# Patient Record
Sex: Female | Born: 1990 | Race: Black or African American | Hispanic: No | Marital: Single | Smoking: Former smoker
Health system: Southern US, Community
[De-identification: ages and names within clinical notes are randomized; demographics above are authoritative.]

## PROBLEM LIST (undated history)

## (undated) ENCOUNTER — Inpatient Hospital Stay: Payer: Self-pay

## (undated) DIAGNOSIS — F32A Depression, unspecified: Secondary | ICD-10-CM

## (undated) DIAGNOSIS — E669 Obesity, unspecified: Secondary | ICD-10-CM

## (undated) DIAGNOSIS — G43909 Migraine, unspecified, not intractable, without status migrainosus: Secondary | ICD-10-CM

## (undated) DIAGNOSIS — Z803 Family history of malignant neoplasm of breast: Secondary | ICD-10-CM

## (undated) DIAGNOSIS — R000 Tachycardia, unspecified: Secondary | ICD-10-CM

## (undated) DIAGNOSIS — F419 Anxiety disorder, unspecified: Secondary | ICD-10-CM

## (undated) DIAGNOSIS — O99013 Anemia complicating pregnancy, third trimester: Secondary | ICD-10-CM

## (undated) HISTORY — PX: NO PAST SURGERIES: SHX2092

## (undated) HISTORY — DX: Migraine, unspecified, not intractable, without status migrainosus: G43.909

## (undated) HISTORY — DX: Depression, unspecified: F32.A

## (undated) HISTORY — DX: Anemia complicating pregnancy, third trimester: O99.013

## (undated) HISTORY — DX: Anxiety disorder, unspecified: F41.9

## (undated) HISTORY — PX: WISDOM TOOTH EXTRACTION: SHX21

## (undated) HISTORY — DX: Family history of malignant neoplasm of breast: Z80.3

---

## 2010-12-12 ENCOUNTER — Emergency Department (HOSPITAL_COMMUNITY)
Admission: EM | Admit: 2010-12-12 | Discharge: 2010-12-12 | Disposition: A | Discharge status: Home / Self Care | Payer: Self-pay | Attending: Emergency Medicine | Admitting: Emergency Medicine

## 2010-12-12 DIAGNOSIS — R0989 Other specified symptoms and signs involving the circulatory and respiratory systems: Secondary | ICD-10-CM | POA: Insufficient documentation

## 2010-12-12 DIAGNOSIS — R0609 Other forms of dyspnea: Secondary | ICD-10-CM | POA: Insufficient documentation

## 2010-12-12 DIAGNOSIS — R0789 Other chest pain: Secondary | ICD-10-CM | POA: Insufficient documentation

## 2010-12-12 DIAGNOSIS — J45901 Unspecified asthma with (acute) exacerbation: Secondary | ICD-10-CM | POA: Insufficient documentation

## 2011-01-18 ENCOUNTER — Emergency Department (HOSPITAL_COMMUNITY)
Admission: EM | Admit: 2011-01-18 | Discharge: 2011-01-18 | Disposition: A | Discharge status: Home / Self Care | Payer: Self-pay | Attending: Emergency Medicine | Admitting: Emergency Medicine

## 2011-01-18 ENCOUNTER — Emergency Department (HOSPITAL_COMMUNITY): Payer: Self-pay

## 2011-01-18 DIAGNOSIS — R0789 Other chest pain: Secondary | ICD-10-CM | POA: Insufficient documentation

## 2011-01-18 DIAGNOSIS — R5381 Other malaise: Secondary | ICD-10-CM | POA: Insufficient documentation

## 2011-01-18 DIAGNOSIS — R0602 Shortness of breath: Secondary | ICD-10-CM | POA: Insufficient documentation

## 2011-01-18 DIAGNOSIS — J45901 Unspecified asthma with (acute) exacerbation: Secondary | ICD-10-CM | POA: Insufficient documentation

## 2011-02-18 ENCOUNTER — Emergency Department (HOSPITAL_COMMUNITY)
Admission: EM | Admit: 2011-02-18 | Discharge: 2011-02-18 | Disposition: A | Discharge status: Home / Self Care | Payer: Self-pay | Attending: Emergency Medicine | Admitting: Emergency Medicine

## 2011-02-18 DIAGNOSIS — R0602 Shortness of breath: Secondary | ICD-10-CM | POA: Insufficient documentation

## 2011-02-18 DIAGNOSIS — R0789 Other chest pain: Secondary | ICD-10-CM | POA: Insufficient documentation

## 2011-02-18 DIAGNOSIS — J45901 Unspecified asthma with (acute) exacerbation: Secondary | ICD-10-CM | POA: Insufficient documentation

## 2011-03-18 ENCOUNTER — Emergency Department (HOSPITAL_COMMUNITY)
Admission: EM | Admit: 2011-03-18 | Discharge: 2011-03-18 | Disposition: A | Discharge status: Home / Self Care | Payer: Self-pay | Attending: Emergency Medicine | Admitting: Emergency Medicine

## 2011-03-18 ENCOUNTER — Encounter: Payer: Self-pay | Admitting: *Deleted

## 2011-03-18 ENCOUNTER — Emergency Department (HOSPITAL_COMMUNITY): Payer: Self-pay

## 2011-03-18 DIAGNOSIS — R079 Chest pain, unspecified: Secondary | ICD-10-CM | POA: Insufficient documentation

## 2011-03-18 DIAGNOSIS — R0602 Shortness of breath: Secondary | ICD-10-CM | POA: Insufficient documentation

## 2011-03-18 DIAGNOSIS — J45909 Unspecified asthma, uncomplicated: Secondary | ICD-10-CM | POA: Insufficient documentation

## 2011-03-18 MED ORDER — PREDNISONE 20 MG PO TABS: 40.0000 mg | ORAL_TABLET | Freq: Every day | ORAL | Status: AC

## 2011-03-18 MED ORDER — ALBUTEROL SULFATE (5 MG/ML) 0.5% IN NEBU
2.5000 mg | INHALATION_SOLUTION | Freq: Once | RESPIRATORY_TRACT | Status: AC
  Administered 2011-03-18: 2.5 mg via RESPIRATORY_TRACT

## 2011-03-18 MED ORDER — PREDNISONE 20 MG PO TABS
60.0000 mg | ORAL_TABLET | Freq: Once | ORAL | Status: AC
  Administered 2011-03-18: 60 mg via ORAL
  Filled 2011-03-18: qty 3

## 2011-03-18 MED ORDER — IPRATROPIUM BROMIDE 0.02 % IN SOLN
0.5000 mg | Freq: Once | RESPIRATORY_TRACT | Status: AC
  Administered 2011-03-18: 0.5 mg via RESPIRATORY_TRACT

## 2011-03-18 MED ORDER — ALBUTEROL SULFATE (5 MG/ML) 0.5% IN NEBU
INHALATION_SOLUTION | RESPIRATORY_TRACT | Status: AC
  Filled 2011-03-18: qty 1

## 2011-03-18 MED ORDER — ALBUTEROL SULFATE (5 MG/ML) 0.5% IN NEBU
5.0000 mg | INHALATION_SOLUTION | Freq: Once | RESPIRATORY_TRACT | Status: AC
  Administered 2011-03-18: 5 mg via RESPIRATORY_TRACT
  Filled 2011-03-18: qty 1

## 2011-03-18 MED ORDER — IPRATROPIUM BROMIDE 0.02 % IN SOLN
RESPIRATORY_TRACT | Status: AC
  Filled 2011-03-18: qty 2.5

## 2011-03-18 MED ORDER — ALBUTEROL SULFATE (5 MG/ML) 0.5% IN NEBU
5.0000 mg | INHALATION_SOLUTION | Freq: Once | RESPIRATORY_TRACT | Status: AC
  Administered 2011-03-18: 5 mg via RESPIRATORY_TRACT
  Filled 2011-03-18: qty 0.5

## 2011-03-18 MED ORDER — IPRATROPIUM BROMIDE 0.02 % IN SOLN
0.5000 mg | Freq: Once | RESPIRATORY_TRACT | Status: AC
  Administered 2011-03-18: 0.5 mg via RESPIRATORY_TRACT
  Filled 2011-03-18: qty 2.5

## 2011-03-18 MED ORDER — ALBUTEROL SULFATE HFA 108 (90 BASE) MCG/ACT IN AERS
2.0000 | INHALATION_SPRAY | RESPIRATORY_TRACT | Status: DC | PRN
  Administered 2011-03-18: 2 via RESPIRATORY_TRACT
  Filled 2011-03-18: qty 6.7

## 2011-03-18 NOTE — ED Notes (Signed)
Breathing improved, pt to waiting room

## 2011-03-18 NOTE — ED Provider Notes (Signed)
History     CSN: [id number] Arrival date & time: 03/18/2011  2:49 AM   First MD Initiated Contact with Patient 03/18/11 [phone]      Chief Complaint  Patient presents with  . Asthma    (Consider location/radiation/quality/duration/timing/severity/associated sxs/prior treatment) Patient is a 20 y.o. female presenting with asthma. The history is provided by the patient.  Asthma This is a recurrent problem. The current episode started 1 to 2 hours ago. The problem occurs constantly. The problem has been gradually worsening. Associated symptoms include chest pain and shortness of breath. The symptoms are aggravated by walking and exertion. The symptoms are relieved by nothing (ran out of inhaler at home.). She has tried nothing for the symptoms.    Past Medical History  Diagnosis Date  . Asthma     History reviewed. No pertinent past surgical history.  History reviewed. No pertinent family history.  History  Substance Use Topics  . Smoking status: Never Smoker   . Smokeless tobacco: Not on file  . Alcohol Use: No    OB History    Grav Para Term Preterm Abortions TAB SAB Ect Mult Living                  Review of Systems  Respiratory: Positive for shortness of breath.   Cardiovascular: Positive for chest pain.  All other systems reviewed and are negative.    Allergies  Review of patient's allergies indicates no known allergies.  Home Medications  No current outpatient prescriptions on file.  BP 115/72  Pulse 80  Temp(Src) 98.6 F (37 C) (Oral)  Resp 16  SpO2 100%  LMP 03/15/2011  Physical Exam  Nursing note and vitals reviewed. Constitutional: She is oriented to person, place, and time. She appears well-developed and well-nourished. No distress.  HENT:  Head: Normocephalic and atraumatic.  Right Ear: Tympanic membrane and ear canal normal.  Left Ear: Tympanic membrane and ear canal normal.  Eyes: EOM are normal. Pupils are equal, round, and reactive to  light.  Cardiovascular: Normal rate, regular rhythm, normal heart sounds and intact distal pulses.  Exam reveals no friction rub.   No murmur heard. Pulmonary/Chest: Effort normal. She has wheezes. She has no rales.  Abdominal: Soft. Bowel sounds are normal. She exhibits no distension. There is no tenderness. There is no rebound and no guarding.  Musculoskeletal: Normal range of motion. She exhibits no tenderness.       No edema  Neurological: She is alert and oriented to person, place, and time. No cranial nerve deficit.  Skin: Skin is warm and dry. No rash noted.  Psychiatric: She has a normal mood and affect. Her behavior is normal.    ED Course  Procedures (including critical care time)  Labs Reviewed - No data to display Dg Chest 2 View  03/18/2011  *RADIOLOGY REPORT*  Clinical Data: Shortness of breath, wheezing.  CHEST - 2 VIEW  Comparison: 01/18/2011  Findings: Lungs are clear. No pleural effusion or pneumothorax. The cardiomediastinal contours are within normal limits. The visualized bones and soft tissues are without significant appreciable abnormality.  IMPRESSION: No acute cardiopulmonary process.  Original Report Authenticated By: Waneta Martins, M.D.     1. Asthma       MDM   Pt with typical asthma exacerbation  symptoms.  No infectious sx, productive cough or other complaints.  Wheezing on exam.  will give steroids, albuterol/atrovent and recheck.  6:18 AM CXR wnl.  On re-eval pt feeling much  better.  Will d/c home.       Gwyneth Sprout, MD 03/18/11 [phone]

## 2011-03-18 NOTE — ED Notes (Signed)
Pt in c/o asthma attack x30 min, pt states she is out of home inhailer

## 2011-07-03 ENCOUNTER — Emergency Department (HOSPITAL_COMMUNITY)
Admission: EM | Admit: 2011-07-03 | Discharge: 2011-07-04 | Disposition: A | Discharge status: Home Health | Payer: Self-pay | Attending: Emergency Medicine | Admitting: Emergency Medicine

## 2011-07-03 DIAGNOSIS — R059 Cough, unspecified: Secondary | ICD-10-CM | POA: Insufficient documentation

## 2011-07-03 DIAGNOSIS — R0602 Shortness of breath: Secondary | ICD-10-CM | POA: Insufficient documentation

## 2011-07-03 DIAGNOSIS — R05 Cough: Secondary | ICD-10-CM | POA: Insufficient documentation

## 2011-07-03 DIAGNOSIS — J45901 Unspecified asthma with (acute) exacerbation: Secondary | ICD-10-CM | POA: Insufficient documentation

## 2011-07-03 DIAGNOSIS — R079 Chest pain, unspecified: Secondary | ICD-10-CM | POA: Insufficient documentation

## 2011-07-04 ENCOUNTER — Encounter (HOSPITAL_COMMUNITY): Payer: Self-pay | Admitting: Emergency Medicine

## 2011-07-04 MED ORDER — PREDNISONE 20 MG PO TABS: ORAL_TABLET | ORAL | Status: AC

## 2011-07-04 MED ORDER — PREDNISONE 20 MG PO TABS
60.0000 mg | ORAL_TABLET | Freq: Once | ORAL | Status: AC
  Administered 2011-07-04: 60 mg via ORAL
  Filled 2011-07-04: qty 3

## 2011-07-04 MED ORDER — IPRATROPIUM BROMIDE 0.02 % IN SOLN
0.5000 mg | Freq: Once | RESPIRATORY_TRACT | Status: AC
  Administered 2011-07-04: 0.5 mg via RESPIRATORY_TRACT
  Filled 2011-07-04: qty 2.5

## 2011-07-04 MED ORDER — ALBUTEROL SULFATE (5 MG/ML) 0.5% IN NEBU
2.5000 mg | INHALATION_SOLUTION | Freq: Once | RESPIRATORY_TRACT | Status: AC
  Administered 2011-07-04: 2.5 mg via RESPIRATORY_TRACT
  Filled 2011-07-04: qty 0.5

## 2011-07-04 MED ORDER — OXYCODONE HCL 5 MG PO TABS
5.0000 mg | ORAL_TABLET | Freq: Once | ORAL | Status: AC
  Administered 2011-07-04: 5 mg via ORAL
  Filled 2011-07-04: qty 1

## 2011-07-04 MED ORDER — TRAMADOL HCL 50 MG PO TABS: 50.0000 mg | ORAL_TABLET | Freq: Four times a day (QID) | ORAL | Status: AC | PRN

## 2011-07-04 MED ORDER — ACETAMINOPHEN 325 MG PO TABS
650.0000 mg | ORAL_TABLET | Freq: Once | ORAL | Status: AC
  Administered 2011-07-04: 650 mg via ORAL
  Filled 2011-07-04: qty 2

## 2011-07-04 MED ORDER — ALBUTEROL SULFATE HFA 108 (90 BASE) MCG/ACT IN AERS
2.0000 | INHALATION_SPRAY | RESPIRATORY_TRACT | Status: DC | PRN
  Administered 2011-07-04: 2 via RESPIRATORY_TRACT
  Filled 2011-07-04: qty 6.7

## 2011-07-04 NOTE — ED Notes (Addendum)
Pt presented to the ER with c/o difficulty of breathing, states has Hx of Asthma, noted wheezing in the upper bilateral lobes, pt also reports pain the lowe back, ribs and states "it hurts to move anyway" Pt able to communicate, noted productive cough, pt states recent cold

## 2011-07-04 NOTE — ED Provider Notes (Signed)
History     CSN: [id number]  Arrival date & time 07/03/11  2350   First MD Initiated Contact with Patient 07/04/11 [phone]      Chief Complaint  Patient presents with  . Asthma  . Shortness of Breath  . Respiratory Distress    (Consider location/radiation/quality/duration/timing/severity/associated sxs/prior treatment) HPI Comments: Patient with a history of asthma comes in tonight with shortness of breath, difficulty moving her chest wall to breathe, stating it hurts.  She states she used an albuterol inhaler once yesterday.  She has not been on steroids in a long time.  Denies fever, trauma, previous intubations.  He does not have a local physician and she was just discharged from the service  Patient is a 21 y.o. female presenting with asthma and shortness of breath. The history is provided by the patient.  Asthma This is a recurrent problem. The current episode started today. The problem occurs constantly. The problem has been gradually worsening. Associated symptoms include chest pain and coughing. Pertinent negatives include no congestion or weakness.  Shortness of Breath  Associated symptoms include chest pain, cough, shortness of breath and wheezing. Pertinent negatives include no rhinorrhea. Her past medical history is significant for asthma.    Past Medical History  Diagnosis Date  . Asthma     History reviewed. No pertinent past surgical history.  History reviewed. No pertinent family history.  History  Substance Use Topics  . Smoking status: Never Smoker   . Smokeless tobacco: Not on file  . Alcohol Use: No    OB History    Grav Para Term Preterm Abortions TAB SAB Ect Mult Living                  Review of Systems  HENT: Negative for congestion and rhinorrhea.   Respiratory: Positive for cough, shortness of breath and wheezing.   Cardiovascular: Positive for chest pain.  Neurological: Negative for dizziness and weakness.    Allergies  Shellfish  allergy  Home Medications   Current Outpatient Rx  Name Route Sig Dispense Refill  . ALBUTEROL SULFATE HFA 108 (90 BASE) MCG/ACT IN AERS Inhalation Inhale 2 puffs into the lungs every 6 (six) hours as needed.      Marland Kitchen PREDNISONE 20 MG PO TABS  3 Tabs PO Days 1-3, then 2 tabs PO Days 4-6, then 1 tab PO Day 7-9, then Half Tab PO Day 10-12 20 tablet 0  . TRAMADOL HCL 50 MG PO TABS Oral Take 1 tablet (50 mg total) by mouth every 6 (six) hours as needed for pain. 15 tablet 0    BP 126/71  Pulse 95  Temp(Src) 98 F (36.7 C) (Oral)  Resp 16  Ht 5\' 4"  (1.626 m)  Wt 197 lb 6.4 oz (89.54 kg)  BMI 33.88 kg/m2  SpO2 96%  LMP 07/02/2011  Physical Exam  Constitutional: She appears well-developed and well-nourished.  HENT:  Head: Normocephalic.  Eyes: Pupils are equal, round, and reactive to light.  Cardiovascular: Normal rate.   Pulmonary/Chest: Accessory muscle usage present. No respiratory distress. She has decreased breath sounds.       Moving very little air high pitched end expiratory wheeze in the right upper lobe    ED Course  Procedures (including critical care time)  Labs Reviewed - No data to display No results found.   1. Asthma exacerbation     2:38 AM patient reassessed.  She is moving more air.  Do not hear a wheezing at  this time.  She still having significant chest discomfort with deep inspiration.  That was not relieved with Tylenol.  We'll try giving her 5 mg oxycodone and reassess with an additional albuterol treatment  MDM  Asthma exacerbation 6:01 AM patient able to fill her lungs are completely no longer using.  Will discharge him with an albuterol inhaler prescription for steroids and something for pain       Arman Filter, NP 07/04/11 [phone]

## 2011-07-04 NOTE — ED Provider Notes (Signed)
Medical screening examination/treatment/procedure(s) were performed by non-physician practitioner and as supervising physician I was immediately available for consultation/collaboration.  Chawn Spraggins R. Bretton Tandy, MD 07/04/11 0743 

## 2011-07-04 NOTE — ED Notes (Signed)
Pt given Albuterol MDI and given instruction in use with return demonstration.

## 2011-07-04 NOTE — Discharge Instructions (Signed)
Asthma Attack Prevention HOW CAN ASTHMA BE PREVENTED? Currently, there is no way to prevent asthma from starting. However, you can take steps to control the disease and prevent its symptoms after you have been diagnosed. Learn about your asthma and how to control it. Take an active role to control your asthma by working with your caregiver to create and follow an asthma action plan. An asthma action plan guides you in taking your medicines properly, avoiding factors that make your asthma worse, tracking your level of asthma control, responding to worsening asthma, and seeking emergency care when needed. To track your asthma, keep records of your symptoms, check your peak flow number using a peak flow meter (handheld device that shows how well air moves out of your lungs), and get regular asthma checkups.  Other ways to prevent asthma attacks include:  Use medicines as your caregiver directs.   Identify and avoid things that make your asthma worse (as much as you can).   Keep track of your asthma symptoms and level of control.   Get regular checkups for your asthma.   With your caregiver, write a detailed plan for taking medicines and managing an asthma attack. Then be sure to follow your action plan. Asthma is an ongoing condition that needs regular monitoring and treatment.   Identify and avoid asthma triggers. A number of outdoor allergens and irritants (pollen, mold, cold air, air pollution) can trigger asthma attacks. Find out what causes or makes your asthma worse, and take steps to avoid those triggers (see below).   Monitor your breathing. Learn to recognize warning signs of an attack, such as slight coughing, wheezing or shortness of breath. However, your lung function may already decrease before you notice any signs or symptoms, so regularly measure and record your peak airflow with a home peak flow meter.   Identify and treat attacks early. If you act quickly, you're less likely to have  a severe attack. You will also need less medicine to control your symptoms. When your peak flow measurements decrease and alert you to an upcoming attack, take your medicine as instructed, and immediately stop any activity that may have triggered the attack. If your symptoms do not improve, get medical help.   Pay attention to increasing quick-relief inhaler use. If you find yourself relying on your quick-relief inhaler (such as albuterol), your asthma is not under control. See your caregiver about adjusting your treatment.  IDENTIFY AND CONTROL FACTORS THAT MAKE YOUR ASTHMA WORSE A number of common things can set off or make your asthma symptoms worse (asthma triggers). Keep track of your asthma symptoms for several weeks, detailing all the environmental and emotional factors that are linked with your asthma. When you have an asthma attack, go back to your asthma diary to see which factor, or combination of factors, might have contributed to it. Once you know what these factors are, you can take steps to control many of them.  Allergies: If you have allergies and asthma, it is important to take asthma prevention steps at home. Asthma attacks (worsening of asthma symptoms) can be triggered by allergies, which can cause temporary increased inflammation of your airways. Minimizing contact with the substance to which you are allergic will help prevent an asthma attack. Animal Dander:   Some people are allergic to the flakes of skin or dried saliva from animals with fur or feathers. Keep these pets out of your home.   If you can't keep a pet outdoors, keep the   pet out of your bedroom and other sleeping areas at all times, and keep the door closed.   Remove carpets and furniture covered with cloth from your home. If that is not possible, keep the pet away from fabric-covered furniture and carpets.  Dust Mites:  Many people with asthma are allergic to dust mites. Dust mites are tiny bugs that are found in  every home, in mattresses, pillows, carpets, fabric-covered furniture, bedcovers, clothes, stuffed toys, fabric, and other fabric-covered items.   Cover your mattress in a special dust-proof cover.   Cover your pillow in a special dust-proof cover, or wash the pillow each week in hot water. Water must be hotter than 130 F to kill dust mites. Cold or warm water used with detergent and bleach can also be effective.   Wash the sheets and blankets on your bed each week in hot water.   Try not to sleep or lie on cloth-covered cushions.   Call ahead when traveling and ask for a smoke-free hotel room. Bring your own bedding and pillows, in case the hotel only supplies feather pillows and down comforters, which may contain dust mites and cause asthma symptoms.   Remove carpets from your bedroom and those laid on concrete, if you can.   Keep stuffed toys out of the bed, or wash the toys weekly in hot water or cooler water with detergent and bleach.  Cockroaches:  Many people with asthma are allergic to the droppings and remains of cockroaches.   Keep food and garbage in closed containers. Never leave food out.   Use poison baits, traps, powders, gels, or paste (for example, boric acid).   If a spray is used to kill cockroaches, stay out of the room until the odor goes away.  Indoor Mold:  Fix leaky faucets, pipes, or other sources of water that have mold around them.   Clean moldy surfaces with a cleaner that has bleach in it.  Pollen and Outdoor Mold:  When pollen or mold spore counts are high, try to keep your windows closed.   Stay indoors with windows closed from late morning to afternoon, if you can. Pollen and some mold spore counts are highest at that time.   Ask your caregiver whether you need to take or increase anti-inflammatory medicine before your allergy season starts.  Irritants:   Tobacco smoke is an irritant. If you smoke, ask your caregiver how you can quit. Ask family  members to quit smoking, too. Do not allow smoking in your home or car.   If possible, do not use a wood-burning stove, kerosene heater, or fireplace. Minimize exposure to all sources of smoke, including incense, candles, fires, and fireworks.   Try to stay away from strong odors and sprays, such as perfume, talcum powder, hair spray, and paints.   Decrease humidity in your home and use an indoor air cleaning device. Reduce indoor humidity to below 60 percent. Dehumidifiers or central air conditioners can do this.   Try to have someone else vacuum for you once or twice a week, if you can. Stay out of rooms while they are being vacuumed and for a short while afterward.   If you vacuum, use a dust mask from a hardware store, a double-layered or microfilter vacuum cleaner bag, or a vacuum cleaner with a HEPA filter.   Sulfites in foods and beverages can be irritants. Do not drink beer or wine, or eat dried fruit, processed potatoes, or shrimp if they cause asthma   symptoms.   Cold air can trigger an asthma attack. Cover your nose and mouth with a scarf on cold or windy days.   Several health conditions can make asthma more difficult to manage, including runny nose, sinus infections, reflux disease, psychological stress, and sleep apnea. Your caregiver will treat these conditions, as well.   Avoid close contact with people who have a cold or the flu, since your asthma symptoms may get worse if you catch the infection from them. Wash your hands thoroughly after touching items that may have been handled by people with a respiratory infection.   Get a flu shot every year to protect against the flu virus, which often makes asthma worse for days or weeks. Also get a pneumonia shot once every five to 10 years.  Drugs:  Aspirin and other painkillers can cause asthma attacks. 10% to 20% of people with asthma have sensitivity to aspirin or a group of painkillers called non-steroidal anti-inflammatory drugs  (NSAIDS), such as ibuprofen and naproxen. These drugs are used to treat pain and reduce fevers. Asthma attacks caused by any of these medicines can be severe and even fatal. These drugs must be avoided in people who have known aspirin sensitive asthma. Products with acetaminophen are considered safe for people who have asthma. It is important that people with aspirin sensitivity read labels of all over-the-counter drugs used to treat pain, colds, coughs, and fever.   Beta blockers and ACE inhibitors are other drugs which you should discuss with your caregiver, in relation to your asthma.  ALLERGY SKIN TESTING  Ask your asthma caregiver about allergy skin testing or blood testing (RAST test) to identify the allergens to which you are sensitive. If you are found to have allergies, allergy shots (immunotherapy) for asthma may help prevent future allergies and asthma. With allergy shots, small doses of allergens (substances to which you are allergic) are injected under your skin on a regular schedule. Over a period of time, your body may become used to the allergen and less responsive with asthma symptoms. You can also take measures to minimize your exposure to those allergens. EXERCISE  If you have exercise-induced asthma, or are planning vigorous exercise, or exercise in cold, humid, or dry environments, prevent exercise-induced asthma by following your caregiver's advice regarding asthma treatment before exercising. Document Released: 04/02/2009 Document Revised: 04/03/2011 Document Reviewed: 04/02/2009 [facility] Patient Information 2012 [place], [place]. Use the supplied inhaler every 6 hours 2 puffs for 2 days and then as needed.  Please continue taking prednisone as directed until completed, even given a prescription for Ultram that he can use for pain

## 2011-08-11 ENCOUNTER — Emergency Department (HOSPITAL_COMMUNITY)
Admission: EM | Admit: 2011-08-11 | Discharge: 2011-08-12 | Disposition: A | Discharge status: Home / Self Care | Payer: Self-pay | Attending: Emergency Medicine | Admitting: Emergency Medicine

## 2011-08-11 ENCOUNTER — Encounter (HOSPITAL_COMMUNITY): Payer: Self-pay | Admitting: Emergency Medicine

## 2011-08-11 DIAGNOSIS — R05 Cough: Secondary | ICD-10-CM | POA: Insufficient documentation

## 2011-08-11 DIAGNOSIS — R070 Pain in throat: Secondary | ICD-10-CM | POA: Insufficient documentation

## 2011-08-11 DIAGNOSIS — J069 Acute upper respiratory infection, unspecified: Secondary | ICD-10-CM | POA: Insufficient documentation

## 2011-08-11 DIAGNOSIS — R059 Cough, unspecified: Secondary | ICD-10-CM | POA: Insufficient documentation

## 2011-08-11 DIAGNOSIS — R509 Fever, unspecified: Secondary | ICD-10-CM | POA: Insufficient documentation

## 2011-08-11 DIAGNOSIS — J45901 Unspecified asthma with (acute) exacerbation: Secondary | ICD-10-CM | POA: Insufficient documentation

## 2011-08-11 NOTE — ED Notes (Signed)
Pt states she has been sick since Friday, ran a fever on Saturday, and today has been having problems with her asthma  Pt states her inhaler is empty at home

## 2011-08-12 ENCOUNTER — Emergency Department (HOSPITAL_COMMUNITY): Payer: Self-pay

## 2011-08-12 MED ORDER — PREDNISONE 20 MG PO TABS
60.0000 mg | ORAL_TABLET | Freq: Once | ORAL | Status: AC
  Administered 2011-08-12: 60 mg via ORAL
  Filled 2011-08-12: qty 3

## 2011-08-12 MED ORDER — PREDNISONE 10 MG PO TABS: 20.0000 mg | ORAL_TABLET | Freq: Every day | ORAL | Status: DC

## 2011-08-12 MED ORDER — ALBUTEROL SULFATE (5 MG/ML) 0.5% IN NEBU
5.0000 mg | INHALATION_SOLUTION | Freq: Once | RESPIRATORY_TRACT | Status: AC
  Administered 2011-08-12: 5 mg via RESPIRATORY_TRACT
  Filled 2011-08-12: qty 1

## 2011-08-12 MED ORDER — ALBUTEROL SULFATE HFA 108 (90 BASE) MCG/ACT IN AERS: 1.0000 | INHALATION_SPRAY | Freq: Four times a day (QID) | RESPIRATORY_TRACT | Status: DC | PRN

## 2011-08-12 MED ORDER — ALBUTEROL SULFATE HFA 108 (90 BASE) MCG/ACT IN AERS
2.0000 | INHALATION_SPRAY | RESPIRATORY_TRACT | Status: DC | PRN
  Administered 2011-08-12: 2 via RESPIRATORY_TRACT
  Filled 2011-08-12: qty 6.7

## 2011-08-12 NOTE — ED Provider Notes (Signed)
History     CSN: [id number]  Arrival date & time 08/11/11  2328   First MD Initiated Contact with Patient 08/11/11 2359      Chief Complaint  Patient presents with  . Asthma    (Consider location/radiation/quality/duration/timing/severity/associated sxs/prior treatment) HPI History provided by the patient. She lives locally does not have a primary care physician. Having on-off low-grade fevers the last few days with increasing cough and wheezing. She is a nonsmoker. She has ran out of her inhaler and is requesting referral to primary care clinic and inhaler tonight. Some sore throat. No nausea vomiting or diarrhea. No abdominal pain. No back pain or chest pain. Symptoms mild/moderate in severity. No known triggers. Patient denies history of admissions or intubation for asthma in the past. Past Medical History  Diagnosis Date  . Asthma     History reviewed. No pertinent past surgical history.  Family History  Problem Relation Age of Onset  . Cancer Other   . Diabetes Other     History  Substance Use Topics  . Smoking status: Never Smoker   . Smokeless tobacco: Not on file  . Alcohol Use: No    OB History    Grav Para Term Preterm Abortions TAB SAB Ect Mult Living                  Review of Systems  Constitutional: Positive for fever. Negative for chills.  HENT: Negative for neck pain and neck stiffness.   Eyes: Negative for pain.  Respiratory: Positive for cough and wheezing.   Cardiovascular: Negative for chest pain.  Gastrointestinal: Negative for abdominal pain.  Genitourinary: Negative for dysuria.  Musculoskeletal: Negative for back pain.  Skin: Negative for rash.  Neurological: Negative for headaches.  All other systems reviewed and are negative.    Allergies  Shellfish allergy  Home Medications   Current Outpatient Rx  Name Route Sig Dispense Refill  . ALBUTEROL SULFATE HFA 108 (90 BASE) MCG/ACT IN AERS Inhalation Inhale 2 puffs into the lungs  every 6 (six) hours as needed.      . IBUPROFEN 200 MG PO TABS Oral Take 200 mg by mouth every 6 (six) hours as needed.      BP 133/85  Pulse 107  Temp(Src) 99.2 F (37.3 C) (Oral)  Resp 20  SpO2 98%  LMP 07/21/2011  Physical Exam  Constitutional: She is oriented to person, place, and time. She appears well-developed and well-nourished.  HENT:  Head: Normocephalic and atraumatic.  Eyes: Conjunctivae and EOM are normal. Pupils are equal, round, and reactive to light.  Neck: Trachea normal. Neck supple. No thyromegaly present.  Cardiovascular: Normal rate, regular rhythm, S1 normal, S2 normal and normal pulses.     No systolic murmur is present   No diastolic murmur is present  Pulses:      Radial pulses are 2+ on the right side, and 2+ on the left side.  Pulmonary/Chest: She has no rhonchi. She has no rales. She exhibits no tenderness.       Good air movement with mild expiratory wheezes bilaterally. No respiratory distress.  Abdominal: Soft. Normal appearance and bowel sounds are normal. There is no tenderness. There is no CVA tenderness and negative Murphy's sign.  Musculoskeletal:       BLE:s Calves nontender, no cords or erythema, negative Homans sign  Neurological: She is alert and oriented to person, place, and time. She has normal strength. No cranial nerve deficit or sensory deficit. GCS eye  subscore is 4. GCS verbal subscore is 5. GCS motor subscore is 6.  Skin: Skin is warm and dry. No rash noted. She is not diaphoretic.  Psychiatric: Her speech is normal.       Cooperative and appropriate    ED Course  Procedures (including critical care time)  Labs Reviewed - No data to display Dg Chest 2 View  08/12/2011  *RADIOLOGY REPORT*  Clinical Data: Shortness of breath; history of asthma.  CHEST - 2 VIEW  Comparison: Chest radiograph performed 05/19/2010  Findings: The lungs are well-aerated and clear.  There is no evidence of focal opacification, pleural effusion or  pneumothorax.  The heart is normal in size; the mediastinal contour is within normal limits.  No acute osseous abnormalities are seen.  IMPRESSION: No acute cardiopulmonary process seen.  Original Report Authenticated By: Tonia Ghent, M.D.    Albuterol treatment and prednisone provided. Chest x-ray obtained and reviewed as above. Symptoms improving and NAD on recheck with wheezing resolved. Patient stable for discharge home.   MDM   Asthma attack with history of same. Has associated fever and likely URI. Improve in ED in stable for discharge home with URI precautions, albuterol inhaler and primary care referral        Sunnie Nielsen, MD 08/12/11 [phone]

## 2011-08-12 NOTE — ED Notes (Signed)
Pt c/o SOB and chest tightness x3 days. Pt has hx of asthma. Pt states she was recently sick with cold-like symptoms. Pt states sitting down and relaxing is her only relief to these symptoms.

## 2011-08-12 NOTE — Discharge Instructions (Signed)
Upper Respiratory Infection, Adult  An upper respiratory infection (URI) is also sometimes known as the common cold. The upper respiratory tract includes the nose, sinuses, throat, trachea, and bronchi. Bronchi are the airways leading to the lungs. Most people improve within 1 week, but symptoms can last up to 2 weeks. A residual cough may last even longer.  CAUSES  Many different viruses can infect the tissues lining the upper respiratory tract. The tissues become irritated and inflamed and often become very moist. Mucus production is also common. A cold is contagious. You can easily spread the virus to others by oral contact. This includes kissing, sharing a glass, coughing, or sneezing. Touching your mouth or nose and then touching a surface, which is then touched by another person, can also spread the virus.  SYMPTOMS  Symptoms typically develop 1 to 3 days after you come in contact with a cold virus. Symptoms vary from person to person. They may include:  Runny nose.  Sneezing.  Nasal congestion.  Sinus irritation.  Sore throat.  Loss of voice (laryngitis).  Cough.  Fatigue.  Muscle aches.  Loss of appetite.  Headache.  Low-grade fever.  DIAGNOSIS  You might diagnose your own cold based on familiar symptoms, since most people get a cold 2 to 3 times a year. Your caregiver can confirm this based on your exam. Most importantly, your caregiver can check that your symptoms are not due to another disease such as strep throat, sinusitis, pneumonia, asthma, or epiglottitis. Blood tests, throat tests, and X-rays are not necessary to diagnose a common cold, but they may sometimes be helpful in excluding other more serious diseases. Your caregiver will decide if any further tests are required.  RISKS AND COMPLICATIONS  You may be at risk for a more severe case of the common cold if you smoke cigarettes, have chronic heart disease (such as heart failure) or lung disease (such as asthma), or if you  have a weakened immune system. The very young and very old are also at risk for more serious infections. Bacterial sinusitis, middle ear infections, and bacterial pneumonia can complicate the common cold. The common cold can worsen asthma and chronic obstructive pulmonary disease (COPD). Sometimes, these complications can require emergency medical care and may be life-threatening.  PREVENTION  The best way to protect against getting a cold is to practice good hygiene. Avoid oral or hand contact with people with cold symptoms. Wash your hands often if contact occurs. There is no clear evidence that vitamin C, vitamin E, echinacea, or exercise reduces the chance of developing a cold. However, it is always recommended to get plenty of rest and practice good nutrition.  TREATMENT  Treatment is directed at relieving symptoms. There is no cure. Antibiotics are not effective, because the infection is caused by a virus, not by bacteria. Treatment may include:  Increased fluid intake. Sports drinks offer valuable electrolytes, sugars, and fluids.  Breathing heated mist or steam (vaporizer or shower).  Eating chicken soup or other clear broths, and maintaining good nutrition.  Getting plenty of rest.  Using gargles or lozenges for comfort.  Controlling fevers with ibuprofen or acetaminophen as directed by your caregiver.  Increasing usage of your inhaler if you have asthma.  Zinc gel and zinc lozenges, taken in the first 24 hours of the common cold, can shorten the duration and lessen the severity of symptoms. Pain medicines may help with fever, muscle aches, and throat pain. A variety of non-prescription medicines are available  to treat congestion and runny nose. Your caregiver can make recommendations and may suggest nasal or lung inhalers for other symptoms.  HOME CARE INSTRUCTIONS  Only take over-the-counter or prescription medicines for pain, discomfort, or fever as directed by your caregiver.  Use a warm  mist humidifier or inhale steam from a shower to increase air moisture. This may keep secretions moist and make it easier to breathe.  Drink enough water and fluids to keep your urine clear or pale yellow.  Rest as needed.  Return to work when your temperature has returned to normal or as your caregiver advises. You may need to stay home longer to avoid infecting others. You can also use a face mask and careful hand washing to prevent spread of the virus.  SEEK MEDICAL CARE IF:  After the first few days, you feel you are getting worse rather than better.  You need your caregiver's advice about medicines to control symptoms.  You develop chills, worsening shortness of breath, or brown or red sputum. These may be signs of pneumonia.  You develop yellow or brown nasal discharge or pain in the face, especially when you bend forward. These may be signs of sinusitis.  You develop a fever, swollen neck glands, pain with swallowing, or white areas in the back of your throat. These may be signs of strep throat.  SEEK IMMEDIATE MEDICAL CARE IF:  You have a fever.  You develop severe or persistent headache, ear pain, sinus pain, or chest pain.  You develop wheezing, a prolonged cough, cough up blood, or have a change in your usual mucus (if you have chronic lung disease).  You develop sore muscles or a stiff neck.   RESOURCE GUIDE  Dental Problems  Patients with Medicaid: [facility] [phone]                                           [phone] [address] Phone:  [phone]                                                  Phone:  [phone]  If unable to pay or uninsured, contact:  [facility] or [facility]. to become qualified for the adult dental clinic.  Chronic Pain Problems Contact [facility]  [phone] Patients need to be referred by their primary care doctor.  Insufficient Money for  Medicine Contact [employer]:  call "211" or [facility] [phone].  No Primary Care Doctor Call [facility]  [phone] Other agencies that provide inexpensive medical care    [facility]  [phone]    [facility]  [phone]    [facility]  [phone]    [facility]  [phone]    [facility]  [phone]    [facility]  [phone]

## 2012-07-28 ENCOUNTER — Emergency Department: Payer: Self-pay | Admitting: Emergency Medicine

## 2012-08-25 ENCOUNTER — Emergency Department: Payer: Self-pay | Admitting: Internal Medicine

## 2012-08-25 LAB — URINALYSIS, COMPLETE
Bilirubin,UR: NEGATIVE
Ph: 5 (ref 4.5–8.0)
RBC,UR: 23 /HPF (ref 0–5)
Specific Gravity: 1.013 (ref 1.003–1.030)
WBC UR: 314 /HPF (ref 0–5)

## 2013-01-30 ENCOUNTER — Emergency Department: Payer: Self-pay | Admitting: Emergency Medicine

## 2013-03-08 ENCOUNTER — Emergency Department: Payer: Self-pay | Admitting: Emergency Medicine

## 2013-04-14 ENCOUNTER — Emergency Department: Payer: Self-pay | Admitting: Emergency Medicine

## 2013-06-02 ENCOUNTER — Emergency Department: Payer: Self-pay | Admitting: Emergency Medicine

## 2013-06-02 LAB — CBC WITH DIFFERENTIAL/PLATELET
BASOS ABS: 0 10*3/uL (ref 0.0–0.1)
BASOS PCT: 0.6 %
EOS PCT: 1.8 %
Eosinophil #: 0.1 10*3/uL (ref 0.0–0.7)
HCT: 37.7 % (ref 35.0–47.0)
HGB: 12.6 g/dL (ref 12.0–16.0)
LYMPHS ABS: 1.8 10*3/uL (ref 1.0–3.6)
Lymphocyte %: 24.8 %
MCH: 28.4 pg (ref 26.0–34.0)
MCHC: 33.5 g/dL (ref 32.0–36.0)
MCV: 85 fL (ref 80–100)
MONO ABS: 0.3 x10 3/mm (ref 0.2–0.9)
Monocyte %: 4.6 %
NEUTROS ABS: 4.8 10*3/uL (ref 1.4–6.5)
NEUTROS PCT: 68.2 %
PLATELETS: 256 10*3/uL (ref 150–440)
RBC: 4.45 10*6/uL (ref 3.80–5.20)
RDW: 16.5 % — ABNORMAL HIGH (ref 11.5–14.5)
WBC: 7.1 10*3/uL (ref 3.6–11.0)

## 2013-06-02 LAB — URINALYSIS, COMPLETE
Bacteria: NONE SEEN
Bilirubin,UR: NEGATIVE
Blood: NEGATIVE
Glucose,UR: NEGATIVE mg/dL (ref 0–75)
Ketone: NEGATIVE
LEUKOCYTE ESTERASE: NEGATIVE
Nitrite: NEGATIVE
Ph: 5 (ref 4.5–8.0)
Protein: NEGATIVE
RBC,UR: <1 /HPF (ref 0–5)
SPECIFIC GRAVITY: 1.026 (ref 1.003–1.030)
WBC UR: <1 /HPF (ref 0–5)

## 2013-06-02 LAB — BASIC METABOLIC PANEL
ANION GAP: 6 — AB (ref 7–16)
BUN: 16 mg/dL (ref 7–18)
CHLORIDE: 105 mmol/L (ref 98–107)
Calcium, Total: 8.8 mg/dL (ref 8.5–10.1)
Co2: 26 mmol/L (ref 21–32)
Creatinine: 0.83 mg/dL (ref 0.60–1.30)
GLUCOSE: 93 mg/dL (ref 65–99)
OSMOLALITY: 275 (ref 275–301)
Potassium: 3.4 mmol/L — ABNORMAL LOW (ref 3.5–5.1)
Sodium: 137 mmol/L (ref 136–145)

## 2013-08-06 ENCOUNTER — Emergency Department: Payer: Self-pay | Admitting: Emergency Medicine

## 2013-08-22 ENCOUNTER — Emergency Department: Payer: Self-pay | Admitting: Emergency Medicine

## 2013-08-22 LAB — URINALYSIS, COMPLETE
BILIRUBIN, UR: NEGATIVE
Bacteria: NONE SEEN
Glucose,UR: NEGATIVE mg/dL (ref 0–75)
KETONE: NEGATIVE
Leukocyte Esterase: NEGATIVE
NITRITE: NEGATIVE
PH: 5 (ref 4.5–8.0)
Protein: NEGATIVE
RBC,UR: 4 /HPF (ref 0–5)
Specific Gravity: 1.026 (ref 1.003–1.030)
Squamous Epithelial: 2
WBC UR: 1 /HPF (ref 0–5)

## 2013-08-22 LAB — CBC
HCT: 34.5 % — ABNORMAL LOW (ref 35.0–47.0)
HGB: 11.6 g/dL — AB (ref 12.0–16.0)
MCH: 29 pg (ref 26.0–34.0)
MCHC: 33.5 g/dL (ref 32.0–36.0)
MCV: 87 fL (ref 80–100)
PLATELETS: 209 10*3/uL (ref 150–440)
RBC: 3.99 10*6/uL (ref 3.80–5.20)
RDW: 16 % — ABNORMAL HIGH (ref 11.5–14.5)
WBC: 8.6 10*3/uL (ref 3.6–11.0)

## 2013-08-22 LAB — HCG, QUANTITATIVE, PREGNANCY: Beta Hcg, Quant.: 83947 m[IU]/mL — ABNORMAL HIGH

## 2013-08-29 LAB — HM PAP SMEAR: HM Pap smear: NEGATIVE

## 2013-10-09 ENCOUNTER — Emergency Department: Payer: Self-pay | Admitting: Internal Medicine

## 2013-10-09 LAB — CBC
HCT: 33.3 % — ABNORMAL LOW (ref 35.0–47.0)
HGB: 11.2 g/dL — ABNORMAL LOW (ref 12.0–16.0)
MCH: 29.5 pg (ref 26.0–34.0)
MCHC: 33.6 g/dL (ref 32.0–36.0)
MCV: 88 fL (ref 80–100)
Platelet: 182 10*3/uL (ref 150–440)
RBC: 3.8 10*6/uL (ref 3.80–5.20)
RDW: 16.1 % — ABNORMAL HIGH (ref 11.5–14.5)
WBC: 7.3 10*3/uL (ref 3.6–11.0)

## 2013-10-09 LAB — BASIC METABOLIC PANEL
Anion Gap: 7 (ref 7–16)
BUN: 7 mg/dL (ref 7–18)
Calcium, Total: 8.8 mg/dL (ref 8.5–10.1)
Chloride: 105 mmol/L (ref 98–107)
Co2: 26 mmol/L (ref 21–32)
Creatinine: 0.66 mg/dL (ref 0.60–1.30)
EGFR (African American): >60
EGFR (Non-African Amer.): >60
Glucose: 78 mg/dL (ref 65–99)
Osmolality: 273 (ref 275–301)
Potassium: 3.5 mmol/L (ref 3.5–5.1)
SODIUM: 138 mmol/L (ref 136–145)

## 2013-12-16 ENCOUNTER — Emergency Department: Payer: Self-pay | Admitting: Student

## 2013-12-16 LAB — BASIC METABOLIC PANEL
Anion Gap: 8 (ref 7–16)
BUN: 8 mg/dL (ref 7–18)
CALCIUM: 8.1 mg/dL — AB (ref 8.5–10.1)
CHLORIDE: 107 mmol/L (ref 98–107)
CO2: 24 mmol/L (ref 21–32)
CREATININE: 0.72 mg/dL (ref 0.60–1.30)
EGFR (African American): >60
Glucose: 71 mg/dL (ref 65–99)
OSMOLALITY: 274 (ref 275–301)
POTASSIUM: 3.8 mmol/L (ref 3.5–5.1)
Sodium: 139 mmol/L (ref 136–145)

## 2013-12-16 LAB — CBC
HCT: 35.5 % (ref 35.0–47.0)
HGB: 11.7 g/dL — AB (ref 12.0–16.0)
MCH: 30.8 pg (ref 26.0–34.0)
MCHC: 33 g/dL (ref 32.0–36.0)
MCV: 93 fL (ref 80–100)
PLATELETS: 187 10*3/uL (ref 150–440)
RBC: 3.82 10*6/uL (ref 3.80–5.20)
RDW: 15.5 % — AB (ref 11.5–14.5)
WBC: 9.8 10*3/uL (ref 3.6–11.0)

## 2014-01-29 ENCOUNTER — Emergency Department: Payer: Self-pay | Admitting: Emergency Medicine

## 2014-01-29 LAB — CBC WITH DIFFERENTIAL/PLATELET
BASOS ABS: 0.1 10*3/uL (ref 0.0–0.1)
BASOS PCT: 0.5 %
EOS ABS: 0.1 10*3/uL (ref 0.0–0.7)
Eosinophil %: 0.8 %
HCT: 32.2 % — ABNORMAL LOW (ref 35.0–47.0)
HGB: 10.6 g/dL — AB (ref 12.0–16.0)
LYMPHS ABS: 1.6 10*3/uL (ref 1.0–3.6)
Lymphocyte %: 16 %
MCH: 31 pg (ref 26.0–34.0)
MCHC: 32.8 g/dL (ref 32.0–36.0)
MCV: 95 fL (ref 80–100)
MONO ABS: 0.6 x10 3/mm (ref 0.2–0.9)
MONOS PCT: 6 %
Neutrophil #: 7.6 10*3/uL — ABNORMAL HIGH (ref 1.4–6.5)
Neutrophil %: 76.7 %
Platelet: 168 10*3/uL (ref 150–440)
RBC: 3.4 10*6/uL — ABNORMAL LOW (ref 3.80–5.20)
RDW: 15.1 % — AB (ref 11.5–14.5)
WBC: 9.9 10*3/uL (ref 3.6–11.0)

## 2014-01-29 LAB — BASIC METABOLIC PANEL
ANION GAP: 6 — AB (ref 7–16)
BUN: 9 mg/dL (ref 7–18)
CALCIUM: 7.8 mg/dL — AB (ref 8.5–10.1)
CHLORIDE: 107 mmol/L (ref 98–107)
CO2: 25 mmol/L (ref 21–32)
CREATININE: 0.68 mg/dL (ref 0.60–1.30)
EGFR (African American): >60
Glucose: 92 mg/dL (ref 65–99)
Osmolality: 274 (ref 275–301)
Potassium: 3.5 mmol/L (ref 3.5–5.1)
Sodium: 138 mmol/L (ref 136–145)

## 2014-01-29 LAB — TROPONIN I: Troponin-I: <0.02 ng/mL

## 2014-02-13 ENCOUNTER — Emergency Department: Payer: Self-pay | Admitting: Emergency Medicine

## 2014-03-07 ENCOUNTER — Observation Stay: Payer: Self-pay

## 2014-03-07 LAB — URINALYSIS, COMPLETE
BLOOD: NEGATIVE
Bilirubin,UR: NEGATIVE
Glucose,UR: NEGATIVE mg/dL (ref 0–75)
Ketone: NEGATIVE
Leukocyte Esterase: NEGATIVE
NITRITE: NEGATIVE
Ph: 6 (ref 4.5–8.0)
Protein: NEGATIVE
RBC,UR: 2 /HPF (ref 0–5)
SPECIFIC GRAVITY: 1.012 (ref 1.003–1.030)
WBC UR: 5 /HPF (ref 0–5)

## 2014-03-08 LAB — URINE CULTURE

## 2014-03-18 ENCOUNTER — Observation Stay: Payer: Self-pay | Admitting: Obstetrics and Gynecology

## 2014-04-07 ENCOUNTER — Observation Stay: Payer: Self-pay | Admitting: Obstetrics and Gynecology

## 2014-04-08 ENCOUNTER — Inpatient Hospital Stay: Payer: Self-pay | Admitting: Obstetrics & Gynecology

## 2014-04-08 ENCOUNTER — Observation Stay: Payer: Self-pay | Admitting: Obstetrics & Gynecology

## 2014-04-08 LAB — CBC WITH DIFFERENTIAL/PLATELET
BASOS ABS: 0 10*3/uL (ref 0.0–0.1)
Basophil %: 0.2 %
EOS ABS: 0.1 10*3/uL (ref 0.0–0.7)
EOS PCT: 0.8 %
HCT: 35.5 % (ref 35.0–47.0)
HGB: 11.9 g/dL — AB (ref 12.0–16.0)
Lymphocyte #: 1.3 10*3/uL (ref 1.0–3.6)
Lymphocyte %: 11.7 %
MCH: 31.6 pg (ref 26.0–34.0)
MCHC: 33.5 g/dL (ref 32.0–36.0)
MCV: 94 fL (ref 80–100)
Monocyte #: 0.7 x10 3/mm (ref 0.2–0.9)
Monocyte %: 6.9 %
NEUTROS ABS: 8.6 10*3/uL — AB (ref 1.4–6.5)
NEUTROS PCT: 80.4 %
PLATELETS: 172 10*3/uL (ref 150–440)
RBC: 3.77 10*6/uL — ABNORMAL LOW (ref 3.80–5.20)
RDW: 15.2 % — AB (ref 11.5–14.5)
WBC: 10.7 10*3/uL (ref 3.6–11.0)

## 2014-04-08 LAB — GC/CHLAMYDIA PROBE AMP

## 2014-04-10 LAB — HEMATOCRIT: HCT: 33.2 % — AB (ref 35.0–47.0)

## 2014-09-05 NOTE — H&P (Signed)
L&D Evaluation:  History:  HPI Pt is a 24 yo G2P0010 [facility] 624[facility]d [facility] to L&D with pelvic pressure.  Was evaluated yesterday on L&D for constipation and given a regimen including Miralax.  She had a BM again last night but still has pressure.  +FM, no LOF, no VB, some contractions.   Is on po iron had not tried anything for constipation   Her prenatal care is also significant for BMI >35. She is A+, RI, VI, and has received her flu and tdap vaccine.   Presents with back pain, decreased fetal movement, nausea/vomiting   Patient's Medical History No Chronic Illness   Medications Pre Natal Vitamins   Allergies shellfish   Family History Non-Contributory   Exam:  Vital Signs stable   Urine Protein not completed   General no apparent distress   Mental Status clear   Chest clear   Heart normal sinus rhythm   Abdomen gravid, non-tender   Estimated Fetal Weight Average for gestational age   Fetal Position cephalic by leopolds   Pelvic 3/long/0 posterior   Mebranes Intact   FHT normal rate with no decels   Ucx irregular   Skin dry   Plan:  Comments 23yo G2P0010 @ 39.1 with irregular contractions and constipation.  No cervical change, no LOF VB +FM.  Labor description reviewed with patient.  Will await sponatneous labor with cervical change or SROM.  Return if either happen; otherwise keep next scheduled appointment.   Follow Up Appointment already scheduled   Electronic Signatures: Ward, Elenora Fenderhelsea C (MD)  (Signed 12-Dec-15 12:13)  Authored: L&D Evaluation   Last Updated: 12-Dec-15 12:13 by Ward, Elenora Fenderhelsea C (MD)

## 2014-09-05 NOTE — H&P (Signed)
L&D Evaluation:  History:  HPI Pt is a 24 yo G2P0010 at 6055w1d [facility] presents to L&D with regular painful contractions. Was evaluated earlier today for pelvic pressure and had not made cervical change from evaluation the day before.  Now she presents and has made chance from 4-5cm during her period of evaluation.  She is admitted in labor.  +FM, no LOF, no VB.   Her prenatal care is also significant for BMI >35.  A+, RI, VI, and has received her flu and tdap vaccine.   Presents with back pain, decreased fetal movement, nausea/vomiting   Patient's Medical History No Chronic Illness   Medications Pre Natal Vitamins   Allergies shellfish   Family History Non-Contributory   ROS:  ROS All systems were reviewed.  HEENT, CNS, GI, GU, Respiratory, CV, Renal and Musculoskeletal systems were found to be normal.   Exam:  Vital Signs stable   General no apparent distress   Mental Status clear   Chest clear   Heart normal sinus rhythm   Abdomen gravid, non-tender   Estimated Fetal Weight Average for gestational age   Fetal Position cephalic by leopolds and SVE   Mebranes Intact   Description clear, error - cannot undo selection.   FHT normal rate with no decels   Ucx regular   Ucx Frequency 3 min   Skin dry   Impression:  Impression active labor   Plan:  Plan EFM/NST, monitor contractions and for cervical change   Comments 23yo G2P0010 @ 39 weeks in labor.    Start IV fluids,  External Fetal Monitoring, Contraction monitoring Patient MAY ambulate Continue Expectant management Epidural ok upon request Clear fluids PO  Ranae Plumberhelsea Hermena Swint, MD [phone]   Electronic Signatures: Maecyn Panning, Elenora Fenderhelsea C (MD)  (Signed 12-Dec-15 17:39)  Authored: L&D Evaluation   Last Updated: 12-Dec-15 17:39 by Clairissa Valvano, Elenora Fenderhelsea C (MD)

## 2014-09-05 NOTE — H&P (Signed)
L&D Evaluation:  History:  HPI Pt is a 24 yo G2P0010 at 34.[redacted] weeks GA with an EDC of 04/14/14 presents to L&D with reports of feeling nauseated, and dizzy after standing for long periods at work today. Pt reports that she has also not felt the baby move in several hours while she has been at work. 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 She also reports lower back pain. She is also anemic and has not been taking her iron for several weeks. Her prenatal care is also significant for BMI >35. She is A+, RI, VI, and has received her flu and tdap vaccine.   Presents with back pain, decreased fetal movement, nausea/vomiting   Patient's Medical History No Chronic Illness   Medications Pre Natal Vitamins   Allergies shellfish   Family History Non-Contributory   ROS:  ROS All systems were reviewed.  HEENT, CNS, GI, GU, Respiratory, CV, Renal and Musculoskeletal systems were found to be normal.   Exam:  Vital Signs stable   General no apparent distress   Mental Status clear   Abdomen gravid, non-tender   Pelvic no external lesions, 1/50/-2 posterior   Mebranes Intact   FHT normal rate with no decels, 150's, moderate variability, +accels, no decels cat 1   Ucx irregular   Skin dry, no lesions, no rashes   Lymph no lymphadenopathy   Other UA- specific gravity- 1.012, negative ketones, glucose, blood protein, nitrites, leukocytes. 2 RBC's, 5 WBC's, 2+ bacteria   Impression:  Impression UTI, reactive NST, IUP at 34.4 weeks   Plan:  Plan discharge, rx given for antibiotics, ptl precautions, fkc discussed   Follow Up Appointment need to schedule   Electronic Signatures: Jannet MantisSubudhi, Selita Staiger (CNM)  (Signed [phone]-Nov-15 02:32)  Authored: L&D Evaluation   Last Updated: 10-Nov-15 02:32 by Jannet MantisSubudhi, Doss Cybulski (CNM)

## 2014-09-05 NOTE — H&P (Signed)
L&D Evaluation:  History:  HPI Pt is a 24 yo G2P0010 [facility]d [facility] to L&D after calling clinic and complaining of constipation at being told to come to L&D.  Last BM was yesterday, but reports difficulty in passing.  +FM, no LOF, no VB, some contractions.   Is on po iron had not tried anything for constipation   Her prenatal care is also significant for BMI >35. She is A+, RI, VI, and has received her flu and tdap vaccine.   Presents with back pain, decreased fetal movement, nausea/vomiting   Patient's Medical History No Chronic Illness   Medications Pre Natal Vitamins   Allergies shellfish   Family History Non-Contributory   ROS:  ROS All systems were reviewed.  HEENT, CNS, GI, GU, Respiratory, CV, Renal and Musculoskeletal systems were found to be normal.   Exam:  Vital Signs stable   General no apparent distress   Mental Status clear   Abdomen gravid, non-tender   Pelvic no external lesions, 3/75/-2   Mebranes Intact   FHT normal rate with no decels   Ucx irregular, q5-528min   Skin dry, no lesions, no rashes   Lymph no lymphadenopathy   Impression:  Impression reactive NST, IUP at 39 weeks 0 days gestation r/o labor   Plan:  Comments 1) R/O labor - has been 1cm on prior checks, will perform 2-hr rule out  2) Constipation - discussed miralax, increasing fiber intake, soap sud enema if uncresponsive  3) Fetus - category I tracing  4) Disposition - if unchanged discharge home   Follow Up Appointment already scheduled   Electronic Signatures: Lorrene ReidStaebler, Breanna Mcdaniel M (MD)  (Signed 11-Dec-15 19:06)  Authored: L&D Evaluation   Last Updated: 11-Dec-15 19:06 by Lorrene ReidStaebler, Latiya Navia M (MD)

## 2014-10-15 ENCOUNTER — Emergency Department
Admission: EM | Admit: 2014-10-15 | Discharge: 2014-10-15 | Disposition: A | Discharge status: Home / Self Care | Payer: Self-pay | Attending: Emergency Medicine | Admitting: Emergency Medicine

## 2014-10-15 ENCOUNTER — Encounter: Payer: Self-pay | Admitting: *Deleted

## 2014-10-15 DIAGNOSIS — J4521 Mild intermittent asthma with (acute) exacerbation: Secondary | ICD-10-CM | POA: Insufficient documentation

## 2014-10-15 DIAGNOSIS — Z79899 Other long term (current) drug therapy: Secondary | ICD-10-CM | POA: Insufficient documentation

## 2014-10-15 DIAGNOSIS — Z720 Tobacco use: Secondary | ICD-10-CM | POA: Insufficient documentation

## 2014-10-15 DIAGNOSIS — J40 Bronchitis, not specified as acute or chronic: Secondary | ICD-10-CM

## 2014-10-15 MED ORDER — IPRATROPIUM-ALBUTEROL 0.5-2.5 (3) MG/3ML IN SOLN
RESPIRATORY_TRACT | Status: AC
  Filled 2014-10-15: qty 3

## 2014-10-15 MED ORDER — ALBUTEROL SULFATE HFA 108 (90 BASE) MCG/ACT IN AERS: 2.0000 | INHALATION_SPRAY | Freq: Four times a day (QID) | RESPIRATORY_TRACT | Status: DC | PRN

## 2014-10-15 MED ORDER — IPRATROPIUM-ALBUTEROL 0.5-2.5 (3) MG/3ML IN SOLN
3.0000 mL | Freq: Once | RESPIRATORY_TRACT | Status: AC
  Administered 2014-10-15: 3 mL via RESPIRATORY_TRACT

## 2014-10-15 MED ORDER — DEXAMETHASONE SODIUM PHOSPHATE 10 MG/ML IJ SOLN
INTRAMUSCULAR | Status: AC
  Filled 2014-10-15: qty 1

## 2014-10-15 MED ORDER — PREDNISONE 20 MG PO TABS: 40.0000 mg | ORAL_TABLET | Freq: Every day | ORAL | Status: DC

## 2014-10-15 MED ORDER — LORATADINE-PSEUDOEPHEDRINE ER 5-120 MG PO TB12: 1.0000 | ORAL_TABLET | Freq: Two times a day (BID) | ORAL | Status: DC

## 2014-10-15 MED ORDER — DEXAMETHASONE SODIUM PHOSPHATE 10 MG/ML IJ SOLN
10.0000 mg | Freq: Once | INTRAMUSCULAR | Status: AC
  Administered 2014-10-15: 10 mg via INTRAMUSCULAR

## 2014-10-15 NOTE — ED Notes (Signed)
Pt here for asthma exacerbation.  Pt states that she has been told she has "seasonal ashtma" and she has has some URI and she is out of her inhaler.  No distress in triage

## 2014-10-15 NOTE — Discharge Instructions (Signed)
Asthma Attack Prevention °Although there is no way to prevent asthma from starting, you can take steps to control the disease and reduce its symptoms. Learn about your asthma and how to control it. Take an active role to control your asthma by working with your health care provider to create and follow an asthma action plan. An asthma action plan guides you in: °· Taking your medicines properly. °· Avoiding things that set off your asthma or make your asthma worse (asthma triggers). °· Tracking your level of asthma control. °· Responding to worsening asthma. °· Seeking emergency care when needed. °To track your asthma, keep records of your symptoms, check your peak flow number using a handheld device that shows how well air moves out of your lungs (peak flow meter), and get regular asthma checkups.  °WHAT ARE SOME WAYS TO PREVENT AN ASTHMA ATTACK? °· Take medicines as directed by your health care provider. °· Keep track of your asthma symptoms and level of control. °· With your health care provider, write a detailed plan for taking medicines and managing an asthma attack. Then be sure to follow your action plan. Asthma is an ongoing condition that needs regular monitoring and treatment. °· Identify and avoid asthma triggers. Many outdoor allergens and irritants (such as pollen, mold, cold air, and air pollution) can trigger asthma attacks. Find out what your asthma triggers are and take steps to avoid them. °· Monitor your breathing. Learn to recognize warning signs of an attack, such as coughing, wheezing, or shortness of breath. Your lung function may decrease before you notice any signs or symptoms, so regularly measure and record your peak airflow with a home peak flow meter. °· Identify and treat attacks early. If you act quickly, you are less likely to have a severe attack. You will also need less medicine to control your symptoms. When your peak flow measurements decrease and alert you to an upcoming attack,  take your medicine as instructed and immediately stop any activity that may have triggered the attack. If your symptoms do not improve, get medical help. °· Pay attention to increasing quick-relief inhaler use. If you find yourself relying on your quick-relief inhaler, your asthma is not under control. See your health care provider about adjusting your treatment. °WHAT CAN MAKE MY SYMPTOMS WORSE? °A number of common things can set off or make your asthma symptoms worse and cause temporary increased inflammation of your airways. Keep track of your asthma symptoms for several weeks, detailing all the environmental and emotional factors that are linked with your asthma. When you have an asthma attack, go back to your asthma diary to see which factor, or combination of factors, might have contributed to it. Once you know what these factors are, you can take steps to control many of them. If you have allergies and asthma, it is important to take asthma prevention steps at home. Minimizing contact with the substance to which you are allergic will help prevent an asthma attack. Some triggers and ways to avoid these triggers are: °Animal Dander:  °Some people are allergic to the flakes of skin or dried saliva from animals with fur or feathers.  °· There is no such thing as a hypoallergenic dog or cat breed. All dogs or cats can cause allergies, even if they don't shed. °· Keep these pets out of your home. °· If you are not able to keep a pet outdoors, keep the pet out of your bedroom and other sleeping areas at all   times, and keep the door closed. °· Remove carpets and furniture covered with cloth from your home. If that is not possible, keep the pet away from fabric-covered furniture and carpets. °Dust Mites: °Many people with asthma are allergic to dust mites. Dust mites are tiny bugs that are found in every home in mattresses, pillows, carpets, fabric-covered furniture, bedcovers, clothes, stuffed toys, and other  fabric-covered items.  °· Cover your mattress in a special dust-proof cover. °· Cover your pillow in a special dust-proof cover, or wash the pillow each week in hot water. Water must be hotter than 130° F (54.4° C) to kill dust mites. Cold or warm water used with detergent and bleach can also be effective. °· Wash the sheets and blankets on your bed each week in hot water. °· Try not to sleep or lie on cloth-covered cushions. °· Call ahead when traveling and ask for a smoke-free hotel room. Bring your own bedding and pillows in case the hotel only supplies feather pillows and down comforters, which may contain dust mites and cause asthma symptoms. °· Remove carpets from your bedroom and those laid on concrete, if you can. °· Keep stuffed toys out of the bed, or wash the toys weekly in hot water or cooler water with detergent and bleach. °Cockroaches: °Many people with asthma are allergic to the droppings and remains of cockroaches.  °· Keep food and garbage in closed containers. Never leave food out. °· Use poison baits, traps, powders, gels, or paste (for example, boric acid). °· If a spray is used to kill cockroaches, stay out of the room until the odor goes away. °Indoor Mold: °· Fix leaky faucets, pipes, or other sources of water that have mold around them. °· Clean floors and moldy surfaces with a fungicide or diluted bleach. °· Avoid using humidifiers, vaporizers, or swamp coolers. These can spread molds through the air. °Pollen and Outdoor Mold: °· When pollen or mold spore counts are high, try to keep your windows closed. °· Stay indoors with windows closed from late morning to afternoon. Pollen and some mold spore counts are highest at that time. °· Ask your health care provider whether you need to take anti-inflammatory medicine or increase your dose of the medicine before your allergy season starts. °Other Irritants to Avoid: °· Tobacco smoke is an irritant. If you smoke, ask your health care provider how  you can quit. Ask family members to quit smoking, too. Do not allow smoking in your home or car. °· If possible, do not use a wood-burning stove, kerosene heater, or fireplace. Minimize exposure to all sources of smoke, including incense, candles, fires, and fireworks. °· Try to stay away from strong odors and sprays, such as perfume, talcum powder, hair spray, and paints. °· Decrease humidity in your home and use an indoor air cleaning device. Reduce indoor humidity to below 60%. Dehumidifiers or central air conditioners can do this. °· Decrease house dust exposure by changing furnace and air cooler filters frequently. °· Try to have someone else vacuum for you once or twice a week. Stay out of rooms while they are being vacuumed and for a short while afterward. °· If you vacuum, use a dust mask from a hardware store, a double-layered or microfilter vacuum cleaner bag, or a vacuum cleaner with a HEPA filter. °· Sulfites in foods and beverages can be irritants. Do not drink beer or wine or eat dried fruit, processed potatoes, or shrimp if they cause asthma symptoms. °· Cold   air can trigger an asthma attack. Cover your nose and mouth with a scarf on cold or windy days. °· Several health conditions can make asthma more difficult to manage, including a runny nose, sinus infections, reflux disease, psychological stress, and sleep apnea. Work with your health care provider to manage these conditions. °· Avoid close contact with people who have a respiratory infection such as a cold or the flu, since your asthma symptoms may get worse if you catch the infection. Wash your hands thoroughly after touching items that may have been handled by people with a respiratory infection. °· Get a flu shot every year to protect against the flu virus, which often makes asthma worse for days or weeks. Also get a pneumonia shot if you have not previously had one. Unlike the flu shot, the pneumonia shot does not need to be given  yearly. °Medicines: °· Talk to your health care provider about whether it is safe for you to take aspirin or non-steroidal anti-inflammatory medicines (NSAIDs). In a small number of people with asthma, aspirin and NSAIDs can cause asthma attacks. These medicines must be avoided by people who have known aspirin-sensitive asthma. It is important that people with aspirin-sensitive asthma read labels of all over-the-counter medicines used to treat pain, colds, coughs, and fever. °· Beta-blockers and ACE inhibitors are other medicines you should discuss with your health care provider. °HOW CAN I FIND OUT WHAT I AM ALLERGIC TO? °Ask your asthma health care provider about allergy skin testing or blood testing (the RAST test) to identify the allergens to which you are sensitive. If you are found to have allergies, the most important thing to do is to try to avoid exposure to any allergens that you are sensitive to as much as possible. Other treatments for allergies, such as medicines and allergy shots (immunotherapy) are available.  °CAN I EXERCISE? °Follow your health care provider's advice regarding asthma treatment before exercising. It is important to maintain a regular exercise program, but vigorous exercise or exercise in cold, humid, or dry environments can cause asthma attacks, especially for those people who have exercise-induced asthma. °Document Released: 04/02/2009 Document Revised: 04/19/2013 Document Reviewed: 10/20/2012 °[employer]® Patient Information ©2015 [employer]. This information is not intended to replace advice given to you by your health care provider. Make sure you discuss any questions you have with your health care provider. ° °Asthma °Asthma is a recurring condition in which the airways tighten and narrow. Asthma can make it difficult to breathe. It can cause coughing, wheezing, and shortness of breath. Asthma episodes, also called asthma attacks, range from minor to life-threatening. Asthma  cannot be cured, but medicines and lifestyle changes can help control it. °CAUSES °Asthma is believed to be caused by inherited (genetic) and environmental factors, but its exact cause is unknown. Asthma may be triggered by allergens, lung infections, or irritants in the air. Asthma triggers are different for each person. Common triggers include:  °· Animal dander. °· Dust mites. °· Cockroaches. °· Pollen from trees or grass. °· Mold. °· Smoke. °· Air pollutants such as dust, household cleaners, hair sprays, aerosol sprays, paint fumes, strong chemicals, or strong odors. °· Cold air, weather changes, and winds (which increase molds and pollens in the air). °· Strong emotional expressions such as crying or laughing hard. °· Stress. °· Certain medicines (such as aspirin) or types of drugs (such as beta-blockers). °· Sulfites in foods and drinks. Foods and drinks that may contain sulfites include dried fruit, potato   chips, and sparkling grape juice. °· Infections or inflammatory conditions such as the flu, a cold, or an inflammation of the nasal membranes (rhinitis). °· Gastroesophageal reflux disease (GERD). °· Exercise or strenuous activity. °SYMPTOMS °Symptoms may occur immediately after asthma is triggered or many hours later. Symptoms include: °· Wheezing. °· Excessive nighttime or early morning coughing. °· Frequent or severe coughing with a common cold. °· Chest tightness. °· Shortness of breath. °DIAGNOSIS  °The diagnosis of asthma is made by a review of your medical history and a physical exam. Tests may also be performed. These may include: °· Lung function studies. These tests show how much air you breathe in and out. °· Allergy tests. °· Imaging tests such as X-rays. °TREATMENT  °Asthma cannot be cured, but it can usually be controlled. Treatment involves identifying and avoiding your asthma triggers. It also involves medicines. There are 2 classes of medicine used for asthma treatment:  °· Controller  medicines. These prevent asthma symptoms from occurring. They are usually taken every day. °· Reliever or rescue medicines. These quickly relieve asthma symptoms. They are used as needed and provide short-term relief. °Your health care provider will help you create an asthma action plan. An asthma action plan is a written plan for managing and treating your asthma attacks. It includes a list of your asthma triggers and how they may be avoided. It also includes information on when medicines should be taken and when their dosage should be changed. An action plan may also involve the use of a device called a peak flow meter. A peak flow meter measures how well the lungs are working. It helps you monitor your condition. °HOME CARE INSTRUCTIONS  °· Take medicines only as directed by your health care provider. Speak with your health care provider if you have questions about how or when to take the medicines. °· Use a peak flow meter as directed by your health care provider. Record and keep track of readings. °· Understand and use the action plan to help minimize or stop an asthma attack without needing to seek medical care. °· Control your home environment in the following ways to help prevent asthma attacks: °¨ Do not smoke. Avoid being exposed to secondhand smoke. °¨ Change your heating and air conditioning filter regularly. °¨ Limit your use of fireplaces and wood stoves. °¨ Get rid of pests (such as roaches and mice) and their droppings. °¨ Throw away plants if you see mold on them. °¨ Clean your floors and dust regularly. Use unscented cleaning products. °¨ Try to have someone else vacuum for you regularly. Stay out of rooms while they are being vacuumed and for a short while afterward. If you vacuum, use a dust mask from a hardware store, a double-layered or microfilter vacuum cleaner bag, or a vacuum cleaner with a HEPA filter. °¨ Replace carpet with wood, tile, or vinyl flooring. Carpet can trap dander and  dust. °¨ Use allergy-proof pillows, mattress covers, and box spring covers. °¨ Wash bed sheets and blankets every week in hot water and dry them in a dryer. °¨ Use blankets that are made of polyester or cotton. °¨ Clean bathrooms and kitchens with bleach. If possible, have someone repaint the walls in these rooms with mold-resistant paint. Keep out of the rooms that are being cleaned and painted. °¨ Wash hands frequently. °SEEK MEDICAL CARE IF:  °· You have wheezing, shortness of breath, or a cough even if taking medicine to prevent attacks. °· The colored mucus   you cough up (sputum) is thicker than usual. °· Your sputum changes from clear or white to yellow, green, gray, or bloody. °· You have any problems that may be related to the medicines you are taking (such as a rash, itching, swelling, or trouble breathing). °· You are using a reliever medicine more than 2-3 times per week. °· Your peak flow is still at 50-79% of your personal best after following your action plan for 1 hour. °· You have a fever. °SEEK IMMEDIATE MEDICAL CARE IF:  °· You seem to be getting worse and are unresponsive to treatment during an asthma attack. °· You are short of breath even at rest. °· You get short of breath when doing very little physical activity. °· You have difficulty eating, drinking, or talking due to asthma symptoms. °· You develop chest pain. °· You develop a fast heartbeat. °· You have a bluish color to your lips or fingernails. °· You are light-headed, dizzy, or faint. °· Your peak flow is less than 50% of your personal best. °MAKE SURE YOU:  °· Understand these instructions. °· Will watch your condition. °· Will get help right away if you are not doing well or get worse. °Document Released: 04/14/2005 Document Revised: 08/29/2013 Document Reviewed: 11/11/2012 °[employer]® Patient Information ©2015 [employer]. This information is not intended to replace advice given to you by your health care provider. Make sure you  discuss any questions you have with your health care provider. ° °

## 2014-10-15 NOTE — ED Provider Notes (Signed)
[facility] Emergency Department Provider Note  ____________________________________________  Time seen: Approximately 8:48 PM  I have reviewed the triage vital signs and the nursing notes.   HISTORY  Chief Complaint Asthma    HPI IRMA ROULHAC is a 24 y.o. female comes here with a complaint of asthma exacerbation states that she is been out of her medications she believes her allergies to flared up her she has a cold that is made it worse states that she's wheezing cannot seem to open herself up very well denies any fevers chills nausea vomiting any other complaints of note here today for further evaluation and treatment   Past Medical History  Diagnosis Date  . Asthma     There are no active problems to display for this patient.   History reviewed. No pertinent past surgical history.  Current Outpatient Rx  Name  Route  Sig  Dispense  Refill  . albuterol (PROVENTIL HFA;VENTOLIN HFA) 108 (90 BASE) MCG/ACT inhaler   Inhalation   Inhale 2 puffs into the lungs every 6 (six) hours as needed.           Marland Kitchen EXPIRED: albuterol (PROVENTIL HFA;VENTOLIN HFA) 108 (90 BASE) MCG/ACT inhaler   Inhalation   Inhale 1-2 puffs into the lungs every 6 (six) hours as needed for wheezing.   1 Inhaler   0   . albuterol (PROVENTIL HFA;VENTOLIN HFA) 108 (90 BASE) MCG/ACT inhaler   Inhalation   Inhale 2 puffs into the lungs every 6 (six) hours as needed for wheezing or shortness of breath.   1 Inhaler   2   . ibuprofen (ADVIL,MOTRIN) 200 MG tablet   Oral   Take 200 mg by mouth every 6 (six) hours as needed.         . loratadine-pseudoephedrine (CLARITIN-D 12 HOUR) 5-120 MG per tablet   Oral   Take 1 tablet by mouth 2 (two) times daily.   20 tablet   0   . predniSONE (DELTASONE) 10 MG tablet   Oral   Take 2 tablets (20 mg total) by mouth daily.   15 tablet   0   . predniSONE (DELTASONE) 20 MG tablet   Oral   Take 2 tablets (40 mg total) by mouth  daily.   8 tablet   0     Allergies Shellfish allergy  Family History  Problem Relation Age of Onset  . Cancer Other   . Diabetes Other     Social History History  Substance Use Topics  . Smoking status: Current Every Day Smoker -- 0.50 packs/day  . Smokeless tobacco: Not on file  . Alcohol Use: No    Review of Systems Constitutional: No fever/chills Eyes: No visual changes. ENT: Clear nasal drainage Cardiovascular: Denies chest pain. Respiratory: Normal asthma flares up Gastrointestinal: No abdominal pain.  No nausea, no vomiting.  No diarrhea.  No constipation. Genitourinary: Negative for dysuria. Musculoskeletal: Negative for back pain. Skin: Negative for rash. Neurological: Negative for headaches, focal weakness or numbness.  10-point ROS otherwise negative.  ____________________________________________   PHYSICAL EXAM:  VITAL SIGNS: ED Triage Vitals  Enc Vitals Group     BP 10/15/14 1826 139/85 mmHg     Pulse Rate 10/15/14 1826 102     Resp 10/15/14 1826 22     Temp 10/15/14 1826 98.7 F (37.1 C)     Temp Source 10/15/14 1826 Oral     SpO2 10/15/14 1826 98 %     Weight 10/15/14  1826 225 lb (102.059 kg)     Height --      Head Cir --      Peak Flow --      Pain Score 10/15/14 1827 8     Pain Loc --      Pain Edu? --      Excl. in GC? --     Constitutional: Alert and oriented. Well appearing and in no acute distress. Eyes: Conjunctivae are normal. PERRL. EOMI. Head: Atraumatic. Nose: No congestion/rhinnorhea. Mouth/Throat: Mucous membranes are moist.  Oropharynx non-erythematous. Neck: No stridor.   Cardiovascular: Normal rate, regular rhythm. Grossly normal heart sounds.  Good peripheral circulation. Respiratory: Diminished breath sounds wheezing in all fields  Musculoskeletal: No lower extremity tenderness nor edema.  No joint effusions. Neurologic:  Normal speech and language. No gross focal neurologic deficits are appreciated. Speech is  normal. No gait instability. Skin:  Skin is warm, dry and intact. No rash noted. Psychiatric: Mood and affect are normal. Speech and behavior are normal.  ____________________________________________     PROCEDURES  Procedure(s) performed: None  Critical Care performed: No  ____________________________________________   INITIAL IMPRESSION / ASSESSMENT AND PLAN / ED COURSE  Pertinent labs & imaging results that were available during my care of the patient were reviewed by me and considered in my medical decision making (see chart for details).  Initial impression on this patient ____________________________________________   FINAL CLINICAL IMPRESSION(S) / ED DIAGNOSES  Final diagnoses:  Bronchitis  Asthma, mild intermittent, with acute exacerbation     Quadre Bristol Rosalyn Gess, PA-C 10/15/14 2050  Sharyn Creamer, MD 10/15/14 2359

## 2015-03-17 ENCOUNTER — Encounter: Payer: Self-pay | Admitting: Emergency Medicine

## 2015-03-17 ENCOUNTER — Emergency Department
Admission: EM | Admit: 2015-03-17 | Discharge: 2015-03-17 | Disposition: A | Discharge status: Home / Self Care | Payer: Self-pay | Attending: Emergency Medicine | Admitting: Emergency Medicine

## 2015-03-17 DIAGNOSIS — Z20818 Contact with and (suspected) exposure to other bacterial communicable diseases: Secondary | ICD-10-CM

## 2015-03-17 DIAGNOSIS — F17200 Nicotine dependence, unspecified, uncomplicated: Secondary | ICD-10-CM | POA: Insufficient documentation

## 2015-03-17 DIAGNOSIS — Z79899 Other long term (current) drug therapy: Secondary | ICD-10-CM | POA: Insufficient documentation

## 2015-03-17 DIAGNOSIS — Z7952 Long term (current) use of systemic steroids: Secondary | ICD-10-CM | POA: Insufficient documentation

## 2015-03-17 LAB — POCT RAPID STREP A: Streptococcus, Group A Screen (Direct): NEGATIVE

## 2015-03-17 NOTE — ED Notes (Signed)
Pt to ed wanting to be checked for strep throat.  Pt states her daughter was recently dx with scarlet fever and she wants to make sure she is not a strep carrier.

## 2015-03-17 NOTE — ED Provider Notes (Signed)
[facility] Emergency Department Provider Note  ____________________________________________  Time seen: Approximately 7:56 AM  I have reviewed the triage vital signs and the nursing notes.   HISTORY  Chief Complaint Sore Throat   HPI Shelby Obrien is a 24 y.o. female who presents for evaluation for strep throat. Patient states that her daughter was recently diagnosed with scarlet fever strep throat once to make sure she is not a carrier. She denies any active complaints at this time.   Past Medical History  Diagnosis Date  . Asthma     There are no active problems to display for this patient.   History reviewed. No pertinent past surgical history.  Current Outpatient Rx  Name  Route  Sig  Dispense  Refill  . albuterol (PROVENTIL HFA;VENTOLIN HFA) 108 (90 BASE) MCG/ACT inhaler   Inhalation   Inhale 2 puffs into the lungs every 6 (six) hours as needed.           Marland Kitchen. EXPIRED: albuterol (PROVENTIL HFA;VENTOLIN HFA) 108 (90 BASE) MCG/ACT inhaler   Inhalation   Inhale 1-2 puffs into the lungs every 6 (six) hours as needed for wheezing.   1 Inhaler   0   . albuterol (PROVENTIL HFA;VENTOLIN HFA) 108 (90 BASE) MCG/ACT inhaler   Inhalation   Inhale 2 puffs into the lungs every 6 (six) hours as needed for wheezing or shortness of breath.   1 Inhaler   2   . ibuprofen (ADVIL,MOTRIN) 200 MG tablet   Oral   Take 200 mg by mouth every 6 (six) hours as needed.         . loratadine-pseudoephedrine (CLARITIN-D 12 HOUR) 5-120 MG per tablet   Oral   Take 1 tablet by mouth 2 (two) times daily.   20 tablet   0   . predniSONE (DELTASONE) 10 MG tablet   Oral   Take 2 tablets (20 mg total) by mouth daily.   15 tablet   0   . predniSONE (DELTASONE) 20 MG tablet   Oral   Take 2 tablets (40 mg total) by mouth daily.   8 tablet   0     Allergies Shellfish allergy  Family History  Problem Relation Age of Onset  . Cancer Other   . Diabetes  Other     Social History Social History  Substance Use Topics  . Smoking status: Current Every Day Smoker -- 0.50 packs/day  . Smokeless tobacco: None  . Alcohol Use: No    Review of Systems Constitutional: No fever/chills Eyes: No visual changes. ENT: No sore throat. Cardiovascular: Denies chest pain. Respiratory: Denies shortness of breath. Gastrointestinal: No abdominal pain.  No nausea, no vomiting.  No diarrhea.  No constipation. Genitourinary: Negative for dysuria. Musculoskeletal: Negative for back pain. Skin: Negative for rash. Neurological: Negative for headaches, focal weakness or numbness.  10-point ROS otherwise negative.  ____________________________________________   PHYSICAL EXAM:  VITAL SIGNS: ED Triage Vitals  Enc Vitals Group     BP 03/17/15 0748 133/79 mmHg     Pulse Rate 03/17/15 0748 92     Resp 03/17/15 0748 20     Temp 03/17/15 0748 98.3 F (36.8 C)     Temp Source 03/17/15 0748 Oral     SpO2 03/17/15 0748 98 %     Weight 03/17/15 0748 225 lb (102.059 kg)     Height 03/17/15 0748 5\' 4"  (1.626 m)     Head Cir --  Peak Flow --      Pain Score 03/17/15 0734 0     Pain Loc --      Pain Edu? --      Excl. in GC? --     Constitutional: Alert and oriented. Well appearing and in no acute distress. Eyes: Conjunctivae are normal. PERRL. EOMI. Head: Atraumatic. Nose: No congestion/rhinnorhea. Mouth/Throat: Mucous membranes are moist.  Oropharynx non-erythematous. Neck: No stridor.   Cardiovascular: Normal rate, regular rhythm. Grossly normal heart sounds.  Good peripheral circulation. Respiratory: Normal respiratory effort.  No retractions. Lungs CTAB. Gastrointestinal: Soft and nontender. No distention. No abdominal bruits. No CVA tenderness. Musculoskeletal: No lower extremity tenderness nor edema.  No joint effusions. Neurologic:  Normal speech and language. No gross focal neurologic deficits are appreciated. No gait instability. Skin:   Skin is warm, dry and intact. No rash noted. Psychiatric: Mood and affect are normal. Speech and behavior are normal.  ____________________________________________   LABS (all labs ordered are listed, but only abnormal results are displayed)  Labs Reviewed  CULTURE, GROUP A STREP ([facility] ONLY)  POCT RAPID STREP A     PROCEDURES  Procedure(s) performed: None  Critical Care performed: No  ____________________________________________   INITIAL IMPRESSION / ASSESSMENT AND PLAN / ED COURSE  Pertinent labs & imaging results that were available during my care of the patient were reviewed by me and considered in my medical decision making (see chart for details).  Rapid strep negative for strep. Daughter's rapid strep was also negative which indicates that her strep throat is resolving. Discussed continue over-the-counter medications as needed return if any symptoms develop. Patient voices no other emergency medical complaints at this time. ____________________________________________   FINAL CLINICAL IMPRESSION(S) / ED DIAGNOSES  Final diagnoses:  Streptococcus exposure      Evangeline Dakin, PA-C 03/17/15 1610  Myrna Blazer, MD 03/17/15 [phone]

## 2015-03-17 NOTE — Discharge Instructions (Signed)
Contact Precautions Contact precautions are guidelines for the care of a person who has a disease that can spread by skin-to-skin contact or by contact with bodily fluids, such as saliva, stool, and fluid from open wounds. Following these guidelines helps to prevent the disease from spreading to others. GUIDELINES FOR PATIENTS If you have a disease that can spread by skin-to-skin contact or by contact with bodily fluids, follow these guidelines during your stay:  Check with your nurse before you leave the room in which you are being treated.  Wear a gown and a bandage (dressing) over the infected area if you need to leave the room in which you are being treated.  Wash your hands often. GUIDELINES FOR VISITORS If you are visiting a person who has a disease that can spread by skin-to-skin contact or by contact with bodily fluids, follow these guidelines:  Check with a nurse before you enter a room that has a sign that says "Contact Precautions."  If you are allowed to enter the room, you will be asked to wash your hands and wear a gown and gloves.  Do not take off your gown or gloves in the room until you are leaving.  Do not eat or drink in the room unless you ask a nurse first.  Do not touch anything in the room unless you ask a nurse first.  Right before you leave the room, take off your gown and then your gloves. Leave them in the room as directed. Then wash your hands.   This information is not intended to replace advice given to you by your health care provider. Make sure you discuss any questions you have with your health care provider.   Document Released: 08/29/2014 Document Reviewed: 08/29/2014 Elsevier Interactive Patient Education [employer]2016 Elsevier Inc.

## 2015-03-19 LAB — CULTURE, GROUP A STREP (THRC)

## 2015-05-01 ENCOUNTER — Emergency Department
Admission: EM | Admit: 2015-05-01 | Discharge: 2015-05-01 | Disposition: A | Discharge status: Home / Self Care | Payer: Self-pay | Attending: Emergency Medicine | Admitting: Emergency Medicine

## 2015-05-01 ENCOUNTER — Encounter: Payer: Self-pay | Admitting: Emergency Medicine

## 2015-05-01 DIAGNOSIS — J45909 Unspecified asthma, uncomplicated: Secondary | ICD-10-CM | POA: Insufficient documentation

## 2015-05-01 DIAGNOSIS — Z79899 Other long term (current) drug therapy: Secondary | ICD-10-CM | POA: Insufficient documentation

## 2015-05-01 DIAGNOSIS — F17200 Nicotine dependence, unspecified, uncomplicated: Secondary | ICD-10-CM | POA: Insufficient documentation

## 2015-05-01 DIAGNOSIS — H6592 Unspecified nonsuppurative otitis media, left ear: Secondary | ICD-10-CM | POA: Insufficient documentation

## 2015-05-01 LAB — POCT RAPID STREP A: STREPTOCOCCUS, GROUP A SCREEN (DIRECT): NEGATIVE

## 2015-05-01 MED ORDER — AMOXICILLIN 500 MG PO CAPS: 500.0000 mg | ORAL_CAPSULE | Freq: Three times a day (TID) | ORAL | Status: DC

## 2015-05-01 MED ORDER — PREDNISONE 10 MG PO TABS: ORAL_TABLET | ORAL | Status: DC

## 2015-05-01 NOTE — Discharge Instructions (Signed)
Otitis Media, Adult Otitis media is redness, soreness, and puffiness (swelling) in the space just behind your eardrum (middle ear). It may be caused by allergies or infection. It often happens along with a cold. HOME CARE  Take your medicine as told. Finish it even if you start to feel better.  Only take over-the-counter or prescription medicines for pain, discomfort, or fever as told by your doctor.  Follow up with your doctor as told. GET HELP IF:  You have otitis media only in one ear, or bleeding from your nose, or both.  You notice a lump on your neck.  You are not getting better in 3-5 days.  You feel worse instead of better. GET HELP RIGHT AWAY IF:   You have pain that is not helped with medicine.  You have puffiness, redness, or pain around your ear.  You get a stiff neck.  You cannot move part of your face (paralysis).  You notice that the bone behind your ear hurts when you touch it. MAKE SURE YOU:   Understand these instructions.  Will watch your condition.  Will get help right away if you are not doing well or get worse.   This information is not intended to replace advice given to you by your health care provider. Make sure you discuss any questions you have with your health care provider.   Document Released: 10/01/2007 Document Revised: 05/05/2014 Document Reviewed: 11/09/2012 Elsevier Interactive Patient Education [employer] Elsevier Inc.   Follow-up with Dr. Andee PolesVaught  if any continued problems. Begin taking amoxicillin today and prednisone as directed. You may take Tylenol as needed for pain.

## 2015-05-01 NOTE — ED Provider Notes (Signed)
[facility] Emergency Department Provider Note ____________________________________________  Time seen: Approximately 11:57 AM  I have reviewed the triage vital signs and the nursing notes.   HISTORY  Chief Complaint Sore Throat  HPI Shelby Obrien is a 25 y.o. female for complaint of sore throat and bilateral ear pain for several days. She's been taking NyQuil at night for her congestion and ibuprofen and Tylenol. Patient states that none of these have helped her symptoms and that her years restarting her worse. She is not aware of being exposed to strep throat. At this time she does not have a PCP.She rates her pain as 5 out of 10.   Past Medical History  Diagnosis Date  . Asthma     There are no active problems to display for this patient.   History reviewed. No pertinent past surgical history.  Current Outpatient Rx  Name  Route  Sig  Dispense  Refill  . albuterol (PROVENTIL HFA;VENTOLIN HFA) 108 (90 BASE) MCG/ACT inhaler   Inhalation   Inhale 2 puffs into the lungs every 6 (six) hours as needed.           Marland Kitchen. EXPIRED: albuterol (PROVENTIL HFA;VENTOLIN HFA) 108 (90 BASE) MCG/ACT inhaler   Inhalation   Inhale 1-2 puffs into the lungs every 6 (six) hours as needed for wheezing.   1 Inhaler   0   . albuterol (PROVENTIL HFA;VENTOLIN HFA) 108 (90 BASE) MCG/ACT inhaler   Inhalation   Inhale 2 puffs into the lungs every 6 (six) hours as needed for wheezing or shortness of breath.   1 Inhaler   2   . amoxicillin (AMOXIL) 500 MG capsule   Oral   Take 1 capsule (500 mg total) by mouth 3 (three) times daily.   30 capsule   0   . ibuprofen (ADVIL,MOTRIN) 200 MG tablet   Oral   Take 200 mg by mouth every 6 (six) hours as needed.         . loratadine-pseudoephedrine (CLARITIN-D 12 HOUR) 5-120 MG per tablet   Oral   Take 1 tablet by mouth 2 (two) times daily.   20 tablet   0   . predniSONE (DELTASONE) 10 MG tablet      Take 3 tablets  once a day for 3 days   9 tablet   0     Allergies Shellfish allergy  Family History  Problem Relation Age of Onset  . Cancer Other   . Diabetes Other     Social History Social History  Substance Use Topics  . Smoking status: Current Every Day Smoker -- 0.50 packs/day  . Smokeless tobacco: None  . Alcohol Use: No    Review of Systems Constitutional: No fever/chills Eyes: No visual changes. ENT: Positive sore throat.  Bilateral ear pain positive Cardiovascular: Denies chest pain. Respiratory: Denies shortness of breath. Gastrointestinal: No abdominal pain.  No nausea, no vomiting.  Musculoskeletal: Negative for back pain. Skin: Negative for rash. Neurological: Negative for headaches, focal weakness or numbness.  10-point ROS otherwise negative.  ____________________________________________   PHYSICAL EXAM:  VITAL SIGNS: ED Triage Vitals  Enc Vitals Group     BP 05/01/15 1153 127/77 mmHg     Pulse Rate 05/01/15 1153 83     Resp 05/01/15 1153 18     Temp 05/01/15 1153 98.2 F (36.8 C)     Temp Source 05/01/15 1153 Oral     SpO2 05/01/15 1153 100 %     Weight  05/01/15 1153 220 lb (99.791 kg)     Height 05/01/15 1153 5\' 4"  (1.626 m)     Head Cir --      Peak Flow --      Pain Score 05/01/15 1148 5     Pain Loc --      Pain Edu? --      Excl. in GC? --     Constitutional: Alert and oriented. Well appearing and in no acute distress. Eyes: Conjunctivae are normal. PERRL. EOMI. Head: Atraumatic. Nose: No congestion/rhinnorhea. Left EAC is clear TM is red with effusion. Right EAC and TM are clear. Mouth/Throat: Mucous membranes are moist.  Oropharynx non-erythematous. Positive posterior pharynx drainage Neck: No stridor.  Supple Hematological/Lymphatic/Immunilogical: No cervical lymphadenopathy. Cardiovascular: Normal rate, regular rhythm. Grossly normal heart sounds.  Good peripheral circulation. Respiratory: Normal respiratory effort.  No retractions.  Lungs CTAB. Gastrointestinal: Soft and nontender. No distention.  Musculoskeletal: No lower extremity tenderness nor edema.  No joint effusions. Neurologic:  Normal speech and language. No gross focal neurologic deficits are appreciated. No gait instability. Skin:  Skin is warm, dry and intact. No rash noted. Psychiatric: Mood and affect are normal. Speech and behavior are normal.  ____________________________________________   LABS (all labs ordered are listed, but only abnormal results are displayed)  Labs Reviewed  POCT RAPID STREP A    PROCEDURES  Procedure(s) performed: None  Critical Care performed: No  ____________________________________________   INITIAL IMPRESSION / ASSESSMENT AND PLAN / ED COURSE  Pertinent labs & imaging results that were available during my care of the patient were reviewed by me and considered in my medical decision making (see chart for details).  Amoxicillin 500 mg 3 times a day for 10 days along with prednisone 30 mg once a day for 3 days. Patient is to follow-up with [facility] if any continued problems. She may also take Tylenol if needed for pain. ____________________________________________   FINAL CLINICAL IMPRESSION(S) / ED DIAGNOSES  Final diagnoses:  Left otitis media with effusion      Tommi Rumps, PA-C 05/01/15 1250  Tommi Rumps, PA-C 05/01/15 1253  Jeanmarie Plant, MD 05/01/15 1444

## 2015-05-01 NOTE — ED Notes (Signed)
Having sore throat and ear pain for couple of days

## 2015-06-25 ENCOUNTER — Emergency Department
Admission: EM | Admit: 2015-06-25 | Discharge: 2015-06-25 | Disposition: A | Discharge status: Home / Self Care | Payer: Self-pay | Attending: Emergency Medicine | Admitting: Emergency Medicine

## 2015-06-25 ENCOUNTER — Encounter: Payer: Self-pay | Admitting: *Deleted

## 2015-06-25 DIAGNOSIS — J069 Acute upper respiratory infection, unspecified: Secondary | ICD-10-CM | POA: Insufficient documentation

## 2015-06-25 DIAGNOSIS — J45909 Unspecified asthma, uncomplicated: Secondary | ICD-10-CM | POA: Insufficient documentation

## 2015-06-25 DIAGNOSIS — F17200 Nicotine dependence, unspecified, uncomplicated: Secondary | ICD-10-CM | POA: Insufficient documentation

## 2015-06-25 MED ORDER — IBUPROFEN 800 MG PO TABS: 800.0000 mg | ORAL_TABLET | Freq: Three times a day (TID) | ORAL | Status: DC | PRN

## 2015-06-25 MED ORDER — GUAIFENESIN-CODEINE 100-10 MG/5ML PO SOLN: 10.0000 mL | ORAL | Status: DC | PRN

## 2015-06-25 MED ORDER — CHLORPHENIRAMINE MALEATE 4 MG PO TABS: 4.0000 mg | ORAL_TABLET | Freq: Two times a day (BID) | ORAL | Status: DC | PRN

## 2015-06-25 MED ORDER — OSELTAMIVIR PHOSPHATE 75 MG PO CAPS: 75.0000 mg | ORAL_CAPSULE | Freq: Two times a day (BID) | ORAL | Status: DC

## 2015-06-25 NOTE — ED Notes (Signed)
States left ear pain, body aches, fever and cough since yesterday, states child just had same symptoms

## 2015-06-25 NOTE — Discharge Instructions (Signed)
Upper Respiratory Infection, Adult Most upper respiratory infections (URIs) are a viral infection of the air passages leading to the lungs. A URI affects the nose, throat, and upper air passages. The most common type of URI is nasopharyngitis and is typically referred to as "the common cold." URIs run their course and usually go away on their own. Most of the time, a URI does not require medical attention, but sometimes a bacterial infection in the upper airways can follow a viral infection. This is called a secondary infection. Sinus and middle ear infections are common types of secondary upper respiratory infections. Bacterial pneumonia can also complicate a URI. A URI can worsen asthma and chronic obstructive pulmonary disease (COPD). Sometimes, these complications can require emergency medical care and may be life threatening.  CAUSES Almost all URIs are caused by viruses. A virus is a type of germ and can spread from one person to another.  RISKS FACTORS You may be at risk for a URI if:   You smoke.   You have chronic heart or lung disease.  You have a weakened defense (immune) system.   You are very young or very old.   You have nasal allergies or asthma.  You work in crowded or poorly ventilated areas.  You work in health care facilities or schools. SIGNS AND SYMPTOMS  Symptoms typically develop 2-3 days after you come in contact with a cold virus. Most viral URIs last 7-10 days. However, viral URIs from the influenza virus (flu virus) can last 14-18 days and are typically more severe. Symptoms may include:   Runny or stuffy (congested) nose.   Sneezing.   Cough.   Sore throat.   Headache.   Fatigue.   Fever.   Loss of appetite.   Pain in your forehead, behind your eyes, and over your cheekbones (sinus pain).  Muscle aches.  DIAGNOSIS  Your health care provider may diagnose a URI by:  Physical exam.  Tests to check that your symptoms are not due to  another condition such as:  Strep throat.  Sinusitis.  Pneumonia.  Asthma. TREATMENT  A URI goes away on its own with time. It cannot be cured with medicines, but medicines may be prescribed or recommended to relieve symptoms. Medicines may help:  Reduce your fever.  Reduce your cough.  Relieve nasal congestion. HOME CARE INSTRUCTIONS   Take medicines only as directed by your health care provider.   Gargle warm saltwater or take cough drops to comfort your throat as directed by your health care provider.  Use a warm mist humidifier or inhale steam from a shower to increase air moisture. This may make it easier to breathe.  Drink enough fluid to keep your urine clear or pale yellow.   Eat soups and other clear broths and maintain good nutrition.   Rest as needed.   Return to work when your temperature has returned to normal or as your health care provider advises. You may need to stay home longer to avoid infecting others. You can also use a face mask and careful hand washing to prevent spread of the virus.  Increase the usage of your inhaler if you have asthma.   Do not use any tobacco products, including cigarettes, chewing tobacco, or electronic cigarettes. If you need help quitting, ask your health care provider. PREVENTION  The best way to protect yourself from getting a cold is to practice good hygiene.   Avoid oral or hand contact with people with cold   symptoms.   Wash your hands often if contact occurs.  There is no clear evidence that vitamin C, vitamin E, echinacea, or exercise reduces the chance of developing a cold. However, it is always recommended to get plenty of rest, exercise, and practice good nutrition.  SEEK MEDICAL CARE IF:   You are getting worse rather than better.   Your symptoms are not controlled by medicine.   You have chills.  You have worsening shortness of breath.  You have brown or red mucus.  You have yellow or brown nasal  discharge.  You have pain in your face, especially when you bend forward.  You have a fever.  You have swollen neck glands.  You have pain while swallowing.  You have white areas in the back of your throat. SEEK IMMEDIATE MEDICAL CARE IF:   You have severe or persistent:  Headache.  Ear pain.  Sinus pain.  Chest pain.  You have chronic lung disease and any of the following:  Wheezing.  Prolonged cough.  Coughing up blood.  A change in your usual mucus.  You have a stiff neck.  You have changes in your:  Vision.  Hearing.  Thinking.  Mood. MAKE SURE YOU:   Understand these instructions.  Will watch your condition.  Will get help right away if you are not doing well or get worse.   This information is not intended to replace advice given to you by your health care provider. Make sure you discuss any questions you have with your health care provider.   Document Released: 10/08/2000 Document Revised: 08/29/2014 Document Reviewed: 07/20/2013 Elsevier Interactive Patient Education 2016 Elsevier Inc.  

## 2015-06-25 NOTE — ED Notes (Signed)
States she developed cough and some fever last pm  Now has a sore throat and bilateral ear pain

## 2015-06-25 NOTE — ED Provider Notes (Signed)
[facility] Emergency Department Provider Note  ____________________________________________  Time seen: Approximately 3:55 PM  I have reviewed the triage vital signs and the nursing notes.   HISTORY  Chief Complaint Cough; Generalized Body Aches; and Otalgia    HPI Shelby Obrien is a 25 y.o. female presents with sudden onset of fever cough sore throat and nasal congestion and ear pain bilaterally. Patient states that her child at home and has same symptoms. States worse with movement and feels like her body aches. No relief with any medications over-the-counter such as Tylenol or ibuprofen. Scribe's or body aches is about an 8/10.   Past Medical History  Diagnosis Date  . Asthma     There are no active problems to display for this patient.   History reviewed. No pertinent past surgical history.  Current Outpatient Rx  Name  Route  Sig  Dispense  Refill  . chlorpheniramine (CHLOR-TRIMETON) 4 MG tablet   Oral   Take 1 tablet (4 mg total) by mouth 2 (two) times daily as needed for allergies or rhinitis.   30 tablet   0   . guaiFENesin-codeine 100-10 MG/5ML syrup   Oral   Take 10 mLs by mouth every 4 (four) hours as needed for cough.   180 mL   0   . ibuprofen (ADVIL,MOTRIN) 800 MG tablet   Oral   Take 1 tablet (800 mg total) by mouth every 8 (eight) hours as needed.   30 tablet   0   . oseltamivir (TAMIFLU) 75 MG capsule   Oral   Take 1 capsule (75 mg total) by mouth 2 (two) times daily.   10 capsule   0     Allergies Shellfish allergy  Family History  Problem Relation Age of Onset  . Cancer Other   . Diabetes Other     Social History Social History  Substance Use Topics  . Smoking status: Current Every Day Smoker -- 0.50 packs/day  . Smokeless tobacco: None  . Alcohol Use: No    Review of Systems Constitutional:Positivefever/chills Eyes: No visual changes. ENT: Positive sore throat. Cardiovascular: Denies chest  pain. Respiratory: Denies shortness of breath. Positive  for cough. Genitourinary: Negative for dysuria. Musculoskeletal: Negative for back pain. Positive for generalized body aches. Skin: Negative for rash. Neurological: Negative for headaches, focal weakness or numbness.  10-point ROS otherwise negative.  ____________________________________________   PHYSICAL EXAM:  VITAL SIGNS: ED Triage Vitals  Enc Vitals Group     BP 06/25/15 1532 146/80 mmHg     Pulse Rate 06/25/15 1532 108     Resp 06/25/15 1532 18     Temp 06/25/15 1532 99 F (37.2 C)     Temp Source 06/25/15 1532 Oral     SpO2 06/25/15 1532 100 %     Weight 06/25/15 1532 220 lb (99.791 kg)     Height 06/25/15 1532  (1.626 m)     Head Cir --      Peak Flow --      Pain Score 06/25/15 1533 8     Pain Loc --      Pain Edu? --      Excl. in GC? --     Constitutional: Alert and oriented. Well appearing and in no acute distress. Head: Atraumatic. Nose: Positive congestion/rhinnorhea. Mouth/Throat: Mucous membranes are moist.  Oropharynx non-erythematous. Neck: No stridor.  No cervical adenopathy noted. Cardiovascular: Normal rate, regular rhythm. Grossly normal heart sounds.  Good peripheral circulation.  Respiratory: Normal respiratory effort.  No retractions. Lungs CTAB. Coarse breath sounds noted. Increased coughing with deep respirations. Musculoskeletal: No lower extremity tenderness nor edema.  No joint effusions. Neurologic:  Normal speech and language. No gross focal neurologic deficits are appreciated. No gait instability. Skin:  Skin is warm, dry and intact. No rash noted. Psychiatric: Mood and affect are normal. Speech and behavior are normal.  ____________________________________________   LABS (all labs ordered are listed, but only abnormal results are displayed)  Labs Reviewed - No data to display ____________________________________________   PROCEDURES  Procedure(s) performed:  None  Critical Care performed: No  ____________________________________________   INITIAL IMPRESSION / ASSESSMENT AND PLAN / ED COURSE  Pertinent labs & imaging results that were available during my care of the patient were reviewed by me and considered in my medical decision making (see chart for details).  Acute upper respiratory infection consistent with influenza. Rx given for Tamiflu 75 mg twice a day, Robitussin-AC, Motrin 800 mg 3 times a day, chlorpheniramine for drainage. Work and school excuse given 3 days. Patient follow-up with PCP or return to the ER with any worsening symptomology. ____________________________________________   FINAL CLINICAL IMPRESSION(S) / ED DIAGNOSES  Final diagnoses:  URI (upper respiratory infection)     This chart was dictated using voice recognition software/Dragon. Despite best efforts to proofread, errors can occur which can change the meaning. Any change was purely unintentional.   Evangeline Dakin, PA-C 06/25/15 1601  Emily Filbert, MD 06/25/15 [phone]

## 2015-09-25 ENCOUNTER — Emergency Department: Payer: Self-pay

## 2015-09-25 ENCOUNTER — Encounter: Payer: Self-pay | Admitting: Emergency Medicine

## 2015-09-25 ENCOUNTER — Emergency Department
Admission: EM | Admit: 2015-09-25 | Discharge: 2015-09-25 | Disposition: A | Discharge status: Home / Self Care | Payer: Self-pay | Attending: Emergency Medicine | Admitting: Emergency Medicine

## 2015-09-25 DIAGNOSIS — Z91013 Allergy to seafood: Secondary | ICD-10-CM | POA: Insufficient documentation

## 2015-09-25 DIAGNOSIS — F17200 Nicotine dependence, unspecified, uncomplicated: Secondary | ICD-10-CM | POA: Insufficient documentation

## 2015-09-25 DIAGNOSIS — J45909 Unspecified asthma, uncomplicated: Secondary | ICD-10-CM | POA: Insufficient documentation

## 2015-09-25 DIAGNOSIS — R0789 Other chest pain: Secondary | ICD-10-CM | POA: Insufficient documentation

## 2015-09-25 DIAGNOSIS — R079 Chest pain, unspecified: Secondary | ICD-10-CM

## 2015-09-25 LAB — CBC WITH DIFFERENTIAL/PLATELET
Basophils Absolute: 0 10*3/uL (ref 0–0.1)
Basophils Relative: 0 %
Eosinophils Absolute: 0.1 10*3/uL (ref 0–0.7)
HCT: 38 % (ref 35.0–47.0)
Hemoglobin: 12.7 g/dL (ref 12.0–16.0)
LYMPHS ABS: 1.9 10*3/uL (ref 1.0–3.6)
Lymphocytes Relative: 23 %
MCH: 28.6 pg (ref 26.0–34.0)
MCHC: 33.3 g/dL (ref 32.0–36.0)
MCV: 85.9 fL (ref 80.0–100.0)
MONO ABS: 0.4 10*3/uL (ref 0.2–0.9)
Monocytes Relative: 5 %
Neutro Abs: 5.8 10*3/uL (ref 1.4–6.5)
PLATELETS: 270 10*3/uL (ref 150–440)
RBC: 4.43 MIL/uL (ref 3.80–5.20)
RDW: 15.2 % — ABNORMAL HIGH (ref 11.5–14.5)
WBC: 8.3 10*3/uL (ref 3.6–11.0)

## 2015-09-25 LAB — COMPREHENSIVE METABOLIC PANEL
ALT: 15 U/L (ref 14–54)
AST: 20 U/L (ref 15–41)
Albumin: 3.8 g/dL (ref 3.5–5.0)
Alkaline Phosphatase: 96 U/L (ref 38–126)
Anion gap: 6 (ref 5–15)
BUN: 12 mg/dL (ref 6–20)
CHLORIDE: 104 mmol/L (ref 101–111)
CO2: 27 mmol/L (ref 22–32)
CREATININE: 0.82 mg/dL (ref 0.44–1.00)
Calcium: 8.6 mg/dL — ABNORMAL LOW (ref 8.9–10.3)
Glucose, Bld: 84 mg/dL (ref 65–99)
POTASSIUM: 3.5 mmol/L (ref 3.5–5.1)
Sodium: 137 mmol/L (ref 135–145)
Total Bilirubin: 0.7 mg/dL (ref 0.3–1.2)
Total Protein: 7.6 g/dL (ref 6.5–8.1)

## 2015-09-25 LAB — CK: CK TOTAL: 203 U/L (ref 38–234)

## 2015-09-25 LAB — FIBRIN DERIVATIVES D-DIMER (ARMC ONLY): FIBRIN DERIVATIVES D-DIMER (ARMC): 256 (ref 0–499)

## 2015-09-25 LAB — TROPONIN I

## 2015-09-25 MED ORDER — IBUPROFEN 800 MG PO TABS: 800.0000 mg | ORAL_TABLET | Freq: Three times a day (TID) | ORAL | Status: DC | PRN

## 2015-09-25 MED ORDER — HYDROMORPHONE HCL 1 MG/ML IJ SOLN
0.5000 mg | Freq: Once | INTRAMUSCULAR | Status: AC
  Administered 2015-09-25: 0.5 mg via INTRAVENOUS
  Filled 2015-09-25: qty 1

## 2015-09-25 MED ORDER — LORAZEPAM 1 MG PO TABS: 1.0000 mg | ORAL_TABLET | Freq: Two times a day (BID) | ORAL | Status: DC | PRN

## 2015-09-25 MED ORDER — ONDANSETRON HCL 4 MG/2ML IJ SOLN
4.0000 mg | Freq: Once | INTRAMUSCULAR | Status: AC
  Administered 2015-09-25: 4 mg via INTRAVENOUS
  Filled 2015-09-25: qty 2

## 2015-09-25 MED ORDER — TRAMADOL HCL 50 MG PO TABS: 50.0000 mg | ORAL_TABLET | Freq: Four times a day (QID) | ORAL | Status: DC | PRN

## 2015-09-25 MED ORDER — LORAZEPAM 2 MG/ML IJ SOLN
0.5000 mg | Freq: Once | INTRAMUSCULAR | Status: AC
  Administered 2015-09-25: 0.5 mg via INTRAVENOUS
  Filled 2015-09-25: qty 0.25
  Filled 2015-09-25: qty 1

## 2015-09-25 NOTE — ED Notes (Signed)
Pt with shortness of breath and chest pain started yesterday. Pt with hx of asthma/copd. No resp distress noted in triage.

## 2015-09-25 NOTE — Discharge Instructions (Signed)
Chest Wall Pain °Chest wall pain is pain in or around the bones and muscles of your chest. Sometimes, an injury causes this pain. Sometimes, the cause may not be known. This pain may take several weeks or longer to get better. °HOME CARE °Pay attention to any changes in your symptoms. Take these actions to help with your pain: °· Rest as told by your doctor. °· Avoid activities that cause pain. Try not to use your chest, belly (abdominal), or side muscles to lift heavy things. °· If directed, apply ice to the painful area: °¨ Put ice in a plastic bag. °¨ Place a towel between your skin and the bag. °¨ Leave the ice on for 20 minutes, 2-3 times per day. °· Take over-the-counter and prescription medicines only as told by your doctor. °· Do not use tobacco products, including cigarettes, chewing tobacco, and e-cigarettes. If you need help quitting, ask your doctor. °· Keep all follow-up visits as told by your doctor. This is important. °GET HELP IF: °· You have a fever. °· Your chest pain gets worse. °· You have new symptoms. °GET HELP RIGHT AWAY IF: °· You feel sick to your stomach (nauseous) or you throw up (vomit). °· You feel sweaty or light-headed. °· You have a cough with phlegm (sputum) or you cough up blood. °· You are short of breath. °  °This information is not intended to replace advice given to you by your health care provider. Make sure you discuss any questions you have with your health care provider. °  °Document Released: 10/01/2007 Document Revised: 01/03/2015 Document Reviewed: 07/10/2014 °Elsevier Interactive Patient Education ©2016 [employer] ° ° °Please return immediately if condition worsens. Please contact her primary physician or the physician you were given for referral. If you have any specialist physicians involved in her treatment and plan please also contact them. Thank you for using [facility]. ° °

## 2015-09-25 NOTE — ED Notes (Signed)
Pt reports that she has had allergy-like symptoms since Friday, and yesterday began to have pain in her head, neck, and chest. She also reports vomiting x 4 yesterday, and cannot eat today. Pt states SOB started last night. Pt has hx of asthma but that this feels different; inhaler used 3x today. Pt alert & oriented with NAD noted.

## 2015-09-25 NOTE — ED Provider Notes (Signed)
Time Seen: Approximately 1700 I have reviewed the triage notes  Chief Complaint: Shortness of Breath   History of Present Illness: Shelby Obrien is a 25 y.o. female who presents with some diffuse chest and upper back discomfort. She states her symptoms started on Friday and seemed to accelerate yesterday. She states she had some nausea and vomited 4 yesterday. She describes a decreased appetite today. She denies any true shortness of breath and states it hurts to take a deep breath. She has a history of asthma but feels that this is different presentation from her typical asthma. As any wheezing, fever or productive nature to her cough. Pain is sharp and exacerbated by any movement. She denies any photophobia, focal weakness either upper or lower extremities. No midline neck pain and she points mainly to the lateral regions of her neck is source of discomfort.  Past Medical History  Diagnosis Date  . Asthma     There are no active problems to display for this patient.   History reviewed. No pertinent past surgical history.  History reviewed. No pertinent past surgical history.  Current Outpatient Rx  Name  Route  Sig  Dispense  Refill  . chlorpheniramine (CHLOR-TRIMETON) 4 MG tablet   Oral   Take 1 tablet (4 mg total) by mouth 2 (two) times daily as needed for allergies or rhinitis.   30 tablet   0   . guaiFENesin-codeine 100-10 MG/5ML syrup   Oral   Take 10 mLs by mouth every 4 (four) hours as needed for cough.   180 mL   0   . ibuprofen (ADVIL,MOTRIN) 800 MG tablet   Oral   Take 1 tablet (800 mg total) by mouth every 8 (eight) hours as needed.   30 tablet   0   . LORazepam (ATIVAN) 1 MG tablet   Oral   Take 1 tablet (1 mg total) by mouth 2 (two) times daily as needed (muscle spasm).   10 tablet   0   . oseltamivir (TAMIFLU) 75 MG capsule   Oral   Take 1 capsule (75 mg total) by mouth 2 (two) times daily.   10 capsule   0   . traMADol (ULTRAM) 50 MG  tablet   Oral   Take 1 tablet (50 mg total) by mouth every 6 (six) hours as needed.   20 tablet   0     Allergies:  Shellfish allergy  Family History: Family History  Problem Relation Age of Onset  . Cancer Other   . Diabetes Other     Social History: Social History  Substance Use Topics  . Smoking status: Current Every Day Smoker -- 0.50 packs/day  . Smokeless tobacco: None  . Alcohol Use: No     Review of Systems:   10 point review of systems was performed and was otherwise negative:  Constitutional: No fever Eyes: No visual disturbances ENT: No sore throat, ear pain Cardiac: Chest pain is diffuse across the upper chest wall region Respiratory: No shortness of breath, wheezing, or stridor Abdomen: No abdominal pain, no vomiting, No diarrhea Endocrine: No weight loss, No night sweats Extremities: No peripheral edema, cyanosis Skin: No rashes, easy bruising Neurologic: No focal weakness, trouble with speech or swollowing Urologic: No dysuria, Hematuria, or urinary frequency Patient is currently on birth control and otherwise has no other pulmonary emboli risk factors.  Physical Exam:  ED Triage Vitals  Enc Vitals Group     BP 09/25/15 1514 140/80  mmHg     Pulse Rate 09/25/15 1514 78     Resp 09/25/15 1514 22     Temp 09/25/15 1514 98.8 F (37.1 C)     Temp Source 09/25/15 1514 Oral     SpO2 09/25/15 1514 99 %     Weight 09/25/15 1514 225 lb (102.059 kg)     Height 09/25/15 1514 5\' 4"  (1.626 m)     Head Cir --      Peak Flow --      Pain Score 09/25/15 1515 9     Pain Loc --      Pain Edu? --      Excl. in GC? --     General: Awake , Alert , and Oriented times 3; GCS 15 Head: Normal cephalic , atraumatic Eyes: Pupils equal , round, reactive to light Nose/Throat: No nasal drainage, patent upper airway without erythema or exudate.  Neck: Supple, Full range of motion, No anterior adenopathy or palpable thyroid masses. She has bilateral. Muscle spinal  discomfort. No true meningeal signs Lungs: Clear to ascultation without wheezes , rhonchi, or rales. Positive pleuritic pain. Heart: Regular rate, regular rhythm without murmurs , gallops , or rubs Abdomen: Soft, non tender without rebound, guarding , or rigidity; bowel sounds positive and symmetric in all 4 quadrants. No organomegaly .        Extremities: 2 plus symmetric pulses. No edema, clubbing or cyanosis Neurologic: normal ambulation, Motor symmetric without deficits, sensory intact Skin: warm, dry, no rashes Chest wall examination shows reproducible pain diffuse with just light minimal touch to the anterior chest and up across the trapezius region.  Labs:   All laboratory work was reviewed including any pertinent negatives or positives listed below:  Labs Reviewed  CBC WITH DIFFERENTIAL/PLATELET - Abnormal; Notable for the following:    RDW 15.2 (*)    All other components within normal limits  COMPREHENSIVE METABOLIC PANEL - Abnormal; Notable for the following:    Calcium 8.6 (*)    All other components within normal limits  TROPONIN I  CK  FIBRIN DERIVATIVES D-DIMER ([facility] ONLY)  Review of her laboratory work showed no significant abnormalities and D-dimer test was negative  EKG: * ED ECG REPORT I, Jennye MoccasinBrian S Quigley, the attending physician, personally viewed and interpreted this ECG.  Date: 09/25/2015 EKG Time: 1519 Rate: *89 Rhythm: normal sinus rhythm QRS Axis: normal Intervals: normal ST/T Wave abnormalities: normal Conduction Disturbances: none Narrative Interpretation: unremarkable Poor R-wave progression but otherwise normal EKG   Radiology: *   DG Chest 2 View (Final result) Result time: 09/25/15 16:45:18   Final result by Rad Results In Interface (09/25/15 16:45:18)   Narrative:   CLINICAL DATA: Acute left-sided chest pain.  EXAM: CHEST 2 VIEW  COMPARISON: August 06, 2013.  FINDINGS: The heart size and mediastinal contours are within normal  limits. Both lungs are clear. No pneumothorax or pleural effusion is noted. The visualized skeletal structures are unremarkable.  IMPRESSION: No active cardiopulmonary disease.   Electronically Signed     I personally reviewed the radiologic studies    ED Course: * Differential includes all life-threatening causes for chest pain. This includes but is not exclusive to acute coronary syndrome, aortic dissection, pulmonary embolism, cardiac tamponade, community-acquired pneumonia, pericarditis, musculoskeletal chest wall pain, etc.  Patient states that she had some mild symptomatic improvement still has chest discomfort after IV Dilaudid low-dose along with Ativan low dose and some IV fluid. I felt this was unlikely to  be a life-threatening cause for chest pain such as aortic dissection, pulmonary embolism, etc. She does not have any wheezing on auscultation and her saturations on her oxygen level were normal. Reproducible nature of her pain I felt this was chest wall discomfort and the patient be started on prescription strength ibuprofen along with oral narcotics and muscle relaxant therapy. She's been advised drink plenty of fluids and has a scheduled follow-up appointment on Friday at the [facility]. I printed up copies of all laboratory work so that she could take these with her to her appointment review and see if any other further testing is necessary.   Assessment:  Acute chest wall pain  Final Clinical Impression:  Final diagnoses:  Chest pain syndrome     Plan:  Outpatient management Patient was advised to return immediately if condition worsens. Patient was advised to follow up with their primary care physician or other specialized physicians involved in their outpatient care. The patient and/or family member/power of attorney had laboratory results reviewed at the bedside. All questions and concerns were addressed and appropriate discharge instructions were distributed by  the nursing staff.             Jennye Moccasin, MD 09/25/15 [phone]

## 2015-11-12 ENCOUNTER — Emergency Department
Admission: EM | Admit: 2015-11-12 | Discharge: 2015-11-12 | Disposition: A | Discharge status: Home / Self Care | Payer: Self-pay | Attending: Emergency Medicine | Admitting: Emergency Medicine

## 2015-11-12 ENCOUNTER — Encounter: Payer: Self-pay | Admitting: Emergency Medicine

## 2015-11-12 DIAGNOSIS — F17200 Nicotine dependence, unspecified, uncomplicated: Secondary | ICD-10-CM | POA: Insufficient documentation

## 2015-11-12 DIAGNOSIS — Z79899 Other long term (current) drug therapy: Secondary | ICD-10-CM | POA: Insufficient documentation

## 2015-11-12 DIAGNOSIS — J45909 Unspecified asthma, uncomplicated: Secondary | ICD-10-CM | POA: Insufficient documentation

## 2015-11-12 DIAGNOSIS — T782XXA Anaphylactic shock, unspecified, initial encounter: Secondary | ICD-10-CM | POA: Insufficient documentation

## 2015-11-12 MED ORDER — ALBUTEROL SULFATE (2.5 MG/3ML) 0.083% IN NEBU
2.5000 mg | INHALATION_SOLUTION | Freq: Once | RESPIRATORY_TRACT | Status: AC
  Administered 2015-11-12: 2.5 mg via RESPIRATORY_TRACT

## 2015-11-12 MED ORDER — METHYLPREDNISOLONE SODIUM SUCC 125 MG IJ SOLR
125.0000 mg | Freq: Once | INTRAMUSCULAR | Status: AC
  Administered 2015-11-12: 125 mg via INTRAVENOUS
  Filled 2015-11-12: qty 2

## 2015-11-12 MED ORDER — PREDNISONE 10 MG PO TABS: ORAL_TABLET | ORAL | Status: DC

## 2015-11-12 MED ORDER — EPINEPHRINE 0.3 MG/0.3ML IJ SOAJ: 0.3000 mg | Freq: Once | INTRAMUSCULAR | Status: DC

## 2015-11-12 MED ORDER — FAMOTIDINE IN NACL 20-0.9 MG/50ML-% IV SOLN
20.0000 mg | Freq: Once | INTRAVENOUS | Status: AC
  Administered 2015-11-12: 20 mg via INTRAVENOUS
  Filled 2015-11-12: qty 50

## 2015-11-12 MED ORDER — EPINEPHRINE 0.3 MG/0.3ML IJ SOAJ
INTRAMUSCULAR | Status: AC
  Administered 2015-11-12: 0.3 mg via INTRAMUSCULAR
  Filled 2015-11-12: qty 0.3

## 2015-11-12 MED ORDER — EPINEPHRINE 0.3 MG/0.3ML IJ SOAJ
0.3000 mg | Freq: Once | INTRAMUSCULAR | Status: AC
  Administered 2015-11-12: 0.3 mg via INTRAMUSCULAR

## 2015-11-12 MED ORDER — DIPHENHYDRAMINE HCL 25 MG PO CAPS
25.0000 mg | ORAL_CAPSULE | Freq: Once | ORAL | Status: AC
  Administered 2015-11-12: 25 mg via ORAL
  Filled 2015-11-12: qty 1

## 2015-11-12 MED ORDER — ALBUTEROL SULFATE (2.5 MG/3ML) 0.083% IN NEBU
INHALATION_SOLUTION | RESPIRATORY_TRACT | Status: AC
  Administered 2015-11-12: 2.5 mg via RESPIRATORY_TRACT
  Filled 2015-11-12: qty 3

## 2015-11-12 MED ORDER — DIPHENHYDRAMINE HCL 50 MG/ML IJ SOLN
50.0000 mg | Freq: Once | INTRAMUSCULAR | Status: AC
  Administered 2015-11-12: 50 mg via INTRAVENOUS
  Filled 2015-11-12: qty 1

## 2015-11-12 NOTE — Discharge Instructions (Signed)
You were evaluated for allergic reaction and treated for severe allergic reaction, anaphylaxis.  Return to emergency room for any worsening condition including trouble breathing, swelling of the throat, dizziness or passing out, or any other symptoms concerning to you.  Continue to take over-the-counter Benadryl 25 mg every 4 hours as needed over the next 4 days for itching or allergic reaction prevention. Continue to take Zantac, over-the-counter, 150 mg once daily for 4 more days.  Take prednisone as well, prescription taper.   Anaphylactic Reaction An anaphylactic reaction is a sudden, severe allergic reaction that involves the whole body. It can be life threatening. A hospital stay is often required. People with asthma, eczema, or hay fever are slightly more likely to have an anaphylactic reaction. CAUSES  An anaphylactic reaction may be caused by anything to which you are allergic. After being exposed to the allergic substance, your immune system becomes sensitized to it. When you are exposed to that allergic substance again, an allergic reaction can occur. Common causes of an anaphylactic reaction include:  Medicines.  Foods, especially peanuts, wheat, shellfish, milk, and eggs.  Insect bites or stings.  Blood products.  Chemicals, such as dyes, latex, and contrast material used for imaging tests. SYMPTOMS  When an allergic reaction occurs, the body releases histamine and other substances. These substances cause symptoms such as tightening of the airway. Symptoms often develop within seconds or minutes of exposure. Symptoms may include:  Skin rash or hives.  Itching.  Chest tightness.  Swelling of the eyes, tongue, or lips.  Trouble breathing or swallowing.  Lightheadedness or fainting.  Anxiety or confusion.  Stomach pains, vomiting, or diarrhea.  Nasal congestion.  A fast or irregular heartbeat (palpitations). DIAGNOSIS  Diagnosis is based on your history of  recent exposure to allergic substances, your symptoms, and a physical exam. Your caregiver may also perform blood or urine tests to confirm the diagnosis. TREATMENT  Epinephrine medicine is the main treatment for an anaphylactic reaction. Other medicines that may be used for treatment include antihistamines, steroids, and albuterol. In severe cases, fluids and medicine to support blood pressure may be given through an intravenous line (IV). Even if you improve after treatment, you need to be observed to make sure your condition does not get worse. This may require a stay in the hospital. Revloc a medical alert bracelet or necklace stating your allergy.  You and your family must learn how to use an anaphylaxis kit or give an epinephrine injection to temporarily treat an emergency allergic reaction. Always carry your epinephrine injection or anaphylaxis kit with you. This can be lifesaving if you have a severe reaction.  Do not drive or perform tasks after treatment until the medicines used to treat your reaction have worn off, or until your caregiver says it is okay.  If you have hives or a rash:  Take medicines as directed by your caregiver.  You may use an over-the-counter antihistamine (diphenhydramine) as needed.  Apply cold compresses to the skin or take baths in cool water. Avoid hot baths or showers. SEEK MEDICAL CARE IF:   You develop symptoms of an allergic reaction to a new substance. Symptoms may start right away or minutes later.  You develop a rash, hives, or itching.  You develop new symptoms. SEEK IMMEDIATE MEDICAL CARE IF:   You have swelling of the mouth, difficulty breathing, or wheezing.  You have a tight feeling in your chest or throat.  You develop  hives, swelling, or itching all over your body.  You develop severe vomiting or diarrhea.  You feel faint or pass out. This is an emergency. Use your epinephrine injection or anaphylaxis kit as  you have been instructed. Call your local emergency services (911 in [place] Even if you improve after the injection, you need to be examined at a hospital emergency department. MAKE SURE YOU:   Understand these instructions.  Will watch your condition.  Will get help right away if you are not doing well or get worse.   This information is not intended to replace advice given to you by your health care provider. Make sure you discuss any questions you have with your health care provider.   Document Released: 04/14/2005 Document Revised: 04/19/2013 Document Reviewed: 10/25/2014 Elsevier Interactive Patient Education [employer].

## 2015-11-12 NOTE — ED Provider Notes (Signed)
[facility] Emergency Department Provider Note   ____________________________________________  Time seen: Immediately upon arrival in the hallway bed I have reviewed the triage vital signs and the triage nursing note.  HISTORY  Chief Complaint Allergic Reaction   Historian Limited due to emergency condition, patient in extremis with anaphylactic reaction.   HPI Shelby Obrien is a 25 y.o. female with a history of asthma and allergic reaction to shellfish or nuts presents with throat swelling and trouble breathing to unknown trigger. She had chicken, no known allergenic ingestion or exposure. Symptoms are moderate to severe. She also feels lightheaded. She has some right-sided chest discomfort.   Denies having EpiPen or using epinephrine in the past.    Past Medical History  Diagnosis Date  . Asthma     There are no active problems to display for this patient.   History reviewed. No pertinent past surgical history.  Current Outpatient Rx  Name  Route  Sig  Dispense  Refill  . chlorpheniramine (CHLOR-TRIMETON) 4 MG tablet   Oral   Take 1 tablet (4 mg total) by mouth 2 (two) times daily as needed for allergies or rhinitis.   30 tablet   0   . EPINEPHrine (EPIPEN 2-PAK) 0.3 mg/0.3 mL IJ SOAJ injection   Intramuscular   Inject 0.3 mLs (0.3 mg total) into the muscle once.   1 Device   2   . guaiFENesin-codeine 100-10 MG/5ML syrup   Oral   Take 10 mLs by mouth every 4 (four) hours as needed for cough.   180 mL   0   . ibuprofen (ADVIL,MOTRIN) 800 MG tablet   Oral   Take 1 tablet (800 mg total) by mouth every 8 (eight) hours as needed.   30 tablet   0   . LORazepam (ATIVAN) 1 MG tablet   Oral   Take 1 tablet (1 mg total) by mouth 2 (two) times daily as needed (muscle spasm).   10 tablet   0   . oseltamivir (TAMIFLU) 75 MG capsule   Oral   Take 1 capsule (75 mg total) by mouth 2 (two) times daily.   10 capsule   0   . predniSONE  (DELTASONE) 10 MG tablet      Take 50 mg daily for 2 days Take 40 mg daily for 2 days Take 30 mg daily for 2 days Take 20 mg daily for 2 days Take 10 mg daily for 2 days   30 tablet   0   . traMADol (ULTRAM) 50 MG tablet   Oral   Take 1 tablet (50 mg total) by mouth every 6 (six) hours as needed.   20 tablet   0     Allergies Shellfish allergy  Family History  Problem Relation Age of Onset  . Cancer Other   . Diabetes Other     Social History Social History  Substance Use Topics  . Smoking status: Current Every Day Smoker -- 0.50 packs/day  . Smokeless tobacco: None  . Alcohol Use: No    Review of Systems Limited due to medical emergency condition  Constitutional:  Eyes: Negative for visual changes. ENT: Negative for sore throat. Cardiovascular: Positive for chest pain. Respiratory: Positive for shortness of breath. Gastrointestinal: Negative for vomiting and diarrhea. Genitourinary: Musculoskeletal: Skin: Negative for rash. Neurological: Negative for headache.  ____________________________________________   PHYSICAL EXAM:  VITAL SIGNS: ED Triage Vitals  Enc Vitals Group     BP 11/12/15 1909 149/103  mmHg     Pulse Rate 11/12/15 1909 100     Resp 11/12/15 1909 26     Temp 11/12/15 1908 97.6 F (36.4 C)     Temp Source 11/12/15 1908 Oral     SpO2 11/12/15 1909 100 %     Weight 11/12/15 1909 223 lb (101.152 kg)     Height --      Head Cir --      Peak Flow --      Pain Score --      Pain Loc --      Pain Edu? --      Excl. in GC? --      Constitutional: Alert but having trouble breathing. HEENT   Head: Normocephalic and atraumatic.      Eyes: Conjunctivae are normal. PERRL. Normal extraocular movements.      Ears:         Nose: No congestion/rhinnorhea.   Mouth/Throat: Mucous membranes are moist.   Neck: No stridor. Cardiovascular/Chest: Normal rate, regular rhythm.  No murmurs, rubs, or gallops. Respiratory: Tachypnea with  decreased breath sounds, mild wheezing.. Gastrointestinal: Soft. No distention, no guarding, no rebound. Nontender.   obese  Genitourinary/rectal:Deferred Musculoskeletal: Nontender with normal range of motion in all extremities. No joint effusions.  No lower extremity tenderness.  No edema. Neurologic:  Normal speech and language. No gross or focal neurologic deficits are appreciated. Skin:  Skin is warm, dry and intact. No rash noted. Psychiatric: Mood and affect are normal. Speech and behavior are normal. Patient exhibits appropriate insight and judgment.  ____________________________________________   EKG I, Governor Rooksebecca Dow Blahnik, MD, the attending physician have personally viewed and interpreted all ECGs.   97 bpm. Normal sinus rhythm. Narrow QRS. Normal axis. Normal ST and T-wave ____________________________________________  LABS (pertinent positives/negatives)  Labs Reviewed - No data to display  ____________________________________________  RADIOLOGY All Xrays were viewed by me. Imaging interpreted by Radiologist.   none __________________________________________  PROCEDURES  Procedure(s) performed: None  Critical Care performed: CRITICAL CARE Performed by: Governor RooksLORD, Jesicca Dipierro   Total critical care time: 30 minutes  Critical care time was exclusive of separately billable procedures and treating other patients.  Critical care was necessary to treat or prevent imminent or life-threatening deterioration.  Critical care was time spent personally by me on the following activities: development of treatment plan with patient and/or surrogate as well as nursing, discussions with consultants, evaluation of patient's response to treatment, examination of patient, obtaining history from patient or surrogate, ordering and performing treatments and interventions, ordering and review of laboratory studies, ordering and review of radiographic studies, pulse oximetry and re-evaluation of  patient's condition.   ____________________________________________   ED COURSE / ASSESSMENT AND PLAN  Pertinent labs & imaging results that were available during my care of the patient were reviewed by me and considered in my medical decision making (see chart for details).   Patient arrived with anaphylactic reaction by history, no known trigger, but possible for cross contamination of food.  She look like she was in extremis in terms of feeling shortness of breath, and throat swelling and she was given intramuscular EpiPen.  IV was established and patient was given IV Benadryl, Solu-Medrol, and Pepcid. Patient was started on albuterol nebulizer to help with wheezing. She did start feeling some improvement in terms of trouble breathing and discomfort to her throat and face. She then reported having a bit of a headache which I suspect was side effect from the epinephrine.  No recurrence of symptoms.  We discussed return precautions especially with respect to any biphasic or return of symptoms.  I will discharge her with an EpiPen rx as well as a prednisone rx taper.     CONSULTATIONS:    None  Patient / Family / Caregiver informed of clinical course, medical decision-making process, and agree with plan.   I discussed return precautions, follow-up instructions, and discharged instructions with patient and/or family.   ___________________________________________   FINAL CLINICAL IMPRESSION(S) / ED DIAGNOSES   Final diagnoses:  Anaphylaxis, initial encounter              Note: This dictation was prepared with Dragon dictation. Any transcriptional errors that result from this process are unintentional   Governor Rooks, MD 11/12/15 2315

## 2015-11-12 NOTE — ED Notes (Signed)
Was eating and pt has allergy to shellfish. Tightness in chest noted per pt and feels like throat closing.

## 2016-02-26 ENCOUNTER — Emergency Department: Payer: Self-pay

## 2016-02-26 ENCOUNTER — Emergency Department
Admission: EM | Admit: 2016-02-26 | Discharge: 2016-02-26 | Disposition: A | Discharge status: Home / Self Care | Payer: Self-pay | Attending: Emergency Medicine | Admitting: Emergency Medicine

## 2016-02-26 DIAGNOSIS — S93402A Sprain of unspecified ligament of left ankle, initial encounter: Secondary | ICD-10-CM | POA: Insufficient documentation

## 2016-02-26 DIAGNOSIS — J45909 Unspecified asthma, uncomplicated: Secondary | ICD-10-CM | POA: Insufficient documentation

## 2016-02-26 DIAGNOSIS — Y929 Unspecified place or not applicable: Secondary | ICD-10-CM | POA: Insufficient documentation

## 2016-02-26 DIAGNOSIS — Y999 Unspecified external cause status: Secondary | ICD-10-CM | POA: Insufficient documentation

## 2016-02-26 DIAGNOSIS — W010XXA Fall on same level from slipping, tripping and stumbling without subsequent striking against object, initial encounter: Secondary | ICD-10-CM | POA: Insufficient documentation

## 2016-02-26 DIAGNOSIS — Y9389 Activity, other specified: Secondary | ICD-10-CM | POA: Insufficient documentation

## 2016-02-26 DIAGNOSIS — F17200 Nicotine dependence, unspecified, uncomplicated: Secondary | ICD-10-CM | POA: Insufficient documentation

## 2016-02-26 DIAGNOSIS — Z791 Long term (current) use of non-steroidal anti-inflammatories (NSAID): Secondary | ICD-10-CM | POA: Insufficient documentation

## 2016-02-26 DIAGNOSIS — Z79899 Other long term (current) drug therapy: Secondary | ICD-10-CM | POA: Insufficient documentation

## 2016-02-26 MED ORDER — IBUPROFEN 600 MG PO TABS
600.0000 mg | ORAL_TABLET | Freq: Once | ORAL | Status: AC
  Administered 2016-02-26: 600 mg via ORAL
  Filled 2016-02-26: qty 1

## 2016-02-26 MED ORDER — IBUPROFEN 600 MG PO TABS: 600.0000 mg | ORAL_TABLET | Freq: Three times a day (TID) | ORAL | 0 refills | Status: DC | PRN

## 2016-02-26 MED ORDER — TRAMADOL HCL 50 MG PO TABS
50.0000 mg | ORAL_TABLET | Freq: Once | ORAL | Status: AC
  Administered 2016-02-26: 50 mg via ORAL
  Filled 2016-02-26: qty 1

## 2016-02-26 MED ORDER — TRAMADOL HCL 50 MG PO TABS: 50.0000 mg | ORAL_TABLET | Freq: Four times a day (QID) | ORAL | 0 refills | Status: DC | PRN

## 2016-02-26 MED ORDER — TRAMADOL HCL 50 MG PO TABS: 50.0000 mg | ORAL_TABLET | Freq: Two times a day (BID) | ORAL | 0 refills | Status: DC | PRN

## 2016-02-26 NOTE — ED Triage Notes (Signed)
Pt to triage via w/c with no distress noted; reports PTA, while trick or treating, stepped up holding daughter and fell injuring left ankle

## 2016-02-26 NOTE — ED Provider Notes (Signed)
[facility] Emergency Department Provider Note   ____________________________________________   None    (approximate)  I have reviewed the triage vital signs and the nursing notes.   HISTORY  Chief Complaint Ankle Pain     HPI Shelby Obrien is a 25 y.o. female patient complaining of left ankle pain secondary to a slip and fall. Patient slipped on wet leaves. Patient stated pain increases with ambulation. Patient also mild swelling to the lateral aspect of her ankle. Patient states he is able to bear weight. No palliative measures taken prior to arrival. Patient rates the pain as a 10 over 10. Patient described a pain as "achy".  Past Medical History:  Diagnosis Date  . Asthma     There are no active problems to display for this patient.   No past surgical history on file.  Prior to Admission medications   Medication Sig Start Date End Date Taking? Authorizing Provider  chlorpheniramine (CHLOR-TRIMETON) 4 MG tablet Take 1 tablet (4 mg total) by mouth 2 (two) times daily as needed for allergies or rhinitis. 06/25/15   Charmayne Sheerharles M Beers, PA-C  EPINEPHrine (EPIPEN 2-PAK) 0.3 mg/0.3 mL IJ SOAJ injection Inject 0.3 mLs (0.3 mg total) into the muscle once. 11/12/15   Governor Rooksebecca Lord, MD  guaiFENesin-codeine 100-10 MG/5ML syrup Take 10 mLs by mouth every 4 (four) hours as needed for cough. Patient not taking: Reported on 11/12/2015 06/25/15   Charmayne Sheerharles M Beers, PA-C  ibuprofen (ADVIL,MOTRIN) 600 MG tablet Take 1 tablet (600 mg total) by mouth every 8 (eight) hours as needed. 02/26/16   Joni Reiningonald K Carols Clemence, PA-C  ibuprofen (ADVIL,MOTRIN) 800 MG tablet Take 1 tablet (800 mg total) by mouth every 8 (eight) hours as needed. Patient not taking: Reported on 11/12/2015 09/25/15   Jennye MoccasinBrian S Quigley, MD  LORazepam (ATIVAN) 1 MG tablet Take 1 tablet (1 mg total) by mouth 2 (two) times daily as needed (muscle spasm). Patient not taking: Reported on 11/12/2015 09/25/15   Jennye MoccasinBrian S Quigley,  MD  oseltamivir (TAMIFLU) 75 MG capsule Take 1 capsule (75 mg total) by mouth 2 (two) times daily. Patient not taking: Reported on 11/12/2015 06/25/15   Charmayne Sheerharles M Beers, PA-C  predniSONE (DELTASONE) 10 MG tablet Take 50 mg daily for 2 days Take 40 mg daily for 2 days Take 30 mg daily for 2 days Take 20 mg daily for 2 days Take 10 mg daily for 2 days 11/12/15   Governor Rooksebecca Lord, MD  traMADol (ULTRAM) 50 MG tablet Take 1 tablet (50 mg total) by mouth every 6 (six) hours as needed. Patient not taking: Reported on 11/12/2015 09/25/15   Jennye MoccasinBrian S Quigley, MD  traMADol (ULTRAM) 50 MG tablet Take 1 tablet (50 mg total) by mouth every 6 (six) hours as needed. 02/26/16 02/25/17  Joni Reiningonald K Oshen Wlodarczyk, PA-C    Allergies Shellfish allergy  Family History  Problem Relation Age of Onset  . Cancer Other   . Diabetes Other     Social History Social History  Substance Use Topics  . Smoking status: Current Every Day Smoker    Packs/day: 0.50  . Smokeless tobacco: Not on file  . Alcohol use No    Review of Systems Constitutional: No fever/chills Eyes: No visual changes. ENT: No sore throat. Cardiovascular: Denies chest pain. Respiratory: Denies shortness of breath. Gastrointestinal: No abdominal pain.  No nausea, no vomiting.  No diarrhea.  No constipation. Genitourinary: Negative for dysuria. Musculoskeletal: Left ankle pain Skin: Negative for rash.  Neurological: Negative for headaches, focal weakness or numbness.    ____________________________________________   PHYSICAL EXAM:  VITAL SIGNS: ED Triage Vitals  Enc Vitals Group     BP 02/26/16 1948 (!) 141/91     Pulse Rate 02/26/16 1948 85     Resp 02/26/16 1948 16     Temp 02/26/16 1948 98.3 F (36.8 C)     Temp Source 02/26/16 1948 Oral     SpO2 02/26/16 1948 100 %     Weight 02/26/16 1946 220 lb (99.8 kg)     Height 02/26/16 1946 5\' 4"  (1.626 m)     Head Circumference --      Peak Flow --      Pain Score 02/26/16 1946 10     Pain Loc  --      Pain Edu? --      Excl. in GC? --     Constitutional: Alert and oriented. Well appearing and in no acute distress. Eyes: Conjunctivae are normal. PERRL. EOMI. Head: Atraumatic. Nose: No congestion/rhinnorhea. Mouth/Throat: Mucous membranes are moist.  Oropharynx non-erythematous. Neck: No stridor.  No cervical spine tenderness to palpation. Hematological/Lymphatic/Immunilogical: No cervical lymphadenopathy. Cardiovascular: Normal rate, regular rhythm. Grossly normal heart sounds.  Good peripheral circulation. Respiratory: Normal respiratory effort.  No retractions. Lungs CTAB. Gastrointestinal: Soft and nontender. No distention. No abdominal bruits. No CVA tenderness. Musculoskeletal: No obvious deformity to the ankle. Mild edema. Patient is tender palpation at the lateral malleolus. Patient for nuchal range of motion of the ankle.  Neurologic:  Normal speech and language. No gross focal neurologic deficits are appreciated. No gait instability. Skin:  Skin is warm, dry and intact. No rash noted. Psychiatric: Mood and affect are normal. Speech and behavior are normal.  ____________________________________________   LABS (all labs ordered are listed, but only abnormal results are displayed)  Labs Reviewed - No data to display ____________________________________________  EKG   ____________________________________________  RADIOLOGY  No acute findings x-ray of the ankle. ____________________________________________   PROCEDURES  Procedure(s) performed: None  Procedures  Critical Care performed: No  ____________________________________________   INITIAL IMPRESSION / ASSESSMENT AND PLAN / ED COURSE  Pertinent labs & imaging results that were available during my care of the patient were reviewed by me and considered in my medical decision making (see chart for details).  Final sprain. Discussed x-ray finding with patient. Patient given discharge care  instructions. Patient given a prescription for tramadol and ibuprofen. Patient [facility].  Clinical Course   Patient placed in an ankle splint prior to departure.  ____________________________________________   FINAL CLINICAL IMPRESSION(S) / ED DIAGNOSES  Final diagnoses:  Sprain of left ankle, unspecified ligament, initial encounter      NEW MEDICATIONS STARTED DURING THIS VISIT:  New Prescriptions   IBUPROFEN (ADVIL,MOTRIN) 600 MG TABLET    Take 1 tablet (600 mg total) by mouth every 8 (eight) hours as needed.   TRAMADOL (ULTRAM) 50 MG TABLET    Take 1 tablet (50 mg total) by mouth every 6 (six) hours as needed.     Note:  This document was prepared using Dragon voice recognition software and may include unintentional dictation errors.    Joni Reiningonald K Mashanda Ishibashi, PA-C 02/26/16 [id number]    Phineas SemenGraydon Goodman, MD 02/26/16 [phone]

## 2016-02-29 ENCOUNTER — Encounter: Payer: Self-pay | Admitting: Emergency Medicine

## 2016-02-29 ENCOUNTER — Emergency Department
Admission: EM | Admit: 2016-02-29 | Discharge: 2016-02-29 | Disposition: A | Discharge status: Home / Self Care | Payer: Self-pay | Attending: Emergency Medicine | Admitting: Emergency Medicine

## 2016-02-29 ENCOUNTER — Emergency Department: Payer: Self-pay

## 2016-02-29 DIAGNOSIS — Z79899 Other long term (current) drug therapy: Secondary | ICD-10-CM | POA: Insufficient documentation

## 2016-02-29 DIAGNOSIS — J45909 Unspecified asthma, uncomplicated: Secondary | ICD-10-CM | POA: Insufficient documentation

## 2016-02-29 DIAGNOSIS — M940 Chondrocostal junction syndrome [Tietze]: Secondary | ICD-10-CM | POA: Insufficient documentation

## 2016-02-29 DIAGNOSIS — F17200 Nicotine dependence, unspecified, uncomplicated: Secondary | ICD-10-CM | POA: Insufficient documentation

## 2016-02-29 DIAGNOSIS — M25572 Pain in left ankle and joints of left foot: Secondary | ICD-10-CM | POA: Insufficient documentation

## 2016-02-29 MED ORDER — METHYLPREDNISOLONE 4 MG PO TBPK: ORAL_TABLET | ORAL | 0 refills | Status: DC

## 2016-02-29 NOTE — ED Notes (Signed)
Pt to ed with c/o cough and chest pain worse with movement or deep breath.  Pt appears in no respiratory distress at this time.  Pt alert and oriented and skin warm and dry.  Pt reports she has not felt well for several days.

## 2016-02-29 NOTE — ED Triage Notes (Signed)
C/o pain in chest, worse with deep breath.  Denies cough or congestion.  Also c/o left foot pain, states she heard it pop while trick or treating.  Ambulates well, no resp distress, skin w/d.  Pt coughing in triage.

## 2016-02-29 NOTE — ED Provider Notes (Signed)
[facility] Emergency Department Provider Note   ____________________________________________   First MD Initiated Contact with Patient 02/29/16 [phone]     (approximate)  I have reviewed the triage vital signs and the nursing notes.   HISTORY  Chief Complaint Chest Pain    HPI Romeo RabonJasmine M Blando is a 25 y.o. female patient complain of 2 days of upper left chest wall pain. Patient stated pain increases with deep inspirations. Patient stated pain is producible with palpation of the chest wall. Patient denies any cough or congestion. Patient denies any shortness of breath with or without exertion. No palliative measures taken for this complaint. Patient rated her chest wall pain as a 10 over 10. Patient states she continue having pain for left ankle secondary to injury which occurred 3 days ago.   Past Medical History:  Diagnosis Date  . Asthma     There are no active problems to display for this patient.   History reviewed. No pertinent surgical history.  Prior to Admission medications   Medication Sig Start Date End Date Taking? Authorizing Provider  chlorpheniramine (CHLOR-TRIMETON) 4 MG tablet Take 1 tablet (4 mg total) by mouth 2 (two) times daily as needed for allergies or rhinitis. 06/25/15   Charmayne Sheerharles M Beers, PA-C  EPINEPHrine (EPIPEN 2-PAK) 0.3 mg/0.3 mL IJ SOAJ injection Inject 0.3 mLs (0.3 mg total) into the muscle once. 11/12/15   Governor Rooksebecca Lord, MD  guaiFENesin-codeine 100-10 MG/5ML syrup Take 10 mLs by mouth every 4 (four) hours as needed for cough. Patient not taking: Reported on 11/12/2015 06/25/15   Charmayne Sheerharles M Beers, PA-C  ibuprofen (ADVIL,MOTRIN) 600 MG tablet Take 1 tablet (600 mg total) by mouth every 8 (eight) hours as needed. 02/26/16   Joni Reiningonald K Allyssia Skluzacek, PA-C  ibuprofen (ADVIL,MOTRIN) 800 MG tablet Take 1 tablet (800 mg total) by mouth every 8 (eight) hours as needed. Patient not taking: Reported on 11/12/2015 09/25/15   Jennye MoccasinBrian S Quigley, MD    LORazepam (ATIVAN) 1 MG tablet Take 1 tablet (1 mg total) by mouth 2 (two) times daily as needed (muscle spasm). Patient not taking: Reported on 11/12/2015 09/25/15   Jennye MoccasinBrian S Quigley, MD  methylPREDNISolone (MEDROL DOSEPAK) 4 MG TBPK tablet Take Tapered dose as directed 02/29/16   Joni Reiningonald K Joelle Flessner, PA-C  oseltamivir (TAMIFLU) 75 MG capsule Take 1 capsule (75 mg total) by mouth 2 (two) times daily. Patient not taking: Reported on 11/12/2015 06/25/15   Charmayne Sheerharles M Beers, PA-C  predniSONE (DELTASONE) 10 MG tablet Take 50 mg daily for 2 days Take 40 mg daily for 2 days Take 30 mg daily for 2 days Take 20 mg daily for 2 days Take 10 mg daily for 2 days 11/12/15   Governor Rooksebecca Lord, MD  traMADol (ULTRAM) 50 MG tablet Take 1 tablet (50 mg total) by mouth every 6 (six) hours as needed. Patient not taking: Reported on 11/12/2015 09/25/15   Jennye MoccasinBrian S Quigley, MD  traMADol (ULTRAM) 50 MG tablet Take 1 tablet (50 mg total) by mouth every 6 (six) hours as needed. 02/26/16 02/25/17  Joni Reiningonald K Wilmar Prabhakar, PA-C    Allergies Shellfish allergy  Family History  Problem Relation Age of Onset  . Cancer Other   . Diabetes Other     Social History Social History  Substance Use Topics  . Smoking status: Current Every Day Smoker    Packs/day: 0.50  . Smokeless tobacco: Never Used  . Alcohol use No    Review of Systems Constitutional: No fever/chills  Eyes: No visual changes. ENT: No sore throat. Cardiovascular: Denies chest pain. Respiratory: Denies shortness of breath. Gastrointestinal: No abdominal pain.  No nausea, no vomiting.  No diarrhea.  No constipation. Genitourinary: Negative for dysuria. Musculoskeletal:Left ankle pain. Left upper chest wall pain.  Skin: Negative for rash. Neurological: Negative for headaches, focal weakness or numbness.    ____________________________________________   PHYSICAL EXAM:  VITAL SIGNS: ED Triage Vitals  Enc Vitals Group     BP 02/29/16 0914 (!) 142/71     Pulse Rate  02/29/16 0914 76     Resp 02/29/16 0914 18     Temp 02/29/16 0914 98.4 F (36.9 C)     Temp Source 02/29/16 0914 Oral     SpO2 02/29/16 0914 98 %     Weight 02/29/16 0912 220 lb (99.8 kg)     Height 02/29/16 0912 5\' 4"  (1.626 m)     Head Circumference --      Peak Flow --      Pain Score 02/29/16 0912 10     Pain Loc --      Pain Edu? --      Excl. in GC? --     Constitutional: Alert and oriented. Well appearing and in no acute distress. Eyes: Conjunctivae are normal. PERRL. EOMI. Head: Atraumatic. Nose: No congestion/rhinnorhea. Mouth/Throat: Mucous membranes are moist.  Oropharynx non-erythematous. Neck: No stridor. No cervical spine tenderness to palpation. Hematological/Lymphatic/Immunilogical: No cervical lymphadenopathy. Cardiovascular: Normal rate, regular rhythm. Grossly normal heart sounds.  Good peripheral circulation. Respiratory: Normal respiratory effort.  No retractions. Lungs CTAB. Gastrointestinal: Soft and nontender. No distention. No abdominal bruits. No CVA tenderness. Musculoskeletal: No lower extremity tenderness nor edema.  No joint effusions. Neurologic:  Normal speech and language. No gross focal neurologic deficits are appreciated. No gait instability. Skin:  Skin is warm, dry and intact. No rash noted. Psychiatric: Mood and affect are normal. Speech and behavior are normal.  ____________________________________________   LABS (all labs ordered are listed, but only abnormal results are displayed)  Labs Reviewed - No data to display ____________________________________________  EKG  EKG read by our Station Drs. with no acute findings. ____________________________________________  RADIOLOGY  Chest x-ray with no acute findings. ____________________________________________   PROCEDURES  Procedure(s) performed: None  Procedures  Critical Care performed: No  ____________________________________________   INITIAL IMPRESSION / ASSESSMENT  AND PLAN / ED COURSE  Pertinent labs & imaging results that were available during my care of the patient were reviewed by me and considered in my medical decision making (see chart for details).  Chest wall pain. Discuss EKG and x-ray findings with patient. Patient given discharge care instructions. Patient advised continue taking medications and [facility] condition persists.  Clinical Course     ____________________________________________   FINAL CLINICAL IMPRESSION(S) / ED DIAGNOSES  Final diagnoses:  Costochondritis      NEW MEDICATIONS STARTED DURING THIS VISIT:  New Prescriptions   METHYLPREDNISOLONE (MEDROL DOSEPAK) 4 MG TBPK TABLET    Take Tapered dose as directed     Note:  This document was prepared using Dragon voice recognition software and may include unintentional dictation errors.    Joni Reiningonald K Diva Lemberger, PA-C 02/29/16 1027    Jeanmarie PlantJames A McShane, MD 02/29/16 1536

## 2016-02-29 NOTE — ED Notes (Signed)
EKG shows NSR

## 2016-10-07 ENCOUNTER — Ambulatory Visit (INDEPENDENT_AMBULATORY_CARE_PROVIDER_SITE_OTHER): Payer: Commercial Managed Care - PPO | Admitting: Family Medicine

## 2016-10-07 ENCOUNTER — Encounter: Payer: Self-pay | Admitting: Family Medicine

## 2016-10-07 VITALS — BP 132/88 | Temp 98.1°F | Ht 64.0 in | Wt 272.0 lb

## 2016-10-07 DIAGNOSIS — N926 Irregular menstruation, unspecified: Secondary | ICD-10-CM | POA: Diagnosis not present

## 2016-10-07 DIAGNOSIS — J4520 Mild intermittent asthma, uncomplicated: Secondary | ICD-10-CM

## 2016-10-07 DIAGNOSIS — G43909 Migraine, unspecified, not intractable, without status migrainosus: Secondary | ICD-10-CM | POA: Insufficient documentation

## 2016-10-07 DIAGNOSIS — Z0000 Encounter for general adult medical examination without abnormal findings: Secondary | ICD-10-CM | POA: Diagnosis not present

## 2016-10-07 DIAGNOSIS — G43009 Migraine without aura, not intractable, without status migrainosus: Secondary | ICD-10-CM

## 2016-10-07 DIAGNOSIS — J45909 Unspecified asthma, uncomplicated: Secondary | ICD-10-CM | POA: Insufficient documentation

## 2016-10-07 MED ORDER — ALBUTEROL SULFATE HFA 108 (90 BASE) MCG/ACT IN AERS: 2.0000 | INHALATION_SPRAY | RESPIRATORY_TRACT | 5 refills | Status: DC | PRN

## 2016-10-07 MED ORDER — SUMATRIPTAN SUCCINATE 100 MG PO TABS: 100.0000 mg | ORAL_TABLET | ORAL | 11 refills | Status: DC | PRN

## 2016-10-07 NOTE — Progress Notes (Signed)
   Subjective:    Patient ID: Shelby Obrien, female    DOB: 23-Nov-1990, 26 y.o.   MRN: [id number]  HPI 26 yr old female to establish with us for primary care. She was in the ER on 09-24-16 for chest pains and her work up included labs, EKG, d dimer, etc. All of these were normal. She was diagnosed with chest wall pain and in fact she feels much better now . No SOB. She has a hx of asthma that usually bothers her most in the early spring when the polen counts are high. She has an old inhaler at home. She also mentions frequent headaches. These can be severe, they causing a global throbbing headache that causes nausea but no vomiting. Bright lights bother her at these times. She usually takes Ibuprofen or Excedrin with mixed results. Of note she switched from BCP to Implanon, which was implanted in her arm 2 years ago. Her menses stopped for a year and a half, but now she is starting to have light menses again. Her headaches have been getting more frequent, now averaging 2 a week. Her mother and sister have migraines.   Review of Systems  Constitutional: Negative.   Respiratory: Negative.   Cardiovascular: Negative.   Gastrointestinal: Negative.   Neurological: Positive for headaches. Negative for dizziness, tremors, seizures, syncope, facial asymmetry, speech difficulty, weakness, light-headedness and numbness.       Objective:   Physical Exam  Constitutional: She is oriented to person, place, and time. She appears well-developed and well-nourished.  Neck: No thyromegaly present.  Cardiovascular: Normal rate, regular rhythm, normal heart sounds and intact distal pulses.   Pulmonary/Chest: Breath sounds normal. No respiratory distress. She has no wheezes. She has no rales. She exhibits no tenderness.  Lymphadenopathy:    She has no cervical adenopathy.  Neurological: She is alert and oriented to person, place, and time. No cranial nerve deficit. She exhibits normal muscle tone.  Coordination normal.          Assessment & Plan:  Introductory visit with this patient who has stable asthma. I refilled her albuterol rescue inhaler. She had a recent bout of costochondritis which has resolved. She is unhappy with the Implanon and she plans to have her GYN remove this soon. She is having migraines, and she will try Imitrex 100 mg tablets prn. We may want to discuss trying a daily prophylactic medication at some point, but I think removing the Implanon may help.  Gershon CraneStephen Madisyn Mawhinney, MD

## 2016-10-07 NOTE — Patient Instructions (Signed)
WE NOW OFFER   Shelby Obrien's FAST TRACK!!!  SAME DAY Appointments for ACUTE CARE  Such as: Sprains, Injuries, cuts, abrasions, rashes, muscle pain, joint pain, back pain Colds, flu, sore throats, headache, allergies, cough, fever  Ear pain, sinus and eye infections Abdominal pain, nausea, vomiting, diarrhea, upset stomach Animal/insect bites  3 Easy Ways to Schedule: Walk-In Scheduling Call in scheduling Mychart Sign-up: [email or web address]         

## 2016-10-27 ENCOUNTER — Encounter: Payer: Self-pay | Admitting: Advanced Practice Midwife

## 2016-10-27 ENCOUNTER — Ambulatory Visit (INDEPENDENT_AMBULATORY_CARE_PROVIDER_SITE_OTHER): Payer: Commercial Managed Care - PPO | Admitting: Advanced Practice Midwife

## 2016-10-27 VITALS — BP 124/70 | Ht 64.0 in | Wt 264.0 lb

## 2016-10-27 DIAGNOSIS — Z30015 Encounter for initial prescription of vaginal ring hormonal contraceptive: Secondary | ICD-10-CM

## 2016-10-27 DIAGNOSIS — Z01419 Encounter for gynecological examination (general) (routine) without abnormal findings: Secondary | ICD-10-CM

## 2016-10-27 DIAGNOSIS — Z124 Encounter for screening for malignant neoplasm of cervix: Secondary | ICD-10-CM

## 2016-10-27 MED ORDER — ETONOGESTREL-ETHINYL ESTRADIOL 0.12-0.015 MG/24HR VA RING: VAGINAL_RING | VAGINAL | 11 refills | Status: DC

## 2016-10-27 NOTE — Progress Notes (Signed)
Patient ID: Shelby Obrien, female   DOB: 1990/06/25, 26 y.o.   MRN: [id number]     Gynecology Annual Exam  PCP: Nelwyn Salisbury, MD  Chief Complaint:  Chief Complaint  Patient presents with  . Annual Exam    History of Present Illness: Patient is a 26 y.o. G2P1011 presents for annual exam. The patient has complaints today of increased frequency of menstrual cycles. She is requesting removal of Nexplanon and wants to use Nuvaring instead.    LMP: 10/20/2016 Average Interval: regular, 14 days Duration of flow: 7-14 days days Heavy Menses: no Clots: yes Intermenstrual Bleeding: no Postcoital Bleeding: no Dysmenorrhea: no  The patient is not currently sexually active. She has been using Nexplanon for contraception and is requesting a switch to Nuvaring. She denies dyspareunia.  The patient does not perform self breast exams.  There is no notable family history of breast or ovarian cancer in her family.  The patient wears seatbelts: yes.   The patient has regular exercise: no.    The patient denies current symptoms of depression.    Review of Systems: Review of Systems  Constitutional: Negative.   HENT: Negative.   Eyes: Negative.   Respiratory: Negative.   Cardiovascular: Negative.   Gastrointestinal: Negative.   Genitourinary: Negative.   Musculoskeletal: Negative.   Skin: Negative.   Neurological: Negative.   Endo/Heme/Allergies: Negative.   Psychiatric/Behavioral: Negative.     Past Medical History:  Past Medical History:  Diagnosis Date  . Asthma   . Migraines     Past Surgical History:  Past Surgical History:  Procedure Laterality Date  . WISDOM TOOTH EXTRACTION      Gynecologic History:  Last Pap: 2 years ago Results were: normal   Obstetric History: G2P1011  Family History:  Family History  Problem Relation Age of Onset  . Cancer Other   . Diabetes Other     Social History:  Social History   Social History  . Marital status:  Single    Spouse name: N/A  . Number of children: N/A  . Years of education: N/A   Occupational History  . Not on file.   Social History Main Topics  . Smoking status: Current Every Day Smoker    Packs/day: 0.50  . Smokeless tobacco: Never Used  . Alcohol use No  . Drug use: No  . Sexual activity: Yes   Other Topics Concern  . Not on file   Social History Narrative  . No narrative on file    Allergies:  Allergies  Allergen Reactions  . Shellfish Allergy Anaphylaxis    Medications: Prior to Admission medications   Medication Sig Start Date End Date Taking? Authorizing Provider  albuterol (PROVENTIL HFA;VENTOLIN HFA) 108 (90 Base) MCG/ACT inhaler Inhale 2 puffs into the lungs every 4 (four) hours as needed for wheezing or shortness of breath. 10/07/16  Yes Nelwyn Salisbury, MD  SUMAtriptan (IMITREX) 100 MG tablet Take 1 tablet (100 mg total) by mouth as needed for migraine. May repeat in 2 hours if headache persists or recurs. 10/07/16  Yes Nelwyn Salisbury, MD  etonogestrel-ethinyl estradiol (NUVARING) 0.12-0.015 MG/24HR vaginal ring Insert vaginally and leave in place for 3 consecutive weeks, then remove for 1 week. 10/27/16   Tresea Mall, CNM    Physical Exam Vitals: Blood pressure 124/70, height 5\' 4"  (1.626 m), weight 264 lb (119.7 kg).  General: NAD HEENT: normocephalic, anicteric Thyroid: no enlargement, no palpable nodules Pulmonary: No increased work of  breathing, CTAB Cardiovascular: RRR, distal pulses 2+ Breast: Breast symmetrical, no tenderness, no palpable nodules or masses, no skin or nipple retraction present, no nipple discharge.  No axillary or supraclavicular lymphadenopathy. Abdomen: NABS, soft, non-tender, non-distended.  Umbilicus without lesions.  No hepatomegaly, splenomegaly or masses palpable. No evidence of hernia  Genitourinary:  External: Normal external female genitalia.  Normal urethral meatus, normal  Bartholin's and Skene's glands.    Vagina:  Normal vaginal mucosa, no evidence of prolapse.    Cervix: Grossly normal in appearance, no bleeding, no CMT  Uterus: Non-enlarged, mobile, normal contour.    Adnexa: ovaries non-enlarged, no adnexal masses  Rectal: deferred  Lymphatic: no evidence of inguinal lymphadenopathy Extremities: no edema, erythema, or tenderness Neurologic: Grossly intact Psychiatric: mood appropriate, affect full    Assessment: 26 y.o. G2P1011 Well woman exam with PAP smear  Plan: Problem List Items Addressed This Visit    None    Visit Diagnoses    Well woman exam with routine gynecological exam    -  Primary   Relevant Orders   IGP, rfx Aptima HPV ASCU   Cervical cancer screening       Relevant Orders   IGP, rfx Aptima HPV ASCU   Encounter for initial prescription of vaginal ring hormonal contraceptive       Relevant Medications   etonogestrel-ethinyl estradiol (NUVARING) 0.12-0.015 MG/24HR vaginal ring      1) STI screening was offered and declined  2) ASCCP guidelines and rational discussed.  Patient opts for yearly screening interval  3) Contraception - Removal of Nexplanon, Rx for Nuvaring  4) Routine healthcare maintenance including cholesterol, diabetes screening discussed managed by PCP   5) Increase healthy lifestyle diet and exercise  6) Follow up 1 year for routine annual exam  Tresea MallJane Jedidiah Demartini, CNM        GYNECOLOGY PROCEDURE NOTE  Nexplanon removal discussed in detail.  Risks of infection, bleeding, nerve injury all reviewed.  Patient understands risks and desires to proceed.  Verbal consent obtained.  Patient is certain she wants the Nexplanon removed.  All questions answered.  Procedure: Patient placed in dorsal supine with left arm above head, elbow flexed at 90 degrees, arm resting on examination table.  Nexplanon identified without problems.  Hibiclens scrub x2.  1.5 ml of 1% lidocaine injected under Nexplanon device without problems.  Sterile gloves applied.  Small  0.5cm incision made at distal tip of Nexplanon device with 11 blade scalpel.  Nexplanon brought to incision and grasped with a small kelly clamp.  Nexplanon removed intact without problems.  Pressure applied to incision.  Hemostasis obtained.  Steri-strips applied, followed by bandage and compression dressing.  Patient tolerated procedure well.  No complications.   Assessment: 26 y.o. year old female now s/p uncomplicated Nexplanon removal.  Plan: 1.  Patient given post procedure precautions and asked to call for fever, chills, redness or drainage from her incision, bleeding from incision.  She understands she will likely have a small bruise near site of removal and can remove bandage tomorrow and steri-strips in approximately 1 week.  Tresea MallJane Brynnly Bonet, CNM

## 2016-10-28 LAB — IGP, RFX APTIMA HPV ASCU: PAP Smear Comment: 0

## 2016-11-03 ENCOUNTER — Encounter: Payer: Self-pay | Admitting: Family Medicine

## 2016-11-03 ENCOUNTER — Ambulatory Visit (INDEPENDENT_AMBULATORY_CARE_PROVIDER_SITE_OTHER): Payer: Commercial Managed Care - PPO | Admitting: Family Medicine

## 2016-11-03 VITALS — Temp 98.5°F | Ht 64.0 in | Wt 272.0 lb

## 2016-11-03 DIAGNOSIS — J018 Other acute sinusitis: Secondary | ICD-10-CM | POA: Diagnosis not present

## 2016-11-03 MED ORDER — AZITHROMYCIN 250 MG PO TABS: ORAL_TABLET | ORAL | 0 refills | Status: DC

## 2016-11-03 NOTE — Progress Notes (Signed)
   Subjective:    Patient ID: Shelby Obrien, female    DOB: 03/15/1991, 26 y.o.   MRN: [id number]  HPI Here for 2 days of stuffy head, ear pressure, PND, and a ST. No cough. Using Nyquil.    Review of Systems  Constitutional: Positive for chills. Negative for fever.  HENT: Positive for congestion, hearing loss, postnasal drip, sinus pain, sinus pressure and sore throat. Negative for ear pain.   Eyes: Negative.   Respiratory: Negative.        Objective:   Physical Exam  Constitutional: She appears well-developed and well-nourished.  HENT:  Right Ear: External ear normal.  Left Ear: External ear normal.  Nose: Nose normal.  Mouth/Throat: Oropharynx is clear and moist.  Eyes: Conjunctivae are normal.  Neck: Neck supple. No thyromegaly present.  Pulmonary/Chest: Effort normal and breath sounds normal. No respiratory distress. She has no wheezes. She has no rales.  Lymphadenopathy:    She has no cervical adenopathy.          Assessment & Plan:  Sinusitis, treat with a Zpack. Add Mucinex prn. Written out of work for today.  Gershon CraneStephen Ewing Fandino, MD

## 2016-11-03 NOTE — Patient Instructions (Signed)
WE NOW OFFER   [facility] FAST TRACK!!!  SAME DAY Appointments for ACUTE CARE  Such as: Sprains, Injuries, cuts, abrasions, rashes, muscle pain, joint pain, back pain Colds, flu, sore throats, headache, allergies, cough, fever  Ear pain, sinus and eye infections Abdominal pain, nausea, vomiting, diarrhea, upset stomach Animal/insect bites  3 Easy Ways to Schedule: Walk-In Scheduling Call in scheduling Mychart Sign-up: [email or web address]         

## 2016-11-25 ENCOUNTER — Emergency Department
Admission: EM | Admit: 2016-11-25 | Discharge: 2016-11-25 | Disposition: A | Discharge status: Home / Self Care | Payer: Commercial Managed Care - PPO | Attending: Student in an Organized Health Care Education/Training Program | Admitting: Student in an Organized Health Care Education/Training Program

## 2016-11-25 ENCOUNTER — Encounter: Payer: Self-pay | Admitting: Emergency Medicine

## 2016-11-25 DIAGNOSIS — R1084 Generalized abdominal pain: Secondary | ICD-10-CM

## 2016-11-25 DIAGNOSIS — M791 Myalgia: Secondary | ICD-10-CM | POA: Insufficient documentation

## 2016-11-25 DIAGNOSIS — R51 Headache: Secondary | ICD-10-CM | POA: Diagnosis present

## 2016-11-25 DIAGNOSIS — R519 Headache, unspecified: Secondary | ICD-10-CM

## 2016-11-25 DIAGNOSIS — F17200 Nicotine dependence, unspecified, uncomplicated: Secondary | ICD-10-CM | POA: Insufficient documentation

## 2016-11-25 DIAGNOSIS — G44099 Other trigeminal autonomic cephalgias (TAC), not intractable: Secondary | ICD-10-CM | POA: Diagnosis not present

## 2016-11-25 DIAGNOSIS — Z79899 Other long term (current) drug therapy: Secondary | ICD-10-CM | POA: Insufficient documentation

## 2016-11-25 DIAGNOSIS — J45909 Unspecified asthma, uncomplicated: Secondary | ICD-10-CM | POA: Insufficient documentation

## 2016-11-25 DIAGNOSIS — R079 Chest pain, unspecified: Secondary | ICD-10-CM | POA: Diagnosis not present

## 2016-11-25 LAB — CBC WITH DIFFERENTIAL/PLATELET
BASOS ABS: 0 10*3/uL (ref 0–0.1)
Basophils Relative: 1 %
Eosinophils Absolute: 0.1 10*3/uL (ref 0–0.7)
Eosinophils Relative: 1 %
HEMATOCRIT: 36.4 % (ref 35.0–47.0)
Hemoglobin: 12.7 g/dL (ref 12.0–16.0)
LYMPHS ABS: 1.8 10*3/uL (ref 1.0–3.6)
LYMPHS PCT: 19 %
MCH: 29.9 pg (ref 26.0–34.0)
MCHC: 34.9 g/dL (ref 32.0–36.0)
MCV: 85.8 fL (ref 80.0–100.0)
Monocytes Absolute: 0.3 10*3/uL (ref 0.2–0.9)
Monocytes Relative: 3 %
Neutro Abs: 7.1 10*3/uL — ABNORMAL HIGH (ref 1.4–6.5)
Neutrophils Relative %: 76 %
Platelets: 259 10*3/uL (ref 150–440)
RBC: 4.25 MIL/uL (ref 3.80–5.20)
RDW: 15.4 % — ABNORMAL HIGH (ref 11.5–14.5)
WBC: 9.3 10*3/uL (ref 3.6–11.0)

## 2016-11-25 LAB — COMPREHENSIVE METABOLIC PANEL
ALT: 17 U/L (ref 14–54)
AST: 23 U/L (ref 15–41)
Albumin: 3.9 g/dL (ref 3.5–5.0)
Alkaline Phosphatase: 96 U/L (ref 38–126)
Anion gap: 7 (ref 5–15)
BILIRUBIN TOTAL: 0.6 mg/dL (ref 0.3–1.2)
BUN: 18 mg/dL (ref 6–20)
CALCIUM: 9 mg/dL (ref 8.9–10.3)
CO2: 24 mmol/L (ref 22–32)
CREATININE: 0.89 mg/dL (ref 0.44–1.00)
Chloride: 108 mmol/L (ref 101–111)
GFR calc Af Amer: >60 mL/min (ref >60)
Glucose, Bld: 112 mg/dL — ABNORMAL HIGH (ref 65–99)
Potassium: 3.6 mmol/L (ref 3.5–5.1)
Sodium: 139 mmol/L (ref 135–145)
TOTAL PROTEIN: 7.8 g/dL (ref 6.5–8.1)

## 2016-11-25 LAB — LIPASE, BLOOD: LIPASE: 24 U/L (ref 11–51)

## 2016-11-25 LAB — HCG, QUANTITATIVE, PREGNANCY: hCG, Beta Chain, Quant, S: 1 m[IU]/mL (ref <5)

## 2016-11-25 MED ORDER — PROCHLORPERAZINE EDISYLATE 5 MG/ML IJ SOLN
10.0000 mg | Freq: Once | INTRAMUSCULAR | Status: AC
  Administered 2016-11-25: 10 mg via INTRAVENOUS
  Filled 2016-11-25 (×2): qty 2

## 2016-11-25 MED ORDER — SODIUM CHLORIDE 0.9 % IV BOLUS (SEPSIS)
1000.0000 mL | Freq: Once | INTRAVENOUS | Status: AC
  Administered 2016-11-25: 1000 mL via INTRAVENOUS

## 2016-11-25 MED ORDER — DIPHENHYDRAMINE HCL 50 MG/ML IJ SOLN
12.5000 mg | Freq: Once | INTRAMUSCULAR | Status: AC
  Administered 2016-11-25: 12.5 mg via INTRAVENOUS
  Filled 2016-11-25: qty 1

## 2016-11-25 MED ORDER — KETOROLAC TROMETHAMINE 30 MG/ML IJ SOLN
15.0000 mg | Freq: Once | INTRAMUSCULAR | Status: AC
  Administered 2016-11-25: 15 mg via INTRAVENOUS

## 2016-11-25 NOTE — ED Triage Notes (Addendum)
Pt c/o headache that started today.  Hx migraines; Took her somatotropin but it is not helping.  Started having generalized body aches after taking her medicine but normally does not have this reaction.  Photophobia. Has had some nausea.

## 2016-11-25 NOTE — Discharge Instructions (Signed)
As discussed in the emergency department, you may use Tylenol and/or Ibuprofen for headaches. These are "Over the Counter" medications and can be found at most drug stores and grocery stores. Please use the recommended dosing instructions on the bottle/box. Do not exceed the maximum dose for either medications. Please be sure to rest and drink plenty of fluids. Please be sure to call your PCP for a follow-up visit, especially if your headaches persist.  Please call your physician or return to ED if you have: 1. Worsening or change in headaches. 2. Changes in vision. 3. New-onset nausea and vomiting. 4. Numbness, tingling, weakness in your extremities,. 4. Inability to eat or drink adequate amounts of food or liquids. 5. Chest pain, shortness of breath, or difficulty breathing. 6. Neurological changes- dizziness, fainting, loss of function of your arms, legs or other parts of your body. 7. Uncontrolled hypertension. 8. Or any other emergent concerns. You have been seen in the emergency department for emergency care. It is important that you contact your own doctor, specialist or the closest clinic for follow-up care. Please bring this instruction sheet, all medications and X-ray copies with you when you are seen for follow-up care.  Determining the exact cause for all patients with abdominal pain is extremely difficult in the emergency department. Our primary focus is to rule-out immediate life-threatening diseases. If no immediate source of pain is found the definitive diagnosis frequently needs to be determined over time.Many times your primary care physician can determine the cause by following the symptoms over time. Sometimes, specialist are required such as Gastroenterologists, Gynecologists, Urologists or Surgeons. Please return immediately to the Emergency Department for fever>101, Vomiting or Intractable Pain. You should return to the emergency department or see your primary care provider in  12-24hrs if your pain is no better and sooner if your pain becomes worse.  

## 2016-11-25 NOTE — ED Provider Notes (Signed)
[facility] Emergency Department Provider Note    First MD Initiated Contact with Patient 11/25/16 2122     (approximate)  I have reviewed the triage vital signs and the nursing notes.   HISTORY  Chief Complaint Headache    HPI Shelby Obrien is a 26 y.o. female history of migraines presents with a chief complaint of migraine headache without improvement after taking her migraine medication at home earlier today. No associated fevers. States that she is developing diffuse myalgias and achiness all over her body after taking the medication. States that she's taken his medication the past without any complications. She takes Imitrex for history of migraines and felt that this headache was similar to previous. Denies any fevers. No nausea or vomiting. States that she's having chest pain neck pain, arm pain, abdominal pain. All this started shortly prior to arrival. Denies any chance of being pregnant.   Past Medical History:  Diagnosis Date  . Asthma   . Migraines    Family History  Problem Relation Age of Onset  . Cancer Other   . Diabetes Other    Past Surgical History:  Procedure Laterality Date  . WISDOM TOOTH EXTRACTION     Patient Active Problem List   Diagnosis Date Noted  . Migraines 10/07/2016  . Asthma 10/07/2016      Prior to Admission medications   Medication Sig Start Date End Date Taking? Authorizing Provider  albuterol (PROVENTIL HFA;VENTOLIN HFA) 108 (90 Base) MCG/ACT inhaler Inhale 2 puffs into the lungs every 4 (four) hours as needed for wheezing or shortness of breath. 10/07/16   Nelwyn SalisburyFry, Stephen A, MD  azithromycin [facility](ZITHROMAX) 250 MG tablet As directed 11/03/16   Nelwyn SalisburyFry, Stephen A, MD  etonogestrel-ethinyl estradiol (NUVARING) 0.12-0.015 MG/24HR vaginal ring Insert vaginally and leave in place for 3 consecutive weeks, then remove for 1 week. 10/27/16   Tresea MallGledhill, Jane, CNM  SUMAtriptan (IMITREX) 100 MG tablet Take 1 tablet (100 mg  total) by mouth as needed for migraine. May repeat in 2 hours if headache persists or recurs. 10/07/16   Nelwyn SalisburyFry, Stephen A, MD    Allergies Shellfish allergy    Social History Social History  Substance Use Topics  . Smoking status: Current Every Day Smoker    Packs/day: 0.50  . Smokeless tobacco: Never Used  . Alcohol use No    Review of Systems Patient denies headaches, rhinorrhea, blurry vision, numbness, shortness of breath, chest pain, edema, cough, abdominal pain, nausea, vomiting, diarrhea, dysuria, fevers, rashes or hallucinations unless otherwise stated above in HPI. ____________________________________________   PHYSICAL EXAM:  VITAL SIGNS: Vitals:   11/25/16 2107  BP: (!) 148/89  Pulse: 78  Resp: 18  Temp: 98.5 F (36.9 C)    Constitutional: Alert and oriented. Well appearing and in no acute distress. Eyes: Conjunctivae are normal.  Head: Atraumatic. Nose: No congestion/rhinnorhea. Mouth/Throat: Mucous membranes are moist.   Neck: No stridor. Painless ROM.  Cardiovascular: Normal rate, regular rhythm. Grossly normal heart sounds.  Good peripheral circulation. Respiratory: Normal respiratory effort.  No retractions. Lungs CTAB. Gastrointestinal: Soft with no guarding or rebound. No distention. No abdominal bruits. No CVA tenderness. Musculoskeletal: No lower extremity tenderness nor edema.  No joint effusions. Neurologic:  CN- intact.  No facial droop, Normal FNF.  Normal heel to shin.  Sensation intact bilaterally. Normal speech and language. No gross focal neurologic deficits are appreciated. No gait instability. Skin:  Skin is warm, dry and intact. No rash noted. Psychiatric: Mood  and affect are normal. Speech and behavior are normal.  ____________________________________________   LABS (all labs ordered are listed, but only abnormal results are displayed)  No results found for this or any previous visit (from the past 24  hour(s)). ____________________________________________  EKG My review and personal interpretation at Time: 21:53   Indication: chest pain  Rate: 80  Rhythm: sinus Axis: normal Other: normal intervals, no stemi, no depressions ____________________________________________  RADIOLOGY ____________________________________________   PROCEDURES  Procedure(s) performed:  Procedures    Critical Care performed: no ____________________________________________   INITIAL IMPRESSION / ASSESSMENT AND PLAN / ED COURSE  Pertinent labs & imaging results that were available during my care of the patient were reviewed by me and considered in my medical decision making (see chart for details).  DDX: migraine, tension, dehydration, viral illness, unlikely meningitis, encephalitis,   Shelby Obrien is a 26 y.o. who presents to the ED with with Hx of migraines p/w HA , chestpain and abdominal pain. Not worst HA ever. Gradual onset. HA similar to previous episodes. Denies focal neurologic symptoms. Denies trauma. No fevers or neck pain. No vision loss. Afebrile in ED. VSS. Exam as above. No meningeal signs. No CN, motor, sensory or cerebellar deficits. Temporal arteries palpable and non-tender. Appears well and non-toxic.  abd exam soft and benign.  Not consistent with acs, EKG without eevidence of acute ischemia.  Breathsounds equal bilaterally.  Not consistent with pna, ptx, pe.  Will provide IV fluids for hydration and IV medications for symptom control. Clinical picture is not consistent with ICH, SAH, SDH, EDH, TIA, or CVA. No concern for meningitis or encephalitis. No concern for GCA/Temporal arteritis.   ----------------------------------------- 10:26 PM on 11/25/2016 -----------------------------------------   Pain improved. Repeat neuro exam is again without focal deficit, nuchal rigidity or evidence of meningeal irritation.  Repeat abd exam soft and benign.  Blood work reassuring.   Stable to D/C home, follow up with PCP or Neurology if persistent recurrent Has.  Have discussed with the patient and available family all diagnostics and treatments performed thus far and all questions were answered to the best of my ability. The patient demonstrates understanding and agreement with plan.        ____________________________________________   FINAL CLINICAL IMPRESSION(S) / ED DIAGNOSES  Final diagnoses:  Acute nonintractable headache, unspecified headache type  Chest pain, unspecified type  Generalized abdominal pain  4    NEW MEDICATIONS STARTED DURING THIS VISIT:  New Prescriptions   No medications on file     Note:  This document was prepared using Dragon voice recognition software and may include unintentional dictation errors.    Willy Eddyobinson, Hermenegildo Clausen, MD 11/25/16 2226

## 2016-11-28 ENCOUNTER — Telehealth: Payer: Self-pay | Admitting: Family Medicine

## 2016-11-28 NOTE — Telephone Encounter (Signed)
Pt would like to have a Tdap vaccine is the pt eligible for this vaccine?

## 2016-11-28 NOTE — Telephone Encounter (Signed)
According to chart, patient is due for Tdap.   Okay for nurse visit?

## 2016-11-28 NOTE — Telephone Encounter (Signed)
Patient can come in for a nurse visit for the tdap. Can we get her scheduled?  Thanks!

## 2016-11-28 NOTE — Telephone Encounter (Signed)
Yes, nurse visit for a TDaP

## 2016-11-28 NOTE — Telephone Encounter (Signed)
Pt has been scheduled for the Tdap vaccine.

## 2016-12-09 ENCOUNTER — Ambulatory Visit: Payer: Commercial Managed Care - PPO

## 2016-12-10 ENCOUNTER — Encounter: Payer: Self-pay | Admitting: Neurology

## 2016-12-10 ENCOUNTER — Encounter: Payer: Self-pay | Admitting: Family Medicine

## 2016-12-10 ENCOUNTER — Ambulatory Visit (INDEPENDENT_AMBULATORY_CARE_PROVIDER_SITE_OTHER): Payer: Commercial Managed Care - PPO | Admitting: Family Medicine

## 2016-12-10 VITALS — BP 120/82 | HR 83 | Temp 98.3°F | Wt 268.8 lb

## 2016-12-10 DIAGNOSIS — M791 Myalgia, unspecified site: Secondary | ICD-10-CM

## 2016-12-10 DIAGNOSIS — G43009 Migraine without aura, not intractable, without status migrainosus: Secondary | ICD-10-CM | POA: Diagnosis not present

## 2016-12-10 DIAGNOSIS — F418 Other specified anxiety disorders: Secondary | ICD-10-CM | POA: Diagnosis not present

## 2016-12-10 MED ORDER — KETOROLAC TROMETHAMINE 60 MG/2ML IM SOLN
60.0000 mg | Freq: Once | INTRAMUSCULAR | Status: AC
  Administered 2016-12-10: 60 mg via INTRAMUSCULAR

## 2016-12-10 MED ORDER — PROMETHAZINE HCL 25 MG PO TABS: 25.0000 mg | ORAL_TABLET | Freq: Three times a day (TID) | ORAL | 0 refills | Status: DC | PRN

## 2016-12-10 NOTE — Progress Notes (Signed)
Subjective:    Patient ID: Shelby Obrien, female    DOB: June 11, 1990, 26 y.o.   MRN: [id number]  Chief Complaint  Patient presents with  . Migraine  . Shortness of Breath  . Chest Pain    HPI  Patient is seen today for acute visit.  Migraine HA:  -recent visit to est care, started on Imitrex 100 mg -[facility]13 for migraine-- L temple pain also on L side of head going down neck into L arm.   (records reviewed via care everwhere) -O/N pt endorses mild HA, N/V, SOB, chest pain. -4/10 pain today, -Has tried Excedrin, Sumatriptan, Ibuprofen -Pt endorses having frequent HA while on nexplanon.  Now on Nuvaring but removed a few days ago. -Frequent HA  First noted in pt's early 4120s  FHx: Mother and sister both have migraines.  Past Medical History:  Diagnosis Date  . Asthma   . Migraines     Past Surgical History:  Procedure Laterality Date  . WISDOM TOOTH EXTRACTION      Family History  Problem Relation Age of Onset  . Cancer Other   . Diabetes Other     Social History   Social History  . Marital status: Single    Spouse name: N/A  . Number of children: N/A  . Years of education: N/A   Occupational History  . Not on file.   Social History Main Topics  . Smoking status: Current Every Day Smoker    Packs/day: 0.50  . Smokeless tobacco: Never Used  . Alcohol use No  . Drug use: No  . Sexual activity: Yes   Other Topics Concern  . Not on file   Social History Narrative  . No narrative on file    Outpatient Medications Prior to Visit  Medication Sig Dispense Refill  . albuterol (PROVENTIL HFA;VENTOLIN HFA) 108 (90 Base) MCG/ACT inhaler Inhale 2 puffs into the lungs every 4 (four) hours as needed for wheezing or shortness of breath. 1 Inhaler 5  . etonogestrel-ethinyl estradiol (NUVARING) 0.12-0.015 MG/24HR vaginal ring Insert vaginally and leave in place for 3 consecutive weeks, then remove for 1 week. 1 each 11  .  SUMAtriptan (IMITREX) 100 MG tablet Take 1 tablet (100 mg total) by mouth as needed for migraine. May repeat in 2 hours if headache persists or recurs. 10 tablet 11  . azithromycin (ZITHROMAX) 250 MG tablet As directed 6 tablet 0   No facility-administered medications prior to visit.     Allergies  Allergen Reactions  . Shellfish Allergy Anaphylaxis    ROS   General: Denies fever, chills, night sweats, changes in weight, changes in appetite HEENT: Denies ear pain, changes in vision, rhinorrhea, sore throat, or sensitivity to light.  Endorses HAs CV: Denies CP, palpitations, orthopnea  Endorses SOB Pulm: Denies cough, wheezing   Endorses soreness with deep breathing, h/o ashtma GI: Denies abdominal pain, diarrhea, constipation  Endorses N/V GU: Denies dysuria, hematuria, frequency, vaginal discharge Msk: Denies muscle cramps, joint pains Neuro: Denies weakness, numbness, tingling Skin: Denies rashes, bruising Psych: Denies depression, anxiety, hallucinations      Objective:    Blood pressure 120/82, pulse 83, temperature 98.3 F (36.8 C), temperature source Oral, weight 268 lb 12.8 oz (121.9 kg), SpO2 99 %.  Physical Exam Gen. Pleasant, well-nourished, in no distress, normal affect, appears slightly nervous. HEENT -[place]/AT, no lesions, face symmetric, no scleral icterus, PERRLA, EOMI, no post nasal drip  Neck:  No JVD, no thyromegaly, no carotid bruits Lungs: no accessory muscle use, CTAB, no wheezes or rales.  Cardiovascular: RR, heart sounds  normal, no m/r/g, no peripheral edema Abdomen: soft and non-tender, no hepatosplenomegaly, BS normal. Musculoskeletal: No deformities, no cyanosis or clubbing, normal tone. No TTP of rib cage. Neuro:  A&Ox3, CN II-XII intact, normal gait, UE strength 5/5 b/l Skin:  Warm, no lesions/ rash   Wt Readings from Last 3 Encounters:  12/10/16 268 lb 12.8 oz (121.9 kg)  11/25/16 267 lb (121.1 kg)  11/03/16 272 lb (123.4 kg)    Diabetic Foot  Exam - Simple   No data filed     Lab Results  Component Value Date   WBC 9.3 11/25/2016   HGB 12.7 11/25/2016   HCT 36.4 11/25/2016   PLT 259 11/25/2016   GLUCOSE 112 (H) 11/25/2016   ALT 17 11/25/2016   AST 23 11/25/2016   NA 139 11/25/2016   K 3.6 11/25/2016   CL 108 11/25/2016   CREATININE 0.89 11/25/2016   BUN 18 11/25/2016   CO2 24 11/25/2016    Assessment/Plan: Migraine without aura and without status migrainosus, not intractable -  -Plan: promethazine (PHENERGAN) 25 MG tablet, ketorolac (TORADOL) injection 60 mg,  Unable to give sedating meds in clinic as pt did not have someone to drive her home. -Discussed keeping a HA diary.  Discussed need for preventive medications.  F/u in 3-4 wks to further discuss.  Sooner if symptoms become worse. -Discussed need to reduce stress, stay hydrated.  Given Handout on HAs -Ambulatory referral to Neurology   Myalgia - lower rib cage pain likely 2/2 emesis.  Recent EKG reviewed  Plan: ketorolac (TORADOL) injection 60 mg  Anxiety about health- discussed how HA symptoms may contribute to pt feeling SOB.  -Pulse ox 99% on RA.   -Pt reassured.

## 2016-12-10 NOTE — Patient Instructions (Addendum)
Recurrent Migraine Headache A migraine headache is very bad, throbbing pain that is usually on one side of your head. Recurrent migraines keep coming back (recurring). Talk with your doctor about what things may bring on (trigger) your migraine headaches. Follow these instructions at home: Medicines  Take over-the-counter and prescription medicines only as told by your doctor.  Do not drive or use heavy machinery while taking prescription pain medicine. Lifestyle  Do not use any products that contain nicotine or tobacco, such as cigarettes and e-cigarettes. If you need help quitting, ask your doctor.  Limit alcohol intake to no more than 1 drink a day for nonpregnant women and 2 drinks a day for men. One drink equals 12 oz of beer, 5 oz of wine, or 1 oz of hard liquor.  Get 7-9 hours of sleep each night.  Lessen any stress in your life. Ask your doctor about ways to lower your stress.  Stay at a healthy weight. Talk with your doctor if you need help losing weight.  Get regular exercise. General instructions  Keep a journal to find out if certain things bring on migraine headaches. For example, write down: ? What you eat and drink. ? How much sleep you get. ? Any change to your diet or medicines.  Lie down in a dark, quiet room when you have a migraine.  Try placing a cool towel over your head when you have a migraine.  Keep lights dim if bright lights bother you or make your migraines worse.  Keep all follow-up visits as told by your doctor. This is important. Contact a doctor if:  Medicine does not help your migraines.  Your pain keeps coming back.  You have a fever.  You have weight loss without trying. Get help right away if:  Your migraine becomes really bad and medicine does not help.  You have a stiff neck.  You have trouble seeing.  Your muscles are weak or you lose control of your muscles.  You lose your balance or have trouble walking.  You feel like  you will pass out (faint) or you pass out.  You have really bad symptoms that are different than your first symptoms.  You start having sudden, very bad headaches that last for one second or less, like a thunderclap. Summary  A migraine headache is very bad, throbbing pain that is usually on one side of your head.  Talk with your doctor about what things may bring on (trigger) your migraine headaches.  Take over-the-counter and prescription medicines only as told by your doctor.  Lie down in a dark, quiet room when you have a migraine.  Keep a journal about what you eat and drink, how much sleep you get, and any changes to your medicines. This can help you find out if certain things make you have migraine headaches. This information is not intended to replace advice given to you by your health care provider. Make sure you discuss any questions you have with your health care provider. Document Released: 01/22/2008 Document Revised: 03/07/2016 Document Reviewed: 03/07/2016 Elsevier Interactive Patient Education  2017 [employer]   Don't forget a prescription was sent to your pharmacy for a medication to help with nausea and vomiting.

## 2017-01-12 ENCOUNTER — Ambulatory Visit: Payer: Commercial Managed Care - PPO | Admitting: Family Medicine

## 2017-01-15 ENCOUNTER — Encounter: Payer: Self-pay | Admitting: Family Medicine

## 2017-02-12 ENCOUNTER — Encounter: Payer: Self-pay | Admitting: Emergency Medicine

## 2017-02-12 ENCOUNTER — Emergency Department: Payer: Medicaid Other

## 2017-02-12 ENCOUNTER — Emergency Department
Admission: EM | Admit: 2017-02-12 | Discharge: 2017-02-12 | Disposition: A | Discharge status: Home / Self Care | Payer: Medicaid Other | Attending: Emergency Medicine | Admitting: Emergency Medicine

## 2017-02-12 DIAGNOSIS — O99511 Diseases of the respiratory system complicating pregnancy, first trimester: Secondary | ICD-10-CM | POA: Insufficient documentation

## 2017-02-12 DIAGNOSIS — F17200 Nicotine dependence, unspecified, uncomplicated: Secondary | ICD-10-CM | POA: Diagnosis not present

## 2017-02-12 DIAGNOSIS — Z3A01 Less than 8 weeks gestation of pregnancy: Secondary | ICD-10-CM | POA: Diagnosis not present

## 2017-02-12 DIAGNOSIS — O208 Other hemorrhage in early pregnancy: Secondary | ICD-10-CM | POA: Insufficient documentation

## 2017-02-12 DIAGNOSIS — J45909 Unspecified asthma, uncomplicated: Secondary | ICD-10-CM | POA: Insufficient documentation

## 2017-02-12 DIAGNOSIS — R1030 Lower abdominal pain, unspecified: Secondary | ICD-10-CM | POA: Diagnosis not present

## 2017-02-12 DIAGNOSIS — O9989 Other specified diseases and conditions complicating pregnancy, childbirth and the puerperium: Secondary | ICD-10-CM | POA: Diagnosis present

## 2017-02-12 DIAGNOSIS — O209 Hemorrhage in early pregnancy, unspecified: Secondary | ICD-10-CM

## 2017-02-12 DIAGNOSIS — O99331 Smoking (tobacco) complicating pregnancy, first trimester: Secondary | ICD-10-CM | POA: Diagnosis not present

## 2017-02-12 DIAGNOSIS — Z79899 Other long term (current) drug therapy: Secondary | ICD-10-CM | POA: Insufficient documentation

## 2017-02-12 LAB — BASIC METABOLIC PANEL
ANION GAP: 5 (ref 5–15)
BUN: 14 mg/dL (ref 6–20)
CALCIUM: 8.8 mg/dL — AB (ref 8.9–10.3)
CO2: 24 mmol/L (ref 22–32)
Chloride: 109 mmol/L (ref 101–111)
Creatinine, Ser: 0.77 mg/dL (ref 0.44–1.00)
Glucose, Bld: 89 mg/dL (ref 65–99)
POTASSIUM: 3.6 mmol/L (ref 3.5–5.1)
Sodium: 138 mmol/L (ref 135–145)

## 2017-02-12 LAB — CBC
HCT: 35.9 % (ref 35.0–47.0)
HEMOGLOBIN: 11.9 g/dL — AB (ref 12.0–16.0)
MCH: 28.9 pg (ref 26.0–34.0)
MCHC: 33.3 g/dL (ref 32.0–36.0)
MCV: 86.9 fL (ref 80.0–100.0)
Platelets: 261 10*3/uL (ref 150–440)
RBC: 4.12 MIL/uL (ref 3.80–5.20)
RDW: 15.5 % — ABNORMAL HIGH (ref 11.5–14.5)
WBC: 10.3 10*3/uL (ref 3.6–11.0)

## 2017-02-12 LAB — URINALYSIS, COMPLETE (UACMP) WITH MICROSCOPIC
Bilirubin Urine: NEGATIVE
Glucose, UA: NEGATIVE mg/dL
Hgb urine dipstick: NEGATIVE
KETONES UR: NEGATIVE mg/dL
Leukocytes, UA: NEGATIVE
Nitrite: NEGATIVE
PH: 5 (ref 5.0–8.0)
Protein, ur: 30 mg/dL — AB
SPECIFIC GRAVITY, URINE: 1.031 — AB (ref 1.005–1.030)

## 2017-02-12 LAB — WET PREP, GENITAL
CLUE CELLS WET PREP: NONE SEEN
Sperm: NONE SEEN
Trich, Wet Prep: NONE SEEN
YEAST WET PREP: NONE SEEN

## 2017-02-12 LAB — HCG, QUANTITATIVE, PREGNANCY: hCG, Beta Chain, Quant, S: 6951 m[IU]/mL — ABNORMAL HIGH (ref <5)

## 2017-02-12 LAB — CHLAMYDIA/NGC RT PCR (ARMC ONLY)
CHLAMYDIA TR: NOT DETECTED
N GONORRHOEAE: NOT DETECTED

## 2017-02-12 LAB — ABO/RH: ABO/RH(D): A POS

## 2017-02-12 MED ORDER — ONDANSETRON 4 MG PO TBDP
4.0000 mg | ORAL_TABLET | Freq: Once | ORAL | Status: AC
Start: 2017-02-12 — End: 2017-02-12
  Administered 2017-02-12: 4 mg via ORAL
  Filled 2017-02-12: qty 1

## 2017-02-12 MED ORDER — ACETAMINOPHEN 500 MG PO TABS
1000.0000 mg | ORAL_TABLET | Freq: Once | ORAL | Status: AC
  Administered 2017-02-12: 1000 mg via ORAL
  Filled 2017-02-12 (×2): qty 2

## 2017-02-12 NOTE — ED Triage Notes (Signed)
Pt c/o lower abdominal pain and intermittent vaginal bleeding today.  Is about [redacted] weeks pregnant per pt report; has not seen OBGYN yet.  No NVD.  No fevers. Ambulatory without difficulty.

## 2017-02-12 NOTE — ED Provider Notes (Signed)
[facility] Emergency Department Provider Note  ____________________________________________  Time seen: Approximately 6:05 PM  I have reviewed the triage vital signs and the nursing notes.   HISTORY  Chief Complaint Abdominal Pain   HPI Shelby Obrien is a 26 y.o. female who presents for evaluation of lower abdominal pain and intermittent vaginal bleeding since today. Patient took a pregnancy test which is positive. For LMP she believes she is [redacted] weeks pregnant. This is her third pregnancy. She had one full vaginal delivery and a miscarriage. She reports small amount of blood and has not had to change her pad since this morning. No syncope, no CP, no SOB, no dysuria or hematuria. Patient reports sharp, moderate, constantlower abdominal pain non radiating.no nausea, vomiting, diarrhea, constipation.  Past Medical History:  Diagnosis Date  . Asthma   . Migraines     Patient Active Problem List   Diagnosis Date Noted  . Migraines 10/07/2016  . Asthma 10/07/2016    Past Surgical History:  Procedure Laterality Date  . WISDOM TOOTH EXTRACTION      Prior to Admission medications   Medication Sig Start Date End Date Taking? Authorizing Provider  albuterol (PROVENTIL HFA;VENTOLIN HFA) 108 (90 Base) MCG/ACT inhaler Inhale 2 puffs into the lungs every 4 (four) hours as needed for wheezing or shortness of breath. 10/07/16   Nelwyn Salisbury, MD  etonogestrel-ethinyl estradiol (NUVARING) 0.12-0.015 MG/24HR vaginal ring Insert vaginally and leave in place for 3 consecutive weeks, then remove for 1 week. 10/27/16   Tresea Mall, CNM  promethazine (PHENERGAN) 25 MG tablet Take 1 tablet (25 mg total) by mouth every 8 (eight) hours as needed for nausea or vomiting. 12/10/16   Deeann Saint, MD  SUMAtriptan (IMITREX) 100 MG tablet Take 1 tablet (100 mg total) by mouth as needed for migraine. May repeat in 2 hours if headache persists or recurs. 10/07/16   Nelwyn Salisbury, MD    Allergies Shellfish allergy  Family History  Problem Relation Age of Onset  . Cancer Other   . Diabetes Other     Social History Social History  Substance Use Topics  . Smoking status: Current Every Day Smoker    Packs/day: 0.50  . Smokeless tobacco: Never Used  . Alcohol use No    Review of Systems  Constitutional: Negative for fever. Eyes: Negative for visual changes. ENT: Negative for sore throat. Neck: No neck pain  Cardiovascular: Negative for chest pain. Respiratory: Negative for shortness of breath. Gastrointestinal: + lower abdominal pain. No vomiting or diarrhea. Genitourinary: Negative for dysuria. + vaginal bleeding Musculoskeletal: Negative for back pain. Skin: Negative for rash. Neurological: Negative for headaches, weakness or numbness. Psych: No SI or HI  ____________________________________________   PHYSICAL EXAM:  VITAL SIGNS: ED Triage Vitals  Enc Vitals Group     BP 02/12/17 1622 134/72     Pulse Rate 02/12/17 1621 89     Resp 02/12/17 1621 18     Temp 02/12/17 1621 99.1 F (37.3 C)     Temp Source 02/12/17 1621 Oral     SpO2 02/12/17 1621 96 %     Weight 02/12/17 1621 265 lb (120.2 kg)     Height 02/12/17 1621 5\' 5"  (1.651 m)     Head Circumference --      Peak Flow --      Pain Score 02/12/17 1620 8     Pain Loc --      Pain Edu? --  Excl. in GC? --     Constitutional: Alert and oriented. Well appearing and in no apparent distress. HEENT:      Head: Normocephalic and atraumatic.         Eyes: Conjunctivae are normal. Sclera is non-icteric.       Mouth/Throat: Mucous membranes are moist.       Neck: Supple with no signs of meningismus. Cardiovascular: Regular rate and rhythm. No murmurs, gallops, or rubs. 2+ symmetrical distal pulses are present in all extremities. No JVD. Respiratory: Normal respiratory effort. Lungs are clear to auscultation bilaterally. No wheezes, crackles, or rhonchi.    Gastrointestinal: Soft, diffusely ttp over the lower abdomen, and non distended with positive bowel sounds. No rebound or guarding. Pelvic exam: Normal external genitalia, no rashes or lesions. Normal cervical mucus. Os closed. No cervical motion tenderness.  B/l adnexa and uterine tenderness.   Musculoskeletal: Nontender with normal range of motion in all extremities. No edema, cyanosis, or erythema of extremities. Neurologic: Normal speech and language. Face is symmetric. Moving all extremities. No gross focal neurologic deficits are appreciated. Skin: Skin is warm, dry and intact. No rash noted. Psychiatric: Mood and affect are normal. Speech and behavior are normal.  ____________________________________________   LABS (all labs ordered are listed, but only abnormal results are displayed)  Labs Reviewed  WET PREP, GENITAL - Abnormal; Notable for the following:       Result Value   WBC, Wet Prep HPF POC MODERATE (*)    All other components within normal limits  HCG, QUANTITATIVE, PREGNANCY - Abnormal; Notable for the following:    hCG, Beta Chain, Quant, S 6,951 (*)    All other components within normal limits  URINALYSIS, COMPLETE (UACMP) WITH MICROSCOPIC - Abnormal; Notable for the following:    Color, Urine YELLOW (*)    APPearance HAZY (*)    Specific Gravity, Urine 1.031 (*)    Protein, ur 30 (*)    Bacteria, UA RARE (*)    Squamous Epithelial / LPF 0-5 (*)    All other components within normal limits  BASIC METABOLIC PANEL - Abnormal; Notable for the following:    Calcium 8.8 (*)    All other components within normal limits  CBC - Abnormal; Notable for the following:    Hemoglobin 11.9 (*)    RDW 15.5 (*)    All other components within normal limits  CHLAMYDIA/NGC RT PCR ([facility] ONLY)  URINE CULTURE  POC URINE PREG, ED  ABO/RH   ____________________________________________  EKG  none  ____________________________________________  RADIOLOGY  TVUS:  Probable early intrauterine gestational sac, but no yolk sac, embryo, or cardiac activity yet visualized. Recommend follow-up quantitative B-HCG levels and follow-up US in 14 days to assess viability.  ____________________________________________   PROCEDURES  Procedure(s) performed: None Procedures Critical Care performed:  None ____________________________________________   INITIAL IMPRESSION / ASSESSMENT AND PLAN / ED COURSE  26 y.o. female G3P1A1 at estimated [redacted] weeks GA who presents for evaluation of lower abdominal pain and intermittent vaginal bleeding since today. patient is well-appearing, in no distress, normal vital signs, abdomen with diffuse lower abdominal tenderness, no rebound or guarding, pelvic exam showing no CMT but bilateral adnexa and uterine tenderness on exam with a small amount of blood in the vaginal vault and a close os. Differential diagnosis includes bleeding of first trimester versus miscarriage versus STD. Wet prep and chlamydia are pending. Transvaginal ultrasound is pending.patient is hemodynamically stable. Stable hemoglobin at 11.9.blood type A positive with no  indication for Rhogam    _________________________ 8:03 PM on 02/12/2017 -----------------------------------------  ultrasound showing early intrauterine gestational sac. Recommendation for follow-up beta quantitative and ultrasound in 2 weeks. Discussed the recommendation with the patient. Discussed signs and symptoms of acute blood loss anemia recommend return if those develop. Recommend close follow-up with OB/GYN and pelvic rest.   As part of my medical decision making, I reviewed the following data within the electronic MEDICAL RECORD NUMBER Nursing notes reviewed and incorporated, Radiograph reviewed , Notes from prior ED visits and [place] Controlled Substance Database    Pertinent labs & imaging results that were available during my care of the patient were reviewed by me and considered in my  medical decision making (see chart for details).    ____________________________________________   FINAL CLINICAL IMPRESSION(S) / ED DIAGNOSES  Final diagnoses:  Lower abdominal pain  Vaginal bleeding affecting early pregnancy      NEW MEDICATIONS STARTED DURING THIS VISIT:  New Prescriptions   No medications on file     Note:  This document was prepared using Dragon voice recognition software and may include unintentional dictation errors.    Nita Sickle, MD 02/12/17 2005

## 2017-02-12 NOTE — ED Notes (Signed)
First Nurse Note:  Patient is complaining of vaginal bleeding and states she is [redacted] weeks pregnant.  Patient is not complaining of abdominal pain at this time.  Patient ambulatory to registration desk without obvious distress.

## 2017-02-14 LAB — URINE CULTURE

## 2017-02-16 ENCOUNTER — Encounter: Payer: Self-pay | Admitting: Obstetrics and Gynecology

## 2017-02-16 ENCOUNTER — Ambulatory Visit (INDEPENDENT_AMBULATORY_CARE_PROVIDER_SITE_OTHER): Payer: BLUE CROSS/BLUE SHIELD | Admitting: Obstetrics and Gynecology

## 2017-02-16 VITALS — BP 128/74 | Wt 264.0 lb

## 2017-02-16 DIAGNOSIS — O209 Hemorrhage in early pregnancy, unspecified: Secondary | ICD-10-CM | POA: Diagnosis not present

## 2017-02-16 DIAGNOSIS — O200 Threatened abortion: Secondary | ICD-10-CM | POA: Diagnosis not present

## 2017-02-16 NOTE — Progress Notes (Signed)
ER Follow up

## 2017-02-16 NOTE — Progress Notes (Signed)
Obstetrics & Gynecology Office Visit    Chief Complaint  Patient presents with  . ER Follow Up  Early pregnancy vaginal bleeding  History of Present Illness: 26 y.o. 573P1011 female who presents in follow-up from an ER visit 4 days ago.  At that visit she presented for mild vaginal bleeding and mild lower abdominal pain.  She took a pregnancy test at home that was positive.  Based on her LMP, she believes she is about [redacted] weeks pregnant.  Her quantitative beta-hCG during that visit was 6,951.  She also had a pelvic ultrasound that showed a gestational sac without a yolk sac or an embryo.  She presents today in follow-up.  She continues to have vaginal spotting with no abdominal pain.  She denies nausea vomiting, shortness of breath, feeling weak or lightheaded.    Past Medical History:  Diagnosis Date  . Asthma   . Migraines     Past Surgical History:  Procedure Laterality Date  . WISDOM TOOTH EXTRACTION      Gynecologic History: Patient's last menstrual period was 01/07/2017.  Obstetric History: G3P1011  Family History  Problem Relation Age of Onset  . Cancer Other   . Diabetes Other     Social History   Social History  . Marital status: Single    Spouse name: N/A  . Number of children: N/A  . Years of education: N/A   Occupational History  . Not on file.   Social History Main Topics  . Smoking status: Current Every Day Smoker    Packs/day: 0.50  . Smokeless tobacco: Never Used  . Alcohol use No  . Drug use: No  . Sexual activity: Yes    Birth control/ protection: None   Other Topics Concern  . Not on file   Social History Narrative  . No narrative on file    Allergies  Allergen Reactions  . Shellfish Allergy Anaphylaxis    Prior to Admission medications   Medication Sig Start Date End Date Taking? Authorizing Provider  albuterol (PROVENTIL HFA;VENTOLIN HFA) 108 (90 Base) MCG/ACT inhaler Inhale 2 puffs into the lungs every 4 (four) hours as needed  for wheezing or shortness of breath. 10/07/16  Yes Nelwyn SalisburyFry, Eila Runyan A, MD    Review of Systems  Constitutional: Negative.   HENT: Negative.   Eyes: Negative.   Respiratory: Negative.   Cardiovascular: Negative.   Gastrointestinal: Negative.   Genitourinary: Negative for dysuria, flank pain, frequency, hematuria and urgency.       Mild vaginal bleeding  Musculoskeletal: Negative.   Skin: Negative.   Neurological: Negative.   Psychiatric/Behavioral: Negative.      Physical Exam BP 128/74   Wt 264 lb (119.7 kg)   LMP 01/07/2017   BMI 43.93 kg/m   Patient's last menstrual period was 01/07/2017. Physical Exam  Constitutional: She is oriented to person, place, and time. She appears well-developed and well-nourished. No distress.  HENT:  Head: Normocephalic and atraumatic.  Eyes: Conjunctivae are normal. No scleral icterus.  Neck: Normal range of motion. Neck supple. No thyromegaly present.  Cardiovascular: Normal rate and regular rhythm.   Pulmonary/Chest: Effort normal and breath sounds normal. No respiratory distress. She has no wheezes.  Abdominal: Soft. Bowel sounds are normal. She exhibits no distension and no mass. There is no tenderness. There is no guarding.  Musculoskeletal: Normal range of motion. She exhibits no edema.  Lymphadenopathy:    She has no cervical adenopathy.  Neurological: She is alert and oriented to person,  place, and time. No cranial nerve deficit.  Skin: Skin is warm. No erythema.  Psychiatric: She has a normal mood and affect. Her behavior is normal. Judgment normal.    Female chaperone present for pelvic and breast  portions of the physical exam  Assessment: 26 y.o. G24P1011 female here for  1. First trimester bleeding   2. Threatened abortion      Plan: Problem List Items Addressed This Visit    First trimester bleeding - Primary   Relevant Orders   Beta HCG, Quant (Completed)   US OB Comp Less 14 Wks    Other Visit Diagnoses    Threatened  abortion        Will order pelvic ultrasound to be accomplished 2 weeks from the time of her first ultrasound her [employer] practice bulletin 200.  We will also order a quantitative beta hCG to ensure that marker is increasing correctly.  Discussed that she has risk of a miscarriage, of which she voiced understanding.  She was given precautions regarding heavy vaginal bleeding in case the miscarriage does occur.  We will continue to follow her beta levels, if they continue to increase and she continues to have bleeding.  Thomasene Mohair, MD 02/17/2017 10:51 AM

## 2017-02-17 LAB — BETA HCG QUANT (REF LAB): HCG QUANT: 11062 m[IU]/mL

## 2017-02-26 ENCOUNTER — Ambulatory Visit (INDEPENDENT_AMBULATORY_CARE_PROVIDER_SITE_OTHER): Payer: BLUE CROSS/BLUE SHIELD

## 2017-02-26 ENCOUNTER — Ambulatory Visit (INDEPENDENT_AMBULATORY_CARE_PROVIDER_SITE_OTHER): Payer: BLUE CROSS/BLUE SHIELD | Admitting: Maternal Newborn

## 2017-02-26 VITALS — BP 110/80 | Wt 260.0 lb

## 2017-02-26 DIAGNOSIS — O0991 Supervision of high risk pregnancy, unspecified, first trimester: Secondary | ICD-10-CM | POA: Diagnosis not present

## 2017-02-26 DIAGNOSIS — O209 Hemorrhage in early pregnancy, unspecified: Secondary | ICD-10-CM

## 2017-02-26 DIAGNOSIS — O99210 Obesity complicating pregnancy, unspecified trimester: Secondary | ICD-10-CM

## 2017-02-26 DIAGNOSIS — Z6841 Body Mass Index (BMI) 40.0 and over, adult: Secondary | ICD-10-CM | POA: Insufficient documentation

## 2017-02-26 DIAGNOSIS — Z3A01 Less than 8 weeks gestation of pregnancy: Secondary | ICD-10-CM | POA: Diagnosis not present

## 2017-02-26 HISTORY — DX: Obesity complicating pregnancy, unspecified trimester: O99.210

## 2017-02-26 HISTORY — DX: Morbid (severe) obesity due to excess calories: E66.01

## 2017-02-26 HISTORY — DX: Supervision of high risk pregnancy, unspecified, first trimester: O09.91

## 2017-02-26 NOTE — Progress Notes (Signed)
    Prenatal Care Visit  Subjective  Levora DredgeJasmine Monique Coello is a 26 y.o. G3P1011 at 5921w1d being seen today forprenatal care.  She is currently monitored for the following issues for this high-risk pregnancy and has Migraines; Asthma; First trimester bleeding; Supervision of high risk pregnancy, antepartum, first trimester; Maternal obesity affecting pregnancy, antepartum; and BMI 40.0-44.9, adult (HCC) on her problem list.  ----------------------------------------------------------------------------------- Patient reports no complaints.   Vag. Bleeding: None. Denies leaking of fluid.  ----------------------------------------------------------------------------------- The following portions of the patient's history were reviewed and updated as appropriate: allergies, current medications, past family history, past medical history, past social history, past surgical history and problem list. Problem list updated.   Objective  Blood pressure 110/80, weight 260 lb (117.9 kg), last menstrual period 01/07/2017. Pregravid weight 268 lb (121.6 kg) Total Weight Gain -8 lb (-3.629 kg) Urinalysis: Urine Protein: Negative Urine Glucose: Negative  Fetal Status: Fetal Heart Rate (bpm): 134         General:  Alert, oriented and cooperative. Patient is in no acute distress.  Skin: Skin is warm and dry. No rash noted.   Cardiovascular: Normal heart rate noted  Respiratory: Normal respiratory effort, no problems with respiration noted  Abdomen: Soft, gravid, appropriate for gestational age. Pain/Pressure: Absent     Pelvic:  Cervical exam deferred        Extremities: Normal range of motion.     Mental Status: Normal mood and affect. Normal behavior. Normal judgment and thought content.     Assessment   26 y.o. G3P1011 at 5021w1d; EDD 10/14/2017, by Last Menstrual Period presenting for assessment of viability.  Plan   pregnancy Problems (from 02/16/17 to present)    Problem Noted Resolved   First  trimester bleeding 02/16/2017 by Conard NovakJackson, Stephen D, MD No    Ultrasound today shows viable single IUP, size=dates, FHR 134. Patient needs NOB visit and labs, early GTT next visit.   Preterm labor symptoms and general obstetric precautions including but not limited to vaginal bleeding, contractions, leaking of fluid and fetal movement were reviewed in detail with the patient.  Return in about 1 week (around 03/05/2017) for NOB.  Marcelyn BruinsJacelyn Lokelani Lutes, CNM 02/26/2017  10:45 AM

## 2017-03-05 ENCOUNTER — Ambulatory Visit (INDEPENDENT_AMBULATORY_CARE_PROVIDER_SITE_OTHER): Payer: BLUE CROSS/BLUE SHIELD | Admitting: Maternal Newborn

## 2017-03-05 ENCOUNTER — Encounter: Payer: Self-pay | Admitting: Maternal Newborn

## 2017-03-05 VITALS — BP 110/80 | Wt 254.0 lb

## 2017-03-05 DIAGNOSIS — O219 Vomiting of pregnancy, unspecified: Secondary | ICD-10-CM | POA: Insufficient documentation

## 2017-03-05 DIAGNOSIS — Z113 Encounter for screening for infections with a predominantly sexual mode of transmission: Secondary | ICD-10-CM

## 2017-03-05 DIAGNOSIS — O99210 Obesity complicating pregnancy, unspecified trimester: Secondary | ICD-10-CM

## 2017-03-05 DIAGNOSIS — O0991 Supervision of high risk pregnancy, unspecified, first trimester: Secondary | ICD-10-CM | POA: Diagnosis not present

## 2017-03-05 DIAGNOSIS — Z6841 Body Mass Index (BMI) 40.0 and over, adult: Secondary | ICD-10-CM

## 2017-03-05 MED ORDER — DOXYLAMINE-PYRIDOXINE 10-10 MG PO TBEC: 2.0000 | DELAYED_RELEASE_TABLET | Freq: Every day | ORAL | 5 refills | Status: DC

## 2017-03-05 NOTE — Patient Instructions (Signed)
First Trimester of Pregnancy The first trimester of pregnancy is from week 1 until the end of week 13 (months 1 through 3). A week after a sperm fertilizes an egg, the egg will implant on the wall of the uterus. This embryo will begin to develop into a baby. Genes from you and your partner will form the baby. The female genes will determine whether the baby will be a boy or a girl. At 6-8 weeks, the eyes and face will be formed, and the heartbeat can be seen on ultrasound. At the end of 12 weeks, all the baby's organs will be formed. Now that you are pregnant, you will want to do everything you can to have a healthy baby. Two of the most important things are to get good prenatal care and to follow your health care provider's instructions. Prenatal care is all the medical care you receive before the baby's birth. This care will help prevent, find, and treat any problems during the pregnancy and childbirth. Body changes during your first trimester Your body goes through many changes during pregnancy. The changes vary from woman to woman.  You may gain or lose a couple of pounds at first.  You may feel sick to your stomach (nauseous) and you may throw up (vomit). If the vomiting is uncontrollable, call your health care provider.  You may tire easily.  You may develop headaches that can be relieved by medicines. All medicines should be approved by your health care provider.  You may urinate more often. Painful urination may mean you have a bladder infection.  You may develop heartburn as a result of your pregnancy.  You may develop constipation because certain hormones are causing the muscles that push stool through your intestines to slow down.  You may develop hemorrhoids or swollen veins (varicose veins).  Your breasts may begin to grow larger and become tender. Your nipples may stick out more, and the tissue that surrounds them (areola) may become darker.  Your gums may bleed and may be  sensitive to brushing and flossing.  Dark spots or blotches (chloasma, mask of pregnancy) may develop on your face. This will likely fade after the baby is born.  Your menstrual periods will stop.  You may have a loss of appetite.  You may develop cravings for certain kinds of food.  You may have changes in your emotions from day to day, such as being excited to be pregnant or being concerned that something may go wrong with the pregnancy and baby.  You may have more vivid and strange dreams.  You may have changes in your hair. These can include thickening of your hair, rapid growth, and changes in texture. Some women also have hair loss during or after pregnancy, or hair that feels dry or thin. Your hair will most likely return to normal after your baby is born.  What to expect at prenatal visits During a routine prenatal visit:  You will be weighed to make sure you and the baby are growing normally.  Your blood pressure will be taken.  Your abdomen will be measured to track your baby's growth.  The fetal heartbeat will be listened to between weeks 10 and 14 of your pregnancy.  Test results from any previous visits will be discussed.  Your health care provider may ask you:  How you are feeling.  If you are feeling the baby move.  If you have had any abnormal symptoms, such as leaking fluid, bleeding, severe headaches,   or abdominal cramping.  If you are using any tobacco products, including cigarettes, chewing tobacco, and electronic cigarettes.  If you have any questions.  Other tests that may be performed during your first trimester include:  Blood tests to find your blood type and to check for the presence of any previous infections. The tests will also be used to check for low iron levels (anemia) and protein on red blood cells (Rh antibodies). Depending on your risk factors, or if you previously had diabetes during pregnancy, you may have tests to check for high blood  sugar that affects pregnant women (gestational diabetes).  Urine tests to check for infections, diabetes, or protein in the urine.  An ultrasound to confirm the proper growth and development of the baby.  Fetal screens for spinal cord problems (spina bifida) and Down syndrome.  HIV (human immunodeficiency virus) testing. Routine prenatal testing includes screening for HIV, unless you choose not to have this test.  You may need other tests to make sure you and the baby are doing well.  Follow these instructions at home: Medicines  Follow your health care provider's instructions regarding medicine use. Specific medicines may be either safe or unsafe to take during pregnancy.  Take a prenatal vitamin that contains at least 600 micrograms (mcg) of folic acid.  If you develop constipation, try taking a stool softener if your health care provider approves. Eating and drinking  Eat a balanced diet that includes fresh fruits and vegetables, whole grains, good sources of protein such as meat, eggs, or tofu, and low-fat dairy. Your health care provider will help you determine the amount of weight gain that is right for you.  Avoid raw meat and uncooked cheese. These carry germs that can cause birth defects in the baby.  Eating four or five small meals rather than three large meals a day may help relieve nausea and vomiting. If you start to feel nauseous, eating a few soda crackers can be helpful. Drinking liquids between meals, instead of during meals, also seems to help ease nausea and vomiting.  Limit foods that are high in fat and processed sugars, such as fried and sweet foods.  To prevent constipation: ? Eat foods that are high in fiber, such as fresh fruits and vegetables, whole grains, and beans. ? Drink enough fluid to keep your urine clear or pale yellow. Activity  Exercise only as directed by your health care provider. Most women can continue their usual exercise routine during  pregnancy. Try to exercise for 30 minutes at least 5 days a week. Exercising will help you: ? Control your weight. ? Stay in shape. ? Be prepared for labor and delivery.  Experiencing pain or cramping in the lower abdomen or lower back is a good sign that you should stop exercising. Check with your health care provider before continuing with normal exercises.  Try to avoid standing for long periods of time. Move your legs often if you must stand in one place for a long time.  Avoid heavy lifting.  Wear low-heeled shoes and practice good posture.  You may continue to have sex unless your health care provider tells you not to. Relieving pain and discomfort  Wear a good support bra to relieve breast tenderness.  Take warm sitz baths to soothe any pain or discomfort caused by hemorrhoids. Use hemorrhoid cream if your health care provider approves.  Rest with your legs elevated if you have leg cramps or low back pain.  If you develop   varicose veins in your legs, wear support hose. Elevate your feet for 15 minutes, 3-4 times a day. Limit salt in your diet. Prenatal care  Schedule your prenatal visits by the twelfth week of pregnancy. They are usually scheduled monthly at first, then more often in the last 2 months before delivery.  Write down your questions. Take them to your prenatal visits.  Keep all your prenatal visits as told by your health care provider. This is important. Safety  Wear your seat belt at all times when driving.  Make a list of emergency phone numbers, including numbers for family, friends, the hospital, and police and fire departments. General instructions  Ask your health care provider for a referral to a local prenatal education class. Begin classes no later than the beginning of month 6 of your pregnancy.  Ask for help if you have counseling or nutritional needs during pregnancy. Your health care provider can offer advice or refer you to specialists for help  with various needs.  Do not use hot tubs, steam rooms, or saunas.  Do not douche or use tampons or scented sanitary pads.  Do not cross your legs for long periods of time.  Avoid cat litter boxes and soil used by cats. These carry germs that can cause birth defects in the baby and possibly loss of the fetus by miscarriage or stillbirth.  Avoid all smoking, herbs, alcohol, and medicines not prescribed by your health care provider. Chemicals in these products affect the formation and growth of the baby.  Do not use any products that contain nicotine or tobacco, such as cigarettes and e-cigarettes. If you need help quitting, ask your health care provider. You may receive counseling support and other resources to help you quit.  Schedule a dentist appointment. At home, brush your teeth with a soft toothbrush and be gentle when you floss. Contact a health care provider if:  You have dizziness.  You have mild pelvic cramps, pelvic pressure, or nagging pain in the abdominal area.  You have persistent nausea, vomiting, or diarrhea.  You have a bad smelling vaginal discharge.  You have pain when you urinate.  You notice increased swelling in your face, hands, legs, or ankles.  You are exposed to fifth disease or chickenpox.  You are exposed to German measles (rubella) and have never had it. Get help right away if:  You have a fever.  You are leaking fluid from your vagina.  You have spotting or bleeding from your vagina.  You have severe abdominal cramping or pain.  You have rapid weight gain or loss.  You vomit blood or material that looks like coffee grounds.  You develop a severe headache.  You have shortness of breath.  You have any kind of trauma, such as from a fall or a car accident. Summary  The first trimester of pregnancy is from week 1 until the end of week 13 (months 1 through 3).  Your body goes through many changes during pregnancy. The changes vary from  woman to woman.  You will have routine prenatal visits. During those visits, your health care provider will examine you, discuss any test results you may have, and talk with you about how you are feeling. This information is not intended to replace advice given to you by your health care provider. Make sure you discuss any questions you have with your health care provider. Document Released: 04/08/2001 Document Revised: 03/26/2016 Document Reviewed: 03/26/2016 Elsevier Interactive Patient Education  2017 Elsevier   Inc.  

## 2017-03-05 NOTE — Progress Notes (Signed)
03/05/2017   Chief Complaint: Desires prenatal care.  Transfer of Care Patient: no  History of Present Illness: Ms. Woodroe ChenShivers is a 26 y.o. G3P1011 at 3224w1d based on Patient's last menstrual period on 01/07/2017, with an Estimated Date of Delivery: 10/14/17, with the above CC.   Her periods were: regular periods every 28 days She was using no method when she conceived.  She has Positive signs or symptoms of nausea/vomiting of pregnancy. She has Negative signs or symptoms of miscarriage or preterm labor She identifies Negative Zika risk factors for her and her partner On any different medications around the time she conceived/early pregnancy: No  History of varicella: No   ROS: A 12-point review of systems was performed and negative, except as stated in the above HPI.  OBGYN History: As per HPI. OB History  Gravida Para Term Preterm AB Living  3 1 1   1 1   SAB TAB Ectopic Multiple Live Births  1       1    # Outcome Date GA Lbr Len/2nd Weight Sex Delivery Anes PTL Lv  3 Current           2 Term           1 SAB               Any issues with any prior pregnancies: yes Any prior children are healthy, doing well, without any problems or issues: yes History of pap smears: Yes. Last pap smear 10/27/2016. Result: NILM History of STIs: No   Past Medical History: Past Medical History:  Diagnosis Date  . Asthma   . Migraines     Past Surgical History: Past Surgical History:  Procedure Laterality Date  . WISDOM TOOTH EXTRACTION      Family History:  Family History  Problem Relation Age of Onset  . Cancer Other   . Diabetes Other    She denies any female cancers, bleeding or blood clotting disorders.  She denies any history of intellectual disability, birth defects or genetic disorders in her or the FOB's history  Social History:  Social History   Socioeconomic History  . Marital status: Single    Spouse name: Not on file  . Number of children: Not on file  . Years of  education: Not on file  . Highest education level: Not on file  Social Needs  . Financial resource strain: Not on file  . Food insecurity - worry: Not on file  . Food insecurity - inability: Not on file  . Transportation needs - medical: Not on file  . Transportation needs - non-medical: Not on file  Occupational History  . Not on file  Tobacco Use  . Smoking status: Current Every Day Smoker    Packs/day: 0.50  . Smokeless tobacco: Never Used  Substance and Sexual Activity  . Alcohol use: No  . Drug use: No  . Sexual activity: Yes    Birth control/protection: None  Other Topics Concern  . Not on file  Social History Narrative  . Not on file   Any cats in the household: no Denies history of and current domestic violence  Allergy: Allergies  Allergen Reactions  . Shellfish Allergy Anaphylaxis  . Other     BLUEBERRIES, BANANAS, TREE NUTS, RASPBERRIES, GRAPES, POLLENS/SEASONAL.    Current Outpatient Medications:  Current Outpatient Medications:  .  albuterol (PROVENTIL HFA;VENTOLIN HFA) 108 (90 Base) MCG/ACT inhaler, Inhale 2 puffs into the lungs every 4 (four) hours as needed  for wheezing or shortness of breath., Disp: 1 Inhaler, Rfl: 5   Physical Exam:   BP 110/80   Wt 254 lb (115.2 kg)   LMP 01/07/2017   BMI 42.27 kg/m  Body mass index is 42.27 kg/m. Constitutional: Well nourished, well developed female in no acute distress Neck:  Supple, normal appearance, and no thyromegaly  Cardiovascular: S1, S2 normal, no murmur, rub or gallop, regular rate and rhythm Respiratory:  Clear to auscultation bilateral. Normal respiratory effort Abdomen: positive bowel sounds and no masses, hernias; diffusely non tender to palpation, non distended Breasts:  patient declines to have breast exam Neuro/Psych:  Normal mood and affect Skin:  Warm and dry Lymphatic:  No inguinal lymphadenopathy.   Pelvic exam: is not limited by body habitus External genitalia, Bartholin's glands,  Urethra, Skene's glands: within normal limits Vagina: within normal limits and with no blood in the vault  Cervix: normal appearing cervix without discharge or lesions, closed/long/high Uterus:  enlarged, c/w pregnancy Adnexa:  normal adnexa and no mass, fullness, tenderness  Assessment: Ms. Woodroe ChenShivers is a 26 y.o. G3P1011 3240w1d based on patient's last menstrual period on 01/07/2017, with an Estimated Date of Delivery: 10/14/17, presenting for prenatal care.  Plan:  1) Avoid alcoholic beverages. 2) Patient encouraged not to smoke. She has cut down from 0/5 packs/day to one cigarette daily. Will try to achieve cessation. 3) Discontinue the use of all non-medicinal drugs and chemicals.  4) Take prenatal vitamins daily.  5) Seatbelt use advised. 6) Nutrition, food safety (fish, cheese advisories, and high nitrite foods) and exercise discussed. 7) Hospital and practice style and delivering [facility] discussed. 8) Patient is asked about travel to areas at risk for the Zika virus, and counseled to avoid travel and exposure to mosquitoes or sexual partners who may have themselves been exposed to the virus.  9) [facility]birth classes at [facility] advised. 10) Genetic Screening, such as with 1st Trimester Screening, cell free fetal DNA, AFP testing, and Ultrasound, as well as with amniocentesis and CVS as appropriate, is discussed with patient. She plans to decline genetic testing this pregnancy.  Problem list reviewed and updated. Diclegis Rx for nausea and vomiting, samples of Bonjesta given.  Marcelyn BruinsJacelyn Schmid, CNM [facility], [facility] 03/05/2017  1:53 PM

## 2017-03-05 NOTE — Progress Notes (Signed)
No [email or web address] 

## 2017-03-06 LAB — URINE DRUG PANEL 7
AMPHETAMINES, URINE: NEGATIVE ng/mL
BARBITURATE QUANT UR: NEGATIVE ng/mL
BENZODIAZEPINE QUANT UR: NEGATIVE ng/mL
CANNABINOID QUANT UR: NEGATIVE ng/mL
COCAINE (METAB.): NEGATIVE ng/mL
OPIATE QUANT UR: NEGATIVE ng/mL
PCP Quant, Ur: NEGATIVE ng/mL

## 2017-03-06 LAB — HIV ANTIBODY (ROUTINE TESTING W REFLEX): HIV Screen 4th Generation wRfx: NONREACTIVE

## 2017-03-06 LAB — HEPATITIS B SURFACE ANTIGEN: Hepatitis B Surface Ag: NEGATIVE

## 2017-03-06 LAB — RPR: RPR Ser Ql: NONREACTIVE

## 2017-03-06 LAB — VARICELLA ZOSTER ANTIBODY, IGG: Varicella zoster IgG: 1516 index (ref >165)

## 2017-03-06 LAB — GLUCOSE, 1 HOUR GESTATIONAL: Gestational Diabetes Screen: 86 mg/dL (ref 65–139)

## 2017-03-06 LAB — RUBELLA SCREEN: Rubella Antibodies, IGG: 4.81 index (ref >0.99)

## 2017-03-07 LAB — URINE CULTURE

## 2017-03-09 LAB — HEMOGLOBINOPATHY EVALUATION
HGB C: 0 %
HGB S: 0 %
HGB VARIANT: 0 %
Hemoglobin A2 Quantitation: 2.4 % (ref 1.8–3.2)
Hemoglobin F Quantitation: 0 % (ref 0.0–2.0)
Hgb A: 97.6 % (ref 96.4–98.8)

## 2017-03-12 ENCOUNTER — Encounter: Payer: Self-pay | Admitting: Maternal Newborn

## 2017-03-12 ENCOUNTER — Other Ambulatory Visit: Payer: Self-pay | Admitting: Maternal Newborn

## 2017-03-12 DIAGNOSIS — B379 Candidiasis, unspecified: Secondary | ICD-10-CM

## 2017-03-12 LAB — NUSWAB VAGINITIS PLUS (VG+)
CANDIDA GLABRATA, NAA: NEGATIVE
Candida albicans, NAA: POSITIVE — AB
Chlamydia trachomatis, NAA: NEGATIVE
NEISSERIA GONORRHOEAE, NAA: NEGATIVE
Trich vag by NAA: NEGATIVE

## 2017-03-12 MED ORDER — TERCONAZOLE 0.4 % VA CREA: 1.0000 | TOPICAL_CREAM | Freq: Every day | VAGINAL | 0 refills | Status: AC

## 2017-03-30 ENCOUNTER — Ambulatory Visit: Payer: Commercial Managed Care - PPO | Admitting: Neurology

## 2017-04-02 ENCOUNTER — Ambulatory Visit (INDEPENDENT_AMBULATORY_CARE_PROVIDER_SITE_OTHER): Payer: BLUE CROSS/BLUE SHIELD | Admitting: Obstetrics and Gynecology

## 2017-04-02 ENCOUNTER — Encounter: Payer: Self-pay | Admitting: Obstetrics and Gynecology

## 2017-04-02 VITALS — BP 104/72 | Wt 241.0 lb

## 2017-04-02 DIAGNOSIS — Z1371 Encounter for nonprocreative screening for genetic disease carrier status: Secondary | ICD-10-CM

## 2017-04-02 DIAGNOSIS — O210 Mild hyperemesis gravidarum: Secondary | ICD-10-CM

## 2017-04-02 DIAGNOSIS — O99210 Obesity complicating pregnancy, unspecified trimester: Secondary | ICD-10-CM

## 2017-04-02 DIAGNOSIS — O0991 Supervision of high risk pregnancy, unspecified, first trimester: Secondary | ICD-10-CM

## 2017-04-02 DIAGNOSIS — Z1379 Encounter for other screening for genetic and chromosomal anomalies: Secondary | ICD-10-CM

## 2017-04-02 MED ORDER — ASPIRIN EC 81 MG PO TBEC: 81.0000 mg | DELAYED_RELEASE_TABLET | Freq: Every day | ORAL | 2 refills | Status: DC

## 2017-04-02 MED ORDER — PROCHLORPERAZINE MALEATE 10 MG PO TABS: 10.0000 mg | ORAL_TABLET | Freq: Four times a day (QID) | ORAL | 3 refills | Status: DC | PRN

## 2017-04-02 NOTE — Progress Notes (Signed)
04/02/2017   Chief Complaint: Missed period  Transfer of Care Patient: no  History of Present Illness: Ms. Woodroe ChenShivers is a 26 y.o. G3P1011 4741w1d based on Patient's last menstrual period was 01/07/2017. with an Estimated Date of Delivery: 10/14/17, with the above CC.   Her periods were: regular periods every 28 days She was using no method when she conceived.  She has Positive signs or symptoms of nausea/vomiting of pregnancy. She has Negative signs or symptoms of miscarriage or preterm labor She identifies Negative Zika risk factors for her and her partner On any different medications around the time she conceived/early pregnancy: No  History of varicella: No   ROS: A 12-point review of systems was performed and negative, except as stated in the above HPI.  OBGYN History: As per HPI. OB History  Gravida Para Term Preterm AB Living  3 1 1   1 1   SAB TAB Ectopic Multiple Live Births  1       1    # Outcome Date GA Lbr Len/2nd Weight Sex Delivery Anes PTL Lv  3 Current           2 Term           1 SAB               Any issues with any prior pregnancies: no Any prior children are healthy, doing well, without any problems or issues: yes History of pap smears: Yes. Last pap smear 11/06/2016. Abnormal: no NIL History of STIs: No   Past Medical History: Past Medical History:  Diagnosis Date  . Asthma   . Migraines     Past Surgical History: Past Surgical History:  Procedure Laterality Date  . WISDOM TOOTH EXTRACTION      Family History:  Family History  Problem Relation Age of Onset  . Cancer Other   . Diabetes Other    She denies any female cancers, bleeding or blood clotting disorders.  She denies any history of mental retardation, birth defects or genetic disorders in her or the FOB's history  Social History:  Social History   Socioeconomic History  . Marital status: Single    Spouse name: Not on file  . Number of children: Not on file  . Years of education:  Not on file  . Highest education level: Not on file  Social Needs  . Financial resource strain: Not on file  . Food insecurity - worry: Not on file  . Food insecurity - inability: Not on file  . Transportation needs - medical: Not on file  . Transportation needs - non-medical: Not on file  Occupational History  . Not on file  Tobacco Use  . Smoking status: Current Every Day Smoker    Packs/day: 0.50  . Smokeless tobacco: Never Used  Substance and Sexual Activity  . Alcohol use: No  . Drug use: No  . Sexual activity: Yes    Birth control/protection: None  Other Topics Concern  . Not on file  Social History Narrative  . Not on file   No cats in household.  Allergy: Allergies  Allergen Reactions  . Shellfish Allergy Anaphylaxis  . Other     BLUEBERRIES, BANANAS, TREE NUTS, RASPBERRIES, GRAPES, POLLENS/SEASONAL.    Current Outpatient Medications:  Current Outpatient Medications:  .  albuterol (PROVENTIL HFA;VENTOLIN HFA) 108 (90 Base) MCG/ACT inhaler, Inhale 2 puffs into the lungs every 4 (four) hours as needed for wheezing or shortness of breath., Disp: 1 Inhaler, Rfl:  5 .  Doxylamine-Pyridoxine (DICLEGIS) 10-10 MG TBEC, Take 2 tablets at bedtime by mouth. If symptoms persist, add one tablet in the morning and one in the afternoon, Disp: 100 tablet, Rfl: 5 .  aspirin EC 81 MG tablet, Take 1 tablet (81 mg total) by mouth daily. Take after 12 weeks for prevention of preeclampssia later in pregnancy, Disp: 300 tablet, Rfl: 2 .  prochlorperazine (COMPAZINE) 10 MG tablet, Take 1 tablet (10 mg total) by mouth every 6 (six) hours as needed for nausea or vomiting., Disp: 30 tablet, Rfl: 3   Physical Exam:   BP 104/72   Wt 241 lb (109.3 kg)   LMP 01/07/2017   BMI 40.10 kg/m  Body mass index is 40.1 kg/m. Constitutional: Well nourished, well developed female in no acute distress.  Neck:  Supple, normal appearance, and no thyromegaly  Cardiovascular: S1, S2 normal, no murmur,  rub or gallop, regular rate and rhythm Respiratory:  Clear to auscultation bilateral. Normal respiratory effort Abdomen: positive bowel sounds and no masses, hernias; diffusely non tender to palpation, non distended Breasts: breasts appear normal, no suspicious masses, no skin or nipple changes or axillary nodes. Neuro/Psych:  Normal mood and affect.  Skin:  Warm and dry.  Lymphatic:  No inguinal lymphadenopathy.    Assessment: Ms. Woodroe ChenShivers is a 26 y.o. G3P1011 8249w1d based on Patient's last menstrual period was 01/07/2017. with an Estimated Date of Delivery: 10/14/17,  for prenatal care.  Plan:  1) Avoid alcoholic beverages. 2) Patient encouraged not to smoke.  3) Discontinue the use of all non-medicinal drugs and chemicals.  4) Take prenatal vitamins daily.  5) Seatbelt use advised 6) Nutrition, food safety (fish, cheese advisories, and high nitrite foods) and exercise discussed. 7) Hospital and practice style [facility] discussed  8) Patient is asked about travel to areas at risk for the Zika virus, and counseled to avoid travel and exposure to mosquitoes or sexual partners who may have themselves been exposed to the virus. Testing is discussed, and will be ordered as appropriate.  9) [facility] advised 10) Genetic Screening, such as with 1st Trimester Screening, cell free fetal DNA, AFP testing, and Ultrasound, as well as with amniocentesis and CVS as appropriate, is discussed with patient. She plans to have genetic testing this pregnancy. Patient desire CF screen and T1 testing. Will have her return for 1st trimester NT US within 10 days.  11) Nausea and vomiting. Has lost over 30 lbs. Is eating salad and canned fruit. Tolerating fluids. Taking diclegis daily. Will add compazine. 12) Will start baby aspirin per obesity protocol.  Problem list reviewed and updated.  Adelene Idlerhristanna Modestine Scherzinger MD [facility], [facility] 04/02/2017  9:28 AM

## 2017-04-04 ENCOUNTER — Encounter: Payer: Self-pay | Admitting: Obstetrics and Gynecology

## 2017-04-05 MED ORDER — FOLIC ACID 1 MG PO TABS: 1.0000 mg | ORAL_TABLET | Freq: Every day | ORAL | 10 refills | Status: DC

## 2017-04-05 NOTE — Addendum Note (Signed)
Addended by: Adelene IdlerSCHUMAN, Kha Hari on: 04/05/2017 08:44 AM   Modules accepted: Orders

## 2017-04-06 ENCOUNTER — Other Ambulatory Visit: Payer: Self-pay

## 2017-04-06 ENCOUNTER — Emergency Department
Admission: EM | Admit: 2017-04-06 | Discharge: 2017-04-06 | Disposition: A | Discharge status: Home / Self Care | Payer: Medicaid Other | Attending: Emergency Medicine | Admitting: Emergency Medicine

## 2017-04-06 ENCOUNTER — Emergency Department: Payer: Medicaid Other

## 2017-04-06 ENCOUNTER — Encounter: Payer: Self-pay | Admitting: Emergency Medicine

## 2017-04-06 DIAGNOSIS — Z3A13 13 weeks gestation of pregnancy: Secondary | ICD-10-CM | POA: Insufficient documentation

## 2017-04-06 DIAGNOSIS — O4690 Antepartum hemorrhage, unspecified, unspecified trimester: Secondary | ICD-10-CM

## 2017-04-06 DIAGNOSIS — B9689 Other specified bacterial agents as the cause of diseases classified elsewhere: Secondary | ICD-10-CM

## 2017-04-06 DIAGNOSIS — Z79899 Other long term (current) drug therapy: Secondary | ICD-10-CM | POA: Insufficient documentation

## 2017-04-06 DIAGNOSIS — O99332 Smoking (tobacco) complicating pregnancy, second trimester: Secondary | ICD-10-CM | POA: Diagnosis not present

## 2017-04-06 DIAGNOSIS — J45909 Unspecified asthma, uncomplicated: Secondary | ICD-10-CM | POA: Insufficient documentation

## 2017-04-06 DIAGNOSIS — O2342 Unspecified infection of urinary tract in pregnancy, second trimester: Secondary | ICD-10-CM | POA: Insufficient documentation

## 2017-04-06 DIAGNOSIS — F17200 Nicotine dependence, unspecified, uncomplicated: Secondary | ICD-10-CM | POA: Insufficient documentation

## 2017-04-06 DIAGNOSIS — O99512 Diseases of the respiratory system complicating pregnancy, second trimester: Secondary | ICD-10-CM | POA: Insufficient documentation

## 2017-04-06 DIAGNOSIS — O209 Hemorrhage in early pregnancy, unspecified: Secondary | ICD-10-CM | POA: Insufficient documentation

## 2017-04-06 DIAGNOSIS — O23592 Infection of other part of genital tract in pregnancy, second trimester: Secondary | ICD-10-CM | POA: Insufficient documentation

## 2017-04-06 DIAGNOSIS — N760 Acute vaginitis: Secondary | ICD-10-CM

## 2017-04-06 DIAGNOSIS — N390 Urinary tract infection, site not specified: Secondary | ICD-10-CM

## 2017-04-06 LAB — URINALYSIS, COMPLETE (UACMP) WITH MICROSCOPIC
Bilirubin Urine: NEGATIVE
GLUCOSE, UA: NEGATIVE mg/dL
Ketones, ur: 20 mg/dL — AB
Leukocytes, UA: NEGATIVE
Nitrite: NEGATIVE
Protein, ur: 30 mg/dL — AB
Specific Gravity, Urine: 1.03 (ref 1.005–1.030)
pH: 6 (ref 5.0–8.0)

## 2017-04-06 LAB — ABO/RH: ABO/RH(D): A POS

## 2017-04-06 LAB — WET PREP, GENITAL
SPERM: NONE SEEN
TRICH WET PREP: NONE SEEN
Yeast Wet Prep HPF POC: NONE SEEN

## 2017-04-06 LAB — CHLAMYDIA/NGC RT PCR (ARMC ONLY)
Chlamydia Tr: NOT DETECTED
N gonorrhoeae: NOT DETECTED

## 2017-04-06 LAB — POCT PREGNANCY, URINE: Preg Test, Ur: POSITIVE — AB

## 2017-04-06 LAB — HCG, QUANTITATIVE, PREGNANCY: HCG, BETA CHAIN, QUANT, S: 72646 m[IU]/mL — AB (ref <5)

## 2017-04-06 MED ORDER — NITROFURANTOIN MONOHYD MACRO 100 MG PO CAPS
100.0000 mg | ORAL_CAPSULE | Freq: Once | ORAL | Status: AC
  Administered 2017-04-06: 100 mg via ORAL
  Filled 2017-04-06: qty 1

## 2017-04-06 MED ORDER — METRONIDAZOLE 500 MG PO TABS: 500.0000 mg | ORAL_TABLET | Freq: Two times a day (BID) | ORAL | 0 refills | Status: AC

## 2017-04-06 MED ORDER — ACETAMINOPHEN 325 MG PO TABS
650.0000 mg | ORAL_TABLET | Freq: Once | ORAL | Status: AC
  Administered 2017-04-06: 650 mg via ORAL
  Filled 2017-04-06: qty 2

## 2017-04-06 MED ORDER — NITROFURANTOIN MONOHYD MACRO 100 MG PO CAPS: 100.0000 mg | ORAL_CAPSULE | Freq: Two times a day (BID) | ORAL | 0 refills | Status: AC

## 2017-04-06 MED ORDER — METRONIDAZOLE 500 MG PO TABS
500.0000 mg | ORAL_TABLET | Freq: Once | ORAL | Status: AC
  Administered 2017-04-06: 500 mg via ORAL
  Filled 2017-04-06: qty 1

## 2017-04-06 NOTE — ED Notes (Addendum)
Pt advised that another urine sample was needed - gave pt cup of water and cup for urine collection

## 2017-04-06 NOTE — ED Provider Notes (Signed)
[facility] Emergency Department Provider Note  ____________________________________________   First MD Initiated Contact with Patient 04/06/17 1743     (approximate)  I have reviewed the triage vital signs and the nursing notes.   HISTORY  Chief Complaint Vaginal Bleeding   HPI Shelby Obrien is a 26 y.o. female who is a G3 P1 who is presenting to the emergency department today with vaginal bleeding.  She says that when she woke up this afternoon her underwear was soaked with blood.  She is denying any clots but says that she does have diffuse abdominal cramping that radiates through to her back.  She does not report any discharge.  No history of STDs.  No burning with urination.  She says that she is taking prenatal vitamins and follows with [facility] OB[facility].  History of miscarriage.   Past Medical History:  Diagnosis Date  . Asthma   . Migraines     Patient Active Problem List   Diagnosis Date Noted  . Nausea and vomiting in pregnancy 03/05/2017  . Supervision of high risk pregnancy, antepartum, first trimester 02/26/2017  . Maternal obesity affecting pregnancy, antepartum 02/26/2017  . BMI 40.0-44.9, adult (HCC) 02/26/2017  . First trimester bleeding 02/16/2017  . Migraines 10/07/2016  . Asthma 10/07/2016    Past Surgical History:  Procedure Laterality Date  . WISDOM TOOTH EXTRACTION      Prior to Admission medications   Medication Sig Start Date End Date Taking? Authorizing Provider  albuterol (PROVENTIL HFA;VENTOLIN HFA) 108 (90 Base) MCG/ACT inhaler Inhale 2 puffs into the lungs every 4 (four) hours as needed for wheezing or shortness of breath. 10/07/16   Nelwyn Salisbury, MD  aspirin EC 81 MG tablet Take 1 tablet (81 mg total) by mouth daily. Take after 12 weeks for prevention of preeclampssia later in pregnancy 04/02/17   Schuman, Denman George R, MD  Doxylamine-Pyridoxine (DICLEGIS) 10-10 MG TBEC Take 2 tablets at bedtime by mouth.  If symptoms persist, add one tablet in the morning and one in the afternoon 03/05/17   Oswaldo Conroy, CNM  folic acid (FOLVITE) 1 MG tablet Take 1 tablet (1 mg total) by mouth daily. 04/05/17   Schuman, Jaquelyn Bitter, MD  prochlorperazine (COMPAZINE) 10 MG tablet Take 1 tablet (10 mg total) by mouth every 6 (six) hours as needed for nausea or vomiting. 04/02/17   Natale Milch, MD    Allergies Shellfish allergy and Other  Family History  Problem Relation Age of Onset  . Cancer Other   . Diabetes Other     Social History Social History   Tobacco Use  . Smoking status: Current Every Day Smoker    Packs/day: 0.50  . Smokeless tobacco: Never Used  Substance Use Topics  . Alcohol use: No  . Drug use: No    Review of Systems  Constitutional: No fever/chills Eyes: No visual changes. ENT: No sore throat. Cardiovascular: Denies chest pain. Respiratory: Denies shortness of breath. Gastrointestinal:  no vomiting.  No diarrhea.  No constipation. Genitourinary: Negative for dysuria. Musculoskeletal: Negative for back pain. Skin: Negative for rash. Neurological: Negative for headaches, focal weakness or numbness.   ____________________________________________   PHYSICAL EXAM:  VITAL SIGNS: ED Triage Vitals  Enc Vitals Group     BP 04/06/17 1657 127/74     Pulse Rate 04/06/17 1657 79     Resp 04/06/17 1657 18     Temp 04/06/17 1657 97.6 F (36.4 C)     Temp  Source 04/06/17 1657 Oral     SpO2 04/06/17 1657 94 %     Weight 04/06/17 1657 241 lb (109.3 kg)     Height 04/06/17 1657 5\' 4"  (1.626 m)     Head Circumference --      Peak Flow --      Pain Score 04/06/17 1747 10     Pain Loc --      Pain Edu? --      Excl. in GC? --     Constitutional: Alert and oriented. Well appearing and in no acute distress. Eyes: Conjunctivae are normal.  Head: Atraumatic. Nose: No congestion/rhinnorhea. Mouth/Throat: Mucous membranes are moist.  Neck: No stridor.     Cardiovascular: Normal rate, regular rhythm. Grossly normal heart sounds.   Respiratory: Normal respiratory effort.  No retractions. Lungs CTAB. Gastrointestinal: Soft with mild upper abdominal tenderness to palpation and moderate tenderness to palpation across lower abdomen without any focal tenderness. No distention.  Genitourinary: Normal external exam without any lesions.  Speculum exam with a small amount of maroon blood at the cervix but without any active bleeding.  Bimanual exam with a closed cervical loss.  Positive for CMT as well as uterine and bilateral adnexal tenderness without any masses palpated. Musculoskeletal: No lower extremity tenderness nor edema.  No joint effusions. Neurologic:  Normal speech and language. No gross focal neurologic deficits are appreciated. Skin:  Skin is warm, dry and intact. No rash noted. Psychiatric: Mood and affect are normal. Speech and behavior are normal.  ____________________________________________   LABS (all labs ordered are listed, but only abnormal results are displayed)  Labs Reviewed  WET PREP, GENITAL - Abnormal; Notable for the following components:      Result Value   Clue Cells Wet Prep HPF POC PRESENT (*)    WBC, Wet Prep HPF POC FEW (*)    All other components within normal limits  HCG, QUANTITATIVE, PREGNANCY - Abnormal; Notable for the following components:   hCG, Beta Chain, Quant, S 72,646 (*)    All other components within normal limits  URINALYSIS, COMPLETE (UACMP) WITH MICROSCOPIC - Abnormal; Notable for the following components:   Color, Urine YELLOW (*)    APPearance CLOUDY (*)    Hgb urine dipstick LARGE (*)    Ketones, ur 20 (*)    Protein, ur 30 (*)    Bacteria, UA RARE (*)    Squamous Epithelial / LPF 6-30 (*)    All other components within normal limits  POCT PREGNANCY, URINE - Abnormal; Notable for the following components:   Preg Test, Ur POSITIVE (*)    All other components within normal limits   CHLAMYDIA/NGC RT PCR ([facility] ONLY)  URINE CULTURE  ABO/RH   ____________________________________________  EKG   ____________________________________________  RADIOLOGY  Measuring 13 weeks and 1 day with a single IUP without any evidence of subchorionic hemorrhage ____________________________________________   PROCEDURES  Procedure(s) performed:   Procedures  Critical Care performed:   ____________________________________________   INITIAL IMPRESSION / ASSESSMENT AND PLAN / ED COURSE  Pertinent labs & imaging results that were available during my care of the patient were reviewed by me and considered in my medical decision making (see chart for details).  Differential diagnosis includes, but is not limited to, threatened miscarriage, incomplete miscarriage, normal bleeding from an early trimester pregnancy, ectopic pregnancy, , blighted ovum, vaginal/cervical trauma, subchorionic hemorrhage/hematoma, etc.  As part of my medical decision making, I reviewed the following data within the electronic MEDICAL RECORD NUMBER  Notes from prior ED visits   ----------------------------------------- 8:28 PM on 04/06/2017 -----------------------------------------  Patient without any distress at this time.  Wet prep positive for clue cells.  Also questionable UTI.  Due to the patient's bleeding as well as abdominal pain I will treat her for UTI as well as bacterial vaginosis.  She has an appointment already scheduled with was that OB/GYN this Friday.  We discussed pelvic rest.  Patient will be discharged at this time.  She is understanding of the diagnosis as well as the treatment plan willing to comply.  We discussed possible outcomes including progression of miscarriage or carrying the baby to term.     ____________________________________________   FINAL CLINICAL IMPRESSION(S) / ED DIAGNOSES  Second trimester vaginal bleeding.  UTI.  Bacterial vaginosis.    NEW  MEDICATIONS STARTED DURING THIS VISIT:  This SmartLink is deprecated. Use AVSMEDLIST instead to display the medication list for a patient.   Note:  This document was prepared using Dragon voice recognition software and may include unintentional dictation errors.     Myrna BlazerSchaevitz, Jaxon Mynhier Matthew, MD 04/06/17 2029

## 2017-04-06 NOTE — ED Triage Notes (Signed)
Pt c/o vaginal bleeding starting today. Woke up from nap and has blood in underwear.  G3P1.  No pain. Has had US to very IUP.

## 2017-04-06 NOTE — ED Notes (Signed)
Pt reports that she started today with lower abd pain (cramping) and vaginal bleeding - no clots noted - pt states she is 5465w5d pregnant - denies N/V - denies back pain

## 2017-04-09 LAB — CYSTIC FIBROSIS MUTATION 97: Interpretation: NOT DETECTED

## 2017-04-09 LAB — URINE CULTURE

## 2017-04-10 ENCOUNTER — Encounter: Payer: Self-pay | Admitting: Obstetrics and Gynecology

## 2017-04-10 ENCOUNTER — Ambulatory Visit (INDEPENDENT_AMBULATORY_CARE_PROVIDER_SITE_OTHER): Payer: BLUE CROSS/BLUE SHIELD | Admitting: Obstetrics and Gynecology

## 2017-04-10 ENCOUNTER — Ambulatory Visit (INDEPENDENT_AMBULATORY_CARE_PROVIDER_SITE_OTHER): Payer: BLUE CROSS/BLUE SHIELD

## 2017-04-10 VITALS — BP 104/72 | Wt 239.0 lb

## 2017-04-10 DIAGNOSIS — O219 Vomiting of pregnancy, unspecified: Secondary | ICD-10-CM

## 2017-04-10 DIAGNOSIS — Z23 Encounter for immunization: Secondary | ICD-10-CM | POA: Diagnosis not present

## 2017-04-10 DIAGNOSIS — Z3A13 13 weeks gestation of pregnancy: Secondary | ICD-10-CM

## 2017-04-10 DIAGNOSIS — Z6841 Body Mass Index (BMI) 40.0 and over, adult: Secondary | ICD-10-CM

## 2017-04-10 DIAGNOSIS — O0991 Supervision of high risk pregnancy, unspecified, first trimester: Secondary | ICD-10-CM

## 2017-04-10 DIAGNOSIS — O99210 Obesity complicating pregnancy, unspecified trimester: Secondary | ICD-10-CM

## 2017-04-10 DIAGNOSIS — Z1379 Encounter for other screening for genetic and chromosomal anomalies: Secondary | ICD-10-CM

## 2017-04-10 NOTE — Progress Notes (Signed)
Pt went to ER on Monday 12/10 due to vaginal bleeding. Diagnosed GBS+ urine and clue cells on wet prep. Pt states bleeding has stopped.  NT scan today.

## 2017-04-10 NOTE — Progress Notes (Signed)
Routine Prenatal Care Visit  Subjective  Shelby Obrien is a 26 y.o. G3P1011 at 7180w2d being seen today for ongoing prenatal care.  She is currently monitored for the following issues for this high-risk pregnancy and has Migraines; Asthma; First trimester bleeding; Supervision of high risk pregnancy, antepartum, first trimester; Maternal obesity affecting pregnancy, antepartum; BMI 40.0-44.9, adult (HCC); and Nausea and vomiting in pregnancy on their problem list.  ----------------------------------------------------------------------------------- Patient reports that her vomiting has improved. She has lost weight again this week. She reports that she is eating, but that she just can't eat like she wants to. She keeps food and liquids down. She reports she only vomits when she rides in the car because of motion sickness. Taking compazine. She was seen in the hospital this week for vaginal bleeding. She says she woke up in a pool of blood and had abdominal pain but when she was at the ER her bleeding has stopped. Denies intercourse. Reports event was after working a long shift at [employer].  They treated her for BV and a UTI. Her urine culture was positive weakly for GBS, but suggested repeat which was performed today. NT scan and labs today .  Contractions: Not present. Vag. Bleeding: None.   . Denies leaking of fluid.     Objective   Vitals:   04/10/17 1020  BP: 104/72  Weight: 239 lb (108.4 kg)   Pregravid Weight: 268 lb (121.6 kg)  Total Weight Gain:  (-13.154 kg)  Urinalysis: Urine Protein: Trace Urine Glucose: Negative  Fetal Status: Fetal Heart Rate (bpm): 144         General: Alert: Oriented and cooperative. Patient is in no acute distress. Skin: Skin is warm and dry. No rash noted.  Cardiovascular: Regular rate and rhythm. No murmurs, gallops, or rubs. Respiratory: Normal respiratory effort, no problems with respiration noted Abdomen: Soft, gravid, appropriate for  gestational age. Pain/Pressure: Absent Pelvic: Cervical exam deferred      Patient refused. Extremeties: Normal range of motion.    Mental Status: Normal mood and affect. Normal behavior. Normal judgment and thought content.  Assessment   26 y.o. G3P1011 at 3880w2d by  10/14/2017, by Last Menstrual Period presenting for routine prenatal visit  Plan   pregnancy Problems (from 02/16/17 to present)    Problem Noted Resolved   Supervision of high risk pregnancy, antepartum, first trimester 02/26/2017 by Oswaldo ConroySchmid, Jacelyn Y, CNM No   Overview Addendum 04/04/2017  8:28 PM by Natale MilchSchuman, Christanna R, MD    Clinic [facility] Prenatal Labs  Dating  LMP=7w US Blood type: A Pos  Genetic Screen 1 Screen:    AFP:     Quad:     NIPS: Antibody:   Anatomic US  Rubella: Immune Varicella: Immune  GTT Early:  7286             Third trimester:  RPR: NR  Rhogam  HBsAg: Negative  TDaP vaccine                       Flu Shot: HIV: NR  Baby Food                                GBS:   Contraception  Pap: NILM 10/2016  CBB     CS/VBAC    Support Person        BMI >=40 Arly.Keller[X ] early 1h gtt -  [  X ] u/s for dating [ ]   [X]  nutritional goals Arly.Keller[X ] folic acid 1mg  Arly.Keller[X ] bASA (>12 weeks) [ ]  consider nutrition consult [ ]  consider maternal EKG 1st trimester [ ]  Growth u/s 28 [ ] , 32 [ ] , 36 weeks [ ]  [ ]  NST/AFI weekly 36+ weeks (36[] , 37[] , 38[] , 39[] , 40[] ) [ ]  IOL by 41 weeks (scheduled, prn [] )       Maternal obesity affecting pregnancy, antepartum 02/26/2017 by Oswaldo ConroySchmid, Jacelyn Y, CNM No   BMI 40.0-44.9, adult (HCC) 02/26/2017 by Oswaldo ConroySchmid, Jacelyn Y, CNM No   First trimester bleeding 02/16/2017 by Conard NovakJackson, Stephen D, MD No       Preterm labor symptoms and general obstetric precautions including but not limited to vaginal bleeding, contractions, leaking of fluid and fetal movement were reviewed in detail with the patient.   Urine Culture NT scan and labs today  Please refer to After Visit Summary for other counseling  recommendations.   Return in about 3 weeks (around 05/01/2017) for ROB.  Adelene Idlerhristanna Schuman M.D. @DATE @

## 2017-04-10 NOTE — Progress Notes (Signed)
ED CULTURE REPORT   [name] is a 26 yo pregnant female seen in ED on 12/10 with c/o vaginal bleeding and lower abdominal pain/cramping with no burning on urination. She was evaluated, a urine culture was drawn, and she was discharged with metronidazole 500mg  BID x 7 days and nitrofurantoin 100mg  BID for 7 days for suspected UTI and bacterial vaginosis. ED culture report on 12/14 showed 1,000 colonies/mL of Group B Strep. Discussed case with ED MD Dr. Roxan Hockeyobinson who asked me to call results into patient's OBGYN for them to decide course of treatment. Called [facility] and spoke with Diona BrownerKaycee who is the nurse of Dr. Jerene PitchSchuman, the patient's OBGYN. I relayed the antibiotics the patient was discharged on as well as the results of the UA and urine culture to the nurse who reported that the patient was being seen that day in the clinic. I also faxed the results to the clinic (fax # [phone]).   Yolanda BonineHannah Weylin Plagge, PharmD Pharmacy Resident

## 2017-04-12 LAB — URINE CULTURE

## 2017-04-13 NOTE — Progress Notes (Signed)
Called and discussed with patient

## 2017-04-13 NOTE — Progress Notes (Signed)
Called and spoke with patient about results.

## 2017-04-15 LAB — FIRST TRIMESTER SCREEN W/NT
CRL: 75.2 mm
DIA MOM: 1.76
DIA VALUE: 294.8 pg/mL
GEST AGE-COLLECT: 13.1 wk
Maternal Age At EDD: 28.4 yr
NUMBER OF FETUSES: 1
Nuchal Translucency MoM: 0.87
Nuchal Translucency: 1.6 mm
PAPP-A MOM: 1.66
PAPP-A Value: 1155.2 ng/mL
TEST RESULTS: NEGATIVE
Weight: 239 [lb_av]
hCG MoM: 1.26
hCG Value: 76 IU/mL

## 2017-04-15 NOTE — Progress Notes (Signed)
Negative. Called and reviewed with patient

## 2017-04-21 ENCOUNTER — Encounter: Payer: Self-pay | Admitting: Emergency Medicine

## 2017-04-21 ENCOUNTER — Emergency Department: Payer: Medicaid Other

## 2017-04-21 ENCOUNTER — Emergency Department
Admission: EM | Admit: 2017-04-21 | Discharge: 2017-04-21 | Disposition: A | Discharge status: Home / Self Care | Payer: Medicaid Other | Attending: Emergency Medicine | Admitting: Emergency Medicine

## 2017-04-21 DIAGNOSIS — Y93E8 Activity, other personal hygiene: Secondary | ICD-10-CM | POA: Diagnosis not present

## 2017-04-21 DIAGNOSIS — Y999 Unspecified external cause status: Secondary | ICD-10-CM | POA: Diagnosis not present

## 2017-04-21 DIAGNOSIS — G20 Parkinson's disease: Secondary | ICD-10-CM | POA: Insufficient documentation

## 2017-04-21 DIAGNOSIS — Z79899 Other long term (current) drug therapy: Secondary | ICD-10-CM | POA: Insufficient documentation

## 2017-04-21 DIAGNOSIS — F0390 Unspecified dementia without behavioral disturbance: Secondary | ICD-10-CM | POA: Insufficient documentation

## 2017-04-21 DIAGNOSIS — O200 Threatened abortion: Secondary | ICD-10-CM

## 2017-04-21 DIAGNOSIS — S0101XA Laceration without foreign body of scalp, initial encounter: Secondary | ICD-10-CM | POA: Diagnosis not present

## 2017-04-21 DIAGNOSIS — S0990XA Unspecified injury of head, initial encounter: Secondary | ICD-10-CM | POA: Diagnosis present

## 2017-04-21 DIAGNOSIS — W01198A Fall on same level from slipping, tripping and stumbling with subsequent striking against other object, initial encounter: Secondary | ICD-10-CM | POA: Insufficient documentation

## 2017-04-21 DIAGNOSIS — N939 Abnormal uterine and vaginal bleeding, unspecified: Secondary | ICD-10-CM

## 2017-04-21 DIAGNOSIS — Z87891 Personal history of nicotine dependence: Secondary | ICD-10-CM | POA: Diagnosis not present

## 2017-04-21 DIAGNOSIS — Y929 Unspecified place or not applicable: Secondary | ICD-10-CM | POA: Diagnosis not present

## 2017-04-21 DIAGNOSIS — Z7982 Long term (current) use of aspirin: Secondary | ICD-10-CM | POA: Diagnosis not present

## 2017-04-21 DIAGNOSIS — I10 Essential (primary) hypertension: Secondary | ICD-10-CM | POA: Insufficient documentation

## 2017-04-21 DIAGNOSIS — O4690 Antepartum hemorrhage, unspecified, unspecified trimester: Secondary | ICD-10-CM

## 2017-04-21 LAB — HCG, QUANTITATIVE, PREGNANCY: HCG, BETA CHAIN, QUANT, S: 51959 m[IU]/mL — AB (ref <5)

## 2017-04-21 NOTE — ED Notes (Signed)
Pelvic cart at bedside. Pt was transported to [place]

## 2017-04-21 NOTE — ED Provider Notes (Signed)
[facility] Emergency Department Provider Note   ____________________________________________    I have reviewed the triage vital signs and the nursing notes.   HISTORY  Chief Complaint Vaginal Bleeding     HPI Shelby Obrien is a 26 y.o. female who presents with vaginal bleeding.  Patient reports she is approximately [redacted] weeks pregnant.  She notes today that she passed a single clot and had some spotting afterwards.  She reports vaginal bleeding last week, did see her OB who told her that if she passed clots come to the emergency department.  She has not had any bleeding over the last 4 days.  Denies abdominal pain.  No fevers or chills or dysuria  Past Medical History:  Diagnosis Date  . Asthma   . Migraines     Patient Active Problem List   Diagnosis Date Noted  . Nausea and vomiting in pregnancy 03/05/2017  . Supervision of high risk pregnancy, antepartum, first trimester 02/26/2017  . Maternal obesity affecting pregnancy, antepartum 02/26/2017  . BMI 40.0-44.9, adult (HCC) 02/26/2017  . First trimester bleeding 02/16/2017  . Migraines 10/07/2016  . Asthma 10/07/2016    Past Surgical History:  Procedure Laterality Date  . WISDOM TOOTH EXTRACTION      Prior to Admission medications   Medication Sig Start Date End Date Taking? Authorizing Provider  albuterol (PROVENTIL HFA;VENTOLIN HFA) 108 (90 Base) MCG/ACT inhaler Inhale 2 puffs into the lungs every 4 (four) hours as needed for wheezing or shortness of breath. 10/07/16   Nelwyn SalisburyFry, Stephen A, MD  aspirin EC 81 MG tablet Take 1 tablet (81 mg total) by mouth daily. Take after 12 weeks for prevention of preeclampssia later in pregnancy 04/02/17   Schuman, Denman Georgehristanna R, MD  Doxylamine-Pyridoxine (DICLEGIS) 10-10 MG TBEC Take 2 tablets at bedtime by mouth. If symptoms persist, add one tablet in the morning and one in the afternoon Patient not taking: Reported on 04/10/2017 03/05/17   Oswaldo ConroySchmid,  Jacelyn Y, CNM  folic acid (FOLVITE) 1 MG tablet Take 1 tablet (1 mg total) by mouth daily. 04/05/17   Schuman, Jaquelyn Bitterhristanna R, MD  prochlorperazine (COMPAZINE) 10 MG tablet Take 1 tablet (10 mg total) by mouth every 6 (six) hours as needed for nausea or vomiting. 04/02/17   Natale MilchSchuman, Christanna R, MD     Allergies Shellfish allergy and Other  Family History  Problem Relation Age of Onset  . Cancer Other   . Diabetes Other     Social History Social History   Tobacco Use  . Smoking status: Former Smoker    Packs/day: 0.00  . Smokeless tobacco: Never Used  Substance Use Topics  . Alcohol use: No  . Drug use: No    Review of Systems  Constitutional: No dizziness Eyes: No visual changes.  ENT: No sore throat. Cardiovascular: Denies palpitations Respiratory: Denies shortness of breath. Gastrointestinal: No abdominal pain.  No nausea, no vomiting.   Genitourinary: As above Musculoskeletal: Negative for back pain. Skin: Negative for rash. Neurological: Negative for headaches   ____________________________________________   PHYSICAL EXAM:  VITAL SIGNS: ED Triage Vitals  Enc Vitals Group     BP 04/21/17 1446 124/82     Pulse Rate 04/21/17 1446 64     Resp 04/21/17 1446 16     Temp 04/21/17 1446 98.2 F (36.8 C)     Temp Source 04/21/17 1446 Oral     SpO2 04/21/17 1446 99 %     Weight 04/21/17 1447 108.4  kg (239 lb)     Height 04/21/17 1447 1.626 m (5\' 4" )     Head Circumference --      Peak Flow --      Pain Score 04/21/17 1446 7     Pain Loc --      Pain Edu? --      Excl. in GC? --     Constitutional: Alert and oriented. No acute distress. Pleasant and interactive Eyes: Conjunctivae are normal.    Mouth/Throat: Mucous membranes are moist.   M Cardiovascular: Normal rate, regular rhythm. Grossly normal heart sounds.  Good peripheral circulation. Respiratory: Normal respiratory effort.  No retractions. Lungs CTAB. Gastrointestinal: Soft and nontender. No  distention.  No CVA tenderness. Genitourinary: deferred, no further bleeding Musculoskeletal: .  Warm and well perfused Neurologic:  Normal speech and language. No gross focal neurologic deficits are appreciated.  Skin:  Skin is warm, dry and intact. No rash noted. Psychiatric: Mood and affect are normal. Speech and behavior are normal.  ____________________________________________   LABS (all labs ordered are listed, but only abnormal results are displayed)  Labs Reviewed  HCG, QUANTITATIVE, PREGNANCY - Abnormal; Notable for the following components:      Result Value   hCG, Beta Chain, Quant, S 51,959 (*)    All other components within normal limits   ____________________________________________  EKG  None ____________________________________________  RADIOLOGY  Ultrasound shows viable intrauterine pregnancy, follow-up regarding marginal placenta ____________________________________________   PROCEDURES  Procedure(s) performed: No  Procedures   Critical Care performed: No ____________________________________________   INITIAL IMPRESSION / ASSESSMENT AND PLAN / ED COURSE  Pertinent labs & imaging results that were available during my care of the patient were reviewed by me and considered in my medical decision making (see chart for details).  Patient well-appearing in no acute distress.  Vital signs are unremarkable.  Lab work is reassuring, ultrasound shows viable intrauterine pregnancy, discussed with her the need for follow-up regarding ultrasound findings regarding placenta    ____________________________________________   FINAL CLINICAL IMPRESSION(S) / ED DIAGNOSES  Final diagnoses:  Vaginal bleeding in pregnancy  Threatened miscarriage        Note:  This document was prepared using Dragon voice recognition software and may include unintentional dictation errors.    Jene EveryKinner, Aasiya Creasey, MD 04/21/17 [phone]

## 2017-04-21 NOTE — ED Notes (Signed)
Pt states she had vaginal bleeding around 1 month ago, last week 3 felt like a light normal menstrual cycle,  Bleeding stopped 12/20. Pt states she felt urge to urinate earlier today around 1430, unable to actually urinate at that time but passed a 1 1/2 " blood clot followed by bright red bleeding lasting until around 3pm. Pt currently has no bleeding. Pt complains of cramping on left side lower abdomen.

## 2017-04-21 NOTE — ED Triage Notes (Signed)
First Nurse Note:  Arrives with c/o vaginal bleeding, onset of symptoms approximately 15 min PTA.  Patient states she is [redacted] weeks pregnant.  ED[facility]  [facility]  G3 P1.

## 2017-04-21 NOTE — ED Triage Notes (Signed)
Patient presents to ED via POV from work with c/o vaginal bleeding. [facility]. Patient is [redacted] weeks pregnant. Hx of miscarriage with her first pregnancy. Patient reports passing clots as well. Patient reports lower abdominal pain.

## 2017-04-23 ENCOUNTER — Ambulatory Visit (INDEPENDENT_AMBULATORY_CARE_PROVIDER_SITE_OTHER): Payer: BLUE CROSS/BLUE SHIELD | Admitting: Obstetrics and Gynecology

## 2017-04-23 VITALS — BP 110/70 | Wt 240.0 lb

## 2017-04-23 DIAGNOSIS — O4402 Placenta previa specified as without hemorrhage, second trimester: Secondary | ICD-10-CM | POA: Insufficient documentation

## 2017-04-23 DIAGNOSIS — O219 Vomiting of pregnancy, unspecified: Secondary | ICD-10-CM

## 2017-04-23 DIAGNOSIS — O0991 Supervision of high risk pregnancy, unspecified, first trimester: Secondary | ICD-10-CM

## 2017-04-23 DIAGNOSIS — Z3A15 15 weeks gestation of pregnancy: Secondary | ICD-10-CM

## 2017-04-23 DIAGNOSIS — Z6841 Body Mass Index (BMI) 40.0 and over, adult: Secondary | ICD-10-CM

## 2017-04-23 DIAGNOSIS — O99210 Obesity complicating pregnancy, unspecified trimester: Secondary | ICD-10-CM

## 2017-04-23 NOTE — Progress Notes (Signed)
Routine Prenatal Care Visit  Subjective  Shelby Obrien is a 26 y.o. G3P1011 at 3587w1d being seen today for ongoing prenatal care.  She is currently monitored for the following issues for this high-risk pregnancy and has Migraines; Asthma; First trimester bleeding; Supervision of high risk pregnancy, antepartum, first trimester; Maternal obesity affecting pregnancy, antepartum; BMI 40.0-44.9, adult (HCC); Nausea and vomiting in pregnancy; and Placenta previa antepartum in second trimester on their problem list.  ----------------------------------------------------------------------------------- Patient reports she was in the ER on 04/21/17 for vaginal bleeding. She was at work and passed a clot and had uterine cramping. She went to the ER and was evaluated. While there the vaginal bleeding stopped. She has not had bleeding since. She has an US which showed a low lying/maginal placenta, possible previa. Discussed risks with patient. 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 She is the only household income and needs to continue working. 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 She has been working 12 hour shifts because of the holidays and recent snow storm, but her hours have been reduced now back to 7hr shifts. She does not lift heavy objects. She would not like to be put off work at this time or a work note asking for reduced work load. .   Contractions: Not present. Vag. Bleeding: None.   . Denies leaking of fluid.  ----------------------------------------------------------------------------------- The following portions of the patient's history were reviewed and updated as appropriate: allergies, current medications, past family history, past medical history, past social history, past surgical history and problem list. Problem list updated.   Objective  Blood pressure 110/70, weight 240 lb (108.9 kg), last menstrual period  01/07/2017. Pregravid weight 268 lb (121.6 kg) Total Weight Gain  (-12.701 kg) Urinalysis: Urine Protein: Negative Urine Glucose: Negative  Fetal Status: Fetal Heart Rate (bpm): 145         General:  Alert, oriented and cooperative. Patient is in no acute distress.  Skin: Skin is warm and dry. No rash noted.   Cardiovascular: Normal heart rate noted  Respiratory: Normal respiratory effort, no problems with respiration noted  Abdomen: Soft, gravid, appropriate for gestational age. Pain/Pressure: Present     Pelvic:  Cervical exam deferred        Extremities: Normal range of motion.     ental Status: Normal mood and affect. Normal behavior. Normal judgment and thought content.     Assessment   26 y.o. G3P1011 at 4787w1d by  10/14/2017, by Last Menstrual Period presenting for work-in prenatal visit  Plan   pregnancy Problems (from 02/16/17 to present)    Problem Noted Resolved   Placenta previa antepartum in second trimester 04/23/2017 by Natale MilchSchuman, Christanna R, MD No   Overview Addendum 04/23/2017  9:50 AM by Natale MilchSchuman, Christanna R, MD    Low lying/marginal placenta on ER US 04/21/17 [ ]  Will need follow up at anatomy US 18-20 weeks      Nausea and vomiting in pregnancy 03/05/2017 by Oswaldo ConroySchmid, Jacelyn Y, CNM No   Supervision of high risk pregnancy, antepartum, first trimester 02/26/2017 by Oswaldo ConroySchmid, Jacelyn Y, CNM No   Overview Addendum 04/04/2017  8:28 PM by Natale MilchSchuman, Christanna R, MD    Clinic [facility] Prenatal Labs  Dating  LMP=7w US Blood type: A Pos  Genetic Screen 1 Screen:    AFP:     Quad:     NIPS: Antibody:   Anatomic US  Rubella:  Immune Varicella: Immune  GTT Early:  4586             Third trimester:  RPR: NR  Rhogam  HBsAg: Negative  TDaP vaccine                       Flu Shot: HIV: NR  Baby Food                                GBS:   Contraception  Pap: NILM 10/2016  CBB     CS/VBAC    Support Person        BMI >=40 Arly.Keller[X ] early 1h gtt -  Arly.Keller[X ] u/s for dating [ ]   [ X]  nutritional goals [X]  folic acid 1mg  Arly.Keller[X ] bASA (>12 weeks) [ ]  consider nutrition consult [ ]  consider maternal EKG 1st trimester [ ]  Growth u/s 28 [ ] , 32 [ ] , 36 weeks [ ]  [ ]  NST/AFI weekly 36+ weeks (36[] , 37[] , 38[] , 39[] , 40[] ) [ ]  IOL by 41 weeks (scheduled, prn [] )       Maternal obesity affecting pregnancy, antepartum 02/26/2017 by Oswaldo ConroySchmid, Jacelyn Y, CNM No   BMI 40.0-44.9, adult (HCC) 02/26/2017 by Oswaldo ConroySchmid, Jacelyn Y, CNM No   First trimester bleeding 02/16/2017 by Conard NovakJackson, Stephen D, MD No       Preterm labor symptoms and general obstetric precautions including but not limited to vaginal bleeding, contractions, leaking of fluid and fetal movement were reviewed in detail with the patient. Please refer to After Visit Summary for other counseling recommendations.   Bleeding precautions discussed. Discussed pelvic rest. Reevaluate placentation at anatomy US. Will need to be taken off work if these bleeding events continue. Patient will discuss with boss breaks and switching job role.  Nausea has improved with compazine.   Return in about 1 week (around 04/30/2017) for next week as planned ROB.

## 2017-04-28 NOTE — L&D Delivery Note (Signed)
Delivery Note At 7:16 PM a viable female was delivered via Vaginal, Spontaneous (Presentation: ROA).  APGAR: 8, 9; weight pending.   Placenta status: spontaneous, intact.  Cord: 3VC, with the following complications: none.  Cord pH: n/a  Anesthesia:  epidural Episiotomy: None Lacerations: 1st degree;Vaginal Suture Repair: n/a, hemostatic Est. Blood Loss (mL): 250  Mom to postpartum.  Baby to Couplet care / Skin to Skin.  Called to see patient.  Mom pushed to deliver a viable female infant.  The head followed by shoulders, which delivered without difficulty, and the rest of the body.  No nuchal cord noted.  Baby to mom's chest.  Cord clamped and cut after > 1 min delay.  No cord blood obtained.  Placenta delivered spontaneously, intact, with a 3-vessel cord.  Small first degree vaginal laceration noted without need to repair due to hemostasis.  Perineum, vaginal and cervix inspected without other defects noted.  All counts correct.  Hemostasis obtained with IV pitocin and fundal massage. EBL 250 mL.     Shelby MohairStephen Brnadon Eoff, MD 10/03/2017, 7:31 PM

## 2017-05-01 ENCOUNTER — Ambulatory Visit (INDEPENDENT_AMBULATORY_CARE_PROVIDER_SITE_OTHER): Payer: BLUE CROSS/BLUE SHIELD | Admitting: Maternal Newborn

## 2017-05-01 ENCOUNTER — Encounter: Payer: Self-pay | Admitting: Maternal Newborn

## 2017-05-01 VITALS — BP 120/70 | Wt 238.0 lb

## 2017-05-01 DIAGNOSIS — O0991 Supervision of high risk pregnancy, unspecified, first trimester: Secondary | ICD-10-CM

## 2017-05-01 DIAGNOSIS — Z3A16 16 weeks gestation of pregnancy: Secondary | ICD-10-CM

## 2017-05-01 DIAGNOSIS — O219 Vomiting of pregnancy, unspecified: Secondary | ICD-10-CM

## 2017-05-01 DIAGNOSIS — Z3689 Encounter for other specified antenatal screening: Secondary | ICD-10-CM

## 2017-05-01 MED ORDER — ONDANSETRON 4 MG PO TBDP: 4.0000 mg | ORAL_TABLET | Freq: Four times a day (QID) | ORAL | 0 refills | Status: DC | PRN

## 2017-05-01 NOTE — Progress Notes (Signed)
C/o no appetite, same pains.rj

## 2017-05-01 NOTE — Patient Instructions (Signed)

## 2017-05-01 NOTE — Progress Notes (Addendum)
Routine Prenatal Care Visit  Subjective  Shelby Obrien is a 27 y.o. G3P1011 at 2636w2d being seen today for ongoing prenatal care.  She is currently monitored for the following issues for this high-risk pregnancy and has Migraines; Asthma; First trimester bleeding; Supervision of high risk pregnancy, antepartum, first trimester; Maternal obesity affecting pregnancy, antepartum; BMI 40.0-44.9, adult (HCC); Nausea and vomiting in pregnancy; and Placenta previa antepartum in second trimester on their problem list.  ----------------------------------------------------------------------------------- Patient reports fatigue, nausea and no bleeding. Some sharp pains in lower uterus after standing for long periods while working. Contractions: Not present. Vag. Bleeding: None.  Movement: Present. Denies leaking of fluid.  ----------------------------------------------------------------------------------- The following portions of the patient's history were reviewed and updated as appropriate: allergies, current medications, past family history, past medical history, past social history, past surgical history and problem list. Problem list updated.   Objective  Blood pressure 120/70, weight 238 lb (108 kg), last menstrual period 01/07/2017. Pregravid weight 268 lb (121.6 kg) Total Weight Gain  (-13.608 kg) Urinalysis: Urine Protein: Negative Urine Glucose: Negative  Fetal Status: Fetal Heart Rate (bpm): 143   Movement: Present     General:  Alert, oriented and cooperative. Patient is in no acute distress.  Skin: Skin is warm and dry. No rash noted.   Cardiovascular: Normal heart rate noted  Respiratory: Normal respiratory effort, no problems with respiration noted  Abdomen: Soft, gravid, appropriate for gestational age. Pain/Pressure: Present     Pelvic:  Cervical exam deferred        Extremities: Normal range of motion.  Edema: None  Mental Status: Normal mood and affect. Normal  behavior. Normal judgment and thought content.     Assessment   27 y.o. G3P1011 at 6236w2d by  10/14/2017, by Last Menstrual Period presenting for routine prenatal visit  Plan   pregnancy Problems (from 02/16/17 to present)    Problem Noted Resolved   Placenta previa antepartum in second trimester 04/23/2017 by Natale MilchSchuman, Christanna R, MD No   Overview Addendum 04/23/2017  9:50 AM by Natale MilchSchuman, Christanna R, MD    Low lying/marginal placenta on ER US 04/21/17 [ ]  Will need follow up at anatomy US 18-20 weeks      Nausea and vomiting in pregnancy 03/05/2017 by Oswaldo ConroySchmid, Jacelyn Y, CNM No   Supervision of high risk pregnancy, antepartum, first trimester 02/26/2017 by Oswaldo ConroySchmid, Jacelyn Y, CNM No   Overview Addendum 04/04/2017  8:28 PM by Natale MilchSchuman, Christanna R, MD    Clinic [facility] Prenatal Labs  Dating  LMP=7w US Blood type: A Pos  Genetic Screen 1 Screen:    AFP:     Quad:     NIPS: Antibody:   Anatomic US  Rubella: Immune Varicella: Immune  GTT Early:  5386             Third trimester:  RPR: NR  Rhogam  HBsAg: Negative  TDaP vaccine                       Flu Shot: HIV: NR  Baby Food                                GBS:   Contraception  Pap: NILM 10/2016  CBB     CS/VBAC    Support Person        BMI >=40 Arly.Keller[X ] early 1h gtt -  Arly.Keller[X ] u/s for  dating [ ]   [ ]  nutritional goals [ ]  folic acid 1mg  Arly.Keller ] bASA (>12 weeks) [ ]  consider nutrition consult [ ]  consider maternal EKG 1st trimester [ ]  Growth u/s 28 [ ] , 32 [ ] , 36 weeks [ ]  [ ]  NST/AFI weekly 36+ weeks (36[] , 37[] , 38[] , 39[] , 40[] ) [ ]  IOL by 41 weeks (scheduled, prn [] )       Maternal obesity affecting pregnancy, antepartum 02/26/2017 by Oswaldo Conroy, CNM No   BMI 40.0-44.9, adult (HCC) 02/26/2017 by Oswaldo Conroy, CNM No   First trimester bleeding 02/16/2017 by Conard Novak, MD No     No bleeding episodes. Encouraged reduction of work hours and seated rest breaks when possible. Discussed use of belly bands and other  supportive measures. Compazine no longer helping with nausea and has already tried Diclegis, sent Rx for Zofran.  Preterm labor symptoms and general obstetric precautions including but not limited to vaginal bleeding, contractions, leaking of fluid and fetal movement were reviewed in detail with the patient.  Please refer to After Visit Summary for other counseling recommendations.   Return in about 4 weeks (around 05/29/2017) for ROB following anatomy ultrasound.  Marcelyn Bruins, CNM 05/01/2017  9:45 AM

## 2017-05-29 ENCOUNTER — Ambulatory Visit (INDEPENDENT_AMBULATORY_CARE_PROVIDER_SITE_OTHER): Payer: BLUE CROSS/BLUE SHIELD | Admitting: Certified Nurse Midwife

## 2017-05-29 ENCOUNTER — Ambulatory Visit (INDEPENDENT_AMBULATORY_CARE_PROVIDER_SITE_OTHER): Payer: BLUE CROSS/BLUE SHIELD

## 2017-05-29 VITALS — BP 112/72 | Wt 238.0 lb

## 2017-05-29 DIAGNOSIS — Z3689 Encounter for other specified antenatal screening: Secondary | ICD-10-CM

## 2017-05-29 DIAGNOSIS — O0992 Supervision of high risk pregnancy, unspecified, second trimester: Secondary | ICD-10-CM

## 2017-05-29 DIAGNOSIS — Z6841 Body Mass Index (BMI) 40.0 and over, adult: Secondary | ICD-10-CM

## 2017-05-29 DIAGNOSIS — Z3A20 20 weeks gestation of pregnancy: Secondary | ICD-10-CM

## 2017-05-29 NOTE — Progress Notes (Signed)
HROB at 20 wk2d and follow up after anatomy scan Nausea and vomiting has stopped, no weight loss since last visit. Has lost 30+# with pregnancy and BMI has gone from 46 to 40.85 kg/m2 Feels full fast-eating small amounts frequently Feeling FM x 2 weeks CGA 20wk2d, breech presentation, posterior placenta (no longer low lying), normal anatomy, baby girl ROB in 4 weeks

## 2017-05-29 NOTE — Progress Notes (Signed)
Pt reports no problems. Anatomy scan today. It's a GIRL! C/o no appetite.

## 2017-06-26 ENCOUNTER — Encounter: Payer: Self-pay | Admitting: Obstetrics and Gynecology

## 2017-06-26 ENCOUNTER — Ambulatory Visit (INDEPENDENT_AMBULATORY_CARE_PROVIDER_SITE_OTHER): Payer: BLUE CROSS/BLUE SHIELD | Admitting: Obstetrics and Gynecology

## 2017-06-26 VITALS — Wt 247.0 lb

## 2017-06-26 DIAGNOSIS — O99210 Obesity complicating pregnancy, unspecified trimester: Secondary | ICD-10-CM

## 2017-06-26 DIAGNOSIS — Z3A24 24 weeks gestation of pregnancy: Secondary | ICD-10-CM

## 2017-06-26 DIAGNOSIS — O0991 Supervision of high risk pregnancy, unspecified, first trimester: Secondary | ICD-10-CM

## 2017-06-26 MED ORDER — OMEPRAZOLE 20 MG PO CPDR: 20.0000 mg | DELAYED_RELEASE_CAPSULE | Freq: Every day | ORAL | 11 refills | Status: DC

## 2017-06-26 NOTE — Progress Notes (Signed)
ROB Braxton hicks Nausea

## 2017-06-26 NOTE — Progress Notes (Signed)
Routine Prenatal Care Visit  Subjective  Shelby Obrien is a 27 y.o. G3P1011 at 3115w2d being seen today for ongoing prenatal care.  She is currently monitored for the following issues for this high-risk pregnancy and has Migraines; Asthma; Supervision of high risk pregnancy, antepartum, first trimester; Maternal obesity affecting pregnancy, antepartum; BMI 40.0-44.9, adult (HCC); and Nausea and vomiting in pregnancy on their problem list.  ----------------------------------------------------------------------------------- Patient reports no complaints and rare contractions every few hours no patterns.  .   Contractions: Irregular. Vag. Bleeding: None.  Movement: Present. Denies leaking of fluid.  ----------------------------------------------------------------------------------- The following portions of the patient's history were reviewed and updated as appropriate: allergies, current medications, past family history, past medical history, past social history, past surgical history and problem list. Problem list updated.   Objective  Weight 247 lb (112 kg), last menstrual period 01/07/2017, unknown if currently breastfeeding. Pregravid weight 268 lb (121.6 kg) Total Weight Gain  (-9.526 kg) Urinalysis: Urine Protein: Trace Urine Glucose: Negative  Fetal Status: Fetal Heart Rate (bpm): 145 Fundal Height: 25 cm Movement: Present     General:  Alert, oriented and cooperative. Patient is in no acute distress.  Skin: Skin is warm and dry. No rash noted.   Cardiovascular: Normal heart rate noted  Respiratory: Normal respiratory effort, no problems with respiration noted  Abdomen: Soft, gravid, appropriate for gestational age. Pain/Pressure: Absent     Pelvic:  Cervical exam deferred        Extremities: Normal range of motion.     ental Status: Normal mood and affect. Normal behavior. Normal judgment and thought content.     Assessment   27 y.o. G3P1011 at 3415w2d by  10/14/2017,  by Last Menstrual Period presenting for routine prenatal visit  Plan   pregnancy Problems (from 02/16/17 to 06/26/17)    Problem Noted Resolved   Nausea and vomiting in pregnancy 03/05/2017 by Oswaldo ConroySchmid, Jacelyn Y, CNM No   Supervision of high risk pregnancy, antepartum, first trimester 02/26/2017 by Oswaldo ConroySchmid, Jacelyn Y, CNM No   Overview Addendum 04/04/2017  8:28 PM by Natale MilchSchuman, Christanna R, MD    [facility]inic [facility] Prenatal Labs  Dating  LMP=7w US Blood type: A Pos  Genetic Screen 1 Screen:    AFP:     Quad:     NIPS: Antibody:   Anatomic US  Rubella: Immune Varicella: Immune  GTT Early:  6086             Third trimester:  RPR: NR  Rhogam  HBsAg: Negative  TDaP vaccine                       Flu Shot: HIV: NR  Baby Food                                GBS:   Contraception  Pap: NILM 10/2016  CBB     CS/VBAC    Support Person        BMI >=40 Arly.Keller[X ] early 1h gtt -  Arly.Keller[X ] u/s for dating [ ]   [ ]  nutritional goals [ ]  folic acid 1mg  Arly.Keller[X ] bASA (>12 weeks) [ ]  consider nutrition consult [ ]  consider maternal EKG 1st trimester [ ]  Growth u/s 28 [ ] , 32 [ ] , 36 weeks [ ]  [ ]  NST/AFI weekly 36+ weeks (36[] , 37[] , 38[] , 39[] , 40[] ) [ ]  IOL by 41 weeks (scheduled, prn [] )  Maternal obesity affecting pregnancy, antepartum 02/26/2017 by Oswaldo Conroy, CNM No   BMI 40.0-44.9, adult (HCC) 02/26/2017 by Oswaldo Conroy, CNM No   Placenta previa antepartum in second trimester 04/23/2017 by Natale Milch, MD 05/29/2017 by Farrel Conners, CNM   Overview Addendum 04/23/2017  9:50 AM by Natale Milch, MD    Low lying/marginal placenta on ER [place] 04/21/17 [ ]  Will need follow up at anatomy US 18-20 weeks      First trimester bleeding 02/16/2017 by Conard Novak, MD 06/26/2017 by Vena Austria, MD       Gestational age appropriate obstetric precautions including but not limited to vaginal bleeding, contractions, leaking of fluid and fetal movement were reviewed in detail with  the patient.   - 28 WEEK LABS AND GROWTH SCAN NEXT VISIT - RX OMEPRAZOLE HEART BURN - VERY IRREGULAR CONTRACTIONS, declined cervical check  Return in about 4 weeks (around 07/24/2017) for rob AND 28 WEEK LABS AND GROWTH SCAN.  Vena Austria, MD, [facility], [facility] 06/26/2017, 12:49 PM

## 2017-07-15 ENCOUNTER — Ambulatory Visit (INDEPENDENT_AMBULATORY_CARE_PROVIDER_SITE_OTHER): Payer: BLUE CROSS/BLUE SHIELD | Admitting: Maternal Newborn

## 2017-07-15 VITALS — BP 130/90 | Wt 246.0 lb

## 2017-07-15 DIAGNOSIS — O219 Vomiting of pregnancy, unspecified: Secondary | ICD-10-CM

## 2017-07-15 DIAGNOSIS — O99612 Diseases of the digestive system complicating pregnancy, second trimester: Secondary | ICD-10-CM

## 2017-07-15 DIAGNOSIS — Z6841 Body Mass Index (BMI) 40.0 and over, adult: Secondary | ICD-10-CM

## 2017-07-15 DIAGNOSIS — K5900 Constipation, unspecified: Secondary | ICD-10-CM

## 2017-07-15 DIAGNOSIS — O99210 Obesity complicating pregnancy, unspecified trimester: Secondary | ICD-10-CM

## 2017-07-15 DIAGNOSIS — O0991 Supervision of high risk pregnancy, unspecified, first trimester: Secondary | ICD-10-CM

## 2017-07-15 MED ORDER — DOCUSATE SODIUM 100 MG PO CAPS: 100.0000 mg | ORAL_CAPSULE | Freq: Two times a day (BID) | ORAL | 2 refills | Status: DC | PRN

## 2017-07-15 MED ORDER — SIMETHICONE 80 MG PO CHEW: 80.0000 mg | CHEWABLE_TABLET | Freq: Four times a day (QID) | ORAL | 0 refills | Status: DC | PRN

## 2017-07-15 NOTE — Progress Notes (Signed)
C/o has been having some sharp pains, B-H ctxs q4530min, makes her dizzy every time she has one, sciatic nerve earlier this week. Has BM q3-4d. Does not hurt to pee. Good FM.rj

## 2017-07-15 NOTE — Progress Notes (Signed)
Routine Prenatal Care Visit  Subjective  Shelby Obrien is a 27 y.o. G3P1011 at [redacted]w[redacted]d being seen today for ongoing prenatal care.  She is currently monitored for the following issues for this high-risk pregnancy and has Migraines; Asthma; Supervision of high risk pregnancy, antepartum, first trimester; Maternal obesity affecting pregnancy, antepartum; BMI 40.0-44.9, adult (HCC); and Nausea and vomiting in pregnancy on their problem list.  ----------------------------------------------------------------------------------- Patient reports constipation.  She has has some intermittent sharp abdominal pains combined with Deberah Pelton. Sciatica has also been flaring up this week. Contractions: Not present. Vag. Bleeding: None.  Movement: Present. Denies leaking of fluid.  ----------------------------------------------------------------------------------- The following portions of the patient's history were reviewed and updated as appropriate: allergies, current medications, past family history, past medical history, past social history, past surgical history and problem list. Problem list updated.   Objective  Blood pressure 130/90, weight 246 lb (111.6 kg), last menstrual period 01/07/2017. Pregravid weight 268 lb (121.6 kg) Total Weight Gain -22 lb (-9.979 kg) Urinalysis:      Fetal Status: Fetal Heart Rate (bpm): 148   Movement: Present     General:  Alert, oriented and cooperative. Patient is in no acute distress.  Skin: Skin is warm and dry. No rash noted.   Cardiovascular: Normal heart rate noted  Respiratory: Normal respiratory effort, no problems with respiration noted  Abdomen: Soft, gravid, appropriate for gestational age. Pain/Pressure: Present     Pelvic:  Cervical exam deferred        Extremities: Normal range of motion.     Mental Status: Normal mood and affect. Normal behavior. Normal judgment and thought content.     Assessment   27 y.o. G3P1011 at [redacted]w[redacted]d, EDD  10/14/2017, by Last Menstrual Period presenting for work-in prenatal visit.  Plan   pregnancy Problems (from 02/16/17 to 06/26/17)    Problem Noted Resolved   Nausea and vomiting in pregnancy 03/05/2017 by Oswaldo Conroy, CNM No   Supervision of high risk pregnancy, antepartum, first trimester 02/26/2017 by Oswaldo Conroy, CNM No   Overview Addendum 04/04/2017  8:28 PM by Natale Milch, MD    [facility]inic [facility] Prenatal Labs  Dating  LMP=7w [place] Blood type: A Pos  Genetic Screen 1 Screen:    AFP:     Quad:     NIPS: Antibody:   Anatomic [place]  Rubella: Immune Varicella: Immune  GTT Early:  3             Third trimester:  RPR: NR  Rhogam  HBsAg: Negative  TDaP vaccine                       Flu Shot: HIV: NR  Baby Food                                GBS:   Contraception  Pap: NILM 10/2016  CBB     CS/VBAC    Support Person        BMI >=40 Arly.Keller ] early 1h gtt -  Arly.Keller ] u/s for dating [ ]   [ ]  nutritional goals [ ]  folic acid 1mg  Arly.Keller ] bASA (>12 weeks) [ ]  consider nutrition consult [ ]  consider maternal EKG 1st trimester [ ]  Growth u/s 28 [ ] , 32 [ ] , 36 weeks [ ]  [ ]  NST/AFI weekly 36+ weeks (36[] , 37[] , 38[] , 39[] , 40[] ) [ ]  IOL by  41 weeks (scheduled, prn [] )       Maternal obesity affecting pregnancy, antepartum 02/26/2017 by Oswaldo ConroySchmid, Jacelyn Y, CNM No   BMI 40.0-44.9, adult (HCC) 02/26/2017 by Oswaldo ConroySchmid, Jacelyn Y, CNM No   Placenta previa antepartum in second trimester 04/23/2017 by Natale MilchSchuman, Christanna R, MD 05/29/2017 by Farrel ConnersGutierrez, Colleen, CNM   Overview Addendum 04/23/2017  9:50 AM by Natale MilchSchuman, Christanna R, MD    Low lying/marginal placenta on ER US 04/21/17 [ ]  Will need follow up at anatomy US 18-20 weeks      First trimester bleeding 02/16/2017 by Conard NovakJackson, Stephen D, MD 06/26/2017 by Vena AustriaStaebler, Andreas, MD      H&P consistent with gas pains. Discussed comfort measures for sciatica and gas. Reviewed diet/ambulation/stool softeners for constipation. Rx sent for simethicone  and docusate.  Gestational age appropriate labor symptoms and general obstetric precautions were reviewed with the patient.  Keep scheduled ROB.  Marcelyn BruinsJacelyn Schmid, CNM 07/15/2017

## 2017-07-21 ENCOUNTER — Encounter: Payer: Self-pay | Admitting: Maternal Newborn

## 2017-07-24 ENCOUNTER — Ambulatory Visit (INDEPENDENT_AMBULATORY_CARE_PROVIDER_SITE_OTHER): Payer: BLUE CROSS/BLUE SHIELD | Admitting: Advanced Practice Midwife

## 2017-07-24 ENCOUNTER — Other Ambulatory Visit: Payer: BLUE CROSS/BLUE SHIELD

## 2017-07-24 ENCOUNTER — Encounter: Payer: Self-pay | Admitting: Advanced Practice Midwife

## 2017-07-24 ENCOUNTER — Ambulatory Visit (INDEPENDENT_AMBULATORY_CARE_PROVIDER_SITE_OTHER): Payer: BLUE CROSS/BLUE SHIELD

## 2017-07-24 VITALS — BP 112/78 | Wt 248.0 lb

## 2017-07-24 DIAGNOSIS — O0991 Supervision of high risk pregnancy, unspecified, first trimester: Secondary | ICD-10-CM

## 2017-07-24 DIAGNOSIS — O219 Vomiting of pregnancy, unspecified: Secondary | ICD-10-CM

## 2017-07-24 DIAGNOSIS — O99210 Obesity complicating pregnancy, unspecified trimester: Secondary | ICD-10-CM

## 2017-07-24 DIAGNOSIS — Z3A28 28 weeks gestation of pregnancy: Secondary | ICD-10-CM

## 2017-07-24 DIAGNOSIS — Z6841 Body Mass Index (BMI) 40.0 and over, adult: Secondary | ICD-10-CM

## 2017-07-24 NOTE — Progress Notes (Signed)
Growth Scan today Pt has no concerns

## 2017-07-24 NOTE — Patient Instructions (Signed)
Third Trimester of Pregnancy The third trimester is from week 28 through week 40 (months 7 through 9). The third trimester is a time when the unborn baby (fetus) is growing rapidly. At the end of the ninth month, the fetus is about 20 inches in length and weighs 6-10 pounds. Body changes during your third trimester Your body will continue to go through many changes during pregnancy. The changes vary from woman to woman. During the third trimester:  Your weight will continue to increase. You can expect to gain 25-35 pounds (11-16 kg) by the end of the pregnancy.  You may begin to get stretch marks on your hips, abdomen, and breasts.  You may urinate more often because the fetus is moving lower into your pelvis and pressing on your bladder.  You may develop or continue to have heartburn. This is caused by increased hormones that slow down muscles in the digestive tract.  You may develop or continue to have constipation because increased hormones slow digestion and cause the muscles that push waste through your intestines to relax.  You may develop hemorrhoids. These are swollen veins (varicose veins) in the rectum that can itch or be painful.  You may develop swollen, bulging veins (varicose veins) in your legs.  You may have increased body aches in the pelvis, back, or thighs. This is due to weight gain and increased hormones that are relaxing your joints.  You may have changes in your hair. These can include thickening of your hair, rapid growth, and changes in texture. Some women also have hair loss during or after pregnancy, or hair that feels dry or thin. Your hair will most likely return to normal after your baby is born.  Your breasts will continue to grow and they will continue to become tender. A yellow fluid (colostrum) may leak from your breasts. This is the first milk you are producing for your baby.  Your belly button may stick out.  You may notice more swelling in your hands,  face, or ankles.  You may have increased tingling or numbness in your hands, arms, and legs. The skin on your belly may also feel numb.  You may feel short of breath because of your expanding uterus.  You may have more problems sleeping. This can be caused by the size of your belly, increased need to urinate, and an increase in your body's metabolism.  You may notice the fetus "dropping," or moving lower in your abdomen (lightening).  You may have increased vaginal discharge.  You may notice your joints feel loose and you may have pain around your pelvic bone.  What to expect at prenatal visits You will have prenatal exams every 2 weeks until week 36. Then you will have weekly prenatal exams. During a routine prenatal visit:  You will be weighed to make sure you and the baby are growing normally.  Your blood pressure will be taken.  Your abdomen will be measured to track your baby's growth.  The fetal heartbeat will be listened to.  Any test results from the previous visit will be discussed.  You may have a cervical check near your due date to see if your cervix has softened or thinned (effaced).  You will be tested for Group B streptococcus. This happens between 35 and 37 weeks.  Your health care provider may ask you:  What your birth plan is.  How you are feeling.  If you are feeling the baby move.  If you have had   any abnormal symptoms, such as leaking fluid, bleeding, severe headaches, or abdominal cramping.  If you are using any tobacco products, including cigarettes, chewing tobacco, and electronic cigarettes.  If you have any questions.  Other tests or screenings that may be performed during your third trimester include:  Blood tests that check for low iron levels (anemia).  Fetal testing to check the health, activity level, and growth of the fetus. Testing is done if you have certain medical conditions or if there are problems during the  pregnancy.  Nonstress test (NST). This test checks the health of your baby to make sure there are no signs of problems, such as the baby not getting enough oxygen. During this test, a belt is placed around your belly. The baby is made to move, and its heart rate is monitored during movement.  What is false labor? False labor is a condition in which you feel small, irregular tightenings of the muscles in the womb (contractions) that usually go away with rest, changing position, or drinking water. These are called Braxton Hicks contractions. Contractions may last for hours, days, or even weeks before true labor sets in. If contractions come at regular intervals, become more frequent, increase in intensity, or become painful, you should see your health care provider. What are the signs of labor?  Abdominal cramps.  Regular contractions that start at 10 minutes apart and become stronger and more frequent with time.  Contractions that start on the top of the uterus and spread down to the lower abdomen and back.  Increased pelvic pressure and dull back pain.  A watery or bloody mucus discharge that comes from the vagina.  Leaking of amniotic fluid. This is also known as your "water breaking." It could be a slow trickle or a gush. Let your health care provider know if it has a color or strange odor. If you have any of these signs, call your health care provider right away, even if it is before your due date. Follow these instructions at home: Medicines  Follow your health care provider's instructions regarding medicine use. Specific medicines may be either safe or unsafe to take during pregnancy.  Take a prenatal vitamin that contains at least 600 micrograms (mcg) of folic acid.  If you develop constipation, try taking a stool softener if your health care provider approves. Eating and drinking  Eat a balanced diet that includes fresh fruits and vegetables, whole grains, good sources of protein  such as meat, eggs, or tofu, and low-fat dairy. Your health care provider will help you determine the amount of weight gain that is right for you.  Avoid raw meat and uncooked cheese. These carry germs that can cause birth defects in the baby.  If you have low calcium intake from food, talk to your health care provider about whether you should take a daily calcium supplement.  Eat four or five small meals rather than three large meals a day.  Limit foods that are high in fat and processed sugars, such as fried and sweet foods.  To prevent constipation: ? Drink enough fluid to keep your urine clear or pale yellow. ? Eat foods that are high in fiber, such as fresh fruits and vegetables, whole grains, and beans. Activity  Exercise only as directed by your health care provider. Most women can continue their usual exercise routine during pregnancy. Try to exercise for 30 minutes at least 5 days a week. Stop exercising if you experience uterine contractions.  Avoid heavy   lifting.  Do not exercise in extreme heat or humidity, or at high altitudes.  Wear low-heel, comfortable shoes.  Practice good posture.  You may continue to have sex unless your health care provider tells you otherwise. Relieving pain and discomfort  Take frequent breaks and rest with your legs elevated if you have leg cramps or low back pain.  Take warm sitz baths to soothe any pain or discomfort caused by hemorrhoids. Use hemorrhoid cream if your health care provider approves.  Wear a good support bra to prevent discomfort from breast tenderness.  If you develop varicose veins: ? Wear support pantyhose or compression stockings as told by your healthcare provider. ? Elevate your feet for 15 minutes, 3-4 times a day. Prenatal care  Write down your questions. Take them to your prenatal visits.  Keep all your prenatal visits as told by your health care provider. This is important. Safety  Wear your seat belt at  all times when driving.  Make a list of emergency phone numbers, including numbers for family, friends, the hospital, and police and fire departments. General instructions  Avoid cat litter boxes and soil used by cats. These carry germs that can cause birth defects in the baby. If you have a cat, ask someone to clean the litter box for you.  Do not travel far distances unless it is absolutely necessary and only with the approval of your health care provider.  Do not use hot tubs, steam rooms, or saunas.  Do not drink alcohol.  Do not use any products that contain nicotine or tobacco, such as cigarettes and e-cigarettes. If you need help quitting, ask your health care provider.  Do not use any medicinal herbs or unprescribed drugs. These chemicals affect the formation and growth of the baby.  Do not douche or use tampons or scented sanitary pads.  Do not cross your legs for long periods of time.  To prepare for the arrival of your baby: ? Take prenatal classes to understand, practice, and ask questions about labor and delivery. ? Make a trial run to the hospital. ? Visit the hospital and tour the maternity area. ? Arrange for maternity or paternity leave through employers. ? Arrange for family and friends to take care of pets while you are in the hospital. ? Purchase a rear-facing car seat and make sure you know how to install it in your car. ? Pack your hospital bag. ? Prepare the baby's nursery. Make sure to remove all pillows and stuffed animals from the baby's crib to prevent suffocation.  Visit your dentist if you have not gone during your pregnancy. Use a soft toothbrush to brush your teeth and be gentle when you floss. Contact a health care provider if:  You are unsure if you are in labor or if your water has broken.  You become dizzy.  You have mild pelvic cramps, pelvic pressure, or nagging pain in your abdominal area.  You have lower back pain.  You have persistent  nausea, vomiting, or diarrhea.  You have an unusual or bad smelling vaginal discharge.  You have pain when you urinate. Get help right away if:  Your water breaks before 37 weeks.  You have regular contractions less than 5 minutes apart before 37 weeks.  You have a fever.  You are leaking fluid from your vagina.  You have spotting or bleeding from your vagina.  You have severe abdominal pain or cramping.  You have rapid weight loss or weight gain.    You have shortness of breath with chest pain.  You notice sudden or extreme swelling of your face, hands, ankles, feet, or legs.  Your baby makes fewer than 10 movements in 2 hours.  You have severe headaches that do not go away when you take medicine.  You have vision changes. Summary  The third trimester is from week 28 through week 40, months 7 through 9. The third trimester is a time when the unborn baby (fetus) is growing rapidly.  During the third trimester, your discomfort may increase as you and your baby continue to gain weight. You may have abdominal, leg, and back pain, sleeping problems, and an increased need to urinate.  During the third trimester your breasts will keep growing and they will continue to become tender. A yellow fluid (colostrum) may leak from your breasts. This is the first milk you are producing for your baby.  False labor is a condition in which you feel small, irregular tightenings of the muscles in the womb (contractions) that eventually go away. These are called Braxton Hicks contractions. Contractions may last for hours, days, or even weeks before true labor sets in.  Signs of labor can include: abdominal cramps; regular contractions that start at 10 minutes apart and become stronger and more frequent with time; watery or bloody mucus discharge that comes from the vagina; increased pelvic pressure and dull back pain; and leaking of amniotic fluid. This information is not intended to replace advice  given to you by your health care provider. Make sure you discuss any questions you have with your health care provider. Document Released: 04/08/2001 Document Revised: 09/20/2015 Document Reviewed: 06/15/2012 Elsevier Interactive Patient Education  2017 [employer]  

## 2017-07-24 NOTE — Progress Notes (Signed)
Routine Prenatal Care Visit  Subjective  Shelby Obrien is a 27 y.o. G3P1011 at [redacted]w[redacted]d being seen today for ongoing prenatal care.  She is currently monitored for the following issues for this high-risk pregnancy and has Migraines; Asthma; Supervision of high risk pregnancy, antepartum, first trimester; Maternal obesity affecting pregnancy, antepartum; BMI 40.0-44.9, adult (HCC); and Nausea and vomiting in pregnancy on their problem list.  ----------------------------------------------------------------------------------- Patient reports no complaints.   Contractions: Not present. Vag. Bleeding: None.  Movement: Present. Denies leaking of fluid.  ----------------------------------------------------------------------------------- The following portions of the patient's history were reviewed and updated as appropriate: allergies, current medications, past family history, past medical history, past social history, past surgical history and problem list. Problem list updated.   Objective  Blood pressure 112/78, weight 248 lb (112.5 kg), last menstrual period 01/07/2017 Pregravid weight 268 lb (121.6 kg) Total Weight Gain -20 lb (-9.072 kg) Urinalysis: Urine Protein: Trace Urine Glucose: Negative  Fetal Status: Fetal Heart Rate (bpm): 146 Fundal Height: 29 cm Movement: Present     Growth scan today: 54.4%, 2 pounds 11 ounces AFI 15.08 cm  General:  Alert, oriented and cooperative. Patient is in no acute distress.  Skin: Skin is warm and dry. No rash noted.   Cardiovascular: Normal heart rate noted  Respiratory: Normal respiratory effort, no problems with respiration noted  Abdomen: Soft, gravid, appropriate for gestational age. Pain/Pressure: Absent     Pelvic:  Cervical exam deferred        Extremities: Normal range of motion.     Mental Status: Normal mood and affect. Normal behavior. Normal judgment and thought content.   Assessment   27 y.o. G3P1011 at [redacted]w[redacted]d by  10/14/2017, by  Last Menstrual Period presenting for routine prenatal visit  Plan   pregnancy Problems (from 02/16/17 to present)    Problem Noted Resolved   Nausea and vomiting in pregnancy 03/05/2017 by Oswaldo Conroy, CNM No   Supervision of high risk pregnancy, antepartum, first trimester 02/26/2017 by Oswaldo Conroy, CNM No   Overview Addendum 04/04/2017  8:28 PM by Natale Milch, MD    Clinic [facility] Prenatal Labs  Dating  LMP=7w [place] Blood type: A Pos  Genetic Screen 1 Screen:    AFP:     Quad:     NIPS: Antibody:   Anatomic [place]  Rubella: Immune Varicella: Immune  GTT Early:  74             Third trimester:  RPR: NR  Rhogam  HBsAg: Negative  TDaP vaccine                       Flu Shot: HIV: NR  Baby Food                                GBS:   Contraception  Pap: NILM 10/2016  CBB     CS/VBAC    Support Person        BMI >=40 Arly.Keller ] early 1h gtt -  Arly.Keller ] u/s for dating [ ]   [ ]  nutritional goals [ ]  folic acid 1mg  Arly.Keller ] bASA (>12 weeks) [ ]  consider nutrition consult [ ]  consider maternal EKG 1st trimester [ ]  Growth u/s 28 [x] , 32 [ ] , 36 weeks [ ]  [ ]  NST/AFI weekly 36+ weeks (36[] , 37[] , 38[] , 39[] , 40[] ) [ ]  IOL by 41 weeks (scheduled, prn [] )  Maternal obesity affecting pregnancy, antepartum 02/26/2017 by Oswaldo ConroySchmid, Jacelyn Y, CNM No   BMI 40.0-44.9, adult (HCC) 02/26/2017 by Oswaldo ConroySchmid, Jacelyn Y, CNM No   Placenta previa antepartum in second trimester 04/23/2017 by Natale MilchSchuman, Christanna R, MD 05/29/2017 by Farrel ConnersGutierrez, Colleen, CNM   Overview Addendum 04/23/2017  9:50 AM by Natale MilchSchuman, Christanna R, MD    Low lying/marginal placenta on ER US 04/21/17 [ ]  Will need follow up at anatomy US 18-20 weeks      First trimester bleeding 02/16/2017 by Conard NovakJackson, Stephen D, MD 06/26/2017 by Vena AustriaStaebler, Andreas, MD       Preterm labor symptoms and general obstetric precautions including but not limited to vaginal bleeding, contractions, leaking of fluid and fetal movement were reviewed in detail  with the patient. Please refer to After Visit Summary for other counseling recommendations.   Return in about 2 weeks (around 08/07/2017) for rob.  Tresea MallJane Jhordyn Hoopingarner, CNM 07/24/2017 9:37 AM

## 2017-07-25 LAB — 28 WEEK RH+PANEL
BASOS ABS: 0 10*3/uL (ref 0.0–0.2)
Basos: 0 %
EOS (ABSOLUTE): 0.1 10*3/uL (ref 0.0–0.4)
EOS: 1 %
Gestational Diabetes Screen: 101 mg/dL (ref 65–139)
HIV SCREEN 4TH GENERATION: NONREACTIVE
Hematocrit: 31 % — ABNORMAL LOW (ref 34.0–46.6)
Hemoglobin: 10.4 g/dL — ABNORMAL LOW (ref 11.1–15.9)
IMMATURE GRANS (ABS): 0 10*3/uL (ref 0.0–0.1)
IMMATURE GRANULOCYTES: 0 %
LYMPHS: 18 %
Lymphocytes Absolute: 1.2 10*3/uL (ref 0.7–3.1)
MCH: 29.7 pg (ref 26.6–33.0)
MCHC: 33.5 g/dL (ref 31.5–35.7)
MCV: 89 fL (ref 79–97)
MONOCYTES: 5 %
Monocytes Absolute: 0.3 10*3/uL (ref 0.1–0.9)
NEUTROS PCT: 76 %
Neutrophils Absolute: 4.8 10*3/uL (ref 1.4–7.0)
PLATELETS: 190 10*3/uL (ref 150–379)
RBC: 3.5 x10E6/uL — ABNORMAL LOW (ref 3.77–5.28)
RDW: 14.7 % (ref 12.3–15.4)
RPR Ser Ql: NONREACTIVE
WBC: 6.4 10*3/uL (ref 3.4–10.8)

## 2017-07-28 ENCOUNTER — Encounter (INDEPENDENT_AMBULATORY_CARE_PROVIDER_SITE_OTHER): Payer: Self-pay

## 2017-07-28 ENCOUNTER — Other Ambulatory Visit: Payer: Self-pay | Admitting: Obstetrics and Gynecology

## 2017-07-28 MED ORDER — FERROUS SULFATE 325 (65 FE) MG PO TABS: 325.0000 mg | ORAL_TABLET | Freq: Every day | ORAL | 3 refills | Status: DC

## 2017-07-31 ENCOUNTER — Ambulatory Visit (INDEPENDENT_AMBULATORY_CARE_PROVIDER_SITE_OTHER): Payer: BLUE CROSS/BLUE SHIELD | Admitting: Obstetrics and Gynecology

## 2017-07-31 ENCOUNTER — Telehealth: Payer: Self-pay

## 2017-07-31 ENCOUNTER — Encounter: Payer: Self-pay | Admitting: Obstetrics and Gynecology

## 2017-07-31 VITALS — BP 116/72 | Wt 249.0 lb

## 2017-07-31 DIAGNOSIS — F419 Anxiety disorder, unspecified: Secondary | ICD-10-CM

## 2017-07-31 DIAGNOSIS — Z3A29 29 weeks gestation of pregnancy: Secondary | ICD-10-CM

## 2017-07-31 DIAGNOSIS — Z6841 Body Mass Index (BMI) 40.0 and over, adult: Secondary | ICD-10-CM

## 2017-07-31 DIAGNOSIS — O0991 Supervision of high risk pregnancy, unspecified, first trimester: Secondary | ICD-10-CM

## 2017-07-31 DIAGNOSIS — O99340 Other mental disorders complicating pregnancy, unspecified trimester: Secondary | ICD-10-CM

## 2017-07-31 DIAGNOSIS — O99210 Obesity complicating pregnancy, unspecified trimester: Secondary | ICD-10-CM

## 2017-07-31 MED ORDER — ALPRAZOLAM 0.5 MG PO TABS: 0.5000 mg | ORAL_TABLET | Freq: Two times a day (BID) | ORAL | 0 refills | Status: DC | PRN

## 2017-07-31 NOTE — Telephone Encounter (Signed)
Pt called triage stating she keeps feeling like her heart is racing or that she is having panic attacks x2 weeks, she states they are getting worse though. Called pt back and scheduled her an appt at 1:30 today.

## 2017-07-31 NOTE — Progress Notes (Addendum)
ROB Panic attacks (Edinburgh score 12) headaches

## 2017-07-31 NOTE — Progress Notes (Signed)
Routine Prenatal Care Visit  Subjective  Shelby Obrien is a 27 y.o. G3P1011 at 2239w2d being seen today for ongoing prenatal care.  She is currently monitored for the following issues for this high-risk pregnancy and has Migraines; Asthma; Supervision of high risk pregnancy, antepartum, first trimester; Maternal obesity affecting pregnancy, antepartum; BMI 40.0-44.9, adult (HCC); and Nausea and vomiting in pregnancy on their problem list.  ----------------------------------------------------------------------------------- Patient reports that she has been feeling anxious. Her edinburgh depression score was 12. The patient is working, but reduced hours. She is having conflicts with the FOB and is tearful when she mentions the situation. She does not want to discuss it further. Difficulty sleeping and waking up at night with her heart racing. New in the last 12 weeks.  Contractions: Not present. Vag. Bleeding: None.  Movement: Present. Denies leaking of fluid.  ----------------------------------------------------------------------------------- The following portions of the patient's history were reviewed and updated as appropriate: allergies, current medications, past family history, past medical history, past social history, past surgical history and problem list. Problem list updated.   Objective  Blood pressure 116/72, weight 249 lb (112.9 kg), last menstrual period 01/07/2017, unknown if currently breastfeeding. Pregravid weight 268 lb (121.6 kg) Total Weight Gain -19 lb (-8.618 kg) Urinalysis:      Fetal Status:     Movement: Present    Patient declined listening to fetal heart tones and measurement of fundal height.   General:  Alert, oriented and cooperative. Patient is in no acute distress.  Skin: Skin is warm and dry. No rash noted.   Cardiovascular: Normal heart rate noted  Respiratory: Normal respiratory effort, no problems with respiration noted  Abdomen: Soft, gravid,  appropriate for gestational age. Pain/Pressure: Absent     Pelvic:  Cervical exam deferred        Extremities: Normal range of motion.     ental Status: Normal mood and affect. Normal behavior. Normal judgment and thought content.     Assessment   27 y.o. G3P1011 at 1939w2d by  10/14/2017, by Last Menstrual Period presenting for work-in prenatal visit  Plan   pregnancy Problems (from 02/16/17 to present)    Problem Noted Resolved   Nausea and vomiting in pregnancy 03/05/2017 by Oswaldo ConroySchmid, Jacelyn Y, CNM No   Supervision of high risk pregnancy, antepartum, first trimester 02/26/2017 by Oswaldo ConroySchmid, Jacelyn Y, CNM No   Overview Addendum 07/28/2017 11:42 AM by Vena AustriaStaebler, Andreas, MD    [facility]inic [facility] Prenatal Labs  Dating  LMP=7w US Blood type: A Pos  Genetic Screen 1 Screen:    AFP:     Quad:     NIPS: Antibody:   Anatomic US  Rubella: Immune Varicella: Immune  GTT Early:  3486             Third trimester: 101 RPR: NR  Rhogam  HBsAg: Negative  TDaP vaccine                       Flu Shot: HIV: NR  Baby Food                                GBS:   Contraception  Pap: NILM 10/2016  CBB     CS/VBAC    Support Person        BMI >=40 Shelby Obrien[X ] early 1h gtt -  Shelby Obrien[X ] u/s for dating [ ]   [ ]  nutritional goals [ ]   folic acid 1mg  Shelby Obrien ] bASA (>12 weeks) [ ]  consider nutrition consult [ ]  consider maternal EKG 1st trimester [ ]  Growth u/s 28 [ ] , 32 [ ] , 36 weeks [ ]  [ ]  NST/AFI weekly 36+ weeks (36[] , 37[] , 38[] , 39[] , 40[] ) [ ]  IOL by 41 weeks (scheduled, prn [] )       Maternal obesity affecting pregnancy, antepartum 02/26/2017 by Oswaldo Conroy, CNM No   BMI 40.0-44.9, adult (HCC) 02/26/2017 by Oswaldo Conroy, CNM No   Placenta previa antepartum in second trimester 04/23/2017 by Natale Milch, MD 05/29/2017 by Farrel Conners, CNM   Overview Addendum 04/23/2017  9:50 AM by Natale Milch, MD    Low lying/marginal placenta on ER [place] 04/21/17 [ ]  Will need follow up at anatomy US 18-20  weeks      First trimester bleeding 02/16/2017 by Conard Novak, MD 06/26/2017 by Vena Austria, MD       Gestational age appropriate obstetric precautions including but not limited to vaginal bleeding, contractions, leaking of fluid and fetal movement were reviewed in detail with the patient.    Dorena Dew saw and spoke with the patient in the office. She will help with connecting the patient with resources and a counselor.  Discussed the importance of counseling with the patient. Given a brochure with resources for depression during pregnancy through [facility].  Patient given a prescription for 10 xanax to uses as needed for panic attacks. Patient declined an antidepressant at this time.   Return for ROB scheduled for next week.  Adelene Idler MD [facility], [facility] 07/31/2017, 2:10 PM

## 2017-08-07 ENCOUNTER — Ambulatory Visit (INDEPENDENT_AMBULATORY_CARE_PROVIDER_SITE_OTHER): Payer: BLUE CROSS/BLUE SHIELD | Admitting: Maternal Newborn

## 2017-08-07 ENCOUNTER — Encounter: Payer: Self-pay | Admitting: Maternal Newborn

## 2017-08-07 VITALS — BP 130/74 | Wt 251.0 lb

## 2017-08-07 DIAGNOSIS — Z3A30 30 weeks gestation of pregnancy: Secondary | ICD-10-CM

## 2017-08-07 DIAGNOSIS — Z23 Encounter for immunization: Secondary | ICD-10-CM

## 2017-08-07 DIAGNOSIS — Z3689 Encounter for other specified antenatal screening: Secondary | ICD-10-CM

## 2017-08-07 DIAGNOSIS — O0991 Supervision of high risk pregnancy, unspecified, first trimester: Secondary | ICD-10-CM

## 2017-08-07 MED ORDER — TETANUS-DIPHTH-ACELL PERTUSSIS 5-2.5-18.5 LF-MCG/0.5 IM SUSP
0.5000 mL | Freq: Once | INTRAMUSCULAR | Status: AC
  Administered 2017-08-07: 0.5 mL via INTRAMUSCULAR

## 2017-08-07 NOTE — Progress Notes (Signed)
Routine Prenatal Care Visit  Subjective  Shelby Obrien is a 27 y.o. G3P1011 at [redacted]w[redacted]d being seen today for ongoing prenatal care.  She is currently monitored for the following issues for this high-risk pregnancy and has Migraines; Asthma; Supervision of high risk pregnancy, antepartum, first trimester; Maternal obesity affecting pregnancy, antepartum; BMI 40.0-44.9, adult (HCC); and Nausea and vomiting in pregnancy on their problem list.  ----------------------------------------------------------------------------------- Patient reports occasional pain in the areas of her pubic bone and some leg cramps at night.   Contractions: Not present. Vag. Bleeding: None.  Movement: Present. Denies leaking of fluid.  ----------------------------------------------------------------------------------- The following portions of the patient's history were reviewed and updated as appropriate: allergies, current medications, past family history, past medical history, past social history, past surgical history and problem list. Problem list updated.   Objective   Vitals:   08/07/17 0822  BP: 130/74   Pregravid weight 268 lb (121.6 kg) Total Weight Gain -17 lb (-7.711 kg) Urinalysis: Urine Protein: Trace Urine Glucose: Negative  Fetal Status: Fetal Heart Rate (bpm): 142 Fundal Height: 33 cm Movement: Present     General:  Alert, oriented and cooperative. Patient is in no acute distress.  Skin: Skin is warm and dry. No rash noted.   Cardiovascular: Normal heart rate noted  Respiratory: Normal respiratory effort, no problems with respiration noted  Abdomen: Soft, gravid, appropriate for gestational age. Pain/Pressure: Absent     Pelvic:  Cervical exam deferred        Extremities: Normal range of motion.     Mental Status: Normal mood and affect. Normal behavior. Normal judgment and thought content.     Assessment   27 y.o. G3P1011 at [redacted]w[redacted]d, EDD 10/14/2017 by Last Menstrual Period  presenting for routine prenatal visit.  Plan   pregnancy Problems (from 02/16/17 to present)    Problem Noted Resolved   Nausea and vomiting in pregnancy 03/05/2017 by Oswaldo Conroy, CNM No   Supervision of high risk pregnancy, antepartum, first trimester 02/26/2017 by Oswaldo Conroy, CNM No   Overview Addendum 07/28/2017 11:42 AM by Vena Austria, MD    [facility]inic [facility] Prenatal Labs  Dating  LMP=7w [place] Blood type: A Pos  Genetic Screen 1 Screen:    AFP:     Quad:     NIPS: Antibody:   Anatomic [place]  Rubella: Immune Varicella: Immune  GTT Early:  54             Third trimester: 101 RPR: NR  Rhogam  HBsAg: Negative  TDaP vaccine                       Flu Shot: HIV: NR  Baby Food                                GBS:   Contraception  Pap: NILM 10/2016  CBB     CS/VBAC    Support Person        BMI >=40 Arly.Keller ] early 1h gtt -  Arly.Keller ] u/s for dating [ ]   [ ]  nutritional goals [ ]  folic acid 1mg  Arly.Keller ] bASA (>12 weeks) [ ]  consider nutrition consult [ ]  consider maternal EKG 1st trimester [ ]  Growth u/s 28 [ ] , 32 [ ] , 36 weeks [ ]  [ ]  NST/AFI weekly 36+ weeks (36[] , 37[] , 38[] , 39[] , 40[] ) [ ]  IOL by 41 weeks (scheduled, prn [] )  Maternal obesity affecting pregnancy, antepartum 02/26/2017 by Oswaldo ConroySchmid, Jacelyn Y, CNM No   BMI 40.0-44.9, adult (HCC) 02/26/2017 by Oswaldo ConroySchmid, Jacelyn Y, CNM No   Placenta previa antepartum in second trimester 04/23/2017 by Natale MilchSchuman, Christanna R, MD 05/29/2017 by Farrel ConnersGutierrez, Colleen, CNM   Overview Addendum 04/23/2017  9:50 AM by Natale MilchSchuman, Christanna R, MD    Low lying/marginal placenta on ER US 04/21/17 [ ]  Will need follow up at anatomy US 18-20 weeks      First trimester bleeding 02/16/2017 by Conard NovakJackson, Stephen D, MD 06/26/2017 by Vena AustriaStaebler, Andreas, MD    TDaP today. Discussed support band to relieve intermittent pubic pain and magnesium for leg cramps.  Preterm labor symptoms and general obstetric precautions including but not limited to vaginal bleeding,  contractions, leaking of fluid and fetal movement were reviewed with the patient.  Return in about 2 weeks (around 08/21/2017) for ROB and growth ultrasound.  Marcelyn BruinsJacelyn Schmid, CNM 08/07/2017  9:01 AM

## 2017-08-07 NOTE — Progress Notes (Signed)
C/o crotch area hurts for about every am when she gets up.  Tdap today and blood tx signed. rj

## 2017-08-20 ENCOUNTER — Encounter: Payer: Self-pay | Admitting: Neurology

## 2017-08-21 ENCOUNTER — Encounter: Payer: Self-pay | Admitting: Advanced Practice Midwife

## 2017-08-21 ENCOUNTER — Ambulatory Visit (INDEPENDENT_AMBULATORY_CARE_PROVIDER_SITE_OTHER): Payer: BLUE CROSS/BLUE SHIELD

## 2017-08-21 ENCOUNTER — Ambulatory Visit (INDEPENDENT_AMBULATORY_CARE_PROVIDER_SITE_OTHER): Payer: BLUE CROSS/BLUE SHIELD | Admitting: Advanced Practice Midwife

## 2017-08-21 VITALS — BP 118/68 | Wt 249.0 lb

## 2017-08-21 DIAGNOSIS — Z3689 Encounter for other specified antenatal screening: Secondary | ICD-10-CM | POA: Diagnosis not present

## 2017-08-21 DIAGNOSIS — Z3A32 32 weeks gestation of pregnancy: Secondary | ICD-10-CM

## 2017-08-21 NOTE — Progress Notes (Signed)
U/S today. C/o itching head to toe at night x3 days.

## 2017-08-21 NOTE — Progress Notes (Signed)
Routine Prenatal Care Visit  Subjective  Shelby Obrien is a 27 y.o. G3P1011 at [redacted]w[redacted]d being seen today for ongoing prenatal care.  She is currently monitored for the following issues for this high-risk pregnancy and has Migraines; Asthma; Supervision of high risk pregnancy, antepartum, first trimester; Maternal obesity affecting pregnancy, antepartum; BMI 40.0-44.9, adult (HCC); and Nausea and vomiting in pregnancy on their problem list.  ----------------------------------------------------------------------------------- Patient reports generalized itching for the past few evenings from about 8 pm to midnight. She takes a bath and itching continues and then takes a second bath with some relief. She denies rash or contact dermatitis or other allergic reaction. She also complains of sciatic pain. Comfort measures discussed for both.   Contractions: Not present. Vag. Bleeding: None.  Movement: Present. Denies leaking of fluid.  ----------------------------------------------------------------------------------- The following portions of the patient's history were reviewed and updated as appropriate: allergies, current medications, past family history, past medical history, past social history, past surgical history and problem list. Problem list updated.   Objective  Blood pressure 118/68, weight 249 lb (112.9 kg), last menstrual period 01/07/2017 Pregravid weight 268 lb (121.6 kg) Total Weight Gain -19 lb (-8.618 kg) Urinalysis: Urine Protein: Negative Urine Glucose: Negative  Fetal Status: Fetal Heart Rate (bpm): 142   Movement: Present     Growth scan today: 4 pounds 3 ounces, 44%, AFI 10.9  General:  Alert, oriented and cooperative. Patient is in no acute distress.  Skin: Skin is warm and dry. No rash noted.   Cardiovascular: Normal heart rate noted  Respiratory: Normal respiratory effort, no problems with respiration noted  Abdomen: Soft, gravid, appropriate for gestational age.  Pain/Pressure: Present     Pelvic:  Cervical exam deferred        Extremities: Normal range of motion.     Mental Status: Normal mood and affect. Normal behavior. Normal judgment and thought content.   Assessment   27 y.o. G3P1011 at [redacted]w[redacted]d by  10/14/2017, by Last Menstrual Period presenting for routine prenatal visit  Plan   pregnancy Problems (from 02/16/17 to present)    Problem Noted Resolved   Nausea and vomiting in pregnancy 03/05/2017 by Oswaldo Conroy, CNM No   Supervision of high risk pregnancy, antepartum, first trimester 02/26/2017 by Oswaldo Conroy, CNM No   Overview Addendum 07/28/2017 11:42 AM by Vena Austria, MD    [facility] Prenatal Labs  Dating  LMP=7w [place] Blood type: A Pos  Genetic Screen 1 Screen:    AFP:     Quad:     NIPS: Antibody:   Anatomic [place]  Rubella: Immune Varicella: Immune  GTT Early:  20             Third trimester: 101 RPR: NR  Rhogam  HBsAg: Negative  TDaP vaccine                       Flu Shot: HIV: NR  Baby Food                                GBS:   Contraception  Pap: NILM 10/2016  CBB     CS/VBAC    Support Person        BMI >=40 Arly.Keller ] early 1h gtt -  Arly.Keller ] u/s for dating [ ]   [ ]  nutritional goals [ ]  folic acid 1mg  Arly.Keller ] bASA (>12 weeks) [ ]  consider  nutrition consult [ ]  consider maternal EKG 1st trimester [ ]  Growth u/s 28 [ ] , 32 [x] , 36 weeks [ ]  [ ]  NST/AFI weekly 36+ weeks (36[] , 37[] , 38[] , 39[] , 40[] ) [ ]  IOL by 41 weeks (scheduled, prn [] )       Maternal obesity affecting pregnancy, antepartum 02/26/2017 by Oswaldo ConroySchmid, Jacelyn Y, CNM No   BMI 40.0-44.9, adult (HCC) 02/26/2017 by Oswaldo ConroySchmid, Jacelyn Y, CNM No   Placenta previa antepartum in second trimester 04/23/2017 by Natale MilchSchuman, Christanna R, MD 05/29/2017 by Farrel ConnersGutierrez, Colleen, CNM   Overview Addendum 04/23/2017  9:50 AM by Natale MilchSchuman, Christanna R, MD    Low lying/marginal placenta on ER US 04/21/17 [ ]  Will need follow up at anatomy US 18-20 weeks      First trimester  bleeding 02/16/2017 by Conard NovakJackson, Stephen D, MD 06/26/2017 by Vena AustriaStaebler, Andreas, MD       Preterm labor symptoms and general obstetric precautions including but not limited to vaginal bleeding, contractions, leaking of fluid and fetal movement were reviewed in detail with the patient.  Recommend Benadryl at HS for itching. Aveeno bath for itching. Hands/knees, stretching, abdominal support band for sciatic pain.   Return in about 2 weeks (around 09/04/2017) for rob.  Tresea MallJane Zavion Sleight, CNM 08/21/2017 9:48 AM

## 2017-09-04 ENCOUNTER — Ambulatory Visit (INDEPENDENT_AMBULATORY_CARE_PROVIDER_SITE_OTHER): Payer: BLUE CROSS/BLUE SHIELD | Admitting: Obstetrics and Gynecology

## 2017-09-04 VITALS — BP 112/80 | Wt 249.0 lb

## 2017-09-04 DIAGNOSIS — F32A Depression, unspecified: Secondary | ICD-10-CM | POA: Insufficient documentation

## 2017-09-04 DIAGNOSIS — Z3A34 34 weeks gestation of pregnancy: Secondary | ICD-10-CM

## 2017-09-04 DIAGNOSIS — Z6841 Body Mass Index (BMI) 40.0 and over, adult: Secondary | ICD-10-CM

## 2017-09-04 DIAGNOSIS — O99210 Obesity complicating pregnancy, unspecified trimester: Secondary | ICD-10-CM

## 2017-09-04 DIAGNOSIS — O0991 Supervision of high risk pregnancy, unspecified, first trimester: Secondary | ICD-10-CM

## 2017-09-04 DIAGNOSIS — F419 Anxiety disorder, unspecified: Secondary | ICD-10-CM

## 2017-09-04 DIAGNOSIS — F329 Major depressive disorder, single episode, unspecified: Secondary | ICD-10-CM

## 2017-09-04 NOTE — Progress Notes (Signed)
Routine Prenatal Care Visit  Subjective  Shelby Obrien is a 27 y.o. G3P1011 at [redacted]w[redacted]d being seen today for ongoing prenatal care.  She is currently monitored for the following issues for this high-risk pregnancy and has Migraines; Asthma; Supervision of high risk pregnancy, antepartum, first trimester; Maternal obesity affecting pregnancy, antepartum; BMI 40.0-44.9, adult (HCC); Nausea and vomiting in pregnancy; and Anxiety and depression on their problem list.  ----------------------------------------------------------------------------------- Patient reports nausea and heartburn. Is not taking prilosec daily, currently every other day.   Contractions: Not present. Vag. Bleeding: None.  Movement: Present. Denies leaking of fluid.  ----------------------------------------------------------------------------------- The following portions of the patient's history were reviewed and updated as appropriate: allergies, current medications, past family history, past medical history, past social history, past surgical history and problem list. Problem list updated.   Objective  Blood pressure 112/80, weight 249 lb (112.9 kg), last menstrual period 01/07/2017, unknown if currently breastfeeding. Pregravid weight 268 lb (121.6 kg) Total Weight Gain -19 lb (-8.618 kg) Urinalysis:      Fetal Status: Fetal Heart Rate (bpm): 147 Fundal Height: 40 cm Movement: Present     General:  Alert, oriented and cooperative. Patient is in no acute distress.  Skin: Skin is warm and dry. No rash noted.   Cardiovascular: Normal heart rate noted  Respiratory: Normal respiratory effort, no problems with respiration noted  Abdomen: Soft, gravid, appropriate for gestational age. Pain/Pressure: Absent     Pelvic:  Cervical exam deferred        Extremities: Normal range of motion.     ental Status: Normal mood and affect. Normal behavior. Normal judgment and thought content.     Assessment   27 y.o.  G3P1011 at [redacted]w[redacted]d by  10/14/2017, by Last Menstrual Period presenting for routine prenatal visit  Plan   pregnancy Problems (from 02/16/17 to present)    Problem Noted Resolved   Nausea and vomiting in pregnancy 03/05/2017 by Oswaldo Conroy, CNM No   Supervision of high risk pregnancy, antepartum, first trimester 02/26/2017 by Oswaldo Conroy, CNM No   Overview Addendum 09/04/2017  9:12 AM by Natale Milch, MD    [facility]inic [facility] Prenatal Labs  Dating  LMP=7w [place] Blood type: A Pos  Genetic Screen 1 Screen:    AFP:     Quad:     NIPS: Antibody:   Anatomic [place]  complete, normal Rubella: Immune Varicella: Immune  GTT Early:  52             Third trimester: 101 RPR: NR  Rhogam Not applicable HBsAg: Negative  TDaP vaccine 08/07/17                       Flu Shot: HIV: NR  Baby Food  Breast                              GBS:   Contraception Given information, undecided Pap: NILM 10/2016  CBB  Given information   CS/VBAC Not applicable   Support Person        BMI >=40 Arly.Keller ] early 1h gtt -  Arly.Keller ] u/s for dating    nutritional goals  folic acid  Arly.Keller ] bASA (>12 weeks)  consider nutrition consult  consider maternal EKG 1st trimester  Growth u/s 28 Arly.Keller ], 45 Arly.Keller ], 36 weeks Arly.Keller ]  NST/AFI weekly 36+  weeks (36[] , 37[] , 38[] , 39[] , 40[] )  IOL by 41 weeks (scheduled, prn )       Maternal obesity affecting pregnancy, antepartum 02/26/2017 by Oswaldo Conroy, CNM No   BMI 40.0-44.9, adult (HCC) 02/26/2017 by Oswaldo Conroy, CNM No   Placenta previa antepartum in second trimester 04/23/2017 by Natale Milch, MD 05/29/2017 by Farrel Conners, CNM   Overview Addendum 04/23/2017  9:50 AM by Natale Milch, MD    Low lying/marginal placenta on ER [place] 04/21/17  Will need follow up at anatomy US 18-20 weeks      First trimester bleeding 02/16/2017 by Conard Novak, MD 06/26/2017 by Vena Austria, MD       Gestational age appropriate  obstetric precautions including but not limited to vaginal bleeding, contractions, leaking of fluid and fetal movement were reviewed in detail with the patient.    Growth [place] in 2 weeks Initiate weekly NST AFI's at 36 weeks Given information on prenatal classes with [facility].  Given information on birthcontrol options postpartum. Recommended [email or web address].  Given information on cord blood banking. Discussed breast feeding. Patient is planning on breast feeding. Patient was given resources and lactation consultation was advised.   Return in about 2 weeks (around 09/18/2017) for [place], NST, and ROB.  Adelene Idler MD [facility], [facility] 09/04/17 9:14 AM

## 2017-09-04 NOTE — Patient Instructions (Signed)

## 2017-09-18 ENCOUNTER — Encounter: Payer: Self-pay | Admitting: Advanced Practice Midwife

## 2017-09-18 ENCOUNTER — Ambulatory Visit (INDEPENDENT_AMBULATORY_CARE_PROVIDER_SITE_OTHER): Payer: BLUE CROSS/BLUE SHIELD

## 2017-09-18 ENCOUNTER — Ambulatory Visit (INDEPENDENT_AMBULATORY_CARE_PROVIDER_SITE_OTHER): Payer: BLUE CROSS/BLUE SHIELD | Admitting: Advanced Practice Midwife

## 2017-09-18 VITALS — BP 100/60 | Wt 251.0 lb

## 2017-09-18 DIAGNOSIS — Z3685 Encounter for antenatal screening for Streptococcus B: Secondary | ICD-10-CM

## 2017-09-18 DIAGNOSIS — Z3A36 36 weeks gestation of pregnancy: Secondary | ICD-10-CM

## 2017-09-18 DIAGNOSIS — O0993 Supervision of high risk pregnancy, unspecified, third trimester: Secondary | ICD-10-CM

## 2017-09-18 DIAGNOSIS — O99210 Obesity complicating pregnancy, unspecified trimester: Secondary | ICD-10-CM

## 2017-09-18 DIAGNOSIS — O0991 Supervision of high risk pregnancy, unspecified, first trimester: Secondary | ICD-10-CM

## 2017-09-18 DIAGNOSIS — O99213 Obesity complicating pregnancy, third trimester: Secondary | ICD-10-CM | POA: Diagnosis not present

## 2017-09-18 DIAGNOSIS — Z113 Encounter for screening for infections with a predominantly sexual mode of transmission: Secondary | ICD-10-CM

## 2017-09-18 LAB — FETAL NONSTRESS TEST

## 2017-09-18 NOTE — Progress Notes (Signed)
Routine Prenatal Care Visit  Subjective  Shelby Obrien is a 27 y.o. G3P1011 at [redacted]w[redacted]d being seen today for ongoing prenatal care.  She is currently monitored for the following issues for this high-risk pregnancy and has Migraines; Asthma; Supervision of high risk pregnancy, antepartum, first trimester; Maternal obesity affecting pregnancy, antepartum; BMI 40.0-44.9, adult (HCC); Nausea and vomiting in pregnancy; and Anxiety and depression on their problem list.  ----------------------------------------------------------------------------------- Patient reports irregular contractions and pelvic pressure.   Contractions: Irregular. Vag. Bleeding: None.  Movement: Present. Denies leaking of fluid.  ----------------------------------------------------------------------------------- The following portions of the patient's history were reviewed and updated as appropriate: allergies, current medications, past family history, past medical history, past social history, past surgical history and problem list. Problem list updated.   Objective  Blood pressure 100/60, weight 251 lb (113.9 kg), last menstrual period 01/07/2017 Pregravid weight 268 lb (121.6 kg) Total Weight Gain -17 lb (-7.711 kg) Urinalysis: Urine Protein: Negative Urine Glucose: Negative  Fetal Status: Fetal Heart Rate (bpm): 150   Movement: Present     Growth scan 6 pounds 12 ounces, 61.65%, AFI 12.95 NST reactive: 150 bpm, moderate variability, +accelerations, -decelerations  General:  Alert, oriented and cooperative. Patient is in no acute distress.  Skin: Skin is warm and dry. No rash noted.   Cardiovascular: Normal heart rate noted  Respiratory: Normal respiratory effort, no problems with respiration noted  Abdomen: Soft, gravid, appropriate for gestational age. Pain/Pressure: Present     Pelvic:  GBS/aptima collected. Cervix visually appears closed.        Extremities: Normal range of motion.     Mental Status:  Normal mood and affect. Normal behavior. Normal judgment and thought content.   Assessment   27 y.o. G3P1011 at [redacted]w[redacted]d by  10/14/2017, by Last Menstrual Period presenting for routine prenatal visit  Plan   pregnancy Problems (from 02/16/17 to present)    Problem Noted Resolved   Nausea and vomiting in pregnancy 03/05/2017 by Oswaldo Conroy, CNM No   Supervision of high risk pregnancy, antepartum, first trimester 02/26/2017 by Oswaldo Conroy, CNM No   Overview Addendum 09/04/2017  9:12 AM by Natale Milch, MD    [facility]inic [facility] Prenatal Labs  Dating  LMP=7w [place] Blood type: A Pos  Genetic Screen 1 Screen:    AFP:     Quad:     NIPS: Antibody:   Anatomic [place]  complete, normal Rubella: Immune Varicella: Immune  GTT Early:  12             Third trimester: 101 RPR: NR  Rhogam Not applicable HBsAg: Negative  TDaP vaccine 08/07/17                       Flu Shot: HIV: NR  Baby Food  Breast                              GBS:   Contraception Given information, undecided Pap: NILM 10/2016  CBB  Given information   CS/VBAC Not applicable   Support Person        BMI >=40 Arly.Keller ] early 1h gtt -  Arly.Keller ] u/s for dating    nutritional goals  folic acid  Arly.Keller ] bASA (>12 weeks)  consider nutrition consult  consider maternal EKG 1st trimester  Growth u/s 28 Arly.Keller ], 32 Arly.Keller ], 36 weeks Arly.Keller ]   NST/AFI weekly 36+ weeks (36[x] , 37[] , 38[] , 39[] , 40[] )  IOL by 41 weeks (scheduled, prn )       Maternal obesity affecting pregnancy, antepartum 02/26/2017 by Oswaldo Conroy, CNM No   BMI 40.0-44.9, adult (HCC) 02/26/2017 by Oswaldo Conroy, CNM No   Placenta previa antepartum in second trimester 04/23/2017 by Natale Milch, MD 05/29/2017 by Farrel Conners, CNM   Overview Addendum 04/23/2017  9:50 AM by Natale Milch, MD    Low lying/marginal placenta on ER [place] 04/21/17  Will need follow up at anatomy US 18-20 weeks      First trimester bleeding  02/16/2017 by Conard Novak, MD 06/26/2017 by Vena Austria, MD       Preterm labor symptoms and general obstetric precautions including but not limited to vaginal bleeding, contractions, leaking of fluid and fetal movement were reviewed in detail with the patient. Please refer to After Visit Summary for other counseling recommendations.   Return in about 1 week (around 09/25/2017) for rob.  Tresea Mall, CNM 09/18/2017 11:29 AM

## 2017-09-18 NOTE — Progress Notes (Signed)
U/S & NST today. C/o pressure & unable to sleep since last visit.

## 2017-09-20 LAB — STREP GP B NAA: Strep Gp B NAA: POSITIVE — AB

## 2017-09-23 ENCOUNTER — Observation Stay
Admission: EM | Admit: 2017-09-23 | Discharge: 2017-09-23 | Disposition: A | Discharge status: Home / Self Care | Payer: Medicaid Other | Attending: Certified Nurse Midwife | Admitting: Certified Nurse Midwife

## 2017-09-23 ENCOUNTER — Other Ambulatory Visit: Payer: Self-pay

## 2017-09-23 DIAGNOSIS — E669 Obesity, unspecified: Secondary | ICD-10-CM | POA: Insufficient documentation

## 2017-09-23 DIAGNOSIS — O26899 Other specified pregnancy related conditions, unspecified trimester: Secondary | ICD-10-CM | POA: Diagnosis present

## 2017-09-23 DIAGNOSIS — O26893 Other specified pregnancy related conditions, third trimester: Secondary | ICD-10-CM

## 2017-09-23 DIAGNOSIS — O471 False labor at or after 37 completed weeks of gestation: Principal | ICD-10-CM | POA: Insufficient documentation

## 2017-09-23 DIAGNOSIS — R109 Unspecified abdominal pain: Secondary | ICD-10-CM | POA: Diagnosis not present

## 2017-09-23 DIAGNOSIS — O99213 Obesity complicating pregnancy, third trimester: Secondary | ICD-10-CM | POA: Insufficient documentation

## 2017-09-23 DIAGNOSIS — Z3A37 37 weeks gestation of pregnancy: Secondary | ICD-10-CM | POA: Diagnosis not present

## 2017-09-23 HISTORY — DX: Obesity, unspecified: E66.9

## 2017-09-23 LAB — CHLAMYDIA/GONOCOCCUS/TRICHOMONAS, NAA
Chlamydia by NAA: NEGATIVE
GONOCOCCUS BY NAA: NEGATIVE
TRICH VAG BY NAA: NEGATIVE

## 2017-09-23 MED ORDER — ONDANSETRON 4 MG PO TBDP
4.0000 mg | ORAL_TABLET | Freq: Four times a day (QID) | ORAL | Status: DC | PRN
  Administered 2017-09-23: 4 mg via ORAL
  Filled 2017-09-23: qty 1

## 2017-09-23 NOTE — Final Progress Note (Signed)
Physician Final Progress Note  Patient ID: Shelby Obrien MRN: [id number] DOB/AGE: [birth year] 27 y.o.  Admit date: 09/23/2017 Admitting provider: Suzy Bouchard, MD Discharge date: 09/23/2017   Admission Diagnoses: Abdominal pain in pregnancy  Discharge Diagnoses:  Active Problems:   Abdominal pain in pregnancy   Consults: None  Significant Findings/ Diagnostic Studies: 27 year old G3 P1011 with EDC=10/14/2017 by LMP=7wk1d ultrasound presents at 37 weeks with complaints of intermittent upper abdominal pain that began last night. The pains woke her up and became more frequent at 0500 this AM. She describes them as sharp and intermittent. Standing up make the pain worse. Has felt some tightening of the uterus with the pain. Admits to nausea this AM and reflux/heartburn. Last ate last night (fried chicken sandwich). No vaginal bleeding, fever, blood in stool. Last BM 6 days ago. Baby active.  Prenatal care at [facility] has been complicated by asthma, anxiety/depression, and obesity with BMI currently at 43.08 kg/m2. Has lost 17# with the pregnancy. Last EFW on 5/24 was 6#12oz.  Prenatal course also remarkable for : [facility] Prenatal Labs  Dating  LMP=7w [place] Blood type: A Pos  Genetic Screen 1 Screen: neg   AFP:     Quad:     NIPS: Antibody:   Anatomic [place]  complete, normal Rubella: Immune Varicella: Immune  GTT Early:  59             Third trimester: 101 RPR: NR  Rhogam Not applicable HBsAg: Negative  TDaP vaccine 08/07/17                       Flu Shot: HIV: NR  Baby Food  Breast                              WUJ:[id number] (05/24 1611)  Contraception Given information, undecided Pap: NILM 10/2016  CBB  Given information   CS/VBAC Not applicable   Support Person        BMI >=40  OB History  Gravida Para Term Preterm AB Living  SAB TAB Ectopic Multiple Live Births  1       1    # Outcome Date GA Lbr Len/2nd Weight Sex Delivery Anes PTL Lv  3 Current            2 Term 04/09/14 [redacted]w[redacted]d  3.317 kg (7 lb 5 oz) F  EPI N LIV  1 SAB            Past Medical History:  Diagnosis Date  . Asthma   . Migraines   . Obesity    BMI>40   Past Surgical History:  Procedure Laterality Date  . WISDOM TOOTH EXTRACTION     Family History  Problem Relation Age of Onset  . Cervical cancer Mother   . Diabetes Maternal Grandmother   . Heart disease Maternal Grandmother   . Diabetes Paternal Grandmother   . Heart disease Paternal Grandmother   . Diabetes Paternal Uncle    Social History   Socioeconomic History  . Marital status: Single    Spouse name: Not on file  . Number of children: 1  . Years of education: Not on file  . Highest education level: Not on file  Occupational History  . Not on file  Social Needs  . Financial resource strain: Not on file  . Food insecurity:  Worry: Not on file    Inability: Not on file  . Transportation needs:    Medical: Not on file    Non-medical: Not on file  Tobacco Use  . Smoking status: Former Smoker    Packs/day: 0.00  . Smokeless tobacco: Never Used  Substance and Sexual Activity  . Alcohol use: No  . Drug use: No  . Sexual activity: Yes    Birth control/protection: None  Lifestyle  . Physical activity:    Days per week: Not on file    Minutes per session: Not on file  . Stress: Not on file  Relationships  . Social connections:    Talks on phone: Not on file    Gets together: Not on file    Attends religious service: Not on file    Active member of club or organization: Not on file    Attends meetings of clubs or organizations: Not on file    Relationship status: Not on file  . Intimate partner violence:    Fear of current or ex partner: Not on file    Emotionally abused: Not on file    Physically abused: Not on file    Forced sexual activity: Not on file  Other Topics Concern  . Not on file  Social History Narrative  . Not on file   Exam: General: lying in bed, looks comfortable,  does not appear ill Vital signs: 105/73,  Pulse 96   Temp 98.3 F (36.8 C) (Oral)   Resp 16   Wt 113.9 kg (251 lb)   LMP 01/07/2017   BMI 43.08 kg/m   Lungs: CTAB Heart: RRR without murmur Abdomen: BS x4, most tender over fundus with palpation of fetal breech. Negative Murphy's sign, mild TTP in epigastric to LUQ, no hepatomegaly. FHR: 140 with accelerations to 160s to 180, moderate variability Toco: some uterine irritability and FM, patient movement noted on tracing. Pelvic: External: no lesions or inflammation  Cervix: internal os closed/ 50-60%/ posterior/-1 to -2 Ultrasound: cephalic presentation (ROT)  Extremities: trace edema, no calf tenderness Neuro: alert, awake and answering questions appropriately Psyche: normal mood and affect  A: IUP at 37 weeks with fundal tenderness over breech-no fever, fetal tachycardia.   May be due to contractions/ FM Constipation-no BM x6days/ reflux-may be adding to nausea and abdominal pain  Discussed use of OTC laxatives FWB: Cat 1 tracing  P: DC home with labor precautions Also advised to notify OB provider for fever, decreased FM Has appointment in office in 2 days for ROB.  Procedures: none  Discharge Condition: stable  Disposition: Discharge disposition: 01-Home or Self Care       Diet: Regular diet  Discharge Activity: Activity as tolerated  Discharge Instructions    Discharge patient   Complete by:  As directed    Discharge disposition:  01-Home or Self Care   Discharge patient date:  09/23/2017     Allergies as of 09/23/2017      Reactions   Shellfish Allergy Anaphylaxis   Other    BLUEBERRIES, BANANAS, TREE NUTS, RASPBERRIES, GRAPES, POLLENS/SEASONAL.      Medication List    STOP taking these medications   aspirin EC 81 MG tablet     TAKE these medications   albuterol 108 (90 Base) MCG/ACT inhaler Commonly known as:  PROVENTIL HFA;VENTOLIN HFA Inhale 2 puffs into the lungs every 4 (four) hours as  needed for wheezing or shortness of breath.   ALPRAZolam 0.5 MG tablet Commonly known  as:  XANAX Take 1 tablet (0.5 mg total) by mouth 2 (two) times daily as needed for anxiety.   docusate sodium 100 MG capsule Commonly known as:  COLACE Take 1 capsule (100 mg total) by mouth 2 (two) times daily as needed.   ferrous sulfate 325 (65 FE) MG tablet Commonly known as:  FERROUSUL Take 1 tablet (325 mg total) by mouth daily.   folic acid 1 MG tablet Commonly known as:  FOLVITE Take 1 tablet (1 mg total) by mouth daily.   omeprazole 20 MG capsule Commonly known as:  PRILOSEC Take 1 capsule (20 mg total) by mouth daily.   simethicone 80 MG chewable tablet Commonly known as:  MYLICON Chew 1 tablet (80 mg total) by mouth every 6 (six) hours as needed for flatulence.        Total time spent taking care of this patient: 20 minutes  Signed: Farrel Conners 09/23/2017, 1:42 PM

## 2017-09-23 NOTE — OB Triage Note (Signed)
Pt c/o sharp pains that come and go along the top pf her abdomen. It started last night and did keep her awake, she did not take anything for pain. Last BM 09/17/2017. Still feeling baby move, and denies any vaginal bleeding or fluid leaking.

## 2017-09-25 ENCOUNTER — Encounter: Payer: Self-pay | Admitting: Maternal Newborn

## 2017-09-25 ENCOUNTER — Ambulatory Visit (INDEPENDENT_AMBULATORY_CARE_PROVIDER_SITE_OTHER): Payer: BLUE CROSS/BLUE SHIELD | Admitting: Maternal Newborn

## 2017-09-25 VITALS — BP 128/72 | Wt 252.0 lb

## 2017-09-25 DIAGNOSIS — Z6841 Body Mass Index (BMI) 40.0 and over, adult: Secondary | ICD-10-CM

## 2017-09-25 DIAGNOSIS — O0991 Supervision of high risk pregnancy, unspecified, first trimester: Secondary | ICD-10-CM

## 2017-09-25 DIAGNOSIS — Z369 Encounter for antenatal screening, unspecified: Secondary | ICD-10-CM

## 2017-09-25 DIAGNOSIS — Z3A37 37 weeks gestation of pregnancy: Secondary | ICD-10-CM

## 2017-09-25 LAB — FETAL NONSTRESS TEST

## 2017-09-25 NOTE — Progress Notes (Signed)
C/o still having sharp pains upper abdomen.rj

## 2017-09-25 NOTE — Progress Notes (Signed)
Routine Prenatal Care Visit  Subjective  Shelby Obrien is a 27 y.o. G3P1011 at [redacted]w[redacted]d being seen today for ongoing prenatal care.  She is currently monitored for the following issues for this high-risk pregnancy and has Migraines; Asthma; Supervision of high risk pregnancy, antepartum, first trimester; Maternal obesity affecting pregnancy, antepartum; BMI 40.0-44.9, adult (HCC); Nausea and vomiting in pregnancy; Anxiety and depression; and Abdominal pain in pregnancy on their problem list.  ---------------------------------------------------------------------------------- Patient reports some occasional pains in her upper abdomen.   Contractions: Not present. Vag. Bleeding: None.  Movement: Present. No leaking of fluid.  ---------------------------------------------------------------------------------- The following portions of the patient's history were reviewed and updated as appropriate: allergies, current medications, past family history, past medical history, past social history, past surgical history and problem list. Problem list updated.   Objective  Blood pressure 128/72, weight 252 lb (114.3 kg), last menstrual period 01/07/2017. Pregravid weight 268 lb (121.6 kg) Total Weight Gain -16 lb (-7.258 kg) Urinalysis: Urine Protein: Negative Urine Glucose: Negative  Fetal Status: Fetal Heart Rate (bpm): 135   Movement: Present     General:  Alert, oriented and cooperative. Patient is in no acute distress.  Skin: Skin is warm and dry. No rash noted.   Cardiovascular: Normal heart rate noted  Respiratory: Normal respiratory effort, no problems with respiration noted  Abdomen: Soft, gravid, appropriate for gestational age. Pain/Pressure: Absent     Pelvic:  Cervical exam deferred        Extremities: Normal range of motion.  Edema: None  Mental Status: Normal mood and affect. Normal behavior. Normal judgment and thought content.   NST Baseline: 135 Variability:  moderate Accelerations: present Decelerations: absent Tocometry: none The patient was monitored for 30 minutes, fetal heart rate tracing was deemed reactive, category I tracing.   Assessment   27 y.o. G3P1011 at [redacted]w[redacted]d, EDD 10/14/2017 by Last Menstrual Period presenting for routine prenatal visit.  Plan   pregnancy Problems (from 02/16/17 to present)    Problem Noted Resolved   Nausea and vomiting in pregnancy 03/05/2017 by Oswaldo Conroy, CNM No   Supervision of high risk pregnancy, antepartum, first trimester 02/26/2017 by Oswaldo Conroy, CNM No   Overview Addendum 09/23/2017  1:41 PM by Farrel Conners, CNM    [facility] Prenatal Labs  Dating  LMP=7w [place] Blood type: A Pos  Genetic Screen 1 Screen: neg   AFP:     Quad:     NIPS: Antibody:   Anatomic [place]  complete, normal Rubella: Immune Varicella: Immune  GTT Early:  86             Third trimester: 101 RPR: NR  Rhogam Not applicable HBsAg: Negative  TDaP vaccine 08/07/17                       Flu Shot: HIV: NR  Baby Food  Breast                              [id number] (05/24 1611)  Contraception Given information, undecided Pap: NILM 10/2016  CBB  Given information   CS/VBAC Not applicable   Support Person        BMI >=40 Arly.Keller ] early 1h gtt -  Arly.Keller ] u/s for dating    nutritional goals  folic acid  Arly.Keller ] bASA (>12 weeks)  consider nutrition consult  consider maternal  EKG 1st trimester  Growth u/s 28 Arly.Keller ], 26 Arly.Keller ], 36 weeks Arly.Keller ]  NST/AFI weekly 36+ weeks (36[] , 37[] , 38[] , 39[] , 40[] )  IOL by 41 weeks (scheduled, prn )       Maternal obesity affecting pregnancy, antepartum 02/26/2017 by Oswaldo Conroy, CNM No   BMI 40.0-44.9, adult (HCC) 02/26/2017 by Oswaldo Conroy, CNM No   Placenta previa antepartum in second trimester 04/23/2017 by Natale Milch, MD 05/29/2017 by Farrel Conners, CNM   Overview Addendum 04/23/2017  9:50 AM by Natale Milch, MD    Low  lying/marginal placenta on ER [place] 04/21/17  Will need follow up at anatomy US 18-20 weeks      First trimester bleeding 02/16/2017 by Conard Novak, MD 06/26/2017 by Vena Austria, MD    Reactive NST today.   Term labor symptoms and general obstetric precautions including but not limited to vaginal bleeding, contractions, leaking of fluid and fetal movement were reviewed.  Return in about 1 week (around 10/02/2017) for ROB with NST/AFI.  Marcelyn Bruins, CNM 09/28/2017  11:48 AM

## 2017-09-26 LAB — ANTIBODY SCREEN: ANTIBODY SCREEN: NEGATIVE

## 2017-10-02 ENCOUNTER — Ambulatory Visit (INDEPENDENT_AMBULATORY_CARE_PROVIDER_SITE_OTHER): Payer: BLUE CROSS/BLUE SHIELD

## 2017-10-02 ENCOUNTER — Ambulatory Visit (INDEPENDENT_AMBULATORY_CARE_PROVIDER_SITE_OTHER): Payer: BLUE CROSS/BLUE SHIELD | Admitting: Obstetrics & Gynecology

## 2017-10-02 VITALS — BP 120/80 | Wt 252.0 lb

## 2017-10-02 DIAGNOSIS — O0993 Supervision of high risk pregnancy, unspecified, third trimester: Secondary | ICD-10-CM | POA: Diagnosis not present

## 2017-10-02 DIAGNOSIS — Z3A38 38 weeks gestation of pregnancy: Secondary | ICD-10-CM

## 2017-10-02 DIAGNOSIS — Z369 Encounter for antenatal screening, unspecified: Secondary | ICD-10-CM

## 2017-10-02 DIAGNOSIS — O99213 Obesity complicating pregnancy, third trimester: Secondary | ICD-10-CM

## 2017-10-02 DIAGNOSIS — O99210 Obesity complicating pregnancy, unspecified trimester: Secondary | ICD-10-CM

## 2017-10-02 DIAGNOSIS — O0991 Supervision of high risk pregnancy, unspecified, first trimester: Secondary | ICD-10-CM

## 2017-10-02 NOTE — Patient Instructions (Signed)
Braxton Hicks Contractions °Contractions of the uterus can occur throughout pregnancy, but they are not always a sign that you are in labor. You may have practice contractions called Braxton Hicks contractions. These false labor contractions are sometimes confused with true labor. °What are Braxton Hicks contractions? °Braxton Hicks contractions are tightening movements that occur in the muscles of the uterus before labor. Unlike true labor contractions, these contractions do not result in opening (dilation) and thinning of the cervix. Toward the end of pregnancy (32-34 weeks), Braxton Hicks contractions can happen more often and may become stronger. These contractions are sometimes difficult to tell apart from true labor because they can be very uncomfortable. You should not feel embarrassed if you go to the hospital with false labor. °Sometimes, the only way to tell if you are in true labor is for your health care provider to look for changes in the cervix. The health care provider will do a physical exam and may monitor your contractions. If you are not in true labor, the exam should show that your cervix is not dilating and your water has not broken. °If there are other health problems associated with your pregnancy, it is completely safe for you to be sent home with false labor. You may continue to have Braxton Hicks contractions until you go into true labor. °How to tell the difference between true labor and false labor °True labor °· Contractions last 30-70 seconds. °· Contractions become very regular. °· Discomfort is usually felt in the top of the uterus, and it spreads to the lower abdomen and low back. °· Contractions do not go away with walking. °· Contractions usually become more intense and increase in frequency. °· The cervix dilates and gets thinner. °False labor °· Contractions are usually shorter and not as strong as true labor contractions. °· Contractions are usually irregular. °· Contractions  are often felt in the front of the lower abdomen and in the groin. °· Contractions may go away when you walk around or change positions while lying down. °· Contractions get weaker and are shorter-lasting as time goes on. °· The cervix usually does not dilate or become thin. °Follow these instructions at home: °· Take over-the-counter and prescription medicines only as told by your health care provider. °· Keep up with your usual exercises and follow other instructions from your health care provider. °· Eat and drink lightly if you think you are going into labor. °· If Braxton Hicks contractions are making you uncomfortable: °? Change your position from lying down or resting to walking, or change from walking to resting. °? Sit and rest in a tub of warm water. °? Drink enough fluid to keep your urine pale yellow. Dehydration may cause these contractions. °? Do slow and deep breathing several times an hour. °· Keep all follow-up prenatal visits as told by your health care provider. This is important. °Contact a health care provider if: °· You have a fever. °· You have continuous pain in your abdomen. °Get help right away if: °· Your contractions become stronger, more regular, and closer together. °· You have fluid leaking or gushing from your vagina. °· You pass blood-tinged mucus (bloody show). °· You have bleeding from your vagina. °· You have low back pain that you never had before. °· You feel your baby’s head pushing down and causing pelvic pressure. °· Your baby is not moving inside you as much as it used to. °Summary °· Contractions that occur before labor are called Braxton   Hicks contractions, false labor, or practice contractions. °· Braxton Hicks contractions are usually shorter, weaker, farther apart, and less regular than true labor contractions. True labor contractions usually become progressively stronger and regular and they become more frequent. °· Manage discomfort from Braxton Hicks contractions by  changing position, resting in a warm bath, drinking plenty of water, or practicing deep breathing. °This information is not intended to replace advice given to you by your health care provider. Make sure you discuss any questions you have with your health care provider. °Document Released: 08/28/2016 Document Revised: 08/28/2016 Document Reviewed: 08/28/2016 °Elsevier Interactive Patient Education © 2018 [employer] ° °

## 2017-10-02 NOTE — Progress Notes (Signed)
  Subjective  Fetal Movement? yes Contractions? no Leaking Fluid? no Vaginal Bleeding? no  Objective  BP 120/80   Wt 252 lb (114.3 kg)   LMP 01/07/2017   BMI 43.26 kg/m  General: NAD Pumonary: no increased work of breathing Abdomen: gravid, non-tender Extremities: no edema Psychiatric: mood appropriate, affect full Cx 1/60/-3 Assessment  27 y.o. G3P1011 at 2744w2d by  10/14/2017, by Last Menstrual Period presenting for routine prenatal visit  Plan   Problem List Items Addressed This Visit      Other   Supervision of high risk pregnancy, antepartum, first trimester   Relevant Orders   US OB Limited   Maternal obesity affecting pregnancy, antepartum   Relevant Orders   US OB Limited    Other Visit Diagnoses    [redacted] weeks gestation of pregnancy    -  Primary   Relevant Orders   US OB Limited    A NST procedure was performed with FHR monitoring and a normal baseline established, appropriate time of 20-40 minutes of evaluation, and accels >2 seen w 15x15 characteristics.  Results show a REACTIVE NST.  Labor precauitons discussed Annamarie MajorPaul Harris, MD, Merlinda FrederickFACOG [facility] 10/02/2017  3:06 PM

## 2017-10-03 ENCOUNTER — Inpatient Hospital Stay
Admission: EM | Admit: 2017-10-03 | Discharge: 2017-10-05 | DRG: 806 | Disposition: A | Discharge status: Home / Self Care | Payer: Medicaid Other | Attending: Obstetrics and Gynecology | Admitting: Obstetrics and Gynecology

## 2017-10-03 ENCOUNTER — Inpatient Hospital Stay: Payer: Medicaid Other | Admitting: Anesthesiology

## 2017-10-03 ENCOUNTER — Other Ambulatory Visit: Payer: Self-pay

## 2017-10-03 DIAGNOSIS — O9962 Diseases of the digestive system complicating childbirth: Secondary | ICD-10-CM | POA: Diagnosis present

## 2017-10-03 DIAGNOSIS — O9952 Diseases of the respiratory system complicating childbirth: Secondary | ICD-10-CM | POA: Diagnosis present

## 2017-10-03 DIAGNOSIS — O9081 Anemia of the puerperium: Secondary | ICD-10-CM | POA: Diagnosis not present

## 2017-10-03 DIAGNOSIS — Z87891 Personal history of nicotine dependence: Secondary | ICD-10-CM | POA: Diagnosis not present

## 2017-10-03 DIAGNOSIS — D62 Acute posthemorrhagic anemia: Secondary | ICD-10-CM | POA: Diagnosis not present

## 2017-10-03 DIAGNOSIS — O99210 Obesity complicating pregnancy, unspecified trimester: Secondary | ICD-10-CM

## 2017-10-03 DIAGNOSIS — O219 Vomiting of pregnancy, unspecified: Secondary | ICD-10-CM

## 2017-10-03 DIAGNOSIS — K219 Gastro-esophageal reflux disease without esophagitis: Secondary | ICD-10-CM | POA: Diagnosis present

## 2017-10-03 DIAGNOSIS — O99824 Streptococcus B carrier state complicating childbirth: Secondary | ICD-10-CM | POA: Diagnosis not present

## 2017-10-03 DIAGNOSIS — O99214 Obesity complicating childbirth: Secondary | ICD-10-CM | POA: Diagnosis present

## 2017-10-03 DIAGNOSIS — Z6841 Body Mass Index (BMI) 40.0 and over, adult: Secondary | ICD-10-CM

## 2017-10-03 DIAGNOSIS — O0991 Supervision of high risk pregnancy, unspecified, first trimester: Secondary | ICD-10-CM

## 2017-10-03 DIAGNOSIS — Z3A38 38 weeks gestation of pregnancy: Secondary | ICD-10-CM | POA: Diagnosis not present

## 2017-10-03 DIAGNOSIS — J45909 Unspecified asthma, uncomplicated: Secondary | ICD-10-CM | POA: Diagnosis present

## 2017-10-03 DIAGNOSIS — Z3483 Encounter for supervision of other normal pregnancy, third trimester: Secondary | ICD-10-CM | POA: Diagnosis present

## 2017-10-03 LAB — CBC
HCT: 33.5 % — ABNORMAL LOW (ref 35.0–47.0)
Hemoglobin: 11.5 g/dL — ABNORMAL LOW (ref 12.0–16.0)
MCH: 29.6 pg (ref 26.0–34.0)
MCHC: 34.3 g/dL (ref 32.0–36.0)
MCV: 86.2 fL (ref 80.0–100.0)
PLATELETS: 177 10*3/uL (ref 150–440)
RBC: 3.88 MIL/uL (ref 3.80–5.20)
RDW: 14.9 % — ABNORMAL HIGH (ref 11.5–14.5)
WBC: 6.4 10*3/uL (ref 3.6–11.0)

## 2017-10-03 LAB — TYPE AND SCREEN
ABO/RH(D): A POS
Antibody Screen: NEGATIVE

## 2017-10-03 MED ORDER — SENNOSIDES-DOCUSATE SODIUM 8.6-50 MG PO TABS
2.0000 | ORAL_TABLET | ORAL | Status: DC
  Administered 2017-10-04: 2 via ORAL
  Filled 2017-10-03 (×2): qty 2

## 2017-10-03 MED ORDER — HYDROCODONE-ACETAMINOPHEN 5-325 MG PO TABS
1.0000 | ORAL_TABLET | ORAL | Status: DC | PRN
  Administered 2017-10-04 – 2017-10-05 (×6): 1 via ORAL
  Filled 2017-10-03 (×6): qty 1

## 2017-10-03 MED ORDER — OXYTOCIN 10 UNIT/ML IJ SOLN: 10.0000 [IU] | Freq: Once | INTRAMUSCULAR | Status: DC

## 2017-10-03 MED ORDER — ACETAMINOPHEN 325 MG PO TABS: 650.0000 mg | ORAL_TABLET | ORAL | Status: DC | PRN

## 2017-10-03 MED ORDER — SIMETHICONE 80 MG PO CHEW: 80.0000 mg | CHEWABLE_TABLET | ORAL | Status: DC | PRN

## 2017-10-03 MED ORDER — MISOPROSTOL 200 MCG PO TABS
ORAL_TABLET | ORAL | Status: AC
  Filled 2017-10-03: qty 4

## 2017-10-03 MED ORDER — LIDOCAINE HCL (PF) 1 % IJ SOLN
30.0000 mL | INTRAMUSCULAR | Status: AC | PRN
  Administered 2017-10-03: 1.5 mL via SUBCUTANEOUS

## 2017-10-03 MED ORDER — FERROUS SULFATE 325 (65 FE) MG PO TABS
325.0000 mg | ORAL_TABLET | Freq: Two times a day (BID) | ORAL | Status: DC
  Administered 2017-10-04 – 2017-10-05 (×3): 325 mg via ORAL
  Filled 2017-10-03 (×3): qty 1

## 2017-10-03 MED ORDER — PANTOPRAZOLE SODIUM 40 MG PO TBEC
40.0000 mg | DELAYED_RELEASE_TABLET | Freq: Every day | ORAL | Status: DC
  Administered 2017-10-04 – 2017-10-05 (×2): 40 mg via ORAL
  Filled 2017-10-03 (×2): qty 1

## 2017-10-03 MED ORDER — FENTANYL 2.5 MCG/ML W/ROPIVACAINE 0.15% IN NS 100 ML EPIDURAL (ARMC)
EPIDURAL | Status: DC | PRN
  Administered 2017-10-03: 12 mL/h via EPIDURAL

## 2017-10-03 MED ORDER — LIDOCAINE HCL (PF) 1 % IJ SOLN
INTRAMUSCULAR | Status: AC
  Filled 2017-10-03: qty 30

## 2017-10-03 MED ORDER — PHENYLEPHRINE 40 MCG/ML (10ML) SYRINGE FOR IV PUSH (FOR BLOOD PRESSURE SUPPORT): 80.0000 ug | PREFILLED_SYRINGE | INTRAVENOUS | Status: DC | PRN

## 2017-10-03 MED ORDER — ONDANSETRON HCL 4 MG/2ML IJ SOLN: 4.0000 mg | Freq: Four times a day (QID) | INTRAMUSCULAR | Status: DC | PRN

## 2017-10-03 MED ORDER — LIDOCAINE-EPINEPHRINE (PF) 1.5 %-1:200000 IJ SOLN
INTRAMUSCULAR | Status: DC | PRN
  Administered 2017-10-03: 3 mL via EPIDURAL

## 2017-10-03 MED ORDER — COCONUT OIL OIL
1.0000 "application " | TOPICAL_OIL | Status: DC | PRN
  Administered 2017-10-04: 1 via TOPICAL
  Filled 2017-10-03: qty 120

## 2017-10-03 MED ORDER — DIBUCAINE 1 % RE OINT: 1.0000 "application " | TOPICAL_OINTMENT | RECTAL | Status: DC | PRN

## 2017-10-03 MED ORDER — TERBUTALINE SULFATE 1 MG/ML IJ SOLN: 0.2500 mg | Freq: Once | INTRAMUSCULAR | Status: DC | PRN

## 2017-10-03 MED ORDER — EPHEDRINE 5 MG/ML INJ: 10.0000 mg | INTRAVENOUS | Status: DC | PRN

## 2017-10-03 MED ORDER — SODIUM CHLORIDE 0.9 % IV SOLN
5.0000 10*6.[IU] | Freq: Once | INTRAVENOUS | Status: AC
  Administered 2017-10-03: 5 10*6.[IU] via INTRAVENOUS
  Filled 2017-10-03: qty 5

## 2017-10-03 MED ORDER — OXYTOCIN BOLUS FROM INFUSION
500.0000 mL | Freq: Once | INTRAVENOUS | Status: AC
  Administered 2017-10-03: 500 mL via INTRAVENOUS

## 2017-10-03 MED ORDER — ACETAMINOPHEN 325 MG PO TABS
650.0000 mg | ORAL_TABLET | Freq: Four times a day (QID) | ORAL | Status: DC | PRN
  Administered 2017-10-03: 650 mg via ORAL

## 2017-10-03 MED ORDER — FENTANYL 2.5 MCG/ML W/ROPIVACAINE 0.15% IN NS 100 ML EPIDURAL (ARMC): 12.0000 mL/h | EPIDURAL | Status: DC

## 2017-10-03 MED ORDER — SOD CITRATE-CITRIC ACID 500-334 MG/5ML PO SOLN: 30.0000 mL | ORAL | Status: DC | PRN

## 2017-10-03 MED ORDER — ONDANSETRON HCL 4 MG PO TABS: 4.0000 mg | ORAL_TABLET | ORAL | Status: DC | PRN

## 2017-10-03 MED ORDER — PRENATAL MULTIVITAMIN CH
1.0000 | ORAL_TABLET | Freq: Every day | ORAL | Status: DC
  Administered 2017-10-04: 1 via ORAL
  Filled 2017-10-03: qty 1

## 2017-10-03 MED ORDER — ONDANSETRON HCL 4 MG/2ML IJ SOLN: 4.0000 mg | INTRAMUSCULAR | Status: DC | PRN

## 2017-10-03 MED ORDER — WITCH HAZEL-GLYCERIN EX PADS: 1.0000 "application " | MEDICATED_PAD | CUTANEOUS | Status: DC | PRN

## 2017-10-03 MED ORDER — OXYTOCIN 40 UNITS IN LACTATED RINGERS INFUSION - SIMPLE MED
INTRAVENOUS | Status: AC
  Administered 2017-10-03: 500 mL via INTRAVENOUS
  Filled 2017-10-03: qty 1000

## 2017-10-03 MED ORDER — ACETAMINOPHEN 325 MG PO TABS
ORAL_TABLET | ORAL | Status: AC
  Filled 2017-10-03: qty 2

## 2017-10-03 MED ORDER — OXYTOCIN 40 UNITS IN LACTATED RINGERS INFUSION - SIMPLE MED
1.0000 m[IU]/min | INTRAVENOUS | Status: DC
  Administered 2017-10-03: 1 m[IU]/min via INTRAVENOUS

## 2017-10-03 MED ORDER — AMMONIA AROMATIC IN INHA
RESPIRATORY_TRACT | Status: AC
  Filled 2017-10-03: qty 10

## 2017-10-03 MED ORDER — DIPHENHYDRAMINE HCL 25 MG PO CAPS: 25.0000 mg | ORAL_CAPSULE | Freq: Four times a day (QID) | ORAL | Status: DC | PRN

## 2017-10-03 MED ORDER — IBUPROFEN 600 MG PO TABS
600.0000 mg | ORAL_TABLET | Freq: Four times a day (QID) | ORAL | Status: DC
  Administered 2017-10-03 – 2017-10-05 (×6): 600 mg via ORAL
  Filled 2017-10-03 (×6): qty 1

## 2017-10-03 MED ORDER — BENZOCAINE-MENTHOL 20-0.5 % EX AERO
1.0000 "application " | INHALATION_SPRAY | CUTANEOUS | Status: DC | PRN
  Administered 2017-10-04: 1 via TOPICAL
  Filled 2017-10-03: qty 56

## 2017-10-03 MED ORDER — DIPHENHYDRAMINE HCL 50 MG/ML IJ SOLN: 12.5000 mg | INTRAMUSCULAR | Status: DC | PRN

## 2017-10-03 MED ORDER — LACTATED RINGERS IV SOLN: 500.0000 mL | INTRAVENOUS | Status: DC | PRN

## 2017-10-03 MED ORDER — LACTATED RINGERS IV SOLN: 500.0000 mL | Freq: Once | INTRAVENOUS | Status: DC

## 2017-10-03 MED ORDER — PENICILLIN G POT IN DEXTROSE 60000 UNIT/ML IV SOLN
3.0000 10*6.[IU] | INTRAVENOUS | Status: DC
  Administered 2017-10-03: 3 10*6.[IU] via INTRAVENOUS
  Filled 2017-10-03 (×8): qty 50

## 2017-10-03 MED ORDER — LACTATED RINGERS IV SOLN: INTRAVENOUS | Status: DC

## 2017-10-03 MED ORDER — FENTANYL 2.5 MCG/ML W/ROPIVACAINE 0.15% IN NS 100 ML EPIDURAL (ARMC)
EPIDURAL | Status: AC
  Filled 2017-10-03: qty 100

## 2017-10-03 MED ORDER — OXYTOCIN 40 UNITS IN LACTATED RINGERS INFUSION - SIMPLE MED: 2.5000 [IU]/h | INTRAVENOUS | Status: DC

## 2017-10-03 MED ORDER — OXYTOCIN 10 UNIT/ML IJ SOLN
INTRAMUSCULAR | Status: AC
  Filled 2017-10-03: qty 2

## 2017-10-03 NOTE — OB Triage Note (Signed)
Pt presents c/o clear LOF since 3am. She also reports a little bit of bleeding "dark red" in color. Pt also reports ctx as well. Vitals WNL. Will continue to monitor.

## 2017-10-03 NOTE — OB Triage Note (Signed)
Nitazine test positive

## 2017-10-03 NOTE — Anesthesia Procedure Notes (Signed)
Epidural Patient location during procedure: OB Start time: 10/03/2017 6:08 PM End time: 10/03/2017 6:31 PM  Staffing Performed: anesthesiologist   Preanesthetic Checklist Completed: patient identified, site marked, surgical consent, pre-op evaluation, timeout performed, IV checked, risks and benefits discussed and monitors and equipment checked  Epidural Patient position: sitting Prep: Betadine Patient monitoring: heart rate, continuous pulse ox and blood pressure Approach: midline Location: L4-L5 Injection technique: LOR saline  Needle:  Needle type: Tuohy  Needle gauge: 17 G Needle length: 9 cm and 9 Needle insertion depth: 9 cm Catheter type: closed end flexible Catheter size: 20 Guage Catheter at skin depth: 13 cm Test dose: negative and 1.5% lidocaine with Epi 1:200 K  Assessment Events: blood not aspirated, injection not painful, no injection resistance, negative IV test and no paresthesia  Additional Notes   Patient tolerated the insertion well without complications.Reason for block:procedure for pain

## 2017-10-03 NOTE — Discharge Summary (Signed)
OB Discharge Summary     Patient Name: Shelby Obrien DOB: 02-04-1991 MRN: [id number]  Date of admission: 10/03/2017 Delivering MD: Thomasene Mohair, MD  Date of Delivery: 10/03/2017  Date of discharge: 10/05/2017 Admitting diagnosis: 38 wks preg contractions leaking fluid Intrauterine pregnancy: [redacted]w[redacted]d     Secondary diagnosis: None     Discharge diagnosis: Term Pregnancy Delivered                                                                                                Post partum procedures:none  Augmentation: Pitocin  Complications: None  Hospital course:  Onset of Labor With Vaginal Delivery     27 y.o. yo G3P1011 at [redacted]w[redacted]d was admitted in Latent Labor on 10/03/2017. Patient had an uncomplicated labor course as follows:  Membrane Rupture Time/Date: 3:00 AM ,10/03/2017   Intrapartum Procedures: Episiotomy: None [1]                                         Lacerations:  1st degree [2];Vaginal [6]  Patient had a delivery of a Viable infant. 10/03/2017  Information for the patient's newborn:  Pocahontas, Cohenour [[id number]]  Delivery Method: Vag-Spont    Pateint had an uncomplicated postpartum course.  She is ambulating, tolerating a regular diet, passing flatus, and urinating well. Patient is discharged home in stable condition on 10/05/17.   Physical exam  Vitals:   10/04/17 1100 10/04/17 2000 10/04/17 2346 10/05/17 0738  BP: 114/78 119/68 116/69 119/81  Pulse: 84 72 70 65  Resp: 18 18 20 18   Temp: 98 F (36.7 C) 98.1 F (36.7 C) 98.2 F (36.8 C) 97.9 F (36.6 C)  TempSrc: Oral Oral Oral Oral  SpO2:  98% 99% 100%  Weight:      Height:       General: alert, cooperative and no distress; cramping less than yesterday-mostly when breast feeding Lochia: appropriate Uterine Fundus: firm at U-1/ML/NT Incision: NA DVT Evaluation: No evidence of DVT seen on physical exam.  Labs: Lab Results  Component Value Date   WBC 7.8 10/04/2017   HGB 11.7 (L) 10/04/2017   HCT 33.4 (L) 10/04/2017   MCV 86.3 10/04/2017   PLT 168 10/04/2017    Discharge instruction: per After Visit Summary.  Medications:  Allergies as of 10/05/2017      Reactions   Shellfish Allergy Anaphylaxis   Other    BLUEBERRIES, BANANAS, TREE NUTS, RASPBERRIES, GRAPES, POLLENS/SEASONAL.      Medication List    STOP taking these medications   ALPRAZolam 0.5 MG tablet Commonly known as:  XANAX   ferrous sulfate 325 (65 FE) MG tablet Commonly known as:  FERROUSUL   folic acid 1 MG tablet Commonly known as:  FOLVITE   simethicone 80 MG chewable tablet Commonly known as:  MYLICON     TAKE these medications   albuterol 108 (90 Base) MCG/ACT inhaler Commonly known as:  PROVENTIL HFA;VENTOLIN HFA Inhale 2 puffs into the lungs every 4 (four) hours as needed  for wheezing or shortness of breath.   docusate sodium 100 MG capsule Commonly known as:  COLACE Take 1 capsule (100 mg total) by mouth 2 (two) times daily as needed.   HYDROcodone-acetaminophen 5-325 MG tablet Commonly known as:  NORCO/VICODIN Take 1 tablet by mouth every 6 (six) hours as needed for up to 5 days (breakthrough pain).   ibuprofen 600 MG tablet Commonly known as:  ADVIL,MOTRIN Take 1 tablet (600 mg total) by mouth every 6 (six) hours as needed.   omeprazole 20 MG capsule Commonly known as:  PRILOSEC Take 1 capsule (20 mg total) by mouth daily as needed (heartburn). What changed:    when to take this  reasons to take this   prenatal multivitamin Tabs tablet Take 1 tablet by mouth daily at 12 noon.       Diet: routine diet  Activity: Advance as tolerated. Pelvic rest for 6 weeks.   Outpatient follow up: Follow-up Information    Conard NovakJackson, Stephen D, MD Follow up in 2 week(s).   Specialty:  Obstetrics and Gynecology Why:  postpartum mood check Contact information: [address] [place] [address] [phone]             Postpartum contraception: undecided while breast  feeding, Nuvaring when done breast feeding Rhogam Given postpartum: no Rubella vaccine given postpartum: no Varicella vaccine given postpartum: no TDaP given antepartum or postpartum: 08/07/17 Influenza vaccine: 04/10/2017  Newborn Data: Live born female / Asher MuirJamie Birth Weight:6#11.9oz  APGAR: 8, 9  Newborn Delivery   Birth date/time:  10/03/2017 19:16:00 Delivery type:  Vaginal, Spontaneous    Baby Feeding: Breast  Disposition:home with mother  SIGNED: Farrel Connersolleen Asianna Brundage, CNM

## 2017-10-03 NOTE — H&P (Signed)
OB History & Physical   History of Present Illness:  Chief Complaint: my water broke  HPI:  Shelby Obrien is a 27 y.o. G59P1011 female at [redacted]w[redacted]d dated by LMP consistent with a 7 week ultrasound.  Her pregnancy has been complicated by obesity with BMI > 40.Marland Kitchen    She reports contractions that she is not timing.   She reports leakage of fluid.  She had a large gush of clear fluid at about 0300 today. She has continued to leak clearfluid   She denies vaginal bleeding.   She reports fetal movement.    Maternal Medical History:   Past Medical History:  Diagnosis Date  . Asthma   . Migraines   . Obesity    BMI>40    Past Surgical History:  Procedure Laterality Date  . WISDOM TOOTH EXTRACTION      Allergies  Allergen Reactions  . Shellfish Allergy Anaphylaxis  . Other     BLUEBERRIES, BANANAS, TREE NUTS, RASPBERRIES, GRAPES, POLLENS/SEASONAL.    Prior to Admission medications   Medication Sig Start Date End Date Taking? Authorizing Provider  folic acid (FOLVITE) 1 MG tablet Take 1 tablet (1 mg total) by mouth daily. 04/05/17  Yes Schuman, Christanna R, MD  omeprazole (PRILOSEC) 20 MG capsule Take 1 capsule (20 mg total) by mouth daily. 06/26/17  Yes Vena Austria, MD  simethicone (MYLICON) 80 MG chewable tablet Chew 1 tablet (80 mg total) by mouth every 6 (six) hours as needed for flatulence. 07/15/17  Yes Oswaldo Conroy, CNM  albuterol (PROVENTIL HFA;VENTOLIN HFA) 108 (90 Base) MCG/ACT inhaler Inhale 2 puffs into the lungs every 4 (four) hours as needed for wheezing or shortness of breath. 10/07/16   Nelwyn Salisbury, MD  ALPRAZolam Prudy Feeler) 0.5 MG tablet Take 1 tablet (0.5 mg total) by mouth 2 (two) times daily as needed for anxiety. 07/31/17   Schuman, Jaquelyn Bitter, MD  docusate sodium (COLACE) 100 MG capsule Take 1 capsule (100 mg total) by mouth 2 (two) times daily as needed. Patient not taking: Reported on 09/23/2017 07/15/17   Oswaldo Conroy, CNM  ferrous sulfate  (FERROUSUL) 325 (65 FE) MG tablet Take 1 tablet (325 mg total) by mouth daily. Patient not taking: Reported on 10/03/2017 07/28/17   Vena Austria, MD    OB History  Gravida Para Term Preterm AB Living  3 1 1   1 1   SAB TAB Ectopic Multiple Live Births  1       1    # Outcome Date GA Lbr Len/2nd Weight Sex Delivery Anes PTL Lv  3 Current           2 Term 04/09/14 [redacted]w[redacted]d  7 lb 5 oz (3.317 kg) F  EPI N LIV  1 SAB             Prenatal care site: [facility]  Social History: She  reports that she has quit smoking. She smoked 0.00 packs per day. She has never used smokeless tobacco. She reports that she does not drink alcohol or use drugs.  Family History: family history includes Cervical cancer in her mother; Diabetes in her maternal grandmother, paternal grandmother, and paternal uncle; Heart disease in her maternal grandmother and paternal grandmother.   Review of Systems: Negative x 10 systems reviewed except as noted in the HPI.    Physical Exam:  Vital Signs: BP 121/70 (BP Location: Left Arm)   Pulse 76   Temp 97.9 F (36.6 C) (Oral)  Resp 16   Ht 5\' 4"  (1.626 m)   Wt 252 lb (114.3 kg)   LMP 01/07/2017   SpO2 100%   BMI 43.26 kg/m  Constitutional: Well nourished, well developed female in no acute distress.  HEENT: normal Skin: Warm and dry.  Cardiovascular: Regular rate and rhythm.   Extremity: no edema  Respiratory: Clear to auscultation bilateral. Normal respiratory effort Abdomen: FHT present and gravid, NT Back: no CVAT Neuro: DTRs 2+, Cranial nerves grossly intact Psych: Alert and Oriented x3. No memory deficits. Normal mood and affect.  MS: normal gait, normal bilateral lower extremity ROM/strength/stability.  Pelvic exam: 4 cm per RN (minimal change since admission)   Pertinent Results:  Prenatal Labs: Blood type/Rh A positive  Antibody screen negative  Rubella Immune  Varicella Immune    RPR NR  HBsAg negative  HIV negative  GC negative    Chlamydia negative  Genetic screening Negative, no msAFP  1 hour GTT Early 86, 28 week 101  3 hour GTT n/a  GBS positive on 5/24   Baseline FHR: 130 beats/min   Variability: moderate   Accelerations: present   Decelerations: absent Contractions: present frequency: not tracing well, but ~ 1 q 8 minutes Overall assessment: cat 1  Assessment:  Shelby Obrien is a 27 y.o. 293P1011 female at 6863w3d with SROM.   Plan:  1. Admit to Labor & Delivery  2. CBC, T&S, Clrs, IVF 3. GBS positive: PCN 4. Fetwal well-being: reassuring overall 5. Will allow for spontaneous labor. Based on my discussion with patient, I recommended pitocin. She would like to wait. Will wait until 3 pm (12 hours after SROM). If not making progress, will augment.   6. Discussed pain management options during labor. She is unsure of what she will utilize at this time.   Thomasene MohairStephen Kodie Kishi, MD 10/03/2017 1:25 PM

## 2017-10-03 NOTE — Progress Notes (Addendum)
Pt sitting comfortably on birthing ball with irregular ctx rating 7/10. SVE 4/70/-3. Afebrile (97.10F) Educated pt that most people will go into labor spontaneously within 24hr ROM however that there is also an increased risk of infection after rupture and MD may recommend augmentation with pitocin to accelerate labor. Informed pt that MD plans to come discuss options with pt and will make plan via shared decision-making.

## 2017-10-03 NOTE — Anesthesia Preprocedure Evaluation (Signed)
Anesthesia Evaluation  Patient identified by MRN, date of birth, ID band Patient awake    Reviewed: Allergy & Precautions, NPO status , Patient's Chart, lab work & pertinent test results  History of Anesthesia Complications Negative for: history of anesthetic complications  Airway Mallampati: III       Dental   Pulmonary asthma , neg sleep apnea, neg COPD, former smoker,           Cardiovascular (-) hypertension(-) Past MI and (-) CHF (-) dysrhythmias (-) Valvular Problems/Murmurs     Neuro/Psych neg Seizures Anxiety    GI/Hepatic Neg liver ROS, neg GERD  ,  Endo/Other  neg diabetesMorbid obesity  Renal/GU negative Renal ROS     Musculoskeletal   Abdominal   Peds  Hematology   Anesthesia Other Findings   Reproductive/Obstetrics                             Anesthesia Physical Anesthesia Plan  ASA: II  Anesthesia Plan: Epidural   Post-op Pain Management:    Induction:   PONV Risk Score and Plan:   Airway Management Planned:   Additional Equipment:   Intra-op Plan:   Post-operative Plan:   Informed Consent: I have reviewed the patients History and Physical, chart, labs and discussed the procedure including the risks, benefits and alternatives for the proposed anesthesia with the patient or authorized representative who has indicated his/her understanding and acceptance.     Plan Discussed with:   Anesthesia Plan Comments:         Anesthesia Quick Evaluation

## 2017-10-04 ENCOUNTER — Encounter: Payer: Self-pay | Admitting: *Deleted

## 2017-10-04 LAB — CBC
HEMATOCRIT: 33.4 % — AB (ref 35.0–47.0)
HEMOGLOBIN: 11.7 g/dL — AB (ref 12.0–16.0)
MCH: 30.3 pg (ref 26.0–34.0)
MCHC: 35.1 g/dL (ref 32.0–36.0)
MCV: 86.3 fL (ref 80.0–100.0)
Platelets: 168 10*3/uL (ref 150–440)
RBC: 3.87 MIL/uL (ref 3.80–5.20)
RDW: 14.8 % — ABNORMAL HIGH (ref 11.5–14.5)
WBC: 7.8 10*3/uL (ref 3.6–11.0)

## 2017-10-04 LAB — RPR: RPR Ser Ql: NONREACTIVE

## 2017-10-04 NOTE — Anesthesia Postprocedure Evaluation (Signed)
Anesthesia Post Note  Patient: [name]  Procedure(s) Performed: AN AD HOC LABOR EPIDURAL  Patient location during evaluation: Mother Baby Anesthesia Type: Epidural Level of consciousness: awake and alert Pain management: pain level controlled Vital Signs Assessment: post-procedure vital signs reviewed and stable Respiratory status: spontaneous breathing, nonlabored ventilation and respiratory function stable Cardiovascular status: stable Postop Assessment: no headache, no backache and epidural receding Anesthetic complications: no     Last Vitals:  Vitals:   10/04/17 0734 10/04/17 1100  BP: 118/74 114/78  Pulse: 76 84  Resp: 20 18  Temp: 36.8 C 36.7 C  SpO2: 98%     Last Pain:  Vitals:   10/04/17 1208  TempSrc:   PainSc: 0-No pain                 KEPHART,WILLIAM K

## 2017-10-04 NOTE — Progress Notes (Signed)
Patient ID: Levora DredgeJasmine Monique Noguchi, female   DOB: 1991-04-13, 27 y.o.   MRN: [id number]  Obstetric Postpartum Daily Progress Note Subjective:  27 y.o. N8G9562G3P2012 postpartum day #1 status post vaginal delivery.  She is ambulating, is tolerating po, is voiding spontaneously.  Her pain is well controlled on PO pain medications. Her lochia is equal to menses.   Medications SCHEDULED MEDICATIONS  . ferrous sulfate  325 mg Oral BID WC  . ibuprofen  600 mg Oral Q6H  . pantoprazole  40 mg Oral Daily  . prenatal multivitamin  1 tablet Oral Q1200  . senna-docusate  2 tablet Oral Q24H    MEDICATION INFUSIONS    PRN MEDICATIONS  acetaminophen, benzocaine-Menthol, coconut oil, witch hazel-glycerin **AND** dibucaine, diphenhydrAMINE, HYDROcodone-acetaminophen, ondansetron **OR** ondansetron (ZOFRAN) IV, simethicone    Objective:   Vitals:   10/03/17 2309 10/04/17 0014 10/04/17 0426 10/04/17 0734  BP: 127/80 107/74 115/78 118/74  Pulse: 74 73 70 76  Resp: 20 18 16 20   Temp: 98.2 F (36.8 C) 98.2 F (36.8 C) 97.8 F (36.6 C) 98.3 F (36.8 C)  TempSrc: Oral Oral Oral Oral  SpO2: 99% 99% 99% 98%  Weight:      Height:        Current Vital Signs 24h Vital Sign Ranges  T 98.3 F (36.8 C) Temp  Avg: 98.1 F (36.7 C)  Min: 97.8 F (36.6 C)  Max: 98.3 F (36.8 C)  BP 118/74 BP  Min: 100/66  Max: 127/80  HR 76 Pulse  Avg: 76.2  Min: 54  Max: 93  RR 20 Resp  Avg: 18  Min: 16  Max: 20  SaO2 98 % Room Air SpO2  Avg: 99.2 %  Min: 98 %  Max: 100 %       24 Hour I/O Current Shift I/O  Time Ins Outs 06/08 0701 - 06/09 0700 In: -  Out: 1950 [Urine:1700] No intake/output data recorded.  General: NAD Pulmonary: no increased work of breathing Abdomen: non-distended, non-tender, fundus firm at level of umbilicus Extremities: no edema, no erythema, no tenderness  Labs:  Recent Labs  Lab 10/03/17 1058 10/04/17 0525  WBC 6.4 7.8  HGB 11.5* 11.7*  HCT 33.5* 33.4*  PLT 177 168     Assessment:    27 y.o. [id number] postpartum day # 1 status post SVD  Plan:   1) Acute blood loss anemia - hemodynamically stable and asymptomatic - po ferrous sulfate  2) A POS / Rubella 4.81 (11/08 1458)/ Varicella Immune  3) TDAP status up to date  4) breast feeding /Contraception = undecided  5) Disposition: home tomorrow  Thomasene MohairStephen Bodi Palmeri, MD 10/04/2017 10:31 AM

## 2017-10-04 NOTE — Plan of Care (Signed)
Alert and oriented with aprop. Affect. Color good, skin w&d. BBS clear. Afeb. VSS. Fundus is firm at U/-1 and small amount of Rubra Lochia. Denies Breat pain. C/O cramping with Breast Feeding and states relief with scheduled Ibuprofen and PRN Norco. Appears to be bonding well with Infant. Sign. Other is supportive and at Bedside.

## 2017-10-05 MED ORDER — IBUPROFEN 600 MG PO TABS: 600.0000 mg | ORAL_TABLET | Freq: Four times a day (QID) | ORAL | 1 refills | Status: DC | PRN

## 2017-10-05 MED ORDER — HYDROCODONE-ACETAMINOPHEN 5-325 MG PO TABS: 1.0000 | ORAL_TABLET | Freq: Four times a day (QID) | ORAL | 0 refills | Status: AC | PRN

## 2017-10-05 MED ORDER — PRENATAL MULTIVITAMIN CH: 1.0000 | ORAL_TABLET | Freq: Every day | ORAL | Status: DC

## 2017-10-05 MED ORDER — OMEPRAZOLE 20 MG PO CPDR: 20.0000 mg | DELAYED_RELEASE_CAPSULE | Freq: Every day | ORAL | 1 refills | Status: DC | PRN

## 2017-10-05 NOTE — Lactation Note (Signed)
This note was copied from a baby's chart. Lactation Consultation Note  Patient Name: Shelby Obrien WUJWJ'XToday's Date: 10/05/2017 Reason for consult: Term;Other (Comment) Mom getting ready for discharge.  Observed good breast feeding in football hold.  Mom praised for only breast feeding.  Can easily hand express colostrum.  Mom does not feel mature milk is in yet.  Breasts feel slightly fuller than yesterday.  Mom asking about pumping for Dad to help with a feeding now and then at night.  Reviewed supply and demand and need to frequently breast feed to bring in mature milk and ensure a plentiful milk supply.  Discussed pumping to store up milk for a later date after mature milk is in and established to meet [name]'s needs.  Discussed full breasts, engorgement and mastitis and how to prevent and resolve any issues.  Mom reports Shelby Obrien clusterfeeding last night and caused some sore nipples.  No trauma noted to nipples.  Mom already has coconut oil.  Comfort gels given and explained in how to alternate use.  Community resources and support group discussed.  Lactation name and number given and encouraged to call with any questions or concerns.  Maternal Data Formula Feeding for Exclusion: No Has patient been taught Hand Expression?: Yes Does the patient have breastfeeding experience prior to this delivery?: Yes  Feeding Feeding Type: Breast Fed Length of feed: 25 min  LATCH Score Latch: Grasps breast easily, tongue down, lips flanged, rhythmical sucking.  Audible Swallowing: A few with stimulation  Type of Nipple: Everted at rest and after stimulation  Comfort (Breast/Nipple): Filling, red/small blisters or bruises, mild/mod discomfort  Hold (Positioning): No assistance needed to correctly position infant at breast.  LATCH Score: 8  Interventions Interventions: Comfort gels  Lactation Tools Discussed/Used Tools: Coconut oil;Comfort gels WIC Program: Yes   Consult Status Consult  Status: PRN Follow-up type: Call as needed    Louis MeckelWilliams, Samadhi Mahurin Kay 10/05/2017, 10:22 AM

## 2017-10-05 NOTE — Progress Notes (Signed)
Pt discharged with infant.  Discharge instructions, prescriptions and follow up appointment given to and reviewed with pt. Pt verbalized understanding. Escorted out by auxillary. 

## 2017-10-19 ENCOUNTER — Encounter: Payer: Self-pay | Admitting: Obstetrics and Gynecology

## 2017-10-19 ENCOUNTER — Ambulatory Visit (INDEPENDENT_AMBULATORY_CARE_PROVIDER_SITE_OTHER): Payer: BLUE CROSS/BLUE SHIELD | Admitting: Obstetrics and Gynecology

## 2017-10-19 DIAGNOSIS — Z9889 Other specified postprocedural states: Secondary | ICD-10-CM

## 2017-10-19 NOTE — Progress Notes (Signed)
Obstetrics & Gynecology Office Visit   Chief Complaint  Patient presents with  . Postpartum Care    History of Present Illness: 27 y.o. 953P2012 female who is postpartum day #16 from an uncomplicated SVD. She presents for a postpartum mood check given her history of anxiety and depression, which put her at higher risk of postpartum depression. She denies depression and anxiety today. Her EPDS is 8 and she states that she is coping well overall. She has not acute needs today.    Past Medical History:  Diagnosis Date  . Asthma   . Migraines   . Obesity    BMI>40    Past Surgical History:  Procedure Laterality Date  . WISDOM TOOTH EXTRACTION      Gynecologic History: No LMP recorded.  Obstetric History: [id number]  Family History  Problem Relation Age of Onset  . Cervical cancer Mother   . Diabetes Maternal Grandmother   . Heart disease Maternal Grandmother   . Diabetes Paternal Grandmother   . Heart disease Paternal Grandmother   . Diabetes Paternal Uncle     Social History   Socioeconomic History  . Marital status: Single    Spouse name: Not on file  . Number of children: 1  . Years of education: Not on file  . Highest education level: Not on file  Occupational History  . Not on file  Social Needs  . Financial resource strain: Not on file  . Food insecurity:    Worry: Not on file    Inability: Not on file  . Transportation needs:    Medical: Not on file    Non-medical: Not on file  Tobacco Use  . Smoking status: Former Smoker    Packs/day: 0.00  . Smokeless tobacco: Never Used  Substance and Sexual Activity  . Alcohol use: No  . Drug use: No  . Sexual activity: Yes    Birth control/protection: None  Lifestyle  . Physical activity:    Days per week: Not on file    Minutes per session: Not on file  . Stress: Not on file  Relationships  . Social connections:    Talks on phone: Not on file    Gets together: Not on file    Attends religious service:  Not on file    Active member of club or organization: Not on file    Attends meetings of clubs or organizations: Not on file    Relationship status: Not on file  . Intimate partner violence:    Fear of current or ex partner: Not on file    Emotionally abused: Not on file    Physically abused: Not on file    Forced sexual activity: Not on file  Other Topics Concern  . Not on file  Social History Narrative  . Not on file    Allergies  Allergen Reactions  . Shellfish Allergy Anaphylaxis  . Other     BLUEBERRIES, BANANAS, TREE NUTS, RASPBERRIES, GRAPES, POLLENS/SEASONAL.    Prior to Admission medications   Medication Sig Start Date End Date Taking? Authorizing Provider  albuterol (PROVENTIL HFA;VENTOLIN HFA) 108 (90 Base) MCG/ACT inhaler Inhale 2 puffs into the lungs every 4 (four) hours as needed for wheezing or shortness of breath. 10/07/16  Yes Nelwyn SalisburyFry, Stephen A, MD  docusate sodium (COLACE) 100 MG capsule Take 1 capsule (100 mg total) by mouth 2 (two) times daily as needed. 07/15/17  Yes Oswaldo ConroySchmid, Jacelyn Y, CNM  ibuprofen (ADVIL,MOTRIN) 600 MG tablet Take 1  tablet (600 mg total) by mouth every 6 (six) hours as needed. 10/05/17  Yes Farrel Conners, CNM  omeprazole (PRILOSEC) 20 MG capsule Take 1 capsule (20 mg total) by mouth daily as needed (heartburn). 10/05/17  Yes Farrel Conners, CNM  Prenatal Vit-Fe Fumarate-FA (PRENATAL MULTIVITAMIN) TABS tablet Take 1 tablet by mouth daily at 12 noon. Patient not taking: Reported on 10/19/2017 10/05/17   Farrel Conners, CNM    Review of Systems  Constitutional: Negative.   HENT: Negative.   Eyes: Negative.   Respiratory: Negative.   Cardiovascular: Negative.   Gastrointestinal: Negative.   Genitourinary: Negative.   Musculoskeletal: Negative.   Skin: Negative.   Neurological: Negative.   Psychiatric/Behavioral: Negative.      Physical Exam BP 116/80 (BP Location: Left Arm, Patient Position: Sitting, Cuff Size: Large)   Pulse  (!) 54   Ht 5\' 4"  (1.626 m)   Wt 246 lb (111.6 kg)   SpO2 98%   BMI 42.23 kg/m  No LMP recorded. Physical Exam  Constitutional: She is oriented to person, place, and time. She appears well-developed and well-nourished. No distress.  HENT:  Head: Normocephalic and atraumatic.  Eyes: Conjunctivae are normal. No scleral icterus.  Cardiovascular: Normal rate and regular rhythm.  Pulmonary/Chest: Effort normal and breath sounds normal.  Abdominal: Soft. Bowel sounds are normal.  Musculoskeletal: Normal range of motion. She exhibits no edema.  Neurological: She is alert and oriented to person, place, and time. No cranial nerve deficit.  Psychiatric: She has a normal mood and affect. Her behavior is normal. Judgment normal.   Edinburgh Postnatal Depression Scale - 10/19/17 1945      Edinburgh Postnatal Depression Scale:  In the Past 7 Days   I have been able to laugh and see the funny side of things.  0    I have looked forward with enjoyment to things.  1    I have blamed myself unnecessarily when things went wrong.  2    I have been anxious or worried for no good reason.  2    I have felt scared or panicky for no good reason.  1    Things have been getting on top of me.  1    I have been so unhappy that I have had difficulty sleeping.  0    I have felt sad or miserable.  0    I have been so unhappy that I have been crying.  1    The thought of harming myself has occurred to me.  0    Edinburgh Postnatal Depression Scale Total  8      Assessment: 27 y.o. B1Y7829 female here for  1. Postpartum care and examination      Plan: Problem List Items Addressed This Visit    None    Visit Diagnoses    Postpartum care and examination    -  Primary     No evidence of postpartum depression or anxiety.  Continue to monitor at home. Otherwise, follow up for 6 week visit.  Return in about 1 month (around 11/16/2017) for Six Week Postpartum.   Thomasene Mohair, MD 10/19/2017 7:49 PM

## 2017-11-03 ENCOUNTER — Ambulatory Visit: Payer: Medicaid Other | Admitting: Neurology

## 2017-11-16 ENCOUNTER — Encounter: Payer: Self-pay | Admitting: Obstetrics and Gynecology

## 2017-11-16 ENCOUNTER — Ambulatory Visit (INDEPENDENT_AMBULATORY_CARE_PROVIDER_SITE_OTHER): Payer: Medicaid Other | Admitting: Obstetrics and Gynecology

## 2017-11-16 DIAGNOSIS — Z30019 Encounter for initial prescription of contraceptives, unspecified: Secondary | ICD-10-CM

## 2017-11-16 MED ORDER — ETONOGESTREL-ETHINYL ESTRADIOL 0.12-0.015 MG/24HR VA RING: VAGINAL_RING | VAGINAL | 4 refills | Status: DC

## 2017-11-16 NOTE — Progress Notes (Signed)
Postpartum Visit   Chief Complaint  Patient presents with  . Postpartum Care    History of Present Illness: Patient is a 27 y.o. N8G9562 presents for postpartum visit.  Date of delivery: 10/03/17 Type of delivery: Vaginal delivery  Episiotomy No.  Laceration: yes, 1st degree vaginal Pregnancy or labor problems:  Obesity with BMI >40 Any problems since the delivery:  no  Newborn Details:  SINGLETON :  1. Baby's name: Asher Muir. Birth weight: 6 lb 11.9 oz  Maternal Details:  Breast Feeding:  no Post partum depression/anxiety noted:  no Edinburgh Post-Partum Depression Score:  5  Date of last P7/2/18AP: NILM    Past Medical History:  Diagnosis Date  . Asthma   . Migraines   . Obesity    BMI>40    Past Surgical History:  Procedure Laterality Date  . WISDOM TOOTH EXTRACTION      Medications   Medication Sig Start Date End Date Taking? Authorizing Provider  albuterol (PROVENTIL HFA;VENTOLIN HFA) 108 (90 Base) MCG/ACT inhaler Inhale 2 puffs into the lungs every 4 (four) hours as needed for wheezing or shortness of breath. 10/07/16  Yes Nelwyn Salisbury, MD  docusate sodium (COLACE) 100 MG capsule Take 1 capsule (100 mg total) by mouth 2 (two) times daily as needed. 07/15/17  Yes Oswaldo Conroy, CNM  ibuprofen (ADVIL,MOTRIN) 600 MG tablet Take 1 tablet (600 mg total) by mouth every 6 (six) hours as needed. 10/05/17  Yes Farrel Conners, CNM  omeprazole (PRILOSEC) 20 MG capsule Take 1 capsule (20 mg total) by mouth daily as needed (heartburn). 10/05/17  Yes Farrel Conners, CNM    Allergies  Allergen Reactions  . Shellfish Allergy Anaphylaxis  . Other     BLUEBERRIES, BANANAS, TREE NUTS, RASPBERRIES, GRAPES, POLLENS/SEASONAL.     Social History   Socioeconomic History  . Marital status: Single    Spouse name: Not on file  . Number of children: 1  . Years of education: Not on file  . Highest education level: Not on file  Occupational History  . Not on file  Social  Needs  . Financial resource strain: Not on file  . Food insecurity:    Worry: Not on file    Inability: Not on file  . Transportation needs:    Medical: Not on file    Non-medical: Not on file  Tobacco Use  . Smoking status: Former Smoker    Packs/day: 0.00  . Smokeless tobacco: Never Used  Substance and Sexual Activity  . Alcohol use: No  . Drug use: No  . Sexual activity: Yes    Birth control/protection: None  Lifestyle  . Physical activity:    Days per week: Not on file    Minutes per session: Not on file  . Stress: Not on file  Relationships  . Social connections:    Talks on phone: Not on file    Gets together: Not on file    Attends religious service: Not on file    Active member of club or organization: Not on file    Attends meetings of clubs or organizations: Not on file    Relationship status: Not on file  . Intimate partner violence:    Fear of current or ex partner: Not on file    Emotionally abused: Not on file    Physically abused: Not on file    Forced sexual activity: Not on file  Other Topics Concern  . Not on file  Social History Narrative  .  Not on file    Family History  Problem Relation Age of Onset  . Cervical cancer Mother   . Diabetes Maternal Grandmother   . Heart disease Maternal Grandmother   . Diabetes Paternal Grandmother   . Heart disease Paternal Grandmother   . Diabetes Paternal Uncle     Review of Systems  Constitutional: Negative.   HENT: Negative.   Eyes: Negative.   Respiratory: Negative.   Cardiovascular: Negative.   Gastrointestinal: Negative.   Genitourinary: Negative.   Musculoskeletal: Negative.   Skin: Negative.   Neurological: Negative.   Psychiatric/Behavioral: Negative.      Physical Exam BP (!) 144/94 (BP Location: Left Arm, Patient Position: Sitting, Cuff Size: Large)   Pulse 94   Ht 5\' 4"  (1.626 m)   Wt 248 lb (112.5 kg)   LMP 11/10/2017 (Exact Date)   SpO2 99%   BMI 42.57 kg/m   Physical Exam    Constitutional: She is oriented to person, place, and time. She appears well-developed and well-nourished.  HENT:  Head: Normocephalic and atraumatic.  Eyes: Conjunctivae are normal. No scleral icterus.  Cardiovascular: Normal rate and regular rhythm.  Pulmonary/Chest: Effort normal and breath sounds normal. No respiratory distress.  Abdominal: Soft. Bowel sounds are normal. She exhibits no distension and no mass. There is no tenderness. There is no rebound and no guarding.  Neurological: She is alert and oriented to person, place, and time. No cranial nerve deficit.  Skin: Skin is warm and dry. No erythema.  Psychiatric: She has a normal mood and affect. Her behavior is normal. Judgment normal.    Assessment: 27 y.o. O1H0865G3P2012 presenting for 6 week postpartum visit  Plan: Problem List Items Addressed This Visit    None    Visit Diagnoses    Postpartum care and examination    -  Primary   Encounter for initial prescription of contraceptives, unspecified contraceptive           1) Contraception Education given.  She has a history of migraine headaches, without aura based on her description.   She also had elevated blood pressure today with no other symptoms.  Discussed recommendation for progesterone-only contraception.  She would like to consider and let me know. She is considering IUD.  Discussed progesterone-only contraception in some detail.   2)  Pap - ASCCP guidelines and rational discussed.  Patient opts for routine screening interval  3) Patient underwent screening for postpartum depression with no concerns noted.  4) Follow up 1 year for routine annual exam  Thomasene MohairStephen Deejay Koppelman, MD 11/16/2017 11:32 AM

## 2017-12-31 NOTE — Progress Notes (Signed)
NEUROLOGY CONSULTATION NOTE  Kagan Hietpas MRN: [id number] DOB: 04-Mar-1991  Referring provider: Abbe Amsterdam, MD Primary care provider: Gershon Crane, MD  Reason for consult:  migraine  HISTORY OF PRESENT ILLNESS: Shelby Obrien is a 27 year old female with asthma, anxiety and migraines who presents for migraines.  History supplemented by PCP note.  Onset:  Early 14s Location:  Frontal/temporal either side Quality:  pounding Intensity:  10/10.  She denies new headache, thunderclap headache Aura:  no Prodrome:  no Postdrome:  tired Associated symptoms:  Nausea, vomiting, dizziness, photophobia, phonophobia, osmophobia, rarely sees black spots.  She denies associated autonomic symptoms or unilateral numbness or weakness. Duration:  3 days (lately 2 weeks) Frequency:  Every 2 weeks Frequency of abortive medication: only when needed Triggers:  No.  Not associated with menstrual period Exacerbating factors:  No Relieving factors:  Resting and sleep (sometimes) Activity:  Routine daily activity aggravates.  Current NSAIDS:  no Current analgesics:  Tylenol and Excedrin (ineffective) Current triptans:  no Current ergotamine:  no Current anti-emetic:  no Current muscle relaxants:  no Current anti-anxiolytic:  no Current sleep aide:  no Current Antihypertensive medications:  no Current Antidepressant medications:  no Current Anticonvulsant medications:  no Current anti-CGRP:  no Current Vitamins/Herbal/Supplements:  no Current Antihistamines/Decongestants:  no Other therapy:  no Other medication:  no  Past NSAIDS:  ibuprofen Past analgesics:  no Past abortive triptans:  Sumatriptan 100mg  (initially effective but stopped working) Past abortive ergotamine:  no Past muscle relaxants:  no Past anti-emetic:  no Past antihypertensive medications:  no Past antidepressant medications:  no Past anticonvulsant medications:  no Past anti-CGRP:  no Past  vitamins/Herbal/Supplements:  no Past antihistamines/decongestants:  no Other past therapies:  no  Caffeine:  1 cup coffee 3 times a week, Coke infrequent Alcohol:  no Smoker:  2 cigarettes a week (cut down) Diet:  2 bottles 12 oz water daily, juice Exercise:  no Depression:  no; Anxiety:  no Other pain:  Mild back pain Sleep hygiene:  Good. Family history of headache:  Mother, sister (migraines), paternal cousins  10/03/17 CBC with WBC 6.4, HGB 11.5, HCT 33.5, PLT 177, MCV 86.2  PAST MEDICAL HISTORY: Past Medical History:  Diagnosis Date  . Asthma   . Migraines   . Obesity    BMI>40    PAST SURGICAL HISTORY: Past Surgical History:  Procedure Laterality Date  . WISDOM TOOTH EXTRACTION      MEDICATIONS: Current Outpatient Medications on File Prior to Visit  Medication Sig Dispense Refill  . albuterol (PROVENTIL HFA;VENTOLIN HFA) 108 (90 Base) MCG/ACT inhaler Inhale 2 puffs into the lungs every 4 (four) hours as needed for wheezing or shortness of breath. 1 Inhaler 5  . docusate sodium (COLACE) 100 MG capsule Take 1 capsule (100 mg total) by mouth 2 (two) times daily as needed. 30 capsule 2  . ibuprofen (ADVIL,MOTRIN) 600 MG tablet Take 1 tablet (600 mg total) by mouth every 6 (six) hours as needed. 30 tablet 1  . omeprazole (PRILOSEC) 20 MG capsule Take 1 capsule (20 mg total) by mouth daily as needed (heartburn). 30 capsule 1   No current facility-administered medications on file prior to visit.     ALLERGIES: Allergies  Allergen Reactions  . Shellfish Allergy Anaphylaxis  . Other     BLUEBERRIES, BANANAS, TREE NUTS, RASPBERRIES, GRAPES, POLLENS/SEASONAL.    FAMILY HISTORY: Family History  Problem Relation Age of Onset  . Cervical cancer Mother   .  Diabetes Maternal Grandmother   . Heart disease Maternal Grandmother   . Diabetes Paternal Grandmother   . Heart disease Paternal Grandmother   . Diabetes Paternal Uncle    SOCIAL HISTORY: Social History    Socioeconomic History  . Marital status: Single    Spouse name: Not on file  . Number of children: 1  . Years of education: Not on file  . Highest education level: Not on file  Occupational History  . Not on file  Social Needs  . Financial resource strain: Not on file  . Food insecurity:    Worry: Not on file    Inability: Not on file  . Transportation needs:    Medical: Not on file    Non-medical: Not on file  Tobacco Use  . Smoking status: Former Smoker    Packs/day: 0.00  . Smokeless tobacco: Never Used  Substance and Sexual Activity  . Alcohol use: No  . Drug use: No  . Sexual activity: Yes    Birth control/protection: None  Lifestyle  . Physical activity:    Days per week: Not on file    Minutes per session: Not on file  . Stress: Not on file  Relationships  . Social connections:    Talks on phone: Not on file    Gets together: Not on file    Attends religious service: Not on file    Active member of club or organization: Not on file    Attends meetings of clubs or organizations: Not on file    Relationship status: Not on file  . Intimate partner violence:    Fear of current or ex partner: Not on file    Emotionally abused: Not on file    Physically abused: Not on file    Forced sexual activity: Not on file  Other Topics Concern  . Not on file  Social History Narrative  . Not on file    REVIEW OF SYSTEMS: Constitutional: No fevers, chills, or sweats, no generalized fatigue, change in appetite Eyes: No visual changes, double vision, eye pain Ear, nose and throat: No hearing loss, ear pain, nasal congestion, sore throat Cardiovascular: No chest pain, palpitations Respiratory:  No shortness of breath at rest or with exertion, wheezes GastrointestinaI: No nausea, vomiting, diarrhea, abdominal pain, fecal incontinence Genitourinary:  No dysuria, urinary retention or frequency Musculoskeletal:  No neck pain, back pain Integumentary: No rash, pruritus, skin  lesions Neurological: as above Psychiatric: No depression, insomnia, anxiety Endocrine: No palpitations, fatigue, diaphoresis, mood swings, change in appetite, change in weight, increased thirst Hematologic/Lymphatic:  No purpura, petechiae. Allergic/Immunologic: no itchy/runny eyes, nasal congestion, recent allergic reactions, rashes  PHYSICAL EXAM: Blood pressure 130/90, pulse 90, height 5\' 4"  (1.626 m), weight 251 lb 8 oz (114.1 kg), SpO2 97 %, unknown if currently breastfeeding. General: No acute distress.  Patient appears well-groomed.   Head:  Normocephalic/atraumatic Eyes:  fundi examined but not visualized Neck: supple, no paraspinal tenderness, full range of motion Back: No paraspinal tenderness Heart: regular rate and rhythm Lungs: Clear to auscultation bilaterally. Vascular: No carotid bruits. Neurological Exam: Mental status: alert and oriented to person, place, and time, recent and remote memory intact, fund of knowledge intact, attention and concentration intact, speech fluent and not dysarthric, language intact. Cranial nerves: CN I: not tested CN II: pupils equal, round and reactive to light, visual fields intact CN III, IV, VI:  full range of motion, no nystagmus, no ptosis CN V: facial sensation intact CN VII:  upper and lower face symmetric CN VIII: hearing intact CN IX, X: gag intact, uvula midline CN XI: sternocleidomastoid and trapezius muscles intact CN XII: tongue midline Bulk & Tone: normal, no fasciculations. Motor:  5/5 throughout  Sensation: temperature and vibration sensation intact.  Deep Tendon Reflexes:  2+ throughout, toes downgoing.   Finger to nose testing:  Without dysmetria.   Heel to shin:  Without dysmetria.   Gait:  Normal station and stride.  Able to turn and tandem walk. Romberg negative  IMPRESSION: Chronic migraine without aura, without status migrainosus, intractable Tobacco use disorder  PLAN: 1.  Start nortriptyline 25mg  at  bedtime.  If not improved in 4 weeks, we can increase to 50mg  at bedtime 2.  Rizatriptan 10mg  for abortive therapy 3.  Zofran 4mg  for nausea 4.  Limit use of pain relievers to no more than 2 days out of week to prevent risk of rebound or medication-overuse headache. 5.  Keep headache diary 6.  Tobacco cessation counseling (CPT 99406):  Tobacco use with no history of CAD, stroke, or cancer  - Currently smoking 2 cigarettes per week   - Patient was informed of the dangers of tobacco abuse including stroke, cancer, and MI, as well as benefits of tobacco cessation. - Patient is willing to quit at this time. - Approximately 5 mins were spent counseling patient cessation techniques. We discussed various methods to help quit smoking, including deciding on a date to quit, joining a support group, pharmacological agents- nicotine gum/patch/lozenges, chantix.  - I will reassess her progress at the next follow-up visit 7.  Follow up in 4 months.  Thank you for allowing me to take part in the care of this patient.  Shon Millet, DO  CC: Gershon Crane, MD

## 2018-01-01 ENCOUNTER — Encounter: Payer: Self-pay | Admitting: Neurology

## 2018-01-01 ENCOUNTER — Ambulatory Visit: Payer: Medicaid Other | Admitting: Neurology

## 2018-01-01 VITALS — BP 130/90 | HR 90 | Ht 64.0 in | Wt 251.5 lb

## 2018-01-01 DIAGNOSIS — F17200 Nicotine dependence, unspecified, uncomplicated: Secondary | ICD-10-CM | POA: Diagnosis not present

## 2018-01-01 DIAGNOSIS — G43719 Chronic migraine without aura, intractable, without status migrainosus: Secondary | ICD-10-CM | POA: Diagnosis not present

## 2018-01-01 MED ORDER — NORTRIPTYLINE HCL 25 MG PO CAPS: 25.0000 mg | ORAL_CAPSULE | Freq: Every day | ORAL | 3 refills | Status: DC

## 2018-01-01 MED ORDER — ONDANSETRON 4 MG PO TBDP: 4.0000 mg | ORAL_TABLET | Freq: Three times a day (TID) | ORAL | 3 refills | Status: DC | PRN

## 2018-01-01 MED ORDER — RIZATRIPTAN BENZOATE 10 MG PO TBDP: ORAL_TABLET | ORAL | 3 refills | Status: DC

## 2018-01-01 NOTE — Patient Instructions (Signed)
Migraine Recommendations: 1.  Start nortriptyline 25mg  at bedtime.  If headaches not improved in 4 weeks, then contact me with update and we can adjust dose if needed. 2.  Take rizatriptan 10mg  at earliest onset of headache.  May repeat dose once in 2 hours if needed.  Do not exceed two tablets in 24 hours.  For nausea, take ondansetron 4mg  as prescribed. 3.  Limit use of pain relievers to no more than 2 days out of the week.  These medications include acetaminophen, ibuprofen, triptans and narcotics.  This will help reduce risk of rebound headaches. 4.  Be aware of common food triggers such as processed sweets, processed foods with nitrites (such as deli meat, hot dogs, sausages), foods with MSG, alcohol (such as wine), chocolate, certain cheeses, certain fruits (dried fruits, bananas, some citrus fruit), vinegar, diet soda. 4.  Avoid caffeine 5.  Routine exercise 6.  Proper sleep hygiene 7.  Stay adequately hydrated with water 8.  Keep a headache diary. 9.  Maintain proper stress management. 10.  Do not skip meals. 11.  Consider supplements:  Magnesium citrate 400mg  to 600mg  daily, riboflavin 400mg , Coenzyme Q 10 100mg  three times daily 12.  Follow up in 4 months.

## 2018-03-24 ENCOUNTER — Ambulatory Visit: Payer: Medicaid Other | Admitting: Family Medicine

## 2018-03-29 ENCOUNTER — Ambulatory Visit: Payer: Self-pay | Admitting: Family Medicine

## 2018-03-29 ENCOUNTER — Ambulatory Visit: Payer: Self-pay | Admitting: *Deleted

## 2018-03-29 NOTE — Telephone Encounter (Signed)
Patient is calling with chest pain with her asthma- she ran out of medication last week and she has been working long hours and has a cough. She states the last time this happened -she had bronchitis. Appointment made for evaluation of lungs.  Reason for Disposition . [1] MILD difficulty breathing (e.g., minimal/no SOB at rest, SOB with walking, pulse <100) AND [2] NEW-onset or WORSE than normal    Patient has asthma and is out of medication- she woke up this morning with chest pain- last time she had bronchitis  Answer Assessment - Initial Assessment Questions 1. RESPIRATORY STATUS: "Describe your breathing?" (e.g., wheezing, shortness of breath, unable to speak, severe coughing)      Hurts to breath in 2. ONSET: "When did this breathing problem begin?"      Patient has had breathing issues since last week- used last of inhaler last week- she woke with chest pain this morning.  3. PATTERN "Does the difficult breathing come and go, or has it been constant since it started?"      Constant since started 4. SEVERITY: "How bad is your breathing?" (e.g., mild, moderate, severe)    - MILD: No SOB at rest, mild SOB with walking, speaks normally in sentences, can lay down, no retractions, pulse < 100.    - MODERATE: SOB at rest, SOB with minimal exertion and prefers to sit, cannot lie down flat, speaks in phrases, mild retractions, audible wheezing, pulse 100-120.    - SEVERE: Very SOB at rest, speaks in single words, struggling to breathe, sitting hunched forward, retractions, pulse > 120     Mild/ moderate 5. RECURRENT SYMPTOM: "Have you had difficulty breathing before?" If so, ask: "When was the last time?" and "What happened that time?"      Yes- been while- patient had bronchitis 6. CARDIAC HISTORY: "Do you have any history of heart disease?" (e.g., heart attack, angina, bypass surgery, angioplasty)      no 7. LUNG HISTORY: "Do you have any history of lung disease?"  (e.g., pulmonary embolus,  asthma, emphysema)     asthma 8. CAUSE: "What do you think is causing the breathing problem?"      Asthma- feels like someone is holding chest when she tries to breath in 9. OTHER SYMPTOMS: "Do you have any other symptoms? (e.g., dizziness, runny nose, cough, chest pain, fever)     Chest pain, cough 10. PREGNANCY: "Is there any chance you are pregnant?" "When was your last menstrual period?"       No, LMP- 03/25/18 11. TRAVEL: "Have you traveled out of the country in the last month?" (e.g., travel history, exposures)       no  Protocols used: BREATHING DIFFICULTY-A-AH

## 2018-03-29 NOTE — Telephone Encounter (Signed)
Noted  

## 2018-03-31 ENCOUNTER — Ambulatory Visit: Payer: Self-pay | Admitting: Family Medicine

## 2018-03-31 ENCOUNTER — Ambulatory Visit (INDEPENDENT_AMBULATORY_CARE_PROVIDER_SITE_OTHER): Payer: Self-pay | Admitting: Family Medicine

## 2018-03-31 VITALS — HR 73

## 2018-03-31 DIAGNOSIS — J209 Acute bronchitis, unspecified: Secondary | ICD-10-CM

## 2018-03-31 MED ORDER — PREDNISONE 10 MG PO TABS: ORAL_TABLET | ORAL | 0 refills | Status: DC

## 2018-03-31 MED ORDER — ALBUTEROL SULFATE HFA 108 (90 BASE) MCG/ACT IN AERS: 2.0000 | INHALATION_SPRAY | RESPIRATORY_TRACT | 2 refills | Status: DC | PRN

## 2018-03-31 NOTE — Progress Notes (Signed)
  Subjective:     Patient ID: Shelby Obrien, female   DOB: 1991-03-31, 27 y.o.   MRN: [id number]  HPI Patient seen with acute respiratory illness.  Onset 2 days ago.  She has a 9160-month-old and 28-year-old children who have had recent respiratory symptoms.  Patient does have history of intermittent asthma.  Requesting refill albuterol.  She has had some productive cough but no fever.  She quit smoking about a year ago.  She is not aware of any definite wheezing.  Mild shortness of breath.  No facial pain.  No sore throat  Past Medical History:  Diagnosis Date  . Asthma   . Migraines   . Obesity    BMI>40   Past Surgical History:  Procedure Laterality Date  . WISDOM TOOTH EXTRACTION      reports that she has quit smoking. She smoked 0.00 packs per day. She has never used smokeless tobacco. She reports that she does not drink alcohol or use drugs. family history includes Cervical cancer in her mother; Diabetes in her maternal grandmother, paternal grandmother, and paternal uncle; Heart disease in her maternal grandmother and paternal grandmother. Allergies  Allergen Reactions  . Shellfish Allergy Anaphylaxis  . Other     BLUEBERRIES, BANANAS, TREE NUTS, RASPBERRIES, GRAPES, POLLENS/SEASONAL.     Review of Systems  Constitutional: Negative for chills and fever.  HENT: Negative for congestion, ear pain and sore throat.   Respiratory: Positive for cough. Negative for shortness of breath.        Objective:   Physical Exam  Constitutional: She appears well-developed and well-nourished.  HENT:  Right Ear: External ear normal.  Left Ear: External ear normal.  Mouth/Throat: Oropharynx is clear and moist.  Neck: Neck supple.  Cardiovascular: Normal rate and regular rhythm.  Pulmonary/Chest: Effort normal and breath sounds normal. She has no wheezes. She has no rales.  Lymphadenopathy:    She has no cervical adenopathy.       Assessment:     Acute bronchitis.  Patient  has history of asthma but no active wheezing at this time.  No respiratory distress.  No rales.  Suspect viral    Plan:     -Refilled albuterol for as needed use -Follow-up promptly for any fever or worsening symptoms -Printed prescription for prednisone taper to start if she has any increased wheezing -We did not see clear indication for antibiotics at this point with normal exam and no fever.  Kristian CoveyBruce W Brooklee Michelin MD [facility]

## 2018-03-31 NOTE — Patient Instructions (Signed)

## 2018-05-12 ENCOUNTER — Ambulatory Visit: Payer: Medicaid Other | Admitting: Neurology

## 2018-09-06 NOTE — Progress Notes (Signed)
Virtual Visit via Video Note The purpose of this virtual visit is to provide medical care while limiting exposure to the novel coronavirus.    Consent was obtained for video visit:  Yes Answered questions that patient had about telehealth interaction:  Yes.   I discussed the limitations, risks, security and privacy concerns of performing an evaluation and management service by telemedicine. I also discussed with the patient that there may be a patient responsible charge related to this service. The patient expressed understanding and agreed to proceed.  Pt location: Home Physician Location: office Name of referring provider:  Nelwyn Salisbury, MD I connected with Shelby Obrien at patients initiation/request on 09/07/2018 at  9:00 AM EDT by video enabled telemedicine application and verified that I am speaking with the correct person using two identifiers. Pt MRN:  [id number] Pt DOB:  [birth year] Video Participants:  Shelby Nip Farrey   History of Present Illness:  Shelby Obrien is a 28 year old female with asthma, anxiety and migraines who follows up for migraines.  UPDATE: Headaches have gotten worse since the end of last month.  She does have some increased stress.  She has since had a baby.  Rizatriptan initially working until last month. Intensity:  10/10 Duration:  May happen during the day but usually at night and last all night Frequency:  Every other day Current NSAIDS:  no Current analgesics:  none Current triptans:  rizatriptan  Current ergotamine:  no Current anti-emetic:  Zofran  Current muscle relaxants:  no Current anti-anxiolytic:  no Current sleep aide:  no Current Antihypertensive medications:  no Current Antidepressant medications:  nortriptyline  Current Anticonvulsant medications:  no Current anti-CGRP:  no Current Vitamins/Herbal/Supplements:  no Current Antihistamines/Decongestants:  no Other therapy:  no Other medication:  no  Caffeine:  1 cup coffee 5 days a week, 3 teas daily, Coke infrequent Alcohol:  no Smoker:  Quit Diet:  2 bottles 12 oz water daily, juice Exercise:  no Depression:  no; Anxiety:  no Other pain:  Mild back pain Sleep hygiene:  Poor.  She has had a baby.  Not breastfeeding.  She hasn't been getting much sleep at night.  She will try to get a little sleep in after taking her daughters to daycare.  HISTORY: Onset:  Her early 38s Location:  Frontal/temporal either side Quality:  pounding Initial Intensity:  10/10.  She denies new headache, thunderclap headache Aura:  no Prodrome:  no Postdrome:  tired Associated symptoms:  Nausea, vomiting, dizziness, photophobia, phonophobia, osmophobia, rarely sees black spots.  She denies associated autonomic symptoms or unilateral numbness or weakness. Initial Duration:  3 days (lately 2 weeks) Initial Frequency:  Every 2 weeks Initial Frequency of abortive medication: only when needed Triggers:  Unknown.  Not associated with menstrual period Relieving factors:  Resting and sleep (sometimes) Activity:  Routine daily activity aggravates.  Past NSAIDS:  ibuprofen Past analgesics:  Tylenol, Excedrin Past abortive triptans:  Sumatriptan  (initially effective but stopped working) Past abortive ergotamine:  no Past muscle relaxants:  no Past anti-emetic:  no Past antihypertensive medications:  no Past antidepressant medications:  no Past anticonvulsant medications:  no Past anti-CGRP:  no Past vitamins/Herbal/Supplements:  no Past antihistamines/decongestants:  no Other past therapies:  no  Family history of headache:  Mother, sister (migraines), paternal cousins  Past Medical History: Past Medical History:  Diagnosis Date  . Asthma   . Migraines   . Obesity    BMI>40  Medications: Outpatient Encounter Medications as of 09/07/2018  Medication Sig  . albuterol (PROVENTIL HFA;VENTOLIN HFA) 108 (90 Base) MCG/ACT inhaler Inhale 2  puffs into the lungs every 4 (four) hours as needed for wheezing or shortness of breath.  . docusate sodium (COLACE) 100 MG capsule Take 1 capsule (100 mg total) by mouth 2 (two) times daily as needed.  Marland Kitchen ibuprofen (ADVIL,MOTRIN) 600 MG tablet Take 1 tablet (600 mg total) by mouth every 6 (six) hours as needed.  . nortriptyline (PAMELOR) 25 MG capsule Take 1 capsule (25 mg total) by mouth at bedtime.  . ondansetron (ZOFRAN ODT) 4 MG disintegrating tablet Take 1 tablet (4 mg total) by mouth every 8 (eight) hours as needed for nausea or vomiting.  . predniSONE (DELTASONE) 10 MG tablet Taper as follows: 4-4-4-3-3-2-2  . rizatriptan (MAXALT-MLT) 10 MG disintegrating tablet Take 1 tablet earliest onset of migraine.  May repeat x1 in 2 hours if needed.  Do not exceed 2 tablets in 24h   No facility-administered encounter medications on file as of 09/07/2018.     Allergies: Allergies  Allergen Reactions  . Shellfish Allergy Anaphylaxis  . Other     BLUEBERRIES, BANANAS, TREE NUTS, RASPBERRIES, GRAPES, POLLENS/SEASONAL.    Family History: Family History  Problem Relation Age of Onset  . Cervical cancer Mother   . Diabetes Maternal Grandmother   . Heart disease Maternal Grandmother   . Diabetes Paternal Grandmother   . Heart disease Paternal Grandmother   . Diabetes Paternal Uncle     Social History: Social History   Socioeconomic History  . Marital status: Single    Spouse name: Not on file  . Number of children: 2  . Years of education: 16  . Highest education level: Bachelor's degree (e.g., BA, AB, BS)  Occupational History  . Occupation: Multimedia programmer: Occupational hygienist  Social Needs  . Financial resource strain: Not on file  . Food insecurity:    Worry: Not on file    Inability: Not on file  . Transportation needs:    Medical: Not on file    Non-medical: Not on file  Tobacco Use  . Smoking status: Former Smoker    Packs/day: 0.00  . Smokeless tobacco: Never Used   Substance and Sexual Activity  . Alcohol use: No  . Drug use: No  . Sexual activity: Yes    Birth control/protection: None  Lifestyle  . Physical activity:    Days per week: Not on file    Minutes per session: Not on file  . Stress: Not on file  Relationships  . Social connections:    Talks on phone: Not on file    Gets together: Not on file    Attends religious service: Not on file    Active member of club or organization: Not on file    Attends meetings of clubs or organizations: Not on file    Relationship status: Not on file  . Intimate partner violence:    Fear of current or ex partner: Not on file    Emotionally abused: Not on file    Physically abused: Not on file    Forced sexual activity: Not on file  Other Topics Concern  . Not on file  Social History Narrative   Lives with boyfriend and 2 daughters in an apartment on the 2nd floor.  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  Education: college.    Observations/Objective:   Pulse 90, height 5\' 4"  (  1.626 m), weight 267 lb (121.1 kg), unknown if currently  No acute distress.  Alert and oriented.  Speech fluent and not dysarthric.  Language intact.  Eyes orthophoric on primary gaze.  Face symmetric.  Assessment and Plan:   1.  Migraine without aura, without status migrainosus, not intractable.  Increased frequency and not responding to rizatriptan.  Likely aggravated by increased stress and possibly changes in weather.  1.  For preventative management, we will increase nortriptyline to 50 mg at bedtime.  We can further increase dose to 75 mg at bedtime in 4 weeks if needed. 2.  For abortive therapy, she will take naproxen 500 mg along with each dose of rizatriptan 10 mg 3.  Limit use of pain relievers to no more than 2 days out of week to prevent risk of rebound or medication-overuse headache. 4.  Keep headache diary 5.  Exercise, hydration, caffeine cessation, sleep hygiene, monitor for and avoid triggers 6.  Consider:   magnesium citrate 400mg  daily, riboflavin 400mg  daily, and coenzyme Q10 100mg  three times daily 7. Always keep in mind that currently taking a hormone or birth control may be a possible trigger or aggravating factor for migraine. 8. Follow up in 3 to 4 months.  Follow Up Instructions:    -I discussed the assessment and treatment plan with the patient. The patient was provided an opportunity to ask questions and all were answered. The patient agreed with the plan and demonstrated an understanding of the instructions.   The patient was advised to call back or seek an in-person evaluation if the symptoms worsen or if the condition fails to improve as anticipated.   Cira ServantAdam Robert Estreya Clay, DO

## 2018-09-07 ENCOUNTER — Encounter: Payer: Self-pay | Admitting: Neurology

## 2018-09-07 ENCOUNTER — Other Ambulatory Visit: Payer: Self-pay

## 2018-09-07 ENCOUNTER — Telehealth (INDEPENDENT_AMBULATORY_CARE_PROVIDER_SITE_OTHER): Payer: Self-pay | Admitting: Neurology

## 2018-09-07 VITALS — HR 90 | Ht 64.0 in | Wt 267.0 lb

## 2018-09-07 DIAGNOSIS — G43009 Migraine without aura, not intractable, without status migrainosus: Secondary | ICD-10-CM

## 2018-09-07 MED ORDER — NORTRIPTYLINE HCL 50 MG PO CAPS: 50.0000 mg | ORAL_CAPSULE | Freq: Every day | ORAL | 3 refills | Status: DC

## 2018-09-07 MED ORDER — NAPROXEN 500 MG PO TABS: 500.0000 mg | ORAL_TABLET | Freq: Two times a day (BID) | ORAL | 3 refills | Status: DC | PRN

## 2018-10-19 ENCOUNTER — Telehealth: Payer: Self-pay | Admitting: Nurse Practitioner

## 2018-10-19 DIAGNOSIS — B373 Candidiasis of vulva and vagina: Secondary | ICD-10-CM

## 2018-10-19 DIAGNOSIS — B3731 Acute candidiasis of vulva and vagina: Secondary | ICD-10-CM

## 2018-10-19 MED ORDER — FLUCONAZOLE 150 MG PO TABS: 150.0000 mg | ORAL_TABLET | Freq: Once | ORAL | 0 refills | Status: AC

## 2018-10-19 NOTE — Progress Notes (Signed)

## 2018-10-26 ENCOUNTER — Telehealth: Payer: BC Managed Care – PPO | Admitting: Physician Assistant

## 2018-10-26 DIAGNOSIS — N898 Other specified noninflammatory disorders of vagina: Secondary | ICD-10-CM

## 2018-10-26 MED ORDER — FLUCONAZOLE 150 MG PO TABS: 150.0000 mg | ORAL_TABLET | Freq: Once | ORAL | 0 refills | Status: AC

## 2018-10-26 NOTE — Progress Notes (Signed)
We are sorry that you are not feeling well. Here is how we plan to help! Based on what you shared with me it looks like you: May have a yeast vaginosis   Vaginosis is an inflammation of the vagina that can result in discharge, itching and pain. The cause is usually a change in the normal balance of vaginal bacteria or an infection. Vaginosis can also result from reduced estrogen levels after menopause.  The most common causes of vaginosis are:   Bacterial vaginosis which results from an overgrowth of one on several organisms that are normally present in your vagina.   Yeast infections which are caused by a naturally occurring fungus called candida.   Vaginal atrophy (atrophic vaginosis) which results from the thinning of the vagina from reduced estrogen levels after menopause.   Trichomoniasis which is caused by a parasite and is commonly transmitted by sexual intercourse.  Factors that increase your risk of developing vaginosis include: Medications, such as antibiotics and steroids Uncontrolled diabetes Use of hygiene products such as bubble bath, vaginal spray or vaginal deodorant Douching Wearing damp or tight-fitting clothing Using an intrauterine device (IUD) for birth control Hormonal changes, such as those associated with pregnancy, birth control pills or menopause Sexual activity Having a sexually transmitted infection  Your treatment plan is A single Diflucan (fluconazole) 159m tablet once.  I have electronically sent this prescription into the pharmacy that you have chosen.  Be sure to take all of the medication as directed. Stop taking any medication if you develop a rash, tongue swelling or shortness of breath. Mothers who are breast feeding should consider pumping and discarding their breast milk while on these antibiotics. However, there is no consensus that infant exposure at these doses would be harmful.  Remember that medication creams can weaken latex  condoms. .Marland Kitchen  HOME CARE:  Good hygiene may prevent some types of vaginosis from recurring and may relieve some symptoms:  Avoid baths, hot tubs and whirlpool spas. Rinse soap from your outer genital area after a shower, and dry the area well to prevent irritation. Don't use scented or harsh soaps, such as those with deodorant or antibacterial action. Avoid irritants. These include scented tampons and pads. Wipe from front to back after using the toilet. Doing so avoids spreading fecal bacteria to your vagina.  Other things that may help prevent vaginosis include:  Don't douche. Your vagina doesn't require cleansing other than normal bathing. Repetitive douching disrupts the normal organisms that reside in the vagina and can actually increase your risk of vaginal infection. Douching won't clear up a vaginal infection. Use a latex condom. Both female and female latex condoms may help you avoid infections spread by sexual contact. Wear cotton underwear. Also wear pantyhose with a cotton crotch. If you feel comfortable without it, skip wearing underwear to bed. Yeast thrives in mCampbell SoupYour symptoms should improve in the next day or two.  GET HELP RIGHT AWAY IF:  You have pain in your lower abdomen ( pelvic area or over your ovaries) You develop nausea or vomiting You develop a fever Your discharge changes or worsens You have persistent pain with intercourse You develop shortness of breath, a rapid pulse, or you faint.  These symptoms could be signs of problems or infections that need to be evaluated by a medical provider now.  MAKE SURE YOU   Understand these instructions. Will watch your condition. Will get help right away if you are not doing well or get  worse.  Your e-visit answers were reviewed by a board certified advanced clinical practitioner to complete your personal care plan. Depending upon the condition, your plan could have included both over the counter or  prescription medications. Please review your pharmacy choice to make sure that you have choses a pharmacy that is open for you to pick up any needed prescription, Your safety is important to [place]. If you have drug allergies check your prescription carefully.   You can use MyChart to ask questions about today's visit, request a non-urgent call back, or ask for a work or school excuse for 24 hours related to this e-Visit. If it has been greater than 24 hours you will need to follow up with your provider, or enter a new e-Visit to address those concerns. You will get a MyChart message within the next two days asking about your experience. I hope that your e-visit has been valuable and will speed your recovery.  ===View-only below this line===   ----- Message -----    From: Salena Saner Freitas    Sent: 10/26/2018  8:52 AM EDT      To: E-Visit Mailing List Subject: Vaginal Symptoms  Vaginal Symptoms --------------------------------  Question: Which of the following are you experiencing? Answer:   Vaginal pain            Vaginal itching  Question: Are you having pain while passing urine? Answer:   Yes, I have pain while urinating  Question: Which of the following applies to your vaginal discharge Answer:   I have a clear discharge  Question: Are you pregnant? Answer:   I am confident that I am not pregnant  Question: Are you breastfeeding? Answer:   No  Question: Which of the following are you experiencing? Answer:   None of the above  Question: Do you have any sores on your genitals? Answer:   No  Question: Have you taken antibiotics recently? Answer:   Yes, I recently took some  Question: Please enter when you took antibiotics, and the name of the medicine, if you know it. Answer:   diflucan (fluconazole)            June 24 or 25  Question: Do you do any of the following? Answer:   I take bubble baths            I use douches  Question: Which of the following applies  to your menstrual period? Answer:   I had a menstrual period in the last 2 weeks  Question: Have you had similar symptoms in the past? Answer:   Yes, I have had similar symptoms before  Question: When you had similar symptoms in  the past, did any of the following work? Answer:   Cream for yeast infection            Pills for urine infection  Question: Do you have a fever? Answer:   No, I do not have a fever  Question: During the past 2 months, have you had sexual contact with a specific person for the first time? Answer:   No  Question: Has a person with whom you have had sexual contact been recently told they have a disease possibly acquired through sex? Answer:   No  Question: Please list your medication allergies that you may have ? (If 'none' , please list as 'none') Answer:   shellfish  Question: Please list any additional comments  Answer:     A total of 5-10 minutes  was spent evaluating this patients questionnaire and formulating a plan of care.

## 2018-11-30 ENCOUNTER — Encounter: Payer: Self-pay | Admitting: Neurology

## 2018-11-30 ENCOUNTER — Encounter: Payer: Self-pay | Admitting: Family Medicine

## 2018-12-06 ENCOUNTER — Emergency Department (HOSPITAL_COMMUNITY)
Admission: EM | Admit: 2018-12-06 | Discharge: 2018-12-06 | Disposition: A | Discharge status: Home / Self Care | Payer: BC Managed Care – PPO | Attending: Emergency Medicine | Admitting: Emergency Medicine

## 2018-12-06 ENCOUNTER — Encounter (HOSPITAL_COMMUNITY): Payer: Self-pay | Admitting: *Deleted

## 2018-12-06 ENCOUNTER — Other Ambulatory Visit: Payer: Self-pay

## 2018-12-06 DIAGNOSIS — M791 Myalgia, unspecified site: Secondary | ICD-10-CM | POA: Diagnosis not present

## 2018-12-06 DIAGNOSIS — R059 Cough, unspecified: Secondary | ICD-10-CM

## 2018-12-06 DIAGNOSIS — R05 Cough: Secondary | ICD-10-CM | POA: Diagnosis not present

## 2018-12-06 DIAGNOSIS — R0981 Nasal congestion: Secondary | ICD-10-CM | POA: Diagnosis not present

## 2018-12-06 DIAGNOSIS — R52 Pain, unspecified: Secondary | ICD-10-CM

## 2018-12-06 DIAGNOSIS — Z20828 Contact with and (suspected) exposure to other viral communicable diseases: Secondary | ICD-10-CM | POA: Insufficient documentation

## 2018-12-06 DIAGNOSIS — J45909 Unspecified asthma, uncomplicated: Secondary | ICD-10-CM | POA: Diagnosis not present

## 2018-12-06 DIAGNOSIS — Z87891 Personal history of nicotine dependence: Secondary | ICD-10-CM | POA: Diagnosis not present

## 2018-12-06 DIAGNOSIS — R51 Headache: Secondary | ICD-10-CM | POA: Diagnosis not present

## 2018-12-06 LAB — SARS CORONAVIRUS 2 (TAT 6-24 HRS): SARS Coronavirus 2: NEGATIVE

## 2018-12-06 MED ORDER — ALBUTEROL SULFATE HFA 108 (90 BASE) MCG/ACT IN AERS
2.0000 | INHALATION_SPRAY | Freq: Once | RESPIRATORY_TRACT | Status: AC
  Administered 2018-12-06: 2 via RESPIRATORY_TRACT
  Filled 2018-12-06: qty 6.7

## 2018-12-06 MED ORDER — FLUTICASONE PROPIONATE 50 MCG/ACT NA SUSP: 2.0000 | Freq: Every day | NASAL | 2 refills | Status: DC

## 2018-12-06 MED ORDER — ALBUTEROL SULFATE HFA 108 (90 BASE) MCG/ACT IN AERS: 2.0000 | INHALATION_SPRAY | RESPIRATORY_TRACT | 2 refills | Status: DC | PRN

## 2018-12-06 NOTE — ED Provider Notes (Addendum)
[facility]   CSN: [id number] Arrival date & time: 12/06/18  0559    History   Chief Complaint Chief Complaint  Patient presents with  . Generalized Body Aches    HPI Shelby Obrien is a 28 y.o. female.     HPI   Shelby Obrien is a 28 y.o. female, with a history of asthma, migraines, and obesity, presenting to the ED with generalized body aches beginning this morning.  Accompanied by nonproductive cough, nasal congestion, postnasal drip, sore throat, and headache.  Headache is mild, generalized, constant. Patient also mentions she is out of her albuterol inhaler.  She has not tried any therapies for her symptoms.  Patient denies contact with known or suspected COVID-19 patients.  Denies fever/chills, shortness of breath, chest pain, abdominal pain, N/V/D, or any other complaints.    Past Medical History:  Diagnosis Date  . Asthma   . Migraines   . Obesity    BMI>40    Patient Active Problem List   Diagnosis Date Noted  . Postpartum care following vaginal delivery 10/03/2017  . Abdominal pain in pregnancy 09/23/2017  . Anxiety and depression 09/04/2017  . Nausea and vomiting in pregnancy 03/05/2017  . Supervision of high risk pregnancy, antepartum, first trimester 02/26/2017  . Maternal obesity affecting pregnancy, antepartum 02/26/2017  . BMI 40.0-44.9, adult (HCC) 02/26/2017  . Migraines 10/07/2016  . Asthma 10/07/2016    Past Surgical History:  Procedure Laterality Date  . WISDOM TOOTH EXTRACTION       OB History    Gravida  3   Para  2   Term  2   Preterm      AB  1   Living  2     SAB  1   TAB      Ectopic      Multiple  0   Live Births  2            Home Medications    Prior to Admission medications   Medication Sig Start Date End Date Taking? Authorizing Provider  albuterol (PROVENTIL HFA;VENTOLIN HFA) 108 (90 Base) MCG/ACT inhaler Inhale 2 puffs  into the lungs every 4 (four) hours as needed for wheezing or shortness of breath. 03/31/18  Yes Burchette, Elberta FortisBruce W, MD  nortriptyline (PAMELOR) 50 MG capsule Take 1 capsule (50 mg total) by mouth at bedtime. 09/07/18  Yes Jaffe, Adam R, DO  rizatriptan (MAXALT-MLT) 10 MG disintegrating tablet Take 1 tablet earliest onset of migraine.  May repeat x1 in 2 hours if needed.  Do not exceed 2 tablets in 24h 01/01/18  Yes Jaffe, Adam R, DO  albuterol (VENTOLIN HFA) 108 (90 Base) MCG/ACT inhaler Inhale 2 puffs into the lungs every 4 (four) hours as needed for wheezing or shortness of breath. 12/06/18   Caterra Ostroff C, PA-C  docusate sodium (COLACE) 100 MG capsule Take 1 capsule (100 mg total) by mouth 2 (two) times daily as needed. Patient not taking: Reported on 12/06/2018 07/15/17   Oswaldo ConroySchmid, Jacelyn Y, CNM  fluticasone [facility](FLONASE) 50 MCG/ACT nasal spray Place 2 sprays into both nostrils daily. 12/06/18   Mervil Wacker C, PA-C  ibuprofen (ADVIL,MOTRIN) 600 MG tablet Take 1 tablet (600 mg total) by mouth every 6 (six) hours as needed. Patient not taking: Reported on 12/06/2018 10/05/17   Farrel ConnersGutierrez, Colleen, CNM  naproxen (NAPROSYN) 500 MG tablet Take 1 tablet (500 mg total) by mouth 2 (two) times daily as  needed. Patient not taking: Reported on 12/06/2018 09/07/18   Pieter Partridge, DO    Family History Family History  Problem Relation Age of Onset  . Cervical cancer Mother   . Diabetes Maternal Grandmother   . Heart disease Maternal Grandmother   . Diabetes Paternal Grandmother   . Heart disease Paternal Grandmother   . Diabetes Paternal Uncle   . Diabetes Sister     Social History Social History   Tobacco Use  . Smoking status: Former Smoker    Packs/day: 0.00  . Smokeless tobacco: Never Used  Substance Use Topics  . Alcohol use: No  . Drug use: No     Allergies   Shellfish allergy and Other   Review of Systems Review of Systems  Constitutional: Negative for chills and fever.  HENT: Positive for  congestion, postnasal drip, rhinorrhea and sore throat. Negative for trouble swallowing and voice change.   Respiratory: Positive for cough. Negative for chest tightness and shortness of breath.   Cardiovascular: Negative for chest pain.  Gastrointestinal: Negative for abdominal pain, diarrhea, nausea and vomiting.  Musculoskeletal: Negative for neck pain.  Neurological: Positive for headaches. Negative for dizziness, syncope, weakness, light-headedness and numbness.  All other systems reviewed and are negative.    Physical Exam Updated Vital Signs BP (!) 140/92 (BP Location: Right Arm)   Pulse 96   Temp 98.5 F (36.9 C) (Oral)   Resp 20   Ht 5\' 5"  (1.651 m)   Wt 113.4 kg   LMP 11/05/2018   SpO2 100%   BMI 41.60 kg/m   Physical Exam Vitals signs and nursing note reviewed.  Constitutional:      General: She is not in acute distress.    Appearance: She is well-developed. She is not diaphoretic.  HENT:     Head: Normocephalic and atraumatic.     Nose: Mucosal edema present.     Right Sinus: No maxillary sinus tenderness or frontal sinus tenderness.     Left Sinus: No maxillary sinus tenderness or frontal sinus tenderness.     Mouth/Throat:     Mouth: Mucous membranes are moist.     Pharynx: Oropharynx is clear.  Eyes:     Conjunctiva/sclera: Conjunctivae normal.  Neck:     Musculoskeletal: Neck supple.  Cardiovascular:     Rate and Rhythm: Normal rate and regular rhythm.     Pulses: Normal pulses.     Heart sounds: Normal heart sounds.  Pulmonary:     Effort: Pulmonary effort is normal. No respiratory distress.     Breath sounds: Normal breath sounds.  Abdominal:     Palpations: Abdomen is soft.     Tenderness: There is no abdominal tenderness. There is no guarding.  Musculoskeletal:     Right lower leg: No edema.     Left lower leg: No edema.  Lymphadenopathy:     Cervical: No cervical adenopathy.  Skin:    General: Skin is warm and dry.  Neurological:      Mental Status: She is alert.  Psychiatric:        Mood and Affect: Mood and affect normal.        Speech: Speech normal.        Behavior: Behavior normal.      ED Treatments / Results  Labs (all labs ordered are listed, but only abnormal results are displayed) Labs Reviewed  NOVEL CORONAVIRUS, NAA (HOSPITAL ORDER, SEND-OUT TO REF LAB)  SARS CORONAVIRUS 2    EKG  None  Radiology No results found.  Procedures Procedures (including critical care time)  Medications Ordered in ED Medications  albuterol (VENTOLIN HFA) 108 (90 Base) MCG/ACT inhaler 2 puff (2 puffs Inhalation Given 12/06/18 0814)     Initial Impression / Assessment and Plan / ED Course  I have reviewed the triage vital signs and the nursing notes.  Pertinent labs & imaging results that were available during my care of the patient were reviewed by me and considered in my medical decision making (see chart for details).  Clinical Course as of Dec 05 856  Mon Dec 06, 2018  [id number] RN collected incorrect swab. Instead collected swab for Aptima testing. Tried to change the order after patient was discharged, but system would not allow me to cancel send out test order.  Novel Coronavirus, NAA (send-out to ref lab) [SJ]    Clinical Course User Index [SJ] Harriet Sutphen C, PA-C       Patient presents with a variety of symptoms suggestive of viral syndrome. Patient is nontoxic appearing, afebrile, not tachycardic, not tachypneic, not hypotensive, excellent SPO2 on room air, and is in no apparent distress.  The patient was given instructions for home care as well as return precautions. Patient voices understanding of these instructions, accepts the plan, and is comfortable with discharge.   Candiss Gabriel RungMonique Avedisian was evaluated in Emergency Department on 12/06/2018 for the symptoms described in the history of present illness. She was evaluated in the context of the global COVID-19 pandemic, which necessitated consideration  that the patient might be at risk for infection with the SARS-CoV-2 virus that causes COVID-19. Institutional protocols and algorithms that pertain to the evaluation of patients at risk for COVID-19 are in a state of rapid change based on information released by regulatory bodies including the CDC and federal and state organizations. These policies and algorithms were followed during the patient's care in the ED.  Final Clinical Impressions(s) / ED Diagnoses   Final diagnoses:  Body aches  Cough  Nasal congestion    ED Discharge Orders         Ordered    albuterol (VENTOLIN HFA) 108 (90 Base) MCG/ACT inhaler  Every 4 hours PRN     12/06/18 0724    fluticasone (FLONASE) 50 MCG/ACT nasal spray  Daily     12/06/18 0724           Anselm PancoastJoy, Ysabel Stankovich C, PA-C 12/06/18 0730    Rafaella Kole, Hillard DankerShawn C, PA-C 12/06/18 [id number]    Pricilla LovelessGoldston, Scott, MD 12/06/18 1021

## 2018-12-06 NOTE — ED Triage Notes (Signed)
The pt isnhere with a cold she woke up this am with her symptoms headache stuffy nose    Lm,p last month no distress no sob

## 2018-12-06 NOTE — Discharge Instructions (Addendum)
Your symptoms are likely consistent with a viral illness. Viruses do not require or respond to antibiotics. Treatment is symptomatic care and it is important to note that these symptoms may last for 7-14 days.   Hand washing: Wash your hands throughout the day, but especially before and after touching the face, using the restroom, sneezing, coughing, or touching surfaces that have been coughed or sneezed upon. Hydration: Symptoms of most illnesses will be intensified and complicated by dehydration. Dehydration can also extend the duration of symptoms. Drink plenty of fluids and get plenty of rest. You should be drinking at least half a liter of water an hour to stay hydrated. Electrolyte drinks (ex. Gatorade, Powerade, Pedialyte) are also encouraged. You should be drinking enough fluids to make your urine light yellow, almost clear. If this is not the case, you are not drinking enough water. Please note that some of the treatments indicated below will not be effective if you are not adequately hydrated. Pain or fever: Ibuprofen, Naproxen, or acetaminophen (generic for Tylenol) for pain or fever.  Antiinflammatory medications: Take 600 mg of ibuprofen every 6 hours or 440 mg (over the counter dose) to 500 mg (prescription dose) of naproxen every 12 hours for the next 3 days. After this time, these medications may be used as needed for pain. Take these medications with food to avoid upset stomach. Choose only one of these medications, do not take them together. Acetaminophen (generic for Tylenol): Should you continue to have additional pain while taking the ibuprofen or naproxen, you may add in acetaminophen as needed. Your daily total maximum amount of acetaminophen from all sources should be limited to 4000mg /day for persons without liver problems, or 2000mg /day for those with liver problems. Cough: Use the benzonatate (generic for Tessalon) for cough.  Teas, warm liquids, broths, and honey can also help  with cough. Albuterol: May use the albuterol as needed for instances of shortness of breath. Zyrtec or Claritin: May add these medication daily to control underlying symptoms of congestion, sneezing, and other signs of allergies.  These medications are available over-the-counter. Generics: Cetirizine (generic for Zyrtec) and loratadine (generic for Claritin). Fluticasone: Use fluticasone (generic for Flonase), as directed, for nasal and sinus congestion.  This medication is available over-the-counter. Congestion: Plain guaifenesin (generic for plain Mucinex) may help relieve congestion. Saline sinus rinses and saline nasal sprays may also help relieve congestion. If you do not have high blood pressure, heart problems, or an allergy to such medications, you may also try phenylephrine or Sudafed. Sore throat: Warm liquids or Chloraseptic spray may help soothe a sore throat. Gargle twice a day with a salt water solution made from a half teaspoon of salt in a cup of warm water.  Follow up: Follow up with a primary care provider within the next two weeks should symptoms fail to resolve. Return: Return to the ED for significantly worsening symptoms, shortness of breath, persistent vomiting, large amounts of blood in stool, or any other major concerns.  For prescription assistance, may try using prescription discount sites or apps, such as goodrx.com  Your blood pressure was noted to be higher than normal today.  Please have this rechecked by primary care provider.  You have a test pending for COVID-19.  Results typically return within about 48 hours.  Be sure to check MyChart for updated results.  We recommend isolating yourself until results are received.  Patients who have symptoms consistent with COVID-19 should self isolated for: At least 3 days (72  hours) have passed since recovery, defined as resolution of fever without the use of fever reducing medications and improvement in respiratory symptoms  (e.g., cough, shortness of breath), and At least 7 days have passed since symptoms first appeared.  If you have no symptoms, but your test returns positive, recommend isolating for at least 10 days.

## 2018-12-09 ENCOUNTER — Ambulatory Visit: Payer: Self-pay | Admitting: Neurology

## 2018-12-19 NOTE — Progress Notes (Signed)
Virtual Visit via Video Note The purpose of this virtual visit is to provide medical care while limiting exposure to the novel coronavirus.    Consent was obtained for video visit:  yes Answered questions that patient had about telehealth interaction:  Yes I discussed the limitations, risks, security and privacy concerns of performing an evaluation and management service by telemedicine. I also discussed with the patient that there may be a patient responsible charge related to this service. The patient expressed understanding and agreed to proceed.  Pt location: [facility] Name of referring provider:  Laurey Morale, MD I connected with Shelby Saner [place]on at patients initiation/request on 12/20/2018 at  9:50 AM EDT by video enabled telemedicine application and verified that I am speaking with the correct person using two identifiers. Pt MRN:  [id number] Pt DOB:  1991/04/20 Video Participants:  Shelby Obrien   History of Present Illness:  Shelby Obrien is a 28 year old female with asthma, anxiety and migraines who follows up for migraines.Marland Kitchen  UPDATE:. Intensity: two were 3/10, 1 was 8-9/10 (around her menses) Duration:  1 day.  Taking naproxen with rizatriptan didn't make a difference. Frequency:  3 headaches in last 30 days  Rescue therapy:  Rizatriptan 10mg  Current NSAIDS:none Current analgesics:none Current triptans:rizatriptan 10mg  Current ergotamine:no Current anti-emetic:Zofran 4mg  Current muscle relaxants:no Current anti-anxiolytic:no Current sleep aide:no Current Antihypertensive medications:no Current Antidepressant medications:nortriptyline 50mg  Current Anticonvulsant medications:no Current anti-CGRP:no Current Vitamins/Herbal/Supplements:no Current Antihistamines/Decongestants:no Other therapy:no Other medication:no  Caffeine:1 cup coffee 5 days a week, 3 teas daily, Coke infrequent  Alcohol:no Smoker:Quit Diet:2 bottles 12 oz water daily, juice Exercise:no Depression:no; Anxiety:no Other pain:Mild back pain Sleep hygiene:Poor.  She has had a baby.  Not breastfeeding.  She hasn't been getting much sleep at night.  She will try to get a little sleep in after taking her daughters to daycare.  HISTORY: Onset:  In her 11s Location:Frontal/temporal either side Quality:pounding Initial Intensity:10/10.Shedenies new headache, thunderclap headache Aura:no Prodrome:no Postdrome:tired Associated symptoms:  Nausea vomiting, dizziness, photophobia, phonophobia, osmophobia, rarely sees black spots.Shedenies associatedautonomic symptoms orunilateral numbness or weakness. Initial Duration:3 days (lately 2 weeks) Initial Frequency:Every 2 weeks Initial Frequency of abortive medication:only when needed Triggers:  Unknown. Not associated with menstrual period Relieving factors:  Rest and sleep (sometimes) Activity:Routine daily activity aggravates.  Past NSAIDS:ibuprofen, naproxen Past analgesics:Tylenol, Excedrin Past abortive triptans:Sumatriptan 100mg (initially effective but stopped working) Past abortive ergotamine:no Past muscle relaxants:no Past anti-emetic:no Past antihypertensive medications:no Past antidepressant medications:no Past anticonvulsant medications:no Past anti-CGRP:no Past vitamins/Herbal/Supplements:no Past antihistamines/decongestants:no Other past therapies:no  Family history of headache:Mother, sister (migraines), paternal cousins  Past Medical History: Past Medical History:  Diagnosis Date  . Asthma   . Migraines   . Obesity    BMI>40    Medications: Outpatient Encounter Medications as of 12/20/2018  Medication Sig  . albuterol (PROVENTIL HFA;VENTOLIN HFA) 108 (90 Base) MCG/ACT inhaler Inhale 2 puffs into the lungs every 4 (four) hours as needed for wheezing or  shortness of breath.  Marland Kitchen albuterol (VENTOLIN HFA) 108 (90 Base) MCG/ACT inhaler Inhale 2 puffs into the lungs every 4 (four) hours as needed for wheezing or shortness of breath.  . docusate sodium (COLACE) 100 MG capsule Take 1 capsule (100 mg total) by mouth 2 (two) times daily as needed. (Patient not taking: Reported on 12/06/2018)  . fluticasone (FLONASE) 50 MCG/ACT nasal spray Place 2 sprays into both nostrils daily.  Marland Kitchen ibuprofen (ADVIL,MOTRIN) 600 MG tablet Take 1 tablet (600 mg total) by  mouth every 6 (six) hours as needed. (Patient not taking: Reported on 12/06/2018)  . naproxen (NAPROSYN) 500 MG tablet Take 1 tablet (500 mg total) by mouth 2 (two) times daily as needed. (Patient not taking: Reported on 12/06/2018)  . nortriptyline (PAMELOR) 50 MG capsule Take 1 capsule (50 mg total) by mouth at bedtime.  . rizatriptan (MAXALT-MLT) 10 MG disintegrating tablet Take 1 tablet earliest onset of migraine.  May repeat x1 in 2 hours if needed.  Do not exceed 2 tablets in 24h   No facility-administered encounter medications on file as of 12/20/2018.     Allergies: Allergies  Allergen Reactions  . Shellfish Allergy Anaphylaxis  . Other     BLUEBERRIES, BANANAS, TREE NUTS, RASPBERRIES, GRAPES, POLLENS/SEASONAL.    Family History: Family History  Problem Relation Age of Onset  . Cervical cancer Mother   . Diabetes Maternal Grandmother   . Heart disease Maternal Grandmother   . Diabetes Paternal Grandmother   . Heart disease Paternal Grandmother   . Diabetes Paternal Uncle   . Diabetes Sister     Social History: Social History   Socioeconomic History  . Marital status: Single    Spouse name: Not on file  . Number of children: 2  . Years of education: 16  . Highest education level: Bachelor's degree (e.g., BA, AB, BS)  Occupational History  . Occupation: Multimedia programmercook    Employer: [employer]  Social Needs  . Financial resource strain: Not on file  . Food insecurity    Worry: Not on file     Inability: Not on file  . Transportation needs    Medical: Not on file    Non-medical: Not on file  Tobacco Use  . Smoking status: Former Smoker    Packs/day: 0.00  . Smokeless tobacco: Never Used  Substance and Sexual Activity  . Alcohol use: No  . Drug use: No  . Sexual activity: Yes    Birth control/protection: None  Lifestyle  . Physical activity    Days per week: Not on file    Minutes per session: Not on file  . Stress: Not on file  Relationships  . Social Musicianconnections    Talks on phone: Not on file    Gets together: Not on file    Attends religious service: Not on file    Active member of club or organization: Not on file    Attends meetings of clubs or organizations: Not on file    Relationship status: Not on file  . Intimate partner violence    Fear of current or ex partner: Not on file    Emotionally abused: Not on file    Physically abused: Not on file    Forced sexual activity: Not on file  Other Topics Concern  . Not on file  Social History Narrative   Lives with boyfriend and 2 daughters in an apartment on the 2nd floor.  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  Education: college. She is right-handed. She drinks 3 cups of coffee and 1-2 glasses of tea a day. She is active with her children and work.    Observations/Objective:   Height 5\' 4"  (1.626 m), weight 274 lb (124.3 kg), unknown if currently breastfeeding. No acute distress.  Alert and oriented.  Speech fluent and not dysarthric.  Language intact.  Eyes orthophoric on primary gaze.  Face symmetric.  Assessment and Plan  Migraine without aura, without status migrainosus, not intractable  1.  For preventative  management, nortriptyline 50mg  at bedtime 2.  For abortive therapy, she will stop rizatriptan.  Instead, we will try Nurtec. 3.  Limit use of pain relievers to no more than 2 days out of week to prevent risk of rebound or medication-overuse headache. 4.  Keep headache diary 5.  Exercise, hydration,  caffeine cessation, sleep hygiene, monitor for and avoid triggers 6.  Consider:  magnesium citrate 400mg  daily, riboflavin 400mg  daily, and coenzyme Q10 100mg  three times daily 7. Always keep in mind that currently taking a hormone or birth control may be a possible trigger or aggravating factor for migraine. 8. Follow up 5 months.  Follow Up Instructions:    -I discussed the assessment and treatment plan with the patient. The patient was provided an opportunity to ask questions and all were answered. The patient agreed with the plan and demonstrated an understanding of the instructions.   The patient was advised to call back or seek an in-person evaluation if the symptoms worsen or if the condition fails to improve as anticipated.   Cira ServantAdam Robert Maninder Deboer, DO

## 2018-12-20 ENCOUNTER — Encounter: Payer: Self-pay | Admitting: Neurology

## 2018-12-20 ENCOUNTER — Other Ambulatory Visit: Payer: Self-pay

## 2018-12-20 ENCOUNTER — Telehealth (INDEPENDENT_AMBULATORY_CARE_PROVIDER_SITE_OTHER): Payer: Self-pay | Admitting: Neurology

## 2018-12-20 VITALS — Ht 64.0 in | Wt 274.0 lb

## 2018-12-20 DIAGNOSIS — G43009 Migraine without aura, not intractable, without status migrainosus: Secondary | ICD-10-CM

## 2018-12-20 MED ORDER — NURTEC 75 MG PO TBDP: 1.0000 | ORAL_TABLET | Freq: Every day | ORAL | 5 refills | Status: DC | PRN

## 2018-12-20 NOTE — Patient Instructions (Signed)
1.  Continue nortriptyline 50mg  at bedtime 2.  Stop rizatriptan.  Instead, when you get a migraine I want you to take Nurtec 75mg  (take 1 tablet daily as needed.  Maximum 1 tablet in 24 hours) 3.  Limit use of pain relievers to no more than 2 days out of week to prevent risk of rebound or medication-overuse headache. 4.  Keep headache diary 5.  Exercise, hydration, caffeine cessation, sleep hygiene, monitor for and avoid triggers 6.  Consider:  magnesium citrate 400mg  daily, riboflavin 400mg  daily, and coenzyme Q10 100mg  three times daily 7. Always keep in mind that currently taking a hormone or birth control may be a possible trigger or aggravating factor for migraine. 8. Follow up 5 months.

## 2018-12-21 ENCOUNTER — Telehealth: Payer: Self-pay | Admitting: Neurology

## 2018-12-21 NOTE — Telephone Encounter (Signed)
Patient is needing the new medication sent to the pharm on file. She is using the [place] on file from 2019. She said there was some issues with that. Thanks!

## 2018-12-22 NOTE — Telephone Encounter (Signed)
Outpatient Medication Detail   Disp Refills Start End   Rimegepant Sulfate (NURTEC) 75 MG TBDP 8 tablet 5 12/20/2018    Sig - Route: Take 1 tablet by mouth daily as needed (Maximum 1 tablet in 24 hours). - Oral   Sent to pharmacy as: Rimegepant Sulfate (NURTEC) 75 MG Tablet Dispersible   E-Prescribing Status: Receipt confirmed by pharmacy (12/20/2018 10:05 AM EDT)     Hulen Skains spoke with patient she was made aware that Rx was sent to pharmacy. She will contact pharmacy to pick Rx up

## 2018-12-24 ENCOUNTER — Encounter: Payer: Self-pay | Admitting: *Deleted

## 2018-12-24 NOTE — Progress Notes (Signed)
Unable to complete prior authorization due to insurance information not available I have called and left a message to call us with info or we will not be able to proceed to get authorized.  Shelby Obrien (Key: [id number]) Nurtec 75MG  dispersible tablets   Form Charity fundraiser PA Form (NCPDP) Created 3 days ago Sent to Plan Determination Request Not Sent This PA has been accessed, but not sent to the plan. Please fill out the required fields below and click "Send to Plan."

## 2018-12-29 ENCOUNTER — Telehealth: Payer: BC Managed Care – PPO | Admitting: Family

## 2018-12-29 DIAGNOSIS — B373 Candidiasis of vulva and vagina: Secondary | ICD-10-CM | POA: Diagnosis not present

## 2018-12-29 DIAGNOSIS — B3731 Acute candidiasis of vulva and vagina: Secondary | ICD-10-CM

## 2018-12-29 MED ORDER — FLUCONAZOLE 150 MG PO TABS
150.0000 mg | ORAL_TABLET | ORAL | 0 refills | Status: DC | PRN
Start: 2018-12-29 — End: 2019-01-19

## 2018-12-29 NOTE — Progress Notes (Signed)
We are sorry that you are not feeling well. Here is how we plan to help! Based on what you shared with me it looks like you: May have a yeast vaginosis.  Approximately 5 minutes was spent documenting and reviewing patient's chart.    Vaginosis is an inflammation of the vagina that can result in discharge, itching and pain. The cause is usually a change in the normal balance of vaginal bacteria or an infection. Vaginosis can also result from reduced estrogen levels after menopause.  The most common causes of vaginosis are:   Bacterial vaginosis which results from an overgrowth of one on several organisms that are normally present in your vagina.   Yeast infections which are caused by a naturally occurring fungus called candida.   Vaginal atrophy (atrophic vaginosis) which results from the thinning of the vagina from reduced estrogen levels after menopause.   Trichomoniasis which is caused by a parasite and is commonly transmitted by sexual intercourse.  Factors that increase your risk of developing vaginosis include: . Medications, such as antibiotics and steroids . Uncontrolled diabetes . Use of hygiene products such as bubble bath, vaginal spray or vaginal deodorant . Douching . Wearing damp or tight-fitting clothing . Using an intrauterine device (IUD) for birth control . Hormonal changes, such as those associated with pregnancy, birth control pills or menopause . Sexual activity . Having a sexually transmitted infection  Your treatment plan is A single Diflucan (fluconazole) 150mg tablet once.  I have electronically sent this prescription into the pharmacy that you have chosen.  Be sure to take all of the medication as directed. Stop taking any medication if you develop a rash, tongue swelling or shortness of breath. Mothers who are breast feeding should consider pumping and discarding their breast milk while on these antibiotics. However, there is no consensus that infant exposure  at these doses would be harmful.  Remember that medication creams can weaken latex condoms. .   HOME CARE:  Good hygiene may prevent some types of vaginosis from recurring and may relieve some symptoms:  . Avoid baths, hot tubs and whirlpool spas. Rinse soap from your outer genital area after a shower, and dry the area well to prevent irritation. Don't use scented or harsh soaps, such as those with deodorant or antibacterial action. . Avoid irritants. These include scented tampons and pads. . Wipe from front to back after using the toilet. Doing so avoids spreading fecal bacteria to your vagina.  Other things that may help prevent vaginosis include:  . Don't douche. Your vagina doesn't require cleansing other than normal bathing. Repetitive douching disrupts the normal organisms that reside in the vagina and can actually increase your risk of vaginal infection. Douching won't clear up a vaginal infection. . Use a latex condom. Both female and female latex condoms may help you avoid infections spread by sexual contact. . Wear cotton underwear. Also wear pantyhose with a cotton crotch. If you feel comfortable without it, skip wearing underwear to bed. Yeast thrives in moist environments Your symptoms should improve in the next day or two.  GET HELP RIGHT AWAY IF:  . You have pain in your lower abdomen ( pelvic area or over your ovaries) . You develop nausea or vomiting . You develop a fever . Your discharge changes or worsens . You have persistent pain with intercourse . You develop shortness of breath, a rapid pulse, or you faint.  These symptoms could be signs of problems or infections that need to   be evaluated by a medical provider now.  MAKE SURE YOU    Understand these instructions.  Will watch your condition.  Will get help right away if you are not doing well or get worse.  Your e-visit answers were reviewed by a board certified advanced clinical practitioner to complete  your personal care plan. Depending upon the condition, your plan could have included both over the counter or prescription medications. Please review your pharmacy choice to make sure that you have choses a pharmacy that is open for you to pick up any needed prescription, Your safety is important to us. If you have drug allergies check your prescription carefully.   You can use MyChart to ask questions about today's visit, request a non-urgent call back, or ask for a work or school excuse for 24 hours related to this e-Visit. If it has been greater than 24 hours you will need to follow up with your provider, or enter a new e-Visit to address those concerns. You will get a MyChart message within the next two days asking about your experience. I hope that your e-visit has been valuable and will speed your recovery.  

## 2019-01-04 ENCOUNTER — Ambulatory Visit: Payer: BC Managed Care – PPO | Admitting: Family Medicine

## 2019-01-05 ENCOUNTER — Telehealth: Payer: Self-pay

## 2019-01-05 NOTE — Telephone Encounter (Signed)
Patient has an appointment on 01/12/2019, Dr. Sarajane Jews is not in the office that morning. Called to reschedule.  Left message for patient to call back. CRM created.

## 2019-01-12 ENCOUNTER — Ambulatory Visit: Payer: BC Managed Care – PPO | Admitting: Family Medicine

## 2019-01-14 ENCOUNTER — Telehealth: Payer: Self-pay | Admitting: *Deleted

## 2019-01-14 NOTE — Telephone Encounter (Signed)
LMOM that I need to speak with her to get information off her insurance card to get the approval for Nurtec

## 2019-01-19 ENCOUNTER — Other Ambulatory Visit: Payer: Self-pay

## 2019-01-19 ENCOUNTER — Other Ambulatory Visit (HOSPITAL_COMMUNITY)
Admission: RE | Admit: 2019-01-19 | Discharge: 2019-01-19 | Disposition: A | Discharge status: Home / Self Care | Payer: BC Managed Care – PPO | Source: Ambulatory Visit | Attending: Family Medicine | Admitting: Family Medicine

## 2019-01-19 ENCOUNTER — Ambulatory Visit (INDEPENDENT_AMBULATORY_CARE_PROVIDER_SITE_OTHER): Payer: BC Managed Care – PPO | Admitting: Family Medicine

## 2019-01-19 ENCOUNTER — Encounter: Payer: Self-pay | Admitting: Family Medicine

## 2019-01-19 VITALS — BP 110/84 | HR 72 | Temp 97.2°F | Ht 64.75 in | Wt 275.4 lb

## 2019-01-19 DIAGNOSIS — Z01411 Encounter for gynecological examination (general) (routine) with abnormal findings: Secondary | ICD-10-CM

## 2019-01-19 MED ORDER — FLUCONAZOLE 150 MG PO TABS: 150.0000 mg | ORAL_TABLET | Freq: Once | ORAL | 11 refills | Status: AC

## 2019-01-19 NOTE — Progress Notes (Signed)
   Subjective:    Patient ID: Shelby Obrien, female    DOB: Jan 19, 1991, 28 y.o.   MRN: [id number]  HPI Here for a GYN exam. Her menses are regular, no unusual cramping or bleeding. She does note some whitish DC that appears either at the start or at the end of her cycles, and this causes itching and burning. Sometimes there is a slight odor involved. She has had yeast infections and BV infections in the past.    Review of Systems  Constitutional: Negative.   Respiratory: Negative.   Cardiovascular: Negative.   Genitourinary: Positive for vaginal discharge. Negative for vaginal pain.       Objective:   Physical Exam Constitutional:      Appearance: Normal appearance.  Cardiovascular:     Rate and Rhythm: Normal rate and regular rhythm.     Pulses: Normal pulses.     Heart sounds: Normal heart sounds.  Pulmonary:     Effort: Pulmonary effort is normal.     Breath sounds: Normal breath sounds.  Genitourinary:    General: Normal vulva.     Rectum: Normal.     Comments: Both breasts are normal with no tenderness or nipple DC or lumps. The vagina has a white odorless DC present  Neurological:     Mental Status: She is alert.           Assessment & Plan:  GYN exam. A Pap smear was obtained with HPV screening. She appears to have a yeast infection so we will treat with Diflucan. A wet prep is also pending.  Alysia Penna, MD

## 2019-01-19 NOTE — Addendum Note (Signed)
Addended by: Gwenyth Ober R on: 01/19/2019 04:24 PM   Modules accepted: Orders

## 2019-01-21 LAB — CERVICOVAGINAL ANCILLARY ONLY
Bacterial Vaginitis (gardnerella): POSITIVE — AB
Candida Glabrata: NEGATIVE
Candida Vaginitis: NEGATIVE
Molecular Disclaimer: NEGATIVE
Molecular Disclaimer: NEGATIVE
Molecular Disclaimer: NORMAL

## 2019-01-21 MED ORDER — METRONIDAZOLE 500 MG PO TABS: 500.0000 mg | ORAL_TABLET | Freq: Three times a day (TID) | ORAL | 0 refills | Status: DC

## 2019-01-21 NOTE — Addendum Note (Signed)
Addended by: Gwenyth Ober R on: 01/21/2019 05:57 PM   Modules accepted: Orders

## 2019-03-02 NOTE — Progress Notes (Signed)
After several failed attempts to contact patient for accurate insurance information this request has been deleted.

## 2019-04-12 ENCOUNTER — Encounter: Payer: Self-pay | Admitting: Family Medicine

## 2019-04-12 MED ORDER — AZITHROMYCIN 500 MG PO TABS: 1000.0000 mg | ORAL_TABLET | Freq: Once | ORAL | 0 refills | Status: AC

## 2019-04-12 NOTE — Telephone Encounter (Signed)
Per Dr.Fry abx was approved and provider updated.

## 2019-04-12 NOTE — Telephone Encounter (Signed)
Spoke to pt and gave update regarding Chlamydia. Pt verbalized understanding. Pt denies any sx and stated that she was not aware that she had it. Pt was advised to contact sexual partners and advised the it will be reported to health department. Pt also advised that we will send in abx event though she denies sx. Pt verbalized understanding.

## 2019-04-12 NOTE — Telephone Encounter (Signed)
PPW faxed to Health department.

## 2019-05-05 ENCOUNTER — Other Ambulatory Visit: Payer: Self-pay

## 2019-05-05 DIAGNOSIS — G43009 Migraine without aura, not intractable, without status migrainosus: Secondary | ICD-10-CM

## 2019-05-10 DIAGNOSIS — Z03818 Encounter for observation for suspected exposure to other biological agents ruled out: Secondary | ICD-10-CM | POA: Diagnosis not present

## 2019-05-23 ENCOUNTER — Encounter: Payer: Self-pay | Admitting: Neurology

## 2019-05-23 NOTE — Progress Notes (Addendum)
Virtual Visit via Video Note The purpose of this virtual visit is to provide medical care while limiting exposure to the novel coronavirus.    Consent was obtained for video visit:  Yes.   Answered questions that patient had about telehealth interaction:  Yes.   I discussed the limitations, risks, security and privacy concerns of performing an evaluation and management service by telemedicine. I also discussed with the patient that there may be a patient responsible charge related to this service. The patient expressed understanding and agreed to proceed.  Pt location: Home Physician Location: office Name of referring provider:  Nelwyn Salisbury, MD I connected with Shelby Nip Germany at patients initiation/request on 05/24/2019 at  9:50 AM EST by video enabled telemedicine application and verified that I am speaking with the correct person using two identifiers. Pt MRN:  [id number] Pt DOB:  09-Sep-1990 Video Participants:  Shelby Obrien   History of Present Illness:  Shelby Obrien is a 29 year old female with asthma, anxiety and migraines who follows up for migraines.Marland Kitchen  UPDATE:. She reports increased headaches beginning in December.  She states they are pounding and occurring every 2 to 3 days, lasting all day.  Turning over in bed even makes it worse.  She reports that the left side of her head feels like it is burning and notes left ear tinnitus with brief episodes of hearing loss in the left ear.  Migraines remain severe and occur 1 to 2 times a week.  They are lasting 1 day and sometimes 2 days. They are not responsive to Nurtec.    Rescue therapy:  Nurtec Current NSAIDS:ibuprofen 600mg  Current analgesics:none Current triptans:none Current ergotamine:no Current anti-emetic:Zofran 4mg  Current muscle relaxants:no Current anti-anxiolytic:no Current sleep aide:no Current Antihypertensive medications:no Current Antidepressant  medications:nortriptyline 50mg  Current Anticonvulsant medications:no Current anti-CGRP:Nurtec Current Vitamins/Herbal/Supplements:no Current Antihistamines/Decongestants:Flonase Other therapy:no Other medication:no  Caffeine:none Alcohol:no Smoker:Quit Diet:Water.  No soda or coffee Exercise:no Depression:no; Anxiety:no Other pain:Mild back pain Sleep hygiene:Poor. She has had a baby. Not breastfeeding. She hasn't been getting much sleep at night. She will try to get a little sleep in after taking her daughters to daycare.  HISTORY: Onset:  In her 28s Location:Frontal/temporal either side Quality:pounding InitialIntensity:10/10.Shedenies new headache, thunderclap headache Aura:no Prodrome:no Postdrome:tired Associated symptoms: Nausea vomiting, dizziness, photophobia, phonophobia, osmophobia, rarely sees black spots.Shedenies associatedautonomic symptoms orunilateral numbness or weakness. InitialDuration:3 days (lately 2 weeks) InitialFrequency:Every 2 weeks InitialFrequency of abortive medication:only when needed Triggers:  Unknown. Not associated with menstrual period Relieving factors:  Rest and sleep (sometimes) Activity:Routine daily activity aggravates.  Past NSAIDS:ibuprofen, naproxen Past analgesics:Tylenol, Excedrin Past abortive triptans:Sumatriptan 100mg (initially effective but stopped working); rizatriptan 10mg  Past abortive ergotamine:no Past muscle relaxants:no Past anti-emetic:no Past antihypertensive medications:no Past antidepressant medications:no Past anticonvulsant medications:no Past anti-CGRP:no Past vitamins/Herbal/Supplements:no Past antihistamines/decongestants:no Other past therapies:no  Family history of headache:Mother, sister (migraines), paternal cousins  Past Medical History: Past Medical History:  Diagnosis Date  . Asthma   .  Migraines   . Obesity    BMI>40    Medications: Outpatient Encounter Medications as of 05/24/2019  Medication Sig  . albuterol (PROVENTIL HFA;VENTOLIN HFA) 108 (90 Base) MCG/ACT inhaler Inhale 2 puffs into the lungs every 4 (four) hours as needed for wheezing or shortness of breath.  . fluticasone (FLONASE) 50 MCG/ACT nasal spray Place 2 sprays into both nostrils daily.  ibuprofen (ADVIL,MOTRIN) 600 MG tablet Take 1 tablet (600 mg total) by mouth every 6 (six) hours as needed.  . metroNIDAZOLE (  FLAGYL) 500 MG tablet Take 1 tablet (500 mg total) by mouth 3 (three) times daily.  . nortriptyline (PAMELOR) 50 MG capsule Take 1 capsule (50 mg total) by mouth at bedtime.  . Rimegepant Sulfate (NURTEC) 75 MG TBDP Take 1 tablet by mouth daily as needed (Maximum 1 tablet in 24 hours).   No facility-administered encounter medications on file as of 05/24/2019.    Allergies: Allergies  Allergen Reactions  . Shellfish Allergy Anaphylaxis  . Other     BLUEBERRIES, BANANAS, TREE NUTS, RASPBERRIES, GRAPES, POLLENS/SEASONAL.    Family History: Family History  Problem Relation Age of Onset  . Cervical cancer Mother   . Diabetes Maternal Grandmother   . Heart disease Maternal Grandmother   . Diabetes Paternal Grandmother   . Heart disease Paternal Grandmother   . Diabetes Paternal Uncle   . Diabetes Sister     Social History: Social History   Socioeconomic History  . Marital status: Single    Spouse name: Not on file  . Number of children: 2  . Years of education: 16  . Highest education level: Bachelor's degree (e.g., BA, AB, BS)  Occupational History  . Occupation: Airline pilot: [employer]  Tobacco Use  . Smoking status: Former Smoker    Packs/day: 0.00  . Smokeless tobacco: Never Used  Substance and Sexual Activity  . Alcohol use: No  . Drug use: No  . Sexual activity: Yes    Birth control/protection: None  Other Topics Concern  . Not on file  Social History  Narrative   Lives with boyfriend and 2 daughters in an apartment on the 2nd floor.  Works as a Training and development officer at [employer].  Education: college. She is right-handed. Caffeine - one glass every few weeks. She is active with her children and work.   Social Determinants of Health   Financial Resource Strain:   . Difficulty of Paying Living Expenses: Not on file  Food Insecurity:   . Worried About Charity fundraiser in the Last Year: Not on file  . Ran Out of Food in the Last Year: Not on file  Transportation Needs:   . Lack of Transportation (Medical): Not on file  . Lack of Transportation (Non-Medical): Not on file  Physical Activity:   . Days of Exercise per Week: Not on file  . Minutes of Exercise per Session: Not on file  Stress:   . Feeling of Stress : Not on file  Social Connections:   . Frequency of Communication with Friends and Family: Not on file  . Frequency of Social Gatherings with Friends and Family: Not on file  . Attends Religious Services: Not on file  . Active Member of Clubs or Organizations: Not on file  . Attends Archivist Meetings: Not on file  . Marital Status: Not on file  Intimate Partner Violence:   . Fear of Current or Ex-Partner: Not on file  . Emotionally Abused: Not on file  . Physically Abused: Not on file  . Sexually Abused: Not on file    Observations/Objective:   Height 5\' 4"  (1.626 m), weight 286 lb (129.7 kg), unknown if currently breastfeeding. No acute distress.  Alert and oriented.  Speech fluent and not dysarthric.  Language intact.   Assessment and Plan:   Migraine without aura, without status migrainosus, not intractable  1.  For preventative management, increase nortriptyline to 75mg  at bedtime 2.  For abortive therapy, will try Tosymra 3.  Limit  use of pain relievers to no more than 2 days out of week to prevent risk of rebound or medication-overuse headache. 4.  Keep headache diary 5.  Exercise, hydration, caffeine cessation,  sleep hygiene, monitor for and avoid triggers 6. Follow up 4 months  ADDENDUM:  CT head without contrast performed today after visit personally reviewed and unremarkable.   Follow Up Instructions:    -I discussed the assessment and treatment plan with the patient. The patient was provided an opportunity to ask questions and all were answered. The patient agreed with the plan and demonstrated an understanding of the instructions.   The patient was advised to call back or seek an in-person evaluation if the symptoms worsen or if the condition fails to improve as anticipated.   Cira Servant, DO

## 2019-05-24 ENCOUNTER — Ambulatory Visit
Admission: RE | Admit: 2019-05-24 | Discharge: 2019-05-24 | Disposition: A | Discharge status: Home / Self Care | Payer: BC Managed Care – PPO | Source: Ambulatory Visit | Attending: Neurology | Admitting: Neurology

## 2019-05-24 ENCOUNTER — Telehealth (INDEPENDENT_AMBULATORY_CARE_PROVIDER_SITE_OTHER): Payer: BC Managed Care – PPO | Admitting: Neurology

## 2019-05-24 ENCOUNTER — Other Ambulatory Visit: Payer: Self-pay

## 2019-05-24 VITALS — Ht 64.0 in | Wt 286.0 lb

## 2019-05-24 DIAGNOSIS — G43009 Migraine without aura, not intractable, without status migrainosus: Secondary | ICD-10-CM | POA: Diagnosis not present

## 2019-05-24 DIAGNOSIS — R519 Headache, unspecified: Secondary | ICD-10-CM | POA: Diagnosis not present

## 2019-05-24 MED ORDER — NORTRIPTYLINE HCL 75 MG PO CAPS: 75.0000 mg | ORAL_CAPSULE | Freq: Every day | ORAL | 0 refills | Status: DC

## 2019-05-24 MED ORDER — TOSYMRA 10 MG/ACT NA SOLN: 1.0000 | NASAL | 11 refills | Status: DC | PRN

## 2019-06-01 ENCOUNTER — Ambulatory Visit (INDEPENDENT_AMBULATORY_CARE_PROVIDER_SITE_OTHER): Payer: BC Managed Care – PPO | Admitting: Family Medicine

## 2019-06-01 ENCOUNTER — Encounter: Payer: Self-pay | Admitting: Family Medicine

## 2019-06-01 ENCOUNTER — Other Ambulatory Visit: Payer: Self-pay

## 2019-06-01 VITALS — BP 122/62 | HR 84 | Temp 97.2°F | Wt 289.8 lb

## 2019-06-01 DIAGNOSIS — E041 Nontoxic single thyroid nodule: Secondary | ICD-10-CM | POA: Diagnosis not present

## 2019-06-01 DIAGNOSIS — R252 Cramp and spasm: Secondary | ICD-10-CM | POA: Diagnosis not present

## 2019-06-01 DIAGNOSIS — L309 Dermatitis, unspecified: Secondary | ICD-10-CM

## 2019-06-01 LAB — BASIC METABOLIC PANEL
BUN: 15 mg/dL (ref 6–23)
CO2: 28 mEq/L (ref 19–32)
Calcium: 8.9 mg/dL (ref 8.4–10.5)
Chloride: 103 mEq/L (ref 96–112)
Creatinine, Ser: 0.83 mg/dL (ref 0.40–1.20)
GFR: 97.64 mL/min (ref >60.00)
Glucose, Bld: 78 mg/dL (ref 70–99)
Potassium: 4.3 mEq/L (ref 3.5–5.1)
Sodium: 137 mEq/L (ref 135–145)

## 2019-06-01 LAB — TSH: TSH: 1.98 u[IU]/mL (ref 0.35–4.50)

## 2019-06-01 LAB — CBC WITH DIFFERENTIAL/PLATELET
Basophils Absolute: 0.1 10*3/uL (ref 0.0–0.1)
Basophils Relative: 1.1 % (ref 0.0–3.0)
Eosinophils Absolute: 0.1 10*3/uL (ref 0.0–0.7)
Eosinophils Relative: 1.4 % (ref 0.0–5.0)
HCT: 34.6 % — ABNORMAL LOW (ref 36.0–46.0)
Hemoglobin: 11.3 g/dL — ABNORMAL LOW (ref 12.0–15.0)
Lymphocytes Relative: 24 % (ref 12.0–46.0)
Lymphs Abs: 1.6 10*3/uL (ref 0.7–4.0)
MCHC: 32.6 g/dL (ref 30.0–36.0)
MCV: 83.3 fl (ref 78.0–100.0)
Monocytes Absolute: 0.4 10*3/uL (ref 0.1–1.0)
Monocytes Relative: 5.8 % (ref 3.0–12.0)
Neutro Abs: 4.5 10*3/uL (ref 1.4–7.7)
Neutrophils Relative %: 67.7 % (ref 43.0–77.0)
Platelets: 245 10*3/uL (ref 150.0–400.0)
RBC: 4.15 Mil/uL (ref 3.87–5.11)
RDW: 16.4 % — ABNORMAL HIGH (ref 11.5–15.5)
WBC: 6.7 10*3/uL (ref 4.0–10.5)

## 2019-06-01 LAB — T3, FREE: T3, Free: 2.9 pg/mL (ref 2.3–4.2)

## 2019-06-01 LAB — T4, FREE: Free T4: 0.66 ng/dL (ref 0.60–1.60)

## 2019-06-01 LAB — MAGNESIUM: Magnesium: 1.7 mg/dL (ref 1.5–2.5)

## 2019-06-01 MED ORDER — TRIAMCINOLONE ACETONIDE 0.1 % EX CREA: 1.0000 "application " | TOPICAL_CREAM | Freq: Two times a day (BID) | CUTANEOUS | 2 refills | Status: DC

## 2019-06-01 NOTE — Progress Notes (Signed)
   Subjective:    Patient ID: Shelby Obrien, female    DOB: [birth year], 29 y.o.   MRN: [id number]  HPI Here for several reasons. First one month ago she developed an itchy rash on both hands. OTC lotions do not help. Second for the past 2 weeks she has noticed a tender swelling in the right neck area. This came up quickly and has been stable since then. This does not interfere with speaking or swallowing. Third she has had intermittent twitching of the right upper eyelid. She denies any particular stressors lately. She does note frequent cramps in the calves at night. No swelling in the legs or feet.    Review of Systems  Constitutional: Negative.   HENT: Negative for dental problem, mouth sores, postnasal drip, sore throat, trouble swallowing and voice change.   Respiratory: Negative.   Cardiovascular: Negative.   Endocrine: Negative.   Musculoskeletal: Positive for myalgias.  Neurological: Negative.        Objective:   Physical Exam Constitutional:      General: She is not in acute distress.    Appearance: Normal appearance.  HENT:     Head: Normocephalic and atraumatic.     Mouth/Throat:     Pharynx: Oropharynx is clear.  Eyes:     Extraocular Movements: Extraocular movements intact.     Conjunctiva/sclera: Conjunctivae normal.     Pupils: Pupils are equal, round, and reactive to light.     Comments: No eyelid twitching is observed today   Neck:     Comments: There is a firm lump over the right thyroid lobe which is tender  Cardiovascular:     Rate and Rhythm: Normal rate and regular rhythm.     Pulses: Normal pulses.     Heart sounds: Normal heart sounds.  Pulmonary:     Effort: Pulmonary effort is normal.     Breath sounds: Normal breath sounds.  Skin:    Comments: Hyperpigmentation and scaling on the dorsal surfaces of both hands and all fingers   Neurological:     General: No focal deficit present.     Mental Status: She is alert and oriented to person,  place, and time.           Assessment & Plan:  She has eczema on the hands, and we will treat this with Triamcinolone cream. She has a tender thyroid nodule, so we will check TSH and free T3 and free T4 today. Set up a thyroid US soon. For the eyelid twitching and leg cramps, we will check a BMET and MG today. I suggested she take 1-2 400 mg magneisum tablets at bedtime.  Gershon Crane, MD

## 2019-06-04 ENCOUNTER — Encounter: Payer: Self-pay | Admitting: Family Medicine

## 2019-06-09 ENCOUNTER — Emergency Department (HOSPITAL_COMMUNITY)
Admission: EM | Admit: 2019-06-09 | Discharge: 2019-06-10 | Disposition: A | Discharge status: Home / Self Care | Payer: BC Managed Care – PPO | Attending: Emergency Medicine | Admitting: Emergency Medicine

## 2019-06-09 ENCOUNTER — Other Ambulatory Visit: Payer: Self-pay

## 2019-06-09 ENCOUNTER — Encounter (HOSPITAL_COMMUNITY): Payer: Self-pay | Admitting: Emergency Medicine

## 2019-06-09 ENCOUNTER — Encounter: Payer: Self-pay | Admitting: Family Medicine

## 2019-06-09 ENCOUNTER — Ambulatory Visit (INDEPENDENT_AMBULATORY_CARE_PROVIDER_SITE_OTHER): Payer: BC Managed Care – PPO | Admitting: Family Medicine

## 2019-06-09 VITALS — BP 130/70 | HR 85 | Temp 97.6°F | Wt 288.8 lb

## 2019-06-09 DIAGNOSIS — G43809 Other migraine, not intractable, without status migrainosus: Secondary | ICD-10-CM

## 2019-06-09 DIAGNOSIS — R519 Headache, unspecified: Secondary | ICD-10-CM | POA: Diagnosis not present

## 2019-06-09 DIAGNOSIS — M542 Cervicalgia: Secondary | ICD-10-CM | POA: Diagnosis not present

## 2019-06-09 MED ORDER — CYCLOBENZAPRINE HCL 10 MG PO TABS: 10.0000 mg | ORAL_TABLET | Freq: Three times a day (TID) | ORAL | 1 refills | Status: DC | PRN

## 2019-06-09 MED ORDER — METHYLPREDNISOLONE 4 MG PO TBPK: ORAL_TABLET | ORAL | 0 refills | Status: DC

## 2019-06-09 NOTE — ED Triage Notes (Signed)
Patient reports migraine headache for 4 days , denies emesis , mild nausea with photophobia , denies fever or chills .

## 2019-06-09 NOTE — Telephone Encounter (Signed)
Noted  

## 2019-06-09 NOTE — Progress Notes (Signed)
   Subjective:    Patient ID: Shelby Obrien, female    DOB: 1990-09-20, 29 y.o.   MRN: [id number]  HPI Here for one week of sharp pains that begin in the right posterior neck and run all the way done the right arm. She also has numbness and tingling in the lateral right forearm and in the 4th and 5th right fingers. Heat has not helped. No recent trauma.    Review of Systems  Constitutional: Negative.   Respiratory: Negative.   Cardiovascular: Negative.   Musculoskeletal: Positive for neck pain.  Neurological: Positive for numbness.       Objective:   Physical Exam Constitutional:      General: She is not in acute distress.    Appearance: Normal appearance.  Cardiovascular:     Rate and Rhythm: Normal rate and regular rhythm.     Pulses: Normal pulses.     Heart sounds: Normal heart sounds.  Pulmonary:     Effort: Pulmonary effort is normal.     Breath sounds: Normal breath sounds.  Musculoskeletal:     Comments: The right upper trapezius is tender, the neck has full ROM   Neurological:     Mental Status: She is alert.           Assessment & Plan:  Radicular pain from a pinched nerve in the neck. Try Flexeril and a Medrol dose pack. Recheck prn.  Gershon Crane, MD

## 2019-06-09 NOTE — ED Provider Notes (Signed)
[facility] EMERGENCY DEPARTMENT Provider Note   CSN: [id number] Arrival date & time: 06/09/19  1918     History Chief Complaint  Patient presents with  . Migraine    Shelby Obrien is a 29 y.o. female with a hx of asthma, migraine headache (followed by [facility]), obesity presents to the Emergency Department complaining of gradual, persistent, progressively worsening throbbing headache onset 4 days ago.  Pt also reports ringing in her left ear and a burning sensation in the left face.  She reports she has been getting the ringing since the last several migraines.  Associated symptoms also include nausea, photophobia, phonophobia.  Pt denies thunderclap headache.  Pt reports she feels some dizzy but this is normal with her migraines.  She has taken 3 doses of sumatriptan IN without relief.  Nothing makes her symptoms better. Pt denies fever, chills, neck stiffness, chest pain, SOB, abd pain, vomiting, diarrhea, weakness, numbness, tingling, vision changes or syncope.     The history is provided by the patient and medical records. No language interpreter was used.       Past Medical History:  Diagnosis Date  . Asthma   . Migraines   . Obesity    BMI>40    Patient Active Problem List   Diagnosis Date Noted  . Eczema 06/01/2019  . Postpartum care following vaginal delivery 10/03/2017  . Abdominal pain in pregnancy 09/23/2017  . Anxiety and depression 09/04/2017  . Nausea and vomiting in pregnancy 03/05/2017  . Supervision of high risk pregnancy, antepartum, first trimester 02/26/2017  . Maternal obesity affecting pregnancy, antepartum 02/26/2017  . BMI 40.0-44.9, adult (HCC) 02/26/2017  . Migraines 10/07/2016  . Asthma 10/07/2016    Past Surgical History:  Procedure Laterality Date  . WISDOM TOOTH EXTRACTION       OB History    Gravida  3   Para  2   Term  2   Preterm      AB  1   Living  2     SAB  1   TAB      Ectopic      Multiple  0   Live Births  2           Family History  Problem Relation Age of Onset  . Cervical cancer Mother   . Diabetes Maternal Grandmother   . Heart disease Maternal Grandmother   . Diabetes Paternal Grandmother   . Heart disease Paternal Grandmother   . Diabetes Paternal Uncle   . Diabetes Sister     Social History   Tobacco Use  . Smoking status: Former Smoker    Packs/day: 0.00  . Smokeless tobacco: Never Used  Substance Use Topics  . Alcohol use: No  . Drug use: No    Home Medications Prior to Admission medications   Medication Sig Start Date End Date Taking? Authorizing Provider  albuterol (PROVENTIL HFA;VENTOLIN HFA) 108 (90 Base) MCG/ACT inhaler Inhale 2 puffs into the lungs every 4 (four) hours as needed for wheezing or shortness of breath. 03/31/18   Burchette, Elberta Fortis, MD  cyclobenzaprine (FLEXERIL) 10 MG tablet Take 1 tablet (10 mg total) by mouth 3 (three) times daily as needed for muscle spasms. 06/09/19   Nelwyn Salisbury, MD  fluticasone (FLONASE) 50 MCG/ACT nasal spray Place 2 sprays into both nostrils daily. 12/06/18   Anselm Pancoast, PA-C  methylPREDNISolone (MEDROL DOSEPAK) 4 MG TBPK tablet As directed 06/09/19   Gershon Crane  A, MD  metroNIDAZOLE (FLAGYL) 500 MG tablet Take 1 tablet (500 mg total) by mouth 3 (three) times daily. 01/21/19   Nelwyn Salisbury, MD  nortriptyline (PAMELOR) 75 MG capsule Take 1 capsule (75 mg total) by mouth at bedtime. 05/24/19   Drema Dallas, DO  Rimegepant Sulfate (NURTEC) 75 MG TBDP Take 1 tablet by mouth daily as needed (Maximum 1 tablet in 24 hours). 12/20/18   Drema Dallas, DO  SUMAtriptan (TOSYMRA) 10 MG/ACT SOLN Place 1 spray into the nose every hour as needed (Maximum 3 sprays in 24 hours.). 05/24/19   Drema Dallas, DO  triamcinolone cream (KENALOG) 0.1 % Apply 1 application topically 2 (two) times daily. 06/01/19   Nelwyn Salisbury, MD    Allergies    Shellfish allergy, Banana, Blueberry flavor, Other, and  Raspberry  Review of Systems   Review of Systems  Constitutional: Negative for appetite change, diaphoresis, fatigue, fever and unexpected weight change.  HENT: Positive for tinnitus. Negative for mouth sores.   Eyes: Negative for visual disturbance.  Respiratory: Negative for cough, chest tightness, shortness of breath and wheezing.   Cardiovascular: Negative for chest pain.  Gastrointestinal: Negative for abdominal pain, constipation, diarrhea, nausea and vomiting.  Endocrine: Negative for polydipsia, polyphagia and polyuria.  Genitourinary: Negative for dysuria, frequency, hematuria and urgency.  Musculoskeletal: Negative for back pain and neck stiffness.  Skin: Negative for rash.  Allergic/Immunologic: Negative for immunocompromised state.  Neurological: Positive for light-headedness and headaches. Negative for syncope.  Hematological: Does not bruise/bleed easily.  Psychiatric/Behavioral: Negative for sleep disturbance. The patient is not nervous/anxious.     Physical Exam Updated Vital Signs BP (!) 138/109   Pulse 74   Temp 98.6 F (37 C) (Oral)   Resp 12   LMP 06/05/2019   SpO2 100%   Physical Exam Vitals and nursing note reviewed.  Constitutional:      General: She is not in acute distress.    Appearance: She is well-developed. She is not diaphoretic.  HENT:     Head: Normocephalic and atraumatic.     Right Ear: Hearing, tympanic membrane and ear canal normal.     Left Ear: Hearing, tympanic membrane and ear canal normal.  Eyes:     General: No scleral icterus.    Conjunctiva/sclera: Conjunctivae normal.     Pupils: Pupils are equal, round, and reactive to light.     Comments: No horizontal, vertical or rotational nystagmus  Neck:     Comments: Full active and passive ROM. No midline tenderness.  Mild left-sided paraspinal tenderness. No nuchal rigidity or meningeal signs Cardiovascular:     Rate and Rhythm: Normal rate and regular rhythm.  Pulmonary:      Effort: Pulmonary effort is normal. No respiratory distress.     Breath sounds: Normal breath sounds. No wheezing or rales.  Abdominal:     General: Bowel sounds are normal.     Palpations: Abdomen is soft.     Tenderness: There is no abdominal tenderness. There is no guarding or rebound.  Musculoskeletal:        General: Normal range of motion.     Cervical back: Normal range of motion and neck supple. Muscular tenderness present.  Lymphadenopathy:     Cervical: No cervical adenopathy.  Skin:    General: Skin is warm and dry.     Findings: No rash.  Neurological:     Mental Status: She is alert and oriented to person, place, and time.  Cranial Nerves: No cranial nerve deficit.     Motor: No abnormal muscle tone.     Coordination: Coordination normal.     Comments: Mental Status:  Alert, oriented, thought content appropriate. Speech fluent without evidence of aphasia. Able to follow 2 step commands without difficulty.  Cranial Nerves:  II:  Peripheral visual fields grossly normal, pupils equal, round, reactive to light III,IV, VI: ptosis not present, extra-ocular motions intact bilaterally  V,VII: smile symmetric, facial light touch sensation equal VIII: hearing grossly normal bilaterally  IX,X: midline uvula rise  XI: bilateral shoulder shrug equal and strong XII: midline tongue extension  Motor:  5/5 in upper and lower extremities bilaterally including strong and equal grip strength and dorsiflexion/plantar flexion Sensory: light touch normal in all extremities.  Cerebellar: normal finger-to-nose with bilateral upper extremities Gait: Gait testing deferred CV: distal pulses palpable throughout   Psychiatric:        Behavior: Behavior normal.        Thought Content: Thought content normal.        Judgment: Judgment normal.     ED Results / Procedures / Treatments   Labs (all labs ordered are listed, but only abnormal results are displayed) Labs Reviewed  I-STAT BETA  HCG BLOOD, ED ([facility], [facility], [facility] ONLY)    Procedures Procedures (including critical care time)  Medications Ordered in ED Medications  sodium chloride 0.9 % bolus 1,000 mL (1,000 mLs Intravenous New Bag/Given 06/10/19 0025)  prochlorperazine (COMPAZINE) injection 10 mg (10 mg Intravenous Given 06/10/19 0025)  diphenhydrAMINE (BENADRYL) injection 25 mg (25 mg Intravenous Given 06/10/19 0026)  dexamethasone (DECADRON) injection 10 mg (10 mg Intravenous Given 06/10/19 0026)  ketorolac (TORADOL) 30 MG/ML injection 30 mg (30 mg Intravenous Given 06/10/19 0026)    ED Course  I have reviewed the triage vital signs and the nursing notes.  Pertinent labs & imaging results that were available during my care of the patient were reviewed by me and considered in my medical decision making (see chart for details).    MDM Rules/Calculators/A&P                      Patient presents with headache.  She has a history of migraines and reports this is similar.  Neurologic exam is reassuring.  No thunderclap headache to suggest intracranial hemorrhage.  Will give migraine cocktail and reassess.  Patient is afebrile without nuchal rigidity.  No concern for meningitis.  1:00 AM Patient is feeling better.  Will allow her to continue to rest.  2:45 AM Patient reports complete resolution of her headache.  In addition the burning sensation in her face and ringing in her ear has resolved.  She no longer feels dizzy and is ambulatory here without difficulty.  Encourage close follow-up with neurology and reasons to return immediately to the emergency department.  Patient states understanding and is in agreement with the plan.  Final Clinical Impression(s) / ED Diagnoses Final diagnoses:  Other migraine without status migrainosus, not intractable    Rx / DC Orders ED Discharge Orders    None       Juniper Snyders, Gwenlyn Perking 06/10/19 0250    Palumbo, April, MD 06/10/19 3151

## 2019-06-09 NOTE — Telephone Encounter (Signed)
She is scheduled to see me today at 9:30

## 2019-06-10 LAB — I-STAT BETA HCG BLOOD, ED (MC, WL, AP ONLY): I-stat hCG, quantitative: <5 m[IU]/mL (ref <5)

## 2019-06-10 MED ORDER — KETOROLAC TROMETHAMINE 30 MG/ML IJ SOLN
30.0000 mg | Freq: Once | INTRAMUSCULAR | Status: AC
  Administered 2019-06-10: 30 mg via INTRAVENOUS
  Filled 2019-06-10: qty 1

## 2019-06-10 MED ORDER — DIPHENHYDRAMINE HCL 50 MG/ML IJ SOLN
25.0000 mg | Freq: Once | INTRAMUSCULAR | Status: AC
  Administered 2019-06-10: 25 mg via INTRAVENOUS
  Filled 2019-06-10: qty 1

## 2019-06-10 MED ORDER — PROCHLORPERAZINE EDISYLATE 10 MG/2ML IJ SOLN
10.0000 mg | Freq: Once | INTRAMUSCULAR | Status: AC
  Administered 2019-06-10: 10 mg via INTRAVENOUS
  Filled 2019-06-10: qty 2

## 2019-06-10 MED ORDER — DEXAMETHASONE SODIUM PHOSPHATE 10 MG/ML IJ SOLN
10.0000 mg | Freq: Once | INTRAMUSCULAR | Status: AC
  Administered 2019-06-10: 10 mg via INTRAVENOUS
  Filled 2019-06-10: qty 1

## 2019-06-10 MED ORDER — SODIUM CHLORIDE 0.9 % IV BOLUS
1000.0000 mL | Freq: Once | INTRAVENOUS | Status: AC
  Administered 2019-06-10: 1000 mL via INTRAVENOUS

## 2019-06-10 NOTE — Discharge Instructions (Addendum)
1. Medications: usual home medications 2. Treatment: rest, drink plenty of fluids,  3. Follow Up: Please followup with urology in 1 week.  Please return to the ER for new or worsening symptoms, persistent headaches, difficulty speaking, walking or other concerns.

## 2019-06-17 ENCOUNTER — Ambulatory Visit
Admission: RE | Admit: 2019-06-17 | Discharge: 2019-06-17 | Disposition: A | Discharge status: Home / Self Care | Payer: BC Managed Care – PPO | Source: Ambulatory Visit | Attending: Family Medicine | Admitting: Family Medicine

## 2019-06-17 DIAGNOSIS — E041 Nontoxic single thyroid nodule: Secondary | ICD-10-CM

## 2019-06-28 ENCOUNTER — Encounter: Payer: Self-pay | Admitting: Neurology

## 2019-06-28 NOTE — Progress Notes (Addendum)
Deserie Vasey (Key: [id number]) Tosymra 10MG /ACT solution   Form [employer] 4-Part NCPDP Electronic PA Form Created 8 days ago Sent to Plan 6 hours ago Plan Response 6 hours ago Submit Clinical Questions 6 hours ago Determination Favorable 2 hours ago Message from Plan PA Case: , Status: Approved, Coverage Starts on: 06/28/2019 12:00:00 AM, Coverage Ends on: 06/27/2020 12:00:00 AM.

## 2019-07-20 ENCOUNTER — Encounter: Payer: Self-pay | Admitting: Family Medicine

## 2019-07-20 ENCOUNTER — Other Ambulatory Visit: Payer: Self-pay | Admitting: *Deleted

## 2019-07-20 MED ORDER — ALBUTEROL SULFATE HFA 108 (90 BASE) MCG/ACT IN AERS: 2.0000 | INHALATION_SPRAY | RESPIRATORY_TRACT | 2 refills | Status: DC | PRN

## 2019-07-28 IMAGING — US US OB COMP LESS 14 WK
1 series · 14 of 28 positions shown · non-contrast
Comparison: 02/12/2017

CLINICAL DATA: Vaginal bleeding with known intrauterine pregnancy.

EXAM:
OBSTETRIC <14 WK US AND TRANSVAGINAL OB US
TECHNIQUE: Both transabdominal and transvaginal ultrasound examinations were
performed for complete evaluation of the gestation as well as the
maternal uterus, adnexal regions, and pelvic cul-de-sac.
Transvaginal technique was performed to assess early pregnancy.

[Series 1: us ob comp less 14 wk · 0.22mm/px · 14 of 57 slices shown]
[im 3/57]
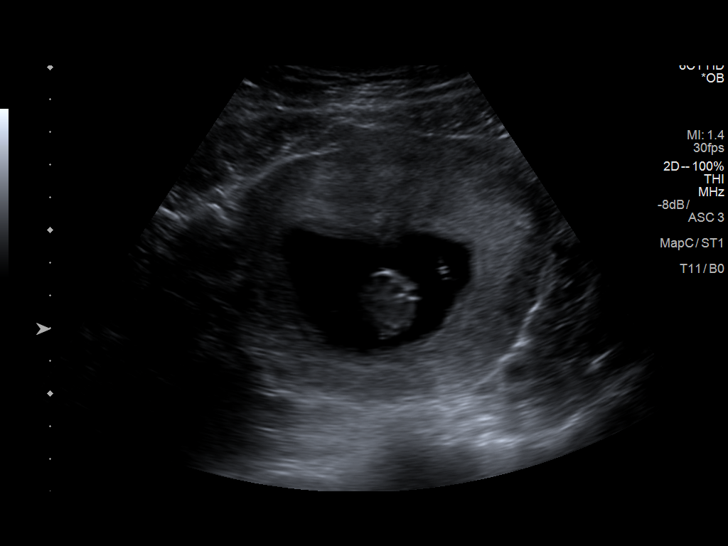
[im 7/57]
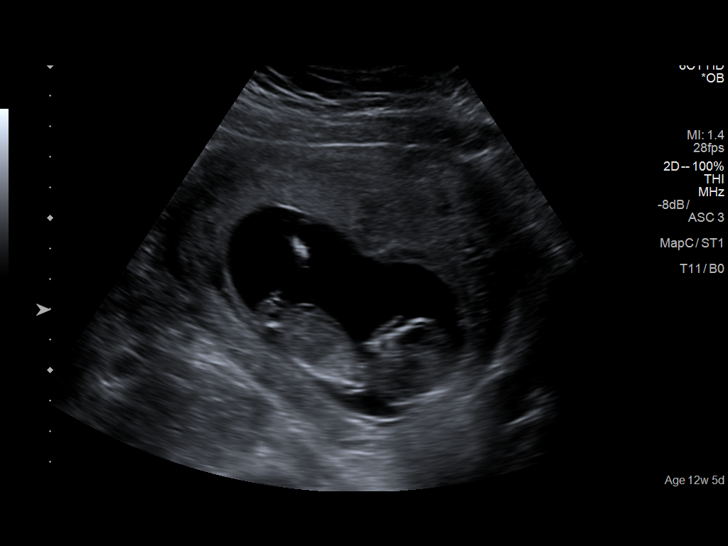
[im 11/57]
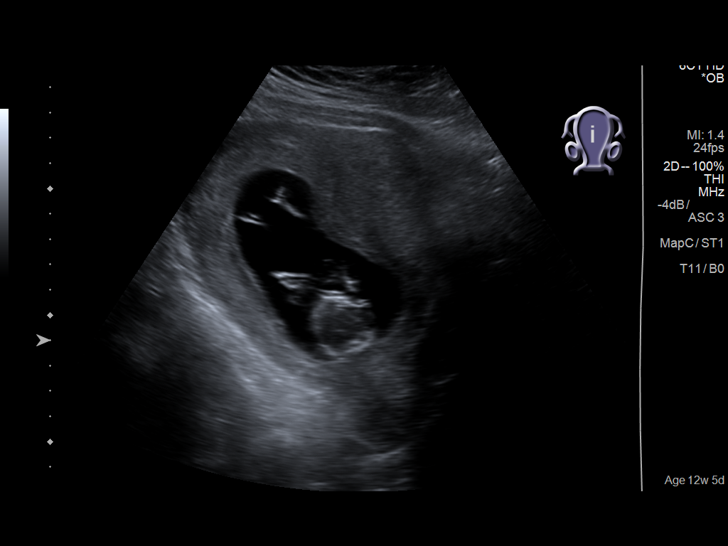
[im 15/57]
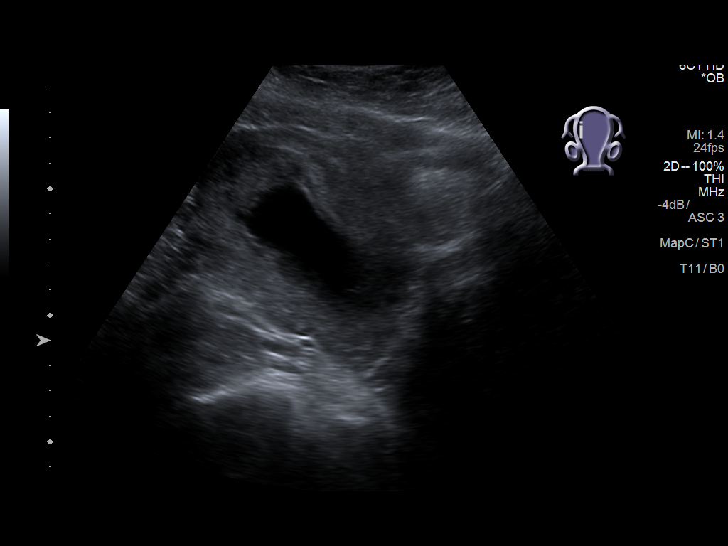
[im 19/57]
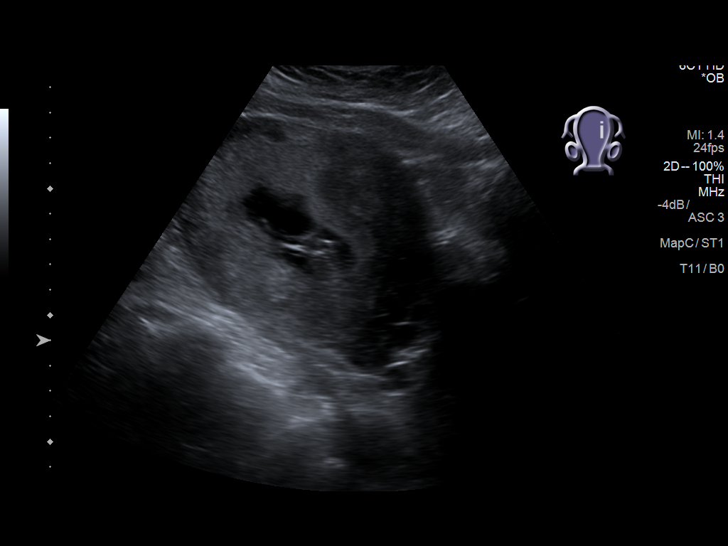
[im 23/57]
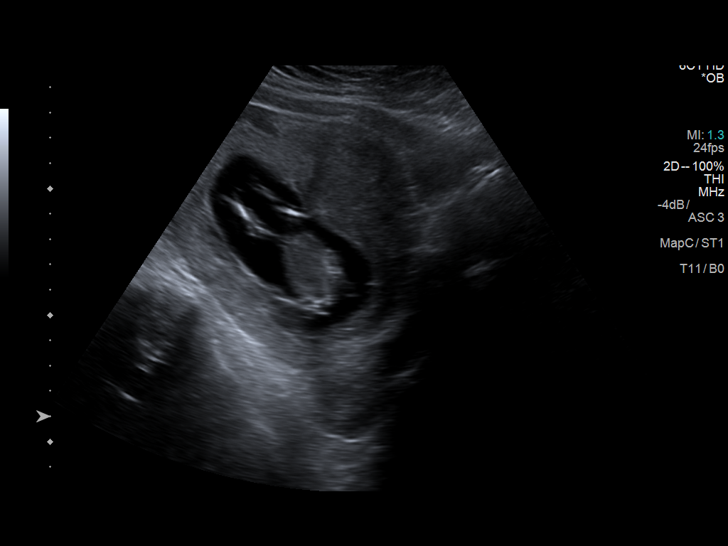
[im 27/57]
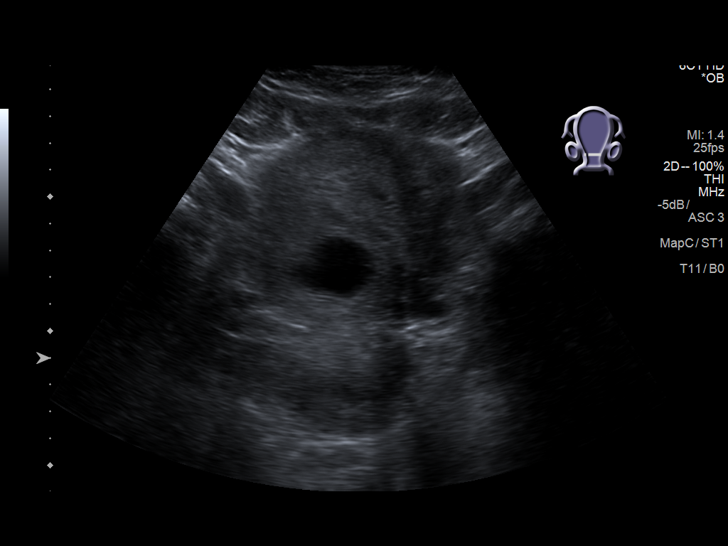
[im 32/57]
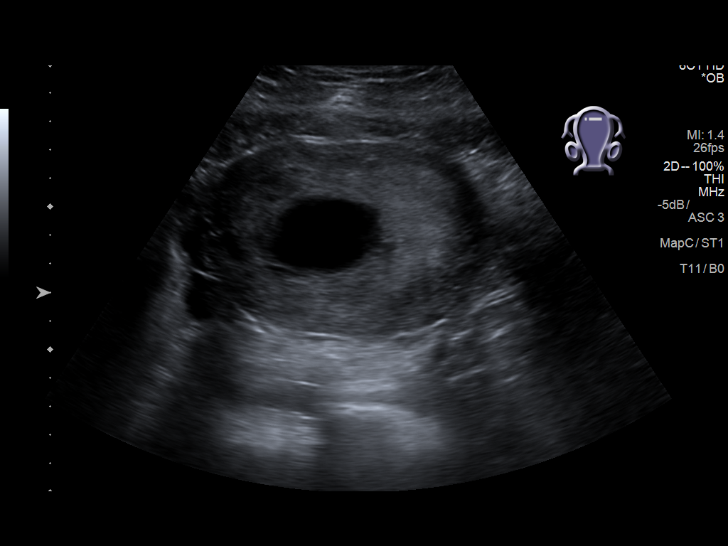
[im 36/57]
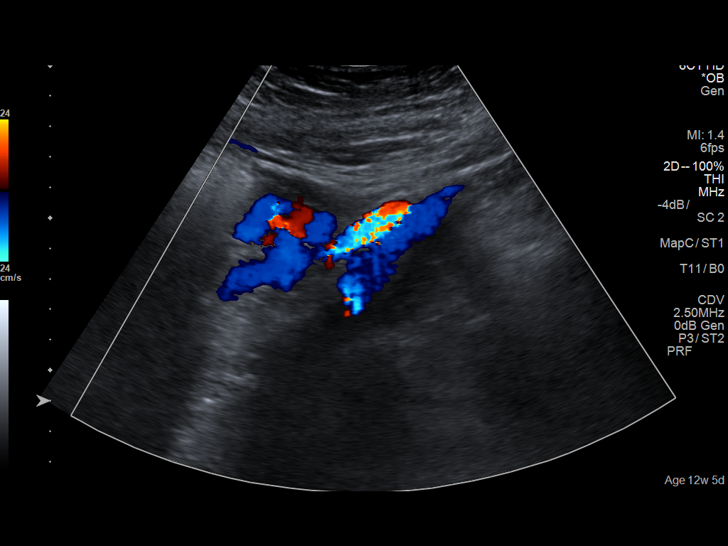
[im 40/57]
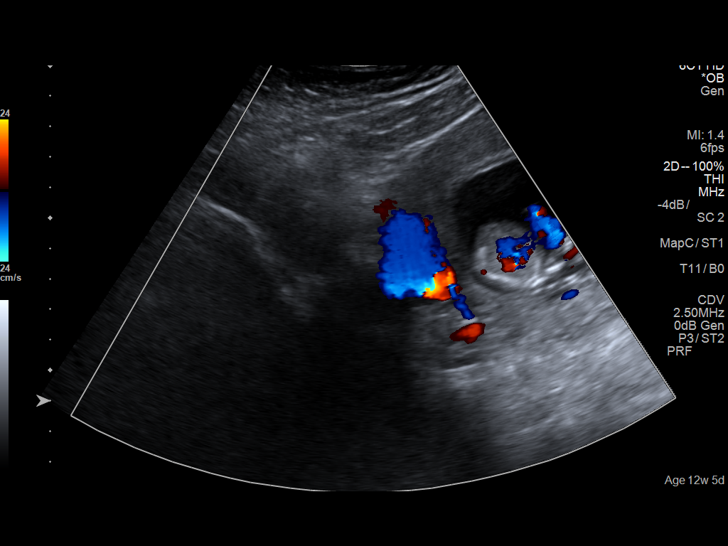
[im 44/57]
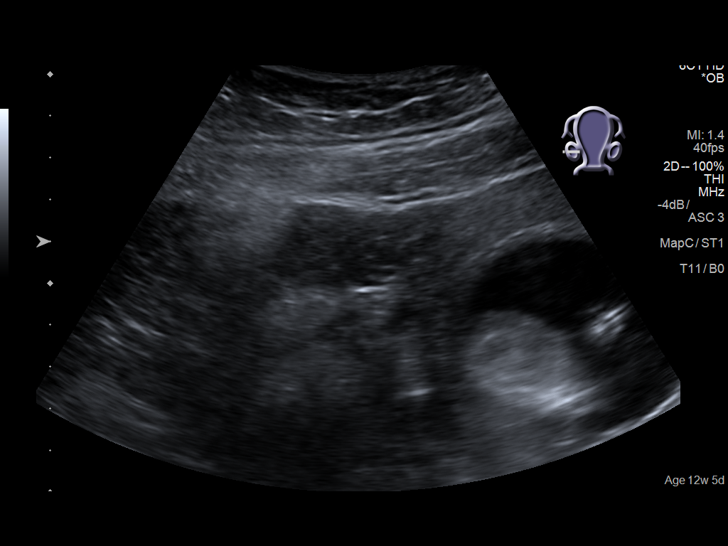
[im 48/57]
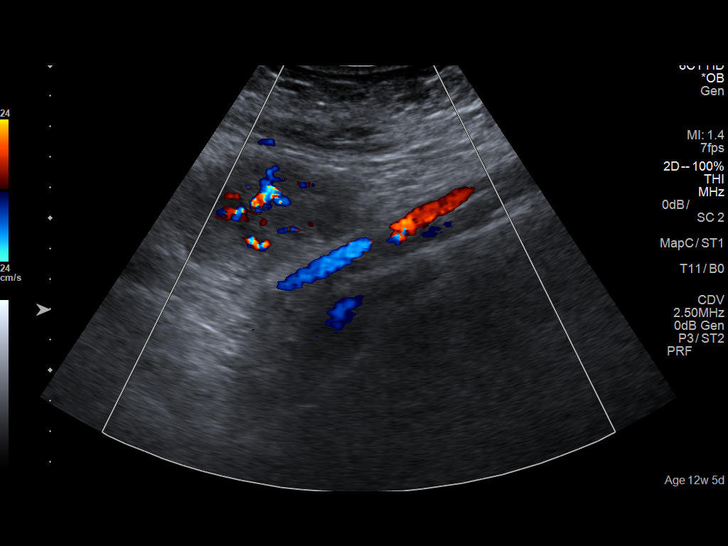
[im 52/57]
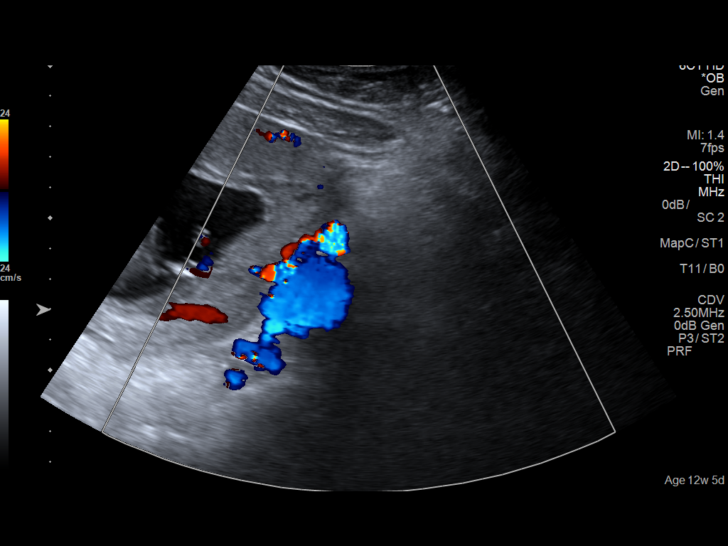
[im 57/57]
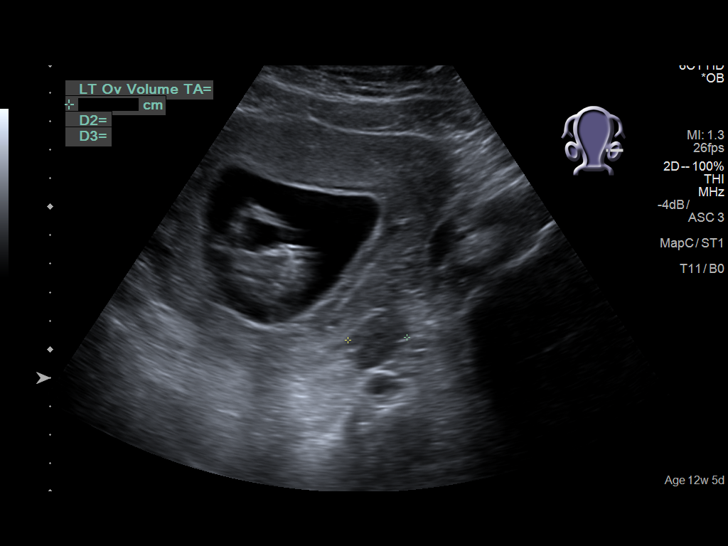

[14 of 28 positions shown; findings below may reference images not displayed]

FINDINGS: Intrauterine gestational sac: Single.

Yolk sac:  Not visualized.

Embryo:  Visualized.

Cardiac Activity: Visualized.

Heart Rate: 149  bpm

CRL:  6.9  cm   13 w   1 d                  US EDC: 10/11/2017.

Subchorionic hemorrhage:  None visualized.

Maternal uterus/adnexae: Unremarkable.
IMPRESSION: Single living intrauterine gestation at estimated 13 week 1 day
gestational age by crown-rump length. No evidence for subchorionic
hemorrhage.

## 2019-08-04 ENCOUNTER — Ambulatory Visit: Payer: BC Managed Care – PPO | Attending: Internal Medicine

## 2019-08-04 DIAGNOSIS — Z23 Encounter for immunization: Secondary | ICD-10-CM

## 2019-08-04 NOTE — Progress Notes (Signed)
   Covid-19 Vaccination Clinic  Name:  Shelby Obrien    MRN: [id number] DOB: 01-Mar-1991  08/04/2019  [name] was observed post Covid-19 immunization for 30 minutes based on pre-vaccination screening without incident. She was provided with Vaccine Information Sheet and instruction to access the V-Safe system.   [name] was instructed to call 911 with any severe reactions post vaccine: Marland Kitchen Difficulty breathing  . Swelling of face and throat  . A fast heartbeat  . A bad rash all over body  . Dizziness and weakness   Immunizations Administered    Name Date Dose VIS Date Route   Pfizer COVID-19 Vaccine 08/04/2019 10:23 AM 0.3 mL 04/08/2019 Intramuscular   Manufacturer: [employer], [employer]   Lot: [id number]   NDC: [address]-9

## 2019-08-29 ENCOUNTER — Ambulatory Visit: Payer: BC Managed Care – PPO | Attending: Internal Medicine

## 2019-08-29 DIAGNOSIS — Z23 Encounter for immunization: Secondary | ICD-10-CM

## 2019-08-29 NOTE — Progress Notes (Signed)
   Covid-19 Vaccination Clinic  Name:  Shelby Obrien    MRN: [id number] DOB: Dec 27, 1990  08/29/2019  [name] was observed post Covid-19 immunization for 30 minutes based on pre-vaccination screening without incident. She was provided with Vaccine Information Sheet and instruction to access the V-Safe system.   [name] was instructed to call 911 with any severe reactions post vaccine: Marland Kitchen Difficulty breathing  . Swelling of face and throat  . A fast heartbeat  . A bad rash all over body  . Dizziness and weakness   Immunizations Administered    Name Date Dose VIS Date Route   Pfizer COVID-19 Vaccine 08/29/2019  4:43 PM 0.3 mL 06/22/2018 Intramuscular   Manufacturer: [employer], [employer]   Lot: [id number]   NDC: [address]-7

## 2019-09-20 NOTE — Progress Notes (Signed)
Virtual Visit via Video Note The purpose of this virtual visit is to provide medical care while limiting exposure to the novel coronavirus.    Consent was obtained for video visit:  Yes.   Answered questions that patient had about telehealth interaction:  Yes.   I discussed the limitations, risks, security and privacy concerns of performing an evaluation and management service by telemedicine. I also discussed with the patient that there may be a patient responsible charge related to this service. The patient expressed understanding and agreed to proceed.  Pt location: Home Physician Location: office Name of referring provider:  Nelwyn Salisbury, MD I connected with Shelby Obrien at patients initiation/request on 09/21/2019 at  8:30 AM EDT by video enabled telemedicine application and verified that I am speaking with the correct person using two identifiers. Pt MRN:  [id number] Pt DOB:  12-31-1990 Video Participants:  Shelby Obrien   History of Present Illness:  Shelby Obrien is a 29 year old female with asthma, anxiety and migraines whofollows up for migraines.Marland Kitchen  UPDATE:. Endorsed increased headaches beginning in December.  In January, nortriptyline was increased to 75mg  at bedtime and she was prescribed Tosymra for rescue.  CT head from 05/24/2019 was unremarkable.  She presented to the ED on 06/09/2019 after an intractable migraine.  She was successfully treated with a headache cocktail.    Intensity:  Severe Duration:  10-15 minutes sometimes up to an hour Frequency:  2 in last 30 days Rescue therapy: Tosymra Current NSAIDS:ibuprofen 600mg  Current analgesics:none Current triptans:Tosymra Current ergotamine:no Current anti-emetic:Zofran 4mg  Current muscle relaxants:no Current anti-anxiolytic:no Current sleep aide:no Current Antihypertensive medications:no Current Antidepressant medications:nortriptyline50mg  Current Anticonvulsant  medications:no Current anti-CGRP:Nurtec (not taking) Current Vitamins/Herbal/Supplements:no Current Antihistamines/Decongestants:Flonase Other therapy:no Other medication:no  Caffeine:none Alcohol:no Smoker:Quit Diet:Water.  No soda or coffee Exercise:no Depression:no; Anxiety:no Other pain:Mild back pain Sleep hygiene:Poor. She has had a baby. Not breastfeeding. She hasn't been getting much sleep at night. She will try to get a little sleep in after taking her daughters to daycare.  HISTORY: Onset: In her 30s Location:Frontal/temporal either side Quality:pounding InitialIntensity:10/10.Shedenies new headache, thunderclap headache Aura:no Prodrome:no Postdrome:tired Associated symptoms:Nauseavomiting, dizziness, photophobia, phonophobia, osmophobia, rarely sees black spots.  On occasion, burning sensation in left side of head/face with tinnitus and brief episode of hearing loss in left ear.  Shedenies associatedautonomic symptoms orunilateral numbness or weakness. InitialDuration:3 days (lately 2 weeks) InitialFrequency:Every 2 weeks InitialFrequency of abortive medication:only when needed Triggers: Unknown. Not associated with menstrual period Relieving factors: Rest and sleep (sometimes) Activity:Routine daily activity aggravates.  Past NSAIDS:ibuprofen, naproxen Past analgesics:Tylenol, Excedrin Past abortive triptans:Sumatriptan 100mg (initially effective but stopped working); rizatriptan 10mg  Past abortive ergotamine:no Past muscle relaxants:no Past anti-emetic:no Past antihypertensive medications:no Past antidepressant medications:no Past anticonvulsant medications:no Past anti-CGRP:no Past vitamins/Herbal/Supplements:no Past antihistamines/decongestants:no Other past therapies:no  Family history of headache:Mother, sister (migraines), paternal cousins  PAST  MEDICAL HISTORY: Past Medical History:  Diagnosis Date  . Asthma   . Migraines   . Obesity    BMI>40    MEDICATIONS: Current Outpatient Medications on File Prior to Visit  Medication Sig Dispense Refill  . albuterol (VENTOLIN HFA) 108 (90 Base) MCG/ACT inhaler Inhale 2 puffs into the lungs every 4 (four) hours as needed for wheezing or shortness of breath. 6.7 g 2  . cyclobenzaprine (FLEXERIL) 10 MG tablet Take 1 tablet (10 mg total) by mouth 3 (three) times daily as needed for muscle spasms. 60 tablet 1  . fluticasone (FLONASE) 50 MCG/ACT nasal spray Place  2 sprays into both nostrils daily. 16 g 2  . methylPREDNISolone (MEDROL DOSEPAK) 4 MG TBPK tablet As directed 21 tablet 0  . metroNIDAZOLE (FLAGYL) 500 MG tablet Take 1 tablet (500 mg total) by mouth 3 (three) times daily. 21 tablet 0  . nortriptyline (PAMELOR) 75 MG capsule Take 1 capsule (75 mg total) by mouth at bedtime. 45 capsule 0  . Rimegepant Sulfate (NURTEC) 75 MG TBDP Take 1 tablet by mouth daily as needed (Maximum 1 tablet in 24 hours). 8 tablet 5  . SUMAtriptan (TOSYMRA) 10 MG/ACT SOLN Place 1 spray into the nose every hour as needed (Maximum 3 sprays in 24 hours.). 6 each 11  . triamcinolone cream (KENALOG) 0.1 % Apply 1 application topically 2 (two) times daily. 45 g 2   No current facility-administered medications on file prior to visit.    ALLERGIES: Allergies  Allergen Reactions  . Shellfish Allergy Anaphylaxis  . Banana   . Blueberry Flavor   . Other     BLUEBERRIES, BANANAS, TREE NUTS, RASPBERRIES, GRAPES, POLLENS/SEASONAL.  Marland Kitchen Raspberry     FAMILY HISTORY: Family History  Problem Relation Age of Onset  . Cervical cancer Mother   . Diabetes Maternal Grandmother   . Heart disease Maternal Grandmother   . Diabetes Paternal Grandmother   . Heart disease Paternal Grandmother   . Diabetes Paternal Uncle   . Diabetes Sister     SOCIAL HISTORY: Social History   Socioeconomic History  . Marital status:  Single    Spouse name: Not on file  . Number of children: 2  . Years of education: 16  . Highest education level: Bachelor's degree (e.g., BA, AB, BS)  Occupational History  . Occupation: Airline pilot: [employer]  Tobacco Use  . Smoking status: Former Smoker    Packs/day: 0.00  . Smokeless tobacco: Never Used  Substance and Sexual Activity  . Alcohol use: No  . Drug use: No  . Sexual activity: Yes    Birth control/protection: None  Other Topics Concern  . Not on file  Social History Narrative   Lives with boyfriend and 2 daughters in an apartment on the 2nd floor.  Works as a Training and development officer at [employer].  Education: college. She is right-handed. Caffeine - one glass every few weeks. She is active with her children and work.   Social Determinants of Health   Financial Resource Strain:   . Difficulty of Paying Living Expenses:   Food Insecurity:   . Worried About Charity fundraiser in the Last Year:   . Arboriculturist in the Last Year:   Transportation Needs:   . Film/video editor (Medical):   Marland Kitchen Lack of Transportation (Non-Medical):   Physical Activity:   . Days of Exercise per Week:   . Minutes of Exercise per Session:   Stress:   . Feeling of Stress :   Social Connections:   . Frequency of Communication with Friends and Family:   . Frequency of Social Gatherings with Friends and Family:   . Attends Religious Services:   . Active Member of Clubs or Organizations:   . Attends Archivist Meetings:   Marland Kitchen Marital Status:   Intimate Partner Violence:   . Fear of Current or Ex-Partner:   . Emotionally Abused:   Marland Kitchen Physically Abused:   . Sexually Abused:     OBJECTIVE: Height 5\' 5"  (1.651 m), weight 280 lb (127 kg), unknown if currently breastfeeding.  General: No acute distress.  Patient appears well-groomed.   Romberg negative.  IMPRESSION: Migraine without aura, without status migrainosus, not intractable.  PLAN: 1.  For preventative management,  nortriptyline 75mg  at bedtime 2.  For abortive therapy, Tosymra 3.  Limit use of pain relievers to no more than 2 days out of week to prevent risk of rebound or medication-overuse headache. 4.  Keep headache diary 5.  Exercise, hydration, caffeine cessation, sleep hygiene, monitor for and avoid triggers 6.  Follow up 6 months  Follow Up Instructions:    -I discussed the assessment and treatment plan with the patient. The patient was provided an opportunity to ask questions and all were answered. The patient agreed with the plan and demonstrated an understanding of the instructions.   The patient was advised to call back or seek an in-person evaluation if the symptoms worsen or if the condition fails to improve as anticipated.   , DO

## 2019-09-21 ENCOUNTER — Other Ambulatory Visit: Payer: Self-pay

## 2019-09-21 ENCOUNTER — Encounter: Payer: Self-pay | Admitting: Neurology

## 2019-09-21 ENCOUNTER — Telehealth (INDEPENDENT_AMBULATORY_CARE_PROVIDER_SITE_OTHER): Payer: BC Managed Care – PPO | Admitting: Neurology

## 2019-09-21 VITALS — Ht 65.0 in | Wt 280.0 lb

## 2019-09-21 DIAGNOSIS — G43009 Migraine without aura, not intractable, without status migrainosus: Secondary | ICD-10-CM | POA: Diagnosis not present

## 2020-01-23 ENCOUNTER — Ambulatory Visit: Payer: BC Managed Care – PPO | Admitting: Family Medicine

## 2020-01-31 ENCOUNTER — Ambulatory Visit: Payer: BC Managed Care – PPO | Admitting: Family Medicine

## 2020-02-13 ENCOUNTER — Telehealth: Payer: Self-pay | Admitting: Neurology

## 2020-02-13 DIAGNOSIS — G43009 Migraine without aura, not intractable, without status migrainosus: Secondary | ICD-10-CM

## 2020-02-13 MED ORDER — PREDNISONE 10 MG (21) PO TBPK: ORAL_TABLET | ORAL | 0 refills | Status: DC

## 2020-02-13 NOTE — Telephone Encounter (Signed)
Patient left message on the VM stating that she is having sharp stabbing pain on the left side of her head above the ear. None of her medication is working please call

## 2020-02-13 NOTE — Telephone Encounter (Signed)
I can prescribe her a prednisone taper to break current intractable headache - 10mg  tablet:  60mg  on day 1, then 50mg  on day 2, then 40mg  on day 3, then 30mg  on day 4, then 20mg  on day 5, then 10mg  on day 6, then STOP

## 2020-02-13 NOTE — Telephone Encounter (Signed)
Script sent into [facility]

## 2020-02-13 NOTE — Telephone Encounter (Signed)
Telephone call to pt, Pt states the pain started about week ago, On the left side above the ear. Pt states th pain has moved right  above the left eye. No numbness or tingling reported. The Tosumra not helping right now.  Please advise.

## 2020-03-01 ENCOUNTER — Telehealth: Payer: BC Managed Care – PPO | Admitting: Family

## 2020-03-01 DIAGNOSIS — R112 Nausea with vomiting, unspecified: Secondary | ICD-10-CM | POA: Diagnosis not present

## 2020-03-01 MED ORDER — PROMETHAZINE HCL 25 MG PO TABS: 25.0000 mg | ORAL_TABLET | Freq: Three times a day (TID) | ORAL | 0 refills | Status: DC | PRN

## 2020-03-01 NOTE — Progress Notes (Signed)
We are sorry that you are not feeling well. Here is how we plan to help!  Based on what you have shared with me it looks like you have a Virus that is irritating your GI tract.  Vomiting is the forceful emptying of a portion of the stomach's content through the mouth.  Although nausea and vomiting can make you feel miserable, it's important to remember that these are not diseases, but rather symptoms of an underlying illness.  When we treat short term symptoms, we always caution that any symptoms that persist should be fully evaluated in a medical office.  I have prescribed a medication that will help alleviate your symptoms and allow you to stay hydrated:  Promethazine 25 mg take 1 tablet twice daily   Thank you for letting me know about the negative pregnancy tests. This medication can make you drowsy so please be at home when you take it. Please see your PCP about the breast pain.   HOME CARE:  Drink clear liquids.  This is very important! Dehydration (the lack of fluid) can lead to a serious complication.  Start off with 1 tablespoon every 5 minutes for 8 hours.  You may begin eating bland foods after 8 hours without vomiting.  Start with saltine crackers, white bread, rice, mashed potatoes, applesauce.  After 48 hours on a bland diet, you may resume a normal diet.  Try to go to sleep.  Sleep often empties the stomach and relieves the need to vomit.  GET HELP RIGHT AWAY IF:   Your symptoms do not improve or worsen within 2 days after treatment.  You have a fever for over 3 days.  You cannot keep down fluids after trying the medication.  MAKE SURE YOU:   Understand these instructions.  Will watch your condition.  Will get help right away if you are not doing well or get worse.   Thank you for choosing an e-visit. Your e-visit answers were reviewed by a board certified advanced clinical practitioner to complete your personal care plan. Depending upon the condition, your plan  could have included both over the counter or prescription medications. Please review your pharmacy choice. Be sure that the pharmacy you have chosen is open so that you can pick up your prescription now.  If there is a problem you may message your provider in MyChart to have the prescription routed to another pharmacy. Your safety is important to [place]. If you have drug allergies check your prescription carefully.  For the next 24 hours, you can use MyChart to ask questions about today's visit, request a non-urgent call back, or ask for a work or school excuse from your e-visit provider. You will get an e-mail in the next two days asking about your experience. I hope that your e-visit has been valuable and will speed your recovery.  Greater than 5 minutes, yet less than 10 minutes of time have been spent researching, coordinating, and implementing care for this patient.

## 2020-03-20 NOTE — Progress Notes (Deleted)
NEUROLOGY FOLLOW UP OFFICE NOTE  Shelby Obrien [id number]   Subjective:  Shelby Obrien is a32year old female with asthma, anxiety and migraines whofollows up for migraine.  UPDATE:.  Intensity:  Severe Duration:  10-15 minutes sometimes up to an hour Frequency:  2 in last 30 days She did have an intractable headache lasting a week in October before treated with a prednisone taper.  Rescue therapy:Tosymra Current NSAIDS:ibuprofen 600mg  Current analgesics:none Current triptans:Tosymra Current ergotamine:no Current anti-emetic:Zofran 4mg  Current muscle relaxants:no Current anti-anxiolytic:no Current sleep aide:no Current Antihypertensive medications:no Current Antidepressant medications:nortriptyline75mg  Current Anticonvulsant medications:no Current anti-CGRP:Nurtec (not taking) Current Vitamins/Herbal/Supplements:no Current Antihistamines/Decongestants:Flonase Other therapy:no Other medication:no  Caffeine:none Alcohol:no Smoker:Quit Diet:Water. No soda or coffee Exercise:no Depression:no; Anxiety:no Other pain:Mild back pain Sleep hygiene:Poor. She has had a baby. Not breastfeeding. She hasn't been getting much sleep at night. She will try to get a little sleep in after taking her daughters to daycare.  HISTORY: Onset: In her 16s Location:Frontal/temporal either side Quality:pounding InitialIntensity:10/10.Shedenies new headache, thunderclap headache Aura:no Prodrome:no Postdrome:tired Associated symptoms:Nauseavomiting, dizziness, photophobia, phonophobia, osmophobia, rarely sees black spots.  On occasion, burning sensation in left side of head/face with tinnitus and brief episode of hearing loss in left ear.  Shedenies associatedautonomic symptoms orunilateral numbness or weakness. InitialDuration:3 days (lately 2 weeks) InitialFrequency:Every 2  weeks InitialFrequency of abortive medication:only when needed Triggers: Unknown. Not associated with menstrual period Relieving factors: Rest and sleep (sometimes) Activity:Routine daily activity aggravates.  CT head from 05/24/2019 was unremarkable.   Past NSAIDS:ibuprofen, naproxen Past analgesics:Tylenol, Excedrin Past abortive triptans:Sumatriptan 100mg (initially effective but stopped working); rizatriptan 10mg  Past abortive ergotamine:no Past muscle relaxants:no Past anti-emetic:no Past antihypertensive medications:no Past antidepressant medications:no Past anticonvulsant medications:no Past anti-CGRP:no Past vitamins/Herbal/Supplements:no Past antihistamines/decongestants:no Other past therapies:no  Family history of headache:Mother, sister (migraines), paternal cousins  PAST MEDICAL HISTORY: Past Medical History:  Diagnosis Date  . Asthma   . Migraines   . Obesity    BMI>40    MEDICATIONS: Current Outpatient Medications on File Prior to Visit  Medication Sig Dispense Refill  . albuterol (VENTOLIN HFA) 108 (90 Base) MCG/ACT inhaler Inhale 2 puffs into the lungs every 4 (four) hours as needed for wheezing or shortness of breath. 6.7 g 2  . cyclobenzaprine (FLEXERIL) 10 MG tablet Take 1 tablet (10 mg total) by mouth 3 (three) times daily as needed for muscle spasms. 60 tablet 1  . fluticasone (FLONASE) 50 MCG/ACT nasal spray Place 2 sprays into both nostrils daily. 16 g 2  . methylPREDNISolone (MEDROL DOSEPAK) 4 MG TBPK tablet As directed (Patient not taking: Reported on 09/21/2019) 21 tablet 0  . metroNIDAZOLE (FLAGYL) 500 MG tablet Take 1 tablet (500 mg total) by mouth 3 (three) times daily. 21 tablet 0  . nortriptyline (PAMELOR) 75 MG capsule Take 1 capsule (75 mg total) by mouth at bedtime. 45 capsule 0  . predniSONE (STERAPRED UNI-PAK 21 TAB) 10 MG (21) TBPK tablet 10mg  tablet:  60mg  on day 1, then 50mg  on day 2, then 40mg  on day  3, then 30mg  on day 4, then 20mg  on day 5, then 10mg  on day 6, then STOP 21 tablet 0  . promethazine (PHENERGAN) 25 MG tablet Take 1 tablet (25 mg total) by mouth every 8 (eight) hours as needed for nausea or vomiting. 15 tablet 0  . Rimegepant Sulfate (NURTEC) 75 MG TBDP Take 1 tablet by mouth daily as needed (Maximum 1 tablet in 24 hours). 8 tablet 5  . SUMAtriptan (TOSYMRA) 10 MG/ACT SOLN  Place 1 spray into the nose every hour as needed (Maximum 3 sprays in 24 hours.). 6 each 11  . triamcinolone cream (KENALOG) 0.1 % Apply 1 application topically 2 (two) times daily. 45 g 2   No current facility-administered medications on file prior to visit.    ALLERGIES: Allergies  Allergen Reactions  . Shellfish Allergy Anaphylaxis  . Banana   . Blueberry Flavor   . Other     BLUEBERRIES, BANANAS, TREE NUTS, RASPBERRIES, GRAPES, POLLENS/SEASONAL.  Marland Kitchen Raspberry     FAMILY HISTORY: Family History  Problem Relation Age of Onset  . Cervical cancer Mother   . Diabetes Maternal Grandmother   . Heart disease Maternal Grandmother   . Diabetes Paternal Grandmother   . Heart disease Paternal Grandmother   . Diabetes Paternal Uncle   . Diabetes Sister    SOCIAL HISTORY: Social History   Socioeconomic History  . Marital status: Single    Spouse name: Not on file  . Number of children: 2  . Years of education: 16  . Highest education level: Bachelor's degree (e.g., BA, AB, BS)  Occupational History  . Occupation: Multimedia programmer: [employer]  Tobacco Use  . Smoking status: Former Smoker    Packs/day: 0.00  . Smokeless tobacco: Never Used  Vaping Use  . Vaping Use: Never used  Substance and Sexual Activity  . Alcohol use: No  . Drug use: No  . Sexual activity: Yes    Birth control/protection: None  Other Topics Concern  . Not on file  Social History Narrative   Lives with boyfriend and 2 daughters in an apartment on the 2nd floor.  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  Education:  college. She is right-handed. Caffeine - one glass every few weeks. She is active with her children and work.   Social Determinants of Health   Financial Resource Strain:   . Difficulty of Paying Living Expenses: Not on file  Food Insecurity:   . Worried About Programme researcher, broadcasting/film/video in the Last Year: Not on file  . Ran Out of Food in the Last Year: Not on file  Transportation Needs:   . Lack of Transportation (Medical): Not on file  . Lack of Transportation (Non-Medical): Not on file  Physical Activity:   . Days of Exercise per Week: Not on file  . Minutes of Exercise per Session: Not on file  Stress:   . Feeling of Stress : Not on file  Social Connections:   . Frequency of Communication with Friends and Family: Not on file  . Frequency of Social Gatherings with Friends and Family: Not on file  . Attends Religious Services: Not on file  . Active Member of Clubs or Organizations: Not on file  . Attends Banker Meetings: Not on file  . Marital Status: Not on file  Intimate Partner Violence:   . Fear of Current or Ex-Partner: Not on file  . Emotionally Abused: Not on file  . Physically Abused: Not on file  . Sexually Abused: Not on file     Objective:  *** General: No acute distress.  Patient appears well-groomed.   Head:  Normocephalic/atraumatic Eyes:  Fundi examined but not visualized Neck: supple, no paraspinal tenderness, full range of motion Heart:  Regular rate and rhythm Lungs:  Clear to auscultation bilaterally Back: No paraspinal tenderness Neurological Exam: alert and oriented to person, place, and time. Attention span and concentration intact, recent and remote memory  intact, fund of knowledge intact.  Speech fluent and not dysarthric, language intact.  CN II-XII intact. Bulk and tone normal, muscle strength 5/5 throughout.  Sensation to light touch, temperature and vibration intact.  Deep tendon reflexes 2+ throughout, toes downgoing.  Finger to nose and  heel to shin testing intact.  Gait normal, Romberg negative.   Assessment/Plan:   Migraine without aura, without status migrainosus, not intractable  1.  For preventative management, *** 2.  For abortive therapy, *** 3.  Limit use of pain relievers to no more than 2 days out of week to prevent risk of rebound or medication-overuse headache. 4.  Keep headache diary 5.  Follow up ***   Shon Millet, DO  CC: Gershon Crane, MD

## 2020-03-26 ENCOUNTER — Ambulatory Visit: Payer: BC Managed Care – PPO | Admitting: Neurology

## 2020-07-02 ENCOUNTER — Encounter: Payer: Self-pay | Admitting: Neurology

## 2020-07-02 NOTE — Progress Notes (Signed)
Shelby Obrien (Key: [id number]) Tosymra 10MG /ACT solution   Form PA Form (2017 [employer]) Created 4 days ago Sent to Plan 4 days ago Plan Response 4 days ago Submit Clinical Questions 4 days ago Determination Favorable 2 days ago Message from Plan PA Case: 02-15-2005, Status: Approved, Coverage Starts on: 06/30/2020 12:00:00 AM, Coverage Ends on: 06/30/2021 12:00:00 AM.

## 2020-09-14 ENCOUNTER — Telehealth: Payer: BC Managed Care – PPO | Admitting: Family

## 2020-09-14 DIAGNOSIS — J4521 Mild intermittent asthma with (acute) exacerbation: Secondary | ICD-10-CM

## 2020-09-14 MED ORDER — PREDNISONE 20 MG PO TABS: 40.0000 mg | ORAL_TABLET | Freq: Every day | ORAL | 0 refills | Status: AC

## 2020-09-14 MED ORDER — ALBUTEROL SULFATE HFA 108 (90 BASE) MCG/ACT IN AERS: 2.0000 | INHALATION_SPRAY | Freq: Four times a day (QID) | RESPIRATORY_TRACT | 0 refills | Status: DC | PRN

## 2020-09-14 NOTE — Progress Notes (Signed)
Visit for Asthma  Based on what you have shared with me, it looks like you may have a flare up of your asthma.  Asthma is a chronic (ongoing) lung disease which results in airway obstruction, inflammation and hyper-responsiveness.   Asthma symptoms vary from person to person, with common symptoms including nighttime awakening and decreased ability to participate in normal activities as a result of shortness of breath. It is often triggered by changes in weather, changes in the season, changes in air temperature, or inside (home, school, daycare or work) allergens such as animal dander, mold, mildew, woodstoves or cockroaches.   It can also be triggered by hormonal changes, extreme emotion, physical exertion or an upper respiratory tract illness.     It is important to identify the trigger, and then eliminate or avoid the trigger if possible.   If you have been prescribed medications to be taken on a regular basis, it is important to follow the asthma action plan and to follow guidelines to adjust medication in response to increasing symptoms of decreased peak expiratory flow rate  Treatment: I have prescribed: Albuterol (Proventil HFA; Ventolin HFA) 108 (90 Base) MCG/ACT Inhaler 2 puffs into the lungs every six hours as needed for wheezing or shortness of breath and Prednisone 40mg by mouth per day for 5  days  HOME CARE Only take medications as instructed by your medical team. Consider wearing a mask or scarf to improve breathing air temperature have been shown to decrease irritation and decrease exacerbations Get rest. Taking a steamy shower or using a humidifier may help nasal congestion sand ease sore throat pain. You can place a towel over your head and breathe in the steam from hot water coming from a faucet. Using a saline nasal spray works much the same way.  Cough drops, hare  candies and sore throat lozenges may ease your cough.  Avoid close contacts especially the very you and the elderly Cover your mouth if you cough or sneeze Always remember to wash your hands.    GET HELP RIGHT AWAY IF: You develop worsening symptoms; breathlessness at rest, drowsy, confused or agitated, unable to speak in full sentences You have coughing fits You develop a severe headache or visual changes You develop shortness of breath, difficulty breathing or start having chest pain Your symptoms persist after you have completed your treatment plan If your symptoms do not improve within 10 days  MAKE SURE YOU Understand these instructions. Will watch your condition. Will get help right away if you are not doing well or get worse.   Your e-visit answers were reviewed by a board certified advanced clinical practitioner to complete your personal care plan, Depending upon the condition, your plan could have included both over the counter or prescription medications.   Please review your pharmacy choice. Your safety is important to us. If you have drug allergies check your prescription carefully.  You can use MyChart to ask questions about today's visit, request a non-urgent  call back, or ask for a work or school excuse for 24 hours related to this e-Visit. If it has been greater than 24 hours you will need to follow up with your provider, or enter a new e-Visit to address those concerns.   You will get an e-mail in the next two days asking about your experience. I hope that your e-visit has been valuable and will speed your recovery. Thank you for using e-visits.   Approximately 5 minutes was spent documenting and reviewing   patient's chart.   

## 2020-10-06 ENCOUNTER — Other Ambulatory Visit: Payer: Self-pay | Admitting: Family

## 2020-10-06 DIAGNOSIS — J4521 Mild intermittent asthma with (acute) exacerbation: Secondary | ICD-10-CM

## 2020-11-17 ENCOUNTER — Other Ambulatory Visit: Payer: Self-pay

## 2020-11-17 ENCOUNTER — Emergency Department (HOSPITAL_COMMUNITY)
Admission: EM | Admit: 2020-11-17 | Discharge: 2020-11-17 | Disposition: A | Discharge status: Home / Self Care | Payer: BC Managed Care – PPO | Attending: Emergency Medicine | Admitting: Emergency Medicine

## 2020-11-17 DIAGNOSIS — Z87891 Personal history of nicotine dependence: Secondary | ICD-10-CM | POA: Insufficient documentation

## 2020-11-17 DIAGNOSIS — J45909 Unspecified asthma, uncomplicated: Secondary | ICD-10-CM | POA: Diagnosis not present

## 2020-11-17 DIAGNOSIS — U071 COVID-19: Secondary | ICD-10-CM | POA: Diagnosis not present

## 2020-11-17 DIAGNOSIS — R059 Cough, unspecified: Secondary | ICD-10-CM | POA: Diagnosis not present

## 2020-11-17 LAB — RESP PANEL BY RT-PCR (FLU A&B, COVID) ARPGX2
Influenza A by PCR: NEGATIVE
Influenza B by PCR: NEGATIVE
SARS Coronavirus 2 by RT PCR: POSITIVE — AB

## 2020-11-17 NOTE — ED Triage Notes (Signed)
Pt arrives with generalized body aches and sore throat onset this morning. Pt's children have covid and ear infection.

## 2020-11-17 NOTE — ED Provider Notes (Signed)
[facility] EMERGENCY DEPARTMENT Provider Note   CSN: [id number] Arrival date & time: 11/17/20  0747     History Chief Complaint  Patient presents with   Sore Throat   Generalized Body Aches    Shelby Obrien is a 30 y.o. female.  Presents with cough congestion sore throat, symptoms started yesterday.  She states that she is vaccinated for COVID.  Denies vomiting or diarrhea.      Past Medical History:  Diagnosis Date   Asthma    Migraines    Obesity    BMI>40    Patient Active Problem List   Diagnosis Date Noted   Eczema 06/01/2019   Postpartum care following vaginal delivery 10/03/2017   Abdominal pain in pregnancy 09/23/2017   Anxiety and depression 09/04/2017   Nausea and vomiting in pregnancy 03/05/2017   Supervision of high risk pregnancy, antepartum, first trimester 02/26/2017   Maternal obesity affecting pregnancy, antepartum 02/26/2017   BMI 40.0-44.9, adult (HCC) 02/26/2017   Migraines 10/07/2016   Asthma 10/07/2016    Past Surgical History:  Procedure Laterality Date   WISDOM TOOTH EXTRACTION       OB History     Gravida  3   Para  2   Term  2   Preterm      AB  1   Living  2      SAB  1   IAB      Ectopic      Multiple  0   Live Births  2           Family History  Problem Relation Age of Onset   Cervical cancer Mother    Diabetes Maternal Grandmother    Heart disease Maternal Grandmother    Diabetes Paternal Grandmother    Heart disease Paternal Grandmother    Diabetes Paternal Uncle    Diabetes Sister     Social History   Tobacco Use   Smoking status: Former    Packs/day: 0.00    Types: Cigarettes   Smokeless tobacco: Never  Vaping Use   Vaping Use: Never used  Substance Use Topics   Alcohol use: No   Drug use: No    Home Medications Prior to Admission medications   Medication Sig Start Date End Date Taking? Authorizing Provider  albuterol (VENTOLIN HFA) 108 (90 Base)  MCG/ACT inhaler Inhale 2 puffs into the lungs every 6 (six) hours as needed for wheezing or shortness of breath. 09/14/20   Jannifer Rodney A, FNP  cyclobenzaprine (FLEXERIL) 10 MG tablet Take 1 tablet (10 mg total) by mouth 3 (three) times daily as needed for muscle spasms. 06/09/19   Nelwyn Salisbury, MD  fluticasone (FLONASE) 50 MCG/ACT nasal spray Place 2 sprays into both nostrils daily. 12/06/18   Joy, Shawn C, PA-C  methylPREDNISolone (MEDROL DOSEPAK) 4 MG TBPK tablet As directed Patient not taking: Reported on 09/21/2019 06/09/19   Nelwyn Salisbury, MD  metroNIDAZOLE (FLAGYL) 500 MG tablet Take 1 tablet (500 mg total) by mouth 3 (three) times daily. 01/21/19   Nelwyn Salisbury, MD  nortriptyline (PAMELOR) 75 MG capsule Take 1 capsule (75 mg total) by mouth at bedtime. 05/24/19   Drema Dallas, DO  promethazine (PHENERGAN) 25 MG tablet Take 1 tablet (25 mg total) by mouth every 8 (eight) hours as needed for nausea or vomiting. 03/01/20   Olive Bass, FNP  Rimegepant Sulfate (NURTEC) 75 MG TBDP Take 1 tablet by mouth daily  as needed (Maximum 1 tablet in 24 hours). 12/20/18   Drema Dallas, DO  SUMAtriptan (TOSYMRA) 10 MG/ACT SOLN Place 1 spray into the nose every hour as needed (Maximum 3 sprays in 24 hours.). 05/24/19   Drema Dallas, DO  triamcinolone cream (KENALOG) 0.1 % Apply 1 application topically 2 (two) times daily. 06/01/19   Nelwyn Salisbury, MD    Allergies    Shellfish allergy, Banana, Blueberry flavor, Other, and Raspberry  Review of Systems   Review of Systems  Constitutional:  Positive for fever.  HENT:  Negative for ear pain.   Eyes:  Negative for pain.  Respiratory:  Positive for cough and shortness of breath.   Cardiovascular:  Negative for chest pain.  Gastrointestinal:  Negative for abdominal pain.  Genitourinary:  Negative for flank pain.  Musculoskeletal:  Negative for back pain.  Skin:  Negative for rash.  Neurological:  Negative for headaches.   Physical Exam Updated  Vital Signs BP (!) 150/97 (BP Location: Right Arm)   Pulse 78   Temp 99 F (37.2 C) (Oral)   Resp 16   Ht 5\' 4"  (1.626 m)   Wt 124.7 kg   SpO2 100%   BMI 47.20 kg/m   Physical Exam Constitutional:      General: She is not in acute distress.    Appearance: Normal appearance.  HENT:     Head: Normocephalic.     Nose: Nose normal.  Eyes:     Extraocular Movements: Extraocular movements intact.  Cardiovascular:     Rate and Rhythm: Normal rate.  Pulmonary:     Effort: Pulmonary effort is normal.  Musculoskeletal:        General: Normal range of motion.     Cervical back: Normal range of motion.  Neurological:     General: No focal deficit present.     Mental Status: She is alert. Mental status is at baseline.    ED Results / Procedures / Treatments   Labs (all labs ordered are listed, but only abnormal results are displayed) Labs Reviewed  RESP PANEL BY RT-PCR (FLU A&B, COVID) ARPGX2 - Abnormal; Notable for the following components:      Result Value   SARS Coronavirus 2 by RT PCR POSITIVE (*)    All other components within normal limits    EKG None  Radiology No results found.  Procedures Procedures   Medications Ordered in ED Medications - No data to display  ED Course  I have reviewed the triage vital signs and the nursing notes.  Pertinent labs & imaging results that were available during my care of the patient were reviewed by me and considered in my medical decision making (see chart for details).    MDM Rules/Calculators/A&P                           Patient is COVID-positive here today likely because of his symptoms.  Recommending isolation at home fluid hydration and Tylenol Motrin as needed for symptoms.  Recommended for monitoring and immediate return for difficulty breathing or any additional concerns.  advised her to follow-up with her doctor on a video conference basis within the next 2 to 4 days.  Final Clinical Impression(s) / ED  Diagnoses Final diagnoses:  COVID-19 virus infection    Rx / DC Orders ED Discharge Orders     None        , MD 11/17/20 [phone]

## 2020-11-17 NOTE — Discharge Instructions (Signed)
If you had any testing done today, the results will show up on your MyChart phone app in 24 hours.  Call your primary care doctor in the next 1-2 days to arrange video follow-up.   Use a finger pulse oximeter at home.  You may purchase one at [facility] or [facility] or online.  If the numbers drops and stays below 90%, return immediately back to the ER.  Otherwise increase your fluid intake, isolate at home for 5 days if symptoms have resolved, and inform recent close contacts of the need to test for Covid if they develop symptoms.  

## 2020-11-29 ENCOUNTER — Encounter: Payer: Self-pay | Admitting: Family Medicine

## 2020-12-10 ENCOUNTER — Other Ambulatory Visit: Payer: Self-pay

## 2020-12-10 ENCOUNTER — Emergency Department (HOSPITAL_COMMUNITY): Payer: BC Managed Care – PPO

## 2020-12-10 ENCOUNTER — Emergency Department (HOSPITAL_COMMUNITY)
Admission: EM | Admit: 2020-12-10 | Discharge: 2020-12-11 | Disposition: A | Discharge status: Home / Self Care | Payer: BC Managed Care – PPO | Attending: Emergency Medicine | Admitting: Emergency Medicine

## 2020-12-10 DIAGNOSIS — R002 Palpitations: Secondary | ICD-10-CM | POA: Insufficient documentation

## 2020-12-10 DIAGNOSIS — J45909 Unspecified asthma, uncomplicated: Secondary | ICD-10-CM | POA: Insufficient documentation

## 2020-12-10 DIAGNOSIS — R0602 Shortness of breath: Secondary | ICD-10-CM | POA: Diagnosis not present

## 2020-12-10 DIAGNOSIS — Z87891 Personal history of nicotine dependence: Secondary | ICD-10-CM | POA: Insufficient documentation

## 2020-12-10 LAB — BASIC METABOLIC PANEL
Anion gap: 8 (ref 5–15)
BUN: 12 mg/dL (ref 6–20)
CO2: 23 mmol/L (ref 22–32)
Calcium: 9.3 mg/dL (ref 8.9–10.3)
Chloride: 106 mmol/L (ref 98–111)
Creatinine, Ser: 0.92 mg/dL (ref 0.44–1.00)
GFR, Estimated: >60 mL/min (ref >60)
Glucose, Bld: 90 mg/dL (ref 70–99)
Potassium: 3.8 mmol/L (ref 3.5–5.1)
Sodium: 137 mmol/L (ref 135–145)

## 2020-12-10 LAB — CBC WITH DIFFERENTIAL/PLATELET
Abs Immature Granulocytes: 0.02 10*3/uL (ref 0.00–0.07)
Basophils Absolute: 0 10*3/uL (ref 0.0–0.1)
Basophils Relative: 0 %
Eosinophils Absolute: 0.1 10*3/uL (ref 0.0–0.5)
Eosinophils Relative: 2 %
HCT: 31.5 % — ABNORMAL LOW (ref 36.0–46.0)
Hemoglobin: 10.1 g/dL — ABNORMAL LOW (ref 12.0–15.0)
Immature Granulocytes: 0 %
Lymphocytes Relative: 29 %
Lymphs Abs: 2.1 10*3/uL (ref 0.7–4.0)
MCH: 26 pg (ref 26.0–34.0)
MCHC: 32.1 g/dL (ref 30.0–36.0)
MCV: 81.2 fL (ref 80.0–100.0)
Monocytes Absolute: 0.5 10*3/uL (ref 0.1–1.0)
Monocytes Relative: 7 %
Neutro Abs: 4.4 10*3/uL (ref 1.7–7.7)
Neutrophils Relative %: 62 %
Platelets: 294 10*3/uL (ref 150–400)
RBC: 3.88 MIL/uL (ref 3.87–5.11)
RDW: 17.1 % — ABNORMAL HIGH (ref 11.5–15.5)
WBC: 7 10*3/uL (ref 4.0–10.5)
nRBC: 0 % (ref 0.0–0.2)

## 2020-12-10 LAB — I-STAT BETA HCG BLOOD, ED (MC, WL, AP ONLY): I-stat hCG, quantitative: <5 m[IU]/mL (ref <5)

## 2020-12-10 LAB — TROPONIN I (HIGH SENSITIVITY)
Troponin I (High Sensitivity): 5 ng/L (ref <18)
Troponin I (High Sensitivity): 9 ng/L (ref <18)

## 2020-12-10 NOTE — ED Provider Notes (Signed)
Emergency Medicine Provider Triage Evaluation Note  Shelby Obrien , a 30 y.o. female  was evaluated in triage.  Pt complains of palpitations, chest burning and sob.  Review of Systems  Positive: palpitations, chest burning and sob Negative: fever  Physical Exam  BP (!) 160/102 (BP Location: Right Arm)   Pulse 88   Temp 99.1 F (37.3 C) (Oral)   Resp (!) 22   SpO2 100%  Gen:   Awake, no distress   Resp:  Normal effort  MSK:   Moves extremities without difficulty  Other:  Hyperventilating, heart rrr, lungs ctab  Medical Decision Making  Medically screening exam initiated at 6:34 PM.  Appropriate orders placed.  Shelby Obrien was informed that the remainder of the evaluation will be completed by another provider, this initial triage assessment does not replace that evaluation, and the importance of remaining in the ED until their evaluation is complete.     Karrie Meres, PA-C 12/10/20 1835    Melene Plan, DO 12/10/20 1947

## 2020-12-10 NOTE — ED Triage Notes (Signed)
Pt from work for eval of palpitations x 1 week, worsening today. Episodes are intermittent and include L arm numbness and BLE tingling.

## 2020-12-10 NOTE — ED Notes (Signed)
PT stepping outside to charge phone

## 2020-12-11 LAB — D-DIMER, QUANTITATIVE: D-Dimer, Quant: 0.36 ug/mL-FEU (ref 0.00–0.50)

## 2020-12-11 NOTE — ED Provider Notes (Signed)
[facility] EMERGENCY DEPARTMENT Provider Note   CSN: [id number] Arrival date & time: 12/10/20  1806     History Chief Complaint  Patient presents with   Palpitations    Evoleth Nordmeyer is a 30 y.o. female.  HPI     This is a 30 year old female with a history of asthma and obesity who presents with palpitations.  Patient reports 1 week history of intermittent palpitations.  Denies any changes in diet, increased caffeine use, alcohol or drug use.  Has never had palpitations in the past.  She had associated burning chest discomfort that radiated upwards as well as numbness and tingling in the bilateral hands and feet earlier today.  This concerned her.  Denies any recent fevers.  No noted shortness of breath.  She states she had 1 episode while in triage.  No recent weight gain or weight loss.  No known thyroid issues.  Denies any history of lower extremity swelling or blood clots but does report family history of blood clots.  Past Medical History:  Diagnosis Date   Asthma    Migraines    Obesity    BMI>40    Patient Active Problem List   Diagnosis Date Noted   Eczema 06/01/2019   Postpartum care following vaginal delivery 10/03/2017   Abdominal pain in pregnancy 09/23/2017   Anxiety and depression 09/04/2017   Nausea and vomiting in pregnancy 03/05/2017   Supervision of high risk pregnancy, antepartum, first trimester 02/26/2017   Maternal obesity affecting pregnancy, antepartum 02/26/2017   BMI 40.0-44.9, adult (HCC) 02/26/2017   Migraines 10/07/2016   Asthma 10/07/2016    Past Surgical History:  Procedure Laterality Date   WISDOM TOOTH EXTRACTION       OB History     Gravida  3   Para  2   Term  2   Preterm      AB  1   Living  2      SAB  1   IAB      Ectopic      Multiple  0   Live Births  2           Family History  Problem Relation Age of Onset   Cervical cancer Mother    Diabetes Maternal Grandmother     Heart disease Maternal Grandmother    Diabetes Paternal Grandmother    Heart disease Paternal Grandmother    Diabetes Paternal Uncle    Diabetes Sister     Social History   Tobacco Use   Smoking status: Former    Packs/day: 0.00    Types: Cigarettes   Smokeless tobacco: Never  Vaping Use   Vaping Use: Never used  Substance Use Topics   Alcohol use: No   Drug use: No    Home Medications Prior to Admission medications   Medication Sig Start Date End Date Taking? Authorizing Provider  albuterol (VENTOLIN HFA) 108 (90 Base) MCG/ACT inhaler Inhale 2 puffs into the lungs every 6 (six) hours as needed for wheezing or shortness of breath. 09/14/20   Jannifer Rodney A, FNP  cyclobenzaprine (FLEXERIL) 10 MG tablet Take 1 tablet (10 mg total) by mouth 3 (three) times daily as needed for muscle spasms. 06/09/19   Nelwyn Salisbury, MD  fluticasone (FLONASE) 50 MCG/ACT nasal spray Place 2 sprays into both nostrils daily. 12/06/18   Joy, Shawn C, PA-C  methylPREDNISolone (MEDROL DOSEPAK) 4 MG TBPK tablet As directed Patient not taking: Reported on 09/21/2019  06/09/19   Nelwyn Salisbury, MD  metroNIDAZOLE (FLAGYL) 500 MG tablet Take 1 tablet (500 mg total) by mouth 3 (three) times daily. 01/21/19   Nelwyn Salisbury, MD  nortriptyline (PAMELOR) 75 MG capsule Take 1 capsule (75 mg total) by mouth at bedtime. 05/24/19   Drema Dallas, DO  promethazine (PHENERGAN) 25 MG tablet Take 1 tablet (25 mg total) by mouth every 8 (eight) hours as needed for nausea or vomiting. 03/01/20   Olive Bass, FNP  Rimegepant Sulfate (NURTEC) 75 MG TBDP Take 1 tablet by mouth daily as needed (Maximum 1 tablet in 24 hours). 12/20/18   Drema Dallas, DO  SUMAtriptan (TOSYMRA) 10 MG/ACT SOLN Place 1 spray into the nose every hour as needed (Maximum 3 sprays in 24 hours.). 05/24/19   Drema Dallas, DO  triamcinolone cream (KENALOG) 0.1 % Apply 1 application topically 2 (two) times daily. 06/01/19   Nelwyn Salisbury, MD     Allergies    Shellfish allergy, Banana, Blueberry flavor, Other, and Raspberry  Review of Systems   Review of Systems  Constitutional:  Negative for fever.  Respiratory:  Negative for cough and shortness of breath.   Cardiovascular:  Positive for chest pain and palpitations. Negative for leg swelling.  Gastrointestinal:  Negative for abdominal pain, nausea and vomiting.  Neurological:  Positive for numbness.  All other systems reviewed and are negative.  Physical Exam Updated Vital Signs BP (!) 141/86 (BP Location: Right Arm)   Pulse 72   Temp 99.1 F (37.3 C) (Oral)   Resp 15   SpO2 100%   Physical Exam Vitals and nursing note reviewed.  Constitutional:      Appearance: She is well-developed. She is obese. She is not ill-appearing.  HENT:     Head: Normocephalic and atraumatic.     Nose: Nose normal.     Mouth/Throat:     Mouth: Mucous membranes are moist.  Eyes:     Pupils: Pupils are equal, round, and reactive to light.  Cardiovascular:     Rate and Rhythm: Normal rate and regular rhythm.     Heart sounds: Normal heart sounds.  Pulmonary:     Effort: Pulmonary effort is normal. No respiratory distress.     Breath sounds: No wheezing.  Abdominal:     Palpations: Abdomen is soft.     Tenderness: There is no abdominal tenderness.  Musculoskeletal:        General: No tenderness.     Cervical back: Neck supple.     Right lower leg: No edema.     Left lower leg: No edema.  Skin:    General: Skin is warm and dry.  Neurological:     Mental Status: She is alert and oriented to person, place, and time.  Psychiatric:        Mood and Affect: Mood normal.    ED Results / Procedures / Treatments   Labs (all labs ordered are listed, but only abnormal results are displayed) Labs Reviewed  CBC WITH DIFFERENTIAL/PLATELET - Abnormal; Notable for the following components:      Result Value   Hemoglobin 10.1 (*)    HCT 31.5 (*)    RDW 17.1 (*)    All other  components within normal limits  BASIC METABOLIC PANEL  D-DIMER, QUANTITATIVE  I-STAT BETA HCG BLOOD, ED ([facility], [facility], [facility] ONLY)  TROPONIN I (HIGH SENSITIVITY)  TROPONIN I (HIGH SENSITIVITY)    EKG EKG Interpretation  Date/Time:  Monday December 10 2020 18:10:49 EDT Ventricular Rate:  97 PR Interval:  144 QRS Duration: 84 QT Interval:  348 QTC Calculation: 441 R Axis:   58 Text Interpretation: Normal sinus rhythm Normal ECG Confirmed by Ross Marcus ([id number]) on 12/11/2020 12:14:47 AM  Radiology DG Chest 2 View  Result Date: 12/10/2020 CLINICAL DATA:  Cardiac palpitations with shortness of breath EXAM: CHEST - 2 VIEW COMPARISON:  02/29/2016 FINDINGS: The heart size and mediastinal contours are within normal limits. Both lungs are clear. The visualized skeletal structures are unremarkable. IMPRESSION: No active cardiopulmonary disease. Electronically Signed   By: Alcide Clever M.D.   On: 12/10/2020 19:19    Procedures Procedures   Medications Ordered in ED Medications - No data to display  ED Course  I have reviewed the triage vital signs and the nursing notes.  Pertinent labs & imaging results that were available during my care of the patient were reviewed by me and considered in my medical decision making (see chart for details).    MDM Rules/Calculators/A&P                           Patient presents with cough palpitations.  She is overall nontoxic and vital signs are reassuring.  EKG is completely normal without evidence of ischemia or arrhythmia.  Troponin x2 negative.  Highly doubt ACS.  She is low risk for PE but does have a history.  We will send a screening D-dimer to her stratify.  This was negative.  Chest x-ray shows no evidence of pneumothorax or pneumonia.  Basic lab work is reassuring without significant metabolic derangement.  No history of thyroid disease and other symptoms suggestive of hyperthyroid. Overall, medical screening labs and work-up is reassuring.  Doubt  acute emergent process.  Recommend avoiding caffeine and any stimulant medications.  Recommend follow-up with cardiology for Holter monitoring.  After history, exam, and medical workup I feel the patient has been appropriately medically screened and is safe for discharge home. Pertinent diagnoses were discussed with the patient. Patient was given return precautions.  Final Clinical Impression(s) / ED Diagnoses Final diagnoses:  Palpitations    Rx / DC Orders ED Discharge Orders     None        Kineta Fudala, Mayer Masker, MD 12/11/20 0139

## 2020-12-11 NOTE — Discharge Instructions (Addendum)
You were seen today for palpitations.  Your work-up was reassuring including heart testing.  Your screening test for blood clots was negative.  Follow-up with cardiology for possible Holter monitoring.  Make sure to avoid excessive caffeine or any stimulants.

## 2020-12-27 ENCOUNTER — Inpatient Hospital Stay (HOSPITAL_COMMUNITY): Payer: BC Managed Care – PPO

## 2020-12-27 ENCOUNTER — Ambulatory Visit (HOSPITAL_COMMUNITY)
Admission: EM | Admit: 2020-12-27 | Discharge: 2020-12-27 | Disposition: A | Discharge status: Home / Self Care | Payer: BC Managed Care – PPO | Attending: Medical Oncology | Admitting: Medical Oncology

## 2020-12-27 ENCOUNTER — Encounter (HOSPITAL_COMMUNITY): Payer: Self-pay

## 2020-12-27 ENCOUNTER — Inpatient Hospital Stay (HOSPITAL_COMMUNITY)
Admission: AD | Admit: 2020-12-27 | Discharge: 2020-12-27 | Disposition: A | Discharge status: Home / Self Care | Payer: BC Managed Care – PPO | Attending: Obstetrics & Gynecology | Admitting: Obstetrics & Gynecology

## 2020-12-27 ENCOUNTER — Telehealth: Payer: BC Managed Care – PPO | Admitting: Nurse Practitioner

## 2020-12-27 ENCOUNTER — Encounter (HOSPITAL_COMMUNITY): Payer: Self-pay | Admitting: Obstetrics & Gynecology

## 2020-12-27 ENCOUNTER — Other Ambulatory Visit: Payer: Self-pay

## 2020-12-27 DIAGNOSIS — O209 Hemorrhage in early pregnancy, unspecified: Secondary | ICD-10-CM | POA: Insufficient documentation

## 2020-12-27 DIAGNOSIS — O3680X0 Pregnancy with inconclusive fetal viability, not applicable or unspecified: Secondary | ICD-10-CM | POA: Diagnosis not present

## 2020-12-27 DIAGNOSIS — Z3201 Encounter for pregnancy test, result positive: Secondary | ICD-10-CM

## 2020-12-27 DIAGNOSIS — N760 Acute vaginitis: Secondary | ICD-10-CM

## 2020-12-27 DIAGNOSIS — Z3A01 Less than 8 weeks gestation of pregnancy: Secondary | ICD-10-CM | POA: Diagnosis not present

## 2020-12-27 DIAGNOSIS — R1031 Right lower quadrant pain: Secondary | ICD-10-CM | POA: Diagnosis not present

## 2020-12-27 DIAGNOSIS — B9689 Other specified bacterial agents as the cause of diseases classified elsewhere: Secondary | ICD-10-CM

## 2020-12-27 DIAGNOSIS — Z79899 Other long term (current) drug therapy: Secondary | ICD-10-CM | POA: Diagnosis not present

## 2020-12-27 DIAGNOSIS — R109 Unspecified abdominal pain: Secondary | ICD-10-CM | POA: Diagnosis not present

## 2020-12-27 DIAGNOSIS — A5901 Trichomonal vulvovaginitis: Secondary | ICD-10-CM

## 2020-12-27 DIAGNOSIS — O26891 Other specified pregnancy related conditions, first trimester: Secondary | ICD-10-CM | POA: Diagnosis not present

## 2020-12-27 DIAGNOSIS — N898 Other specified noninflammatory disorders of vagina: Secondary | ICD-10-CM

## 2020-12-27 DIAGNOSIS — O4690 Antepartum hemorrhage, unspecified, unspecified trimester: Secondary | ICD-10-CM

## 2020-12-27 DIAGNOSIS — Z3689 Encounter for other specified antenatal screening: Secondary | ICD-10-CM | POA: Diagnosis not present

## 2020-12-27 DIAGNOSIS — Z87891 Personal history of nicotine dependence: Secondary | ICD-10-CM | POA: Insufficient documentation

## 2020-12-27 DIAGNOSIS — O98311 Other infections with a predominantly sexual mode of transmission complicating pregnancy, first trimester: Secondary | ICD-10-CM | POA: Insufficient documentation

## 2020-12-27 LAB — WET PREP, GENITAL
Sperm: NONE SEEN
Yeast Wet Prep HPF POC: NONE SEEN

## 2020-12-27 LAB — POCT URINALYSIS DIPSTICK, ED / UC
Bilirubin Urine: NEGATIVE
Glucose, UA: NEGATIVE mg/dL
Leukocytes,Ua: NEGATIVE
Nitrite: NEGATIVE
Protein, ur: NEGATIVE mg/dL
Specific Gravity, Urine: 1.025 (ref 1.005–1.030)
Urobilinogen, UA: 1 mg/dL (ref 0.0–1.0)
pH: 6.5 (ref 5.0–8.0)

## 2020-12-27 LAB — COMPREHENSIVE METABOLIC PANEL
ALT: 13 U/L (ref 0–44)
AST: 18 U/L (ref 15–41)
Albumin: 3.4 g/dL — ABNORMAL LOW (ref 3.5–5.0)
Alkaline Phosphatase: 75 U/L (ref 38–126)
Anion gap: 6 (ref 5–15)
BUN: 12 mg/dL (ref 6–20)
CO2: 25 mmol/L (ref 22–32)
Calcium: 8.7 mg/dL — ABNORMAL LOW (ref 8.9–10.3)
Chloride: 104 mmol/L (ref 98–111)
Creatinine, Ser: 0.87 mg/dL (ref 0.44–1.00)
GFR, Estimated: >60 mL/min (ref >60)
Glucose, Bld: 87 mg/dL (ref 70–99)
Potassium: 3.7 mmol/L (ref 3.5–5.1)
Sodium: 135 mmol/L (ref 135–145)
Total Bilirubin: 0.4 mg/dL (ref 0.3–1.2)
Total Protein: 6.7 g/dL (ref 6.5–8.1)

## 2020-12-27 LAB — HCG, QUANTITATIVE, PREGNANCY: hCG, Beta Chain, Quant, S: 5309 m[IU]/mL — ABNORMAL HIGH (ref <5)

## 2020-12-27 LAB — URINALYSIS, ROUTINE W REFLEX MICROSCOPIC
Bilirubin Urine: NEGATIVE
Glucose, UA: NEGATIVE mg/dL
Hgb urine dipstick: NEGATIVE
Ketones, ur: NEGATIVE mg/dL
Leukocytes,Ua: NEGATIVE
Nitrite: NEGATIVE
Protein, ur: NEGATIVE mg/dL
Specific Gravity, Urine: 1.027 (ref 1.005–1.030)
pH: 6 (ref 5.0–8.0)

## 2020-12-27 LAB — CBC
HCT: 31.8 % — ABNORMAL LOW (ref 36.0–46.0)
Hemoglobin: 10.1 g/dL — ABNORMAL LOW (ref 12.0–15.0)
MCH: 25.9 pg — ABNORMAL LOW (ref 26.0–34.0)
MCHC: 31.8 g/dL (ref 30.0–36.0)
MCV: 81.5 fL (ref 80.0–100.0)
Platelets: 289 10*3/uL (ref 150–400)
RBC: 3.9 MIL/uL (ref 3.87–5.11)
RDW: 17.6 % — ABNORMAL HIGH (ref 11.5–15.5)
WBC: 8.2 10*3/uL (ref 4.0–10.5)
nRBC: 0 % (ref 0.0–0.2)

## 2020-12-27 LAB — TYPE AND SCREEN
ABO/RH(D): A POS
Antibody Screen: NEGATIVE

## 2020-12-27 LAB — POC URINE PREG, ED: Preg Test, Ur: POSITIVE — AB

## 2020-12-27 MED ORDER — METRONIDAZOLE 500 MG PO TABS: 500.0000 mg | ORAL_TABLET | Freq: Two times a day (BID) | ORAL | 0 refills | Status: AC

## 2020-12-27 NOTE — MAU Note (Signed)
Pt instructed in obtaining vag swabs which she did without difficulty. 

## 2020-12-27 NOTE — MAU Note (Signed)
Having stabbing pain RLQ since 1600. Went to [employer] [facility] and had some red /pink spotting

## 2020-12-27 NOTE — MAU Provider Note (Addendum)
History     [id number]  Arrival date and time: 12/27/20 1858    Chief Complaint  Patient presents with   Abdominal Pain   Vaginal Bleeding     HPI Shelby Obrien is a 30 y.o. at [redacted]w[redacted]d by sure LMP, who presents for abdominal pain.   Patient seen in ED for palpitations on 12/11/20 At that time I stat hcg was negative Today presented to MAU with RLQ pain and slightly vaginal bleeding Reports it is like when her period is about to start Pain made her double over earlier today, currently feels more like menstrual cramps when her cycle is about to start Denies fever, nausea, vomiting No vaginal discharge Has had a regular period prior to conceiving   --/--/A POS (09/01 2020)  OB History     Gravida  4   Para  2   Term  2   Preterm      AB  1   Living  2      SAB  1   IAB      Ectopic      Multiple  0   Live Births  2           Past Medical History:  Diagnosis Date   Asthma    Migraines    Obesity    BMI>40    Past Surgical History:  Procedure Laterality Date   WISDOM TOOTH EXTRACTION      Family History  Problem Relation Age of Onset   Cervical cancer Mother    Diabetes Maternal Grandmother    Heart disease Maternal Grandmother    Diabetes Paternal Grandmother    Heart disease Paternal Grandmother    Diabetes Paternal Uncle    Diabetes Sister     Social History   Socioeconomic History   Marital status: Single    Spouse name: Not on file   Number of children: 2   Years of education: 16   Highest education level: Bachelor's degree (e.g., BA, AB, BS)  Occupational History   Occupation: Multimedia programmer: [employer]  Tobacco Use   Smoking status: Former    Packs/day: 0.00    Types: Cigarettes   Smokeless tobacco: Never  Vaping Use   Vaping Use: Never used  Substance and Sexual Activity   Alcohol use: No   Drug use: No   Sexual activity: Yes    Birth control/protection: None  Other Topics Concern   Not on file   Social History Narrative   Lives with boyfriend and 2 daughters in an apartment on the 2nd floor.  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  Education: college. She is right-handed. Caffeine - one glass every few weeks. She is active with her children and work.   Social Determinants of Health   Financial Resource Strain: Not on file  Food Insecurity: Not on file  Transportation Needs: Not on file  Physical Activity: Not on file  Stress: Not on file  Social Connections: Not on file  Intimate Partner Violence: Not on file    Allergies  Allergen Reactions   Shellfish Allergy Anaphylaxis   Banana    Blueberry Flavor    Other     BLUEBERRIES, BANANAS, TREE NUTS, RASPBERRIES, GRAPES, POLLENS/SEASONAL.   Raspberry     No current facility-administered medications on file prior to encounter.   Current Outpatient Medications on File Prior to Encounter  Medication Sig Dispense Refill   albuterol (VENTOLIN HFA) 108 (90 Base)  MCG/ACT inhaler Inhale 2 puffs into the lungs every 6 (six) hours as needed for wheezing or shortness of breath. 8 g 0   cyclobenzaprine (FLEXERIL) 10 MG tablet Take 1 tablet (10 mg total) by mouth 3 (three) times daily as needed for muscle spasms. 60 tablet 1   fluticasone (FLONASE) 50 MCG/ACT nasal spray Place 2 sprays into both nostrils daily. 16 g 2   methylPREDNISolone (MEDROL DOSEPAK) 4 MG TBPK tablet As directed (Patient not taking: Reported on 09/21/2019) 21 tablet 0   metroNIDAZOLE (FLAGYL) 500 MG tablet Take 1 tablet (500 mg total) by mouth 3 (three) times daily. 21 tablet 0   nortriptyline (PAMELOR) 75 MG capsule Take 1 capsule (75 mg total) by mouth at bedtime. 45 capsule 0   promethazine (PHENERGAN) 25 MG tablet Take 1 tablet (25 mg total) by mouth every 8 (eight) hours as needed for nausea or vomiting. 15 tablet 0   Rimegepant Sulfate (NURTEC) 75 MG TBDP Take 1 tablet by mouth daily as needed (Maximum 1 tablet in 24 hours). 8 tablet 5   SUMAtriptan (TOSYMRA) 10  MG/ACT SOLN Place 1 spray into the nose every hour as needed (Maximum 3 sprays in 24 hours.). 6 each 11   triamcinolone cream (KENALOG) 0.1 % Apply 1 application topically 2 (two) times daily. 45 g 2     Review of Systems  Constitutional:  Negative for fever.  Gastrointestinal:  Positive for abdominal pain. Negative for nausea and vomiting.  Pertinent positives and negative per HPI, all others reviewed and negative  Physical Exam   BP (!) 145/86 (BP Location: Right Arm)   Pulse 79   Temp 98.3 F (36.8 C)   Resp 18   Ht 5\' 4"  (1.626 m)   Wt 122.9 kg   LMP 11/24/2020   BMI 46.52 kg/m   Patient Vitals for the past 24 hrs:  BP Temp Pulse Resp Height Weight  12/27/20 1919 (!) 145/86 98.3 F (36.8 C) 79 18 5\' 4"  (1.626 m) 122.9 kg    Physical Exam Vitals reviewed.  Constitutional:      General: She is not in acute distress.    Appearance: She is well-developed. She is not diaphoretic.  Eyes:     General: No scleral icterus. Pulmonary:     Effort: Pulmonary effort is normal. No respiratory distress.  Abdominal:     General: There is no distension.     Palpations: Abdomen is soft.     Tenderness: There is abdominal tenderness in the right lower quadrant. There is no guarding or rebound.  Skin:    General: Skin is warm and dry.  Neurological:     Mental Status: She is alert.     Coordination: Coordination normal.    Labs Results for orders placed or performed during the hospital encounter of 12/27/20 (from the past 24 hour(s))  Urinalysis, Routine w reflex microscopic Urine, Clean Catch     Status: Abnormal   Collection Time: 12/27/20  7:25 PM  Result Value Ref Range   Color, Urine YELLOW YELLOW   APPearance HAZY (A) CLEAR   Specific Gravity, Urine 1.027 1.005 - 1.030   pH 6.0 5.0 - 8.0   Glucose, UA NEGATIVE NEGATIVE mg/dL   Hgb urine dipstick NEGATIVE NEGATIVE   Bilirubin Urine NEGATIVE NEGATIVE   Ketones, ur NEGATIVE NEGATIVE mg/dL   Protein, ur NEGATIVE  NEGATIVE mg/dL   Nitrite NEGATIVE NEGATIVE   Leukocytes,Ua NEGATIVE NEGATIVE  CBC     Status: Abnormal  Collection Time: 12/27/20  8:16 PM  Result Value Ref Range   WBC 8.2 4.0 - 10.5 K/uL   RBC 3.90 3.87 - 5.11 MIL/uL   Hemoglobin 10.1 (L) 12.0 - 15.0 g/dL   HCT 21.6 (L) 24.4 - 69.5 %   MCV 81.5 80.0 - 100.0 fL   MCH 25.9 (L) 26.0 - 34.0 pg   MCHC 31.8 30.0 - 36.0 g/dL   RDW 07.2 (H) 25.7 - 50.5 %   Platelets 289 150 - 400 K/uL   nRBC 0.0 0.0 - 0.2 %  Comprehensive metabolic panel     Status: Abnormal   Collection Time: 12/27/20  8:16 PM  Result Value Ref Range   Sodium 135 135 - 145 mmol/L   Potassium 3.7 3.5 - 5.1 mmol/L   Chloride 104 98 - 111 mmol/L   CO2 25 22 - 32 mmol/L   Glucose, Bld 87 70 - 99 mg/dL   BUN 12 6 - 20 mg/dL   Creatinine, Ser 1.83 0.44 - 1.00 mg/dL   Calcium 8.7 (L) 8.9 - 10.3 mg/dL   Total Protein 6.7 6.5 - 8.1 g/dL   Albumin 3.4 (L) 3.5 - 5.0 g/dL   AST 18 15 - 41 U/L   ALT 13 0 - 44 U/L   Alkaline Phosphatase 75 38 - 126 U/L   Total Bilirubin 0.4 0.3 - 1.2 mg/dL   GFR, Estimated >35 >82 mL/min   Anion gap 6 5 - 15  hCG, quantitative, pregnancy     Status: Abnormal   Collection Time: 12/27/20  8:16 PM  Result Value Ref Range   hCG, Beta Chain, Quant, S 5,309 (H) <5 mIU/mL  Type and screen [facility]     Status: None   Collection Time: 12/27/20  8:20 PM  Result Value Ref Range   ABO/RH(D) A POS    Antibody Screen NEG    Sample Expiration      12/30/2020,2359 Performed at [facility], [address], [place], [place] [address]   Wet prep, genital     Status: Abnormal   Collection Time: 12/27/20  9:34 PM   Specimen: Vaginal  Result Value Ref Range   Yeast Wet Prep HPF POC NONE SEEN NONE SEEN   Trich, Wet Prep PRESENT (A) NONE SEEN   Clue Cells Wet Prep HPF POC PRESENT (A) NONE SEEN   WBC, Wet Prep HPF POC MANY (A) NONE SEEN   Sperm NONE SEEN      MAU Course  Procedures Lab Orders         Wet prep, genital          Urinalysis, Routine w reflex microscopic Urine, Clean Catch         CBC         Comprehensive metabolic panel         hCG, quantitative, pregnancy    Meds ordered this encounter  Medications   metroNIDAZOLE (FLAGYL) 500 MG tablet    Sig: Take 1 tablet (500 mg total) by mouth 2 (two) times daily for 7 days.    Dispense:  14 tablet    Refill:  0    Imaging Orders         US OB LESS THAN 14 WEEKS WITH OB TRANSVAGINAL     MDM Patient with sure LMP and negative hcg two weeks ago, very low suspicion for ectopic but will need to obtain labs to trend hcg. Ddx includes ectopic pregnancy, miscarriage, ovarian torsion,  PID/TOA, appendicitis, mittelshmerz. Will obtain US and re-evaluate.   Care signed out at change of shift.  Venora Maples, MD/MPH 12/27/20  8:05 PM   Addendum by Dr. Jaynie Collins: I assumed care of patient. Pain improved on my assessment, no other concerning symptoms.  Imaging: DG Chest 2 View  Result Date: 12/10/2020 CLINICAL DATA:  Cardiac palpitations with shortness of breath EXAM: CHEST - 2 VIEW COMPARISON:  02/29/2016 FINDINGS: The heart size and mediastinal contours are within normal limits. Both lungs are clear. The visualized skeletal structures are unremarkable. IMPRESSION: No active cardiopulmonary disease. Electronically Signed   By: Alcide Clever M.D.   On: 12/10/2020 19:19   US OB LESS THAN 14 WEEKS WITH OB TRANSVAGINAL  Result Date: 12/27/2020 CLINICAL DATA:  Pregnancy of unknown location. EXAM: OBSTETRIC <14 WK [place] AND TRANSVAGINAL OB US TECHNIQUE: Both transabdominal and transvaginal ultrasound examinations were performed for complete evaluation of the gestation as well as the maternal uterus, adnexal regions, and pelvic cul-de-sac. Transvaginal technique was performed to assess early pregnancy. COMPARISON:  None. FINDINGS: Intrauterine gestational sac: Single Yolk sac:  Not Visualized. Embryo:  Not Visualized. Cardiac Activity: Not Visualized. Heart  Rate: N/A  bpm MSD: 4.7 mm   5 w   1 d Subchorionic hemorrhage:  None visualized. Maternal uterus/adnexae: A corpus luteum cyst is seen within an otherwise normal-appearing right ovary. The left ovary is visualized and is normal in appearance. A moderate amount of pelvic free fluid is seen. IMPRESSION: 1. Probable early intrauterine gestational sac, but no yolk sac, fetal pole, or cardiac activity yet visualized. Recommend follow-up quantitative B-HCG levels and follow-up US in 14 days to assess viability. This recommendation follows SRU consensus guidelines: Diagnostic Criteria for Nonviable Pregnancy Early in the First Trimester. Malva Limes Med 2013; 175:1025-85. 2. Moderate amount of pelvic free fluid. Electronically Signed   By: Aram Candela M.D.   On: 12/27/2020 22:26    Assessment and Plan   1. Vaginal bleeding in pregnancy   2. Pregnancy, location unknown   3. Trichomonal vaginitis   4. Bacterial vaginitis    Discussed ultrasound findings with patient No overt ectopic seen, minimal current pain Will need to return in 48-72 hours for repeat HCG, patient reports she will come back on 12/30/20 morning. Ectopic precautions reviewed  Informed of trichomonal and bacterial vaginitis. Recommend testing for other STIs, she wants to do this at next visit on 12/30/20, also needs to let partner(s) know so the partner(s) can get testing and treatment. Patient and sex partner(s) should abstain from unprotected sexual activity for seven days after everyone receives appropriate treatment.  Metronidazole was prescribed for patient.  Advised patient to practice safe sex at all times. Discharged to home in stable condition.   Tereso Newcomer, MD  12/27/2020 11:05 PM

## 2020-12-27 NOTE — Progress Notes (Signed)
Based on what you shared with me, I feel your condition warrants further evaluation and I recommend that you be seen in a face to face visit.   NOTE: There will be NO CHARGE for this eVisit   If you are having a true medical emergency please call 911.      For an urgent face to face visit, [place] has six urgent care centers for your convenience:     [facility] at [facility] Directions [phone] [address] [place], [place] [address]    [facility] Health Urgent Care Center [facility]) Get Driving Directions [phone] [address] [place], [place] [address]  [facility] Urgent Care Center [facility]) Get Driving Directions [phone] [address] [place],  [place]  [address]  [facility]gent [facility] at [facility] Get Driving Directions [phone], [address] [place], [place] [address]   [facility] Health Urgent Care at [facility] Get Driving Directions  [phone] [address] [place], [place] [address]   [facility] Health Urgent Care at [facility] Directions [phone] [address] [place], [place] [address]  Your MyChart E-visit questionnaire answers were reviewed by a board certified advanced clinical practitioner to complete your personal care plan based on your specific symptoms.  Thank you for using e-Visits.     Called patient to confirm she understands the need to be seen today at [facility] or ED and she is agreeable to seek in person care now.

## 2020-12-27 NOTE — ED Triage Notes (Signed)
Pt presents with abdominal cramping that started around 1630. States the cramps were mild and states it worsened this afternoon.   Pt states she found out she was pregnant on Tuesday and states she has had red vaginal discharge. States she did a e visit and was told to be seen at urgent care.   States she has sharp pains on LLQ.

## 2020-12-30 ENCOUNTER — Other Ambulatory Visit: Payer: Self-pay

## 2020-12-30 ENCOUNTER — Inpatient Hospital Stay (HOSPITAL_COMMUNITY)
Admission: AD | Admit: 2020-12-30 | Discharge: 2020-12-30 | Disposition: A | Discharge status: Home / Self Care | Payer: BC Managed Care – PPO | Attending: Obstetrics and Gynecology | Admitting: Obstetrics and Gynecology

## 2020-12-30 ENCOUNTER — Encounter (HOSPITAL_COMMUNITY): Payer: Self-pay | Admitting: Obstetrics and Gynecology

## 2020-12-30 DIAGNOSIS — O26891 Other specified pregnancy related conditions, first trimester: Secondary | ICD-10-CM | POA: Diagnosis not present

## 2020-12-30 DIAGNOSIS — E349 Endocrine disorder, unspecified: Secondary | ICD-10-CM

## 2020-12-30 DIAGNOSIS — Z3A01 Less than 8 weeks gestation of pregnancy: Secondary | ICD-10-CM | POA: Insufficient documentation

## 2020-12-30 DIAGNOSIS — O3680X0 Pregnancy with inconclusive fetal viability, not applicable or unspecified: Secondary | ICD-10-CM

## 2020-12-30 DIAGNOSIS — Z3689 Encounter for other specified antenatal screening: Secondary | ICD-10-CM | POA: Diagnosis not present

## 2020-12-30 LAB — HCG, QUANTITATIVE, PREGNANCY: hCG, Beta Chain, Quant, S: 12402 m[IU]/mL — ABNORMAL HIGH (ref <5)

## 2020-12-30 NOTE — Discharge Instructions (Signed)
Prenatal Care Providers           [facility] -- for ULTRASOUND  [address] [phone]  [facility] [place] -- to start PRENATAL CARE       [address] [phone]

## 2020-12-30 NOTE — MAU Provider Note (Signed)
Ms. Shelby Obrien  is a 30 y.o. [phone] at [redacted]w[redacted]d who presents to MAU for follow-up quant hCG after 48 hours.   The patient was seen in MAU on 12/27/2020  quant hCG of 5309  US showed IMPRESSION: 1. Probable early intrauterine gestational sac, but no yolk sac, fetal pole, or cardiac activity yet visualized. Recommend follow-up quantitative B-HCG levels and follow-up US in 14 days to assess viability. 2. Moderate amount of pelvic free fluid.  She denies pain Denies vaginal bleeding  Denies fever    OB History  Gravida Para Term Preterm AB Living  4 2 2   1 2   SAB IAB Ectopic Multiple Live Births  1     0 2    # Outcome Date GA Lbr Len/2nd Weight Sex Delivery Anes PTL Lv  4 Current           3 Term 10/03/17 [redacted]w[redacted]d / 00:28 3060 g F Vag-Spont EPI  LIV  2 Term 04/09/14 [redacted]w[redacted]d  3317 g F  EPI N LIV  1 SAB             Past Medical History:  Diagnosis Date   Asthma    Migraines    Obesity    BMI>40     BP 122/75 (BP Location: Right Arm)   Pulse 85   Temp 98.5 F (36.9 C) (Oral)   Resp 19   Ht 5\' 4"  (1.626 m)   Wt 124.1 kg   LMP 11/24/2020   BMI 46.98 kg/m   CONSTITUTIONAL: Well-developed, well-nourished female in no acute distress.  MUSCULOSKELETAL: Normal range of motion.  CARDIOVASCULAR: Regular heart rate RESPIRATORY: Normal effort NEUROLOGICAL: Alert and oriented to person, place, and time.  SKIN: Skin is warm and dry. No rash noted. Not diaphoretic. No erythema. No pallor. PSYCH: Normal mood and affect. Normal behavior. Normal judgment and thought content.  No results found for this or any previous visit (from the past 24 hour(s)).  A: Appropriate rise in quant hCG after 48 hours  P: Discharge home Reviewed U/S results from 12/27/20. Explained CLC found on RT ovary is functional and normal for pregnancy until placenta can develop. First trimester/ectopic precautions discussed Patient will return for follow-up 11/26/2020 in 1 week. Order placed and RN to schedule.  Patient will return to [facility] for results following [place].  Patient may return to MAU as needed or if her condition were to change or worsen   [facility], CNM 12/30/2020 10:55 AM

## 2020-12-30 NOTE — MAU Note (Signed)
..  Shelby Obrien is a 30 y.o. at [redacted]w[redacted]d here in MAU reporting: here for repeat beta hcg. States her vaginal bleeding has ceased. Denies any other complaints or pain. Requesting further information regarding the cyst that was found on her right ovary.  BP: 122/75 P: 85  T: 98.5 oral R: 19  O2:100%

## 2021-01-01 LAB — GC/CHLAMYDIA PROBE AMP (~~LOC~~) NOT AT ARMC
Chlamydia: NEGATIVE
Comment: NEGATIVE
Comment: NORMAL
Neisseria Gonorrhea: NEGATIVE

## 2021-01-07 ENCOUNTER — Other Ambulatory Visit: Payer: Self-pay

## 2021-01-07 ENCOUNTER — Ambulatory Visit
Admission: RE | Admit: 2021-01-07 | Discharge: 2021-01-07 | Disposition: A | Discharge status: Home / Self Care | Payer: BC Managed Care – PPO | Source: Ambulatory Visit | Attending: Obstetrics and Gynecology | Admitting: Obstetrics and Gynecology

## 2021-01-07 DIAGNOSIS — Z3A01 Less than 8 weeks gestation of pregnancy: Secondary | ICD-10-CM | POA: Diagnosis not present

## 2021-01-07 DIAGNOSIS — Z3689 Encounter for other specified antenatal screening: Secondary | ICD-10-CM | POA: Diagnosis not present

## 2021-01-07 DIAGNOSIS — O3680X0 Pregnancy with inconclusive fetal viability, not applicable or unspecified: Secondary | ICD-10-CM | POA: Diagnosis not present

## 2021-01-16 ENCOUNTER — Ambulatory Visit: Payer: BC Managed Care – PPO | Admitting: Cardiology

## 2021-01-17 ENCOUNTER — Other Ambulatory Visit (HOSPITAL_COMMUNITY)
Admission: RE | Admit: 2021-01-17 | Discharge: 2021-01-17 | Disposition: A | Discharge status: Home / Self Care | Payer: BC Managed Care – PPO | Source: Ambulatory Visit | Attending: Obstetrics | Admitting: Obstetrics

## 2021-01-17 ENCOUNTER — Other Ambulatory Visit: Payer: Self-pay

## 2021-01-17 ENCOUNTER — Ambulatory Visit (INDEPENDENT_AMBULATORY_CARE_PROVIDER_SITE_OTHER): Payer: BC Managed Care – PPO | Admitting: Obstetrics

## 2021-01-17 ENCOUNTER — Encounter: Payer: Self-pay | Admitting: Obstetrics

## 2021-01-17 VITALS — BP 120/74 | Ht 64.0 in | Wt 267.8 lb

## 2021-01-17 DIAGNOSIS — Z124 Encounter for screening for malignant neoplasm of cervix: Secondary | ICD-10-CM

## 2021-01-17 DIAGNOSIS — O4692 Antepartum hemorrhage, unspecified, second trimester: Secondary | ICD-10-CM | POA: Diagnosis not present

## 2021-01-17 DIAGNOSIS — N912 Amenorrhea, unspecified: Secondary | ICD-10-CM

## 2021-01-17 DIAGNOSIS — O99212 Obesity complicating pregnancy, second trimester: Secondary | ICD-10-CM | POA: Diagnosis not present

## 2021-01-17 DIAGNOSIS — O0990 Supervision of high risk pregnancy, unspecified, unspecified trimester: Secondary | ICD-10-CM | POA: Insufficient documentation

## 2021-01-17 DIAGNOSIS — O0992 Supervision of high risk pregnancy, unspecified, second trimester: Secondary | ICD-10-CM | POA: Diagnosis not present

## 2021-01-17 DIAGNOSIS — Z3A35 35 weeks gestation of pregnancy: Secondary | ICD-10-CM | POA: Diagnosis not present

## 2021-01-17 DIAGNOSIS — E669 Obesity, unspecified: Secondary | ICD-10-CM | POA: Diagnosis not present

## 2021-01-17 DIAGNOSIS — O0993 Supervision of high risk pregnancy, unspecified, third trimester: Secondary | ICD-10-CM | POA: Diagnosis not present

## 2021-01-17 DIAGNOSIS — Z113 Encounter for screening for infections with a predominantly sexual mode of transmission: Secondary | ICD-10-CM | POA: Insufficient documentation

## 2021-01-17 NOTE — Progress Notes (Signed)
New Obstetric Patient H&P    Chief Complaint: "Desires prenatal care"   History of Present Illness: Patient is a 30 y.o. [id number] Not Hispanic or Latino female, LMP 11/24/2020 presents with amenorrhea and positive home pregnancy test. Based on her  LMP, her EDD is Estimated Date of Delivery: 08/31/21 and her EGA is [redacted]w[redacted]d. Cycles are 7. days, regular, and occur approximately every : 29 days. Her last pap smear was 3 years ago and was no abnormalities.    She had a urine pregnancy test which was positive 3 week(s)  ago. Her last menstrual period was normal and lasted for  6 day(s). Since her LMP she claims she has experienced little appetite. She denies vaginal bleeding. Her past medical history is noncontributory. Her prior pregnancies are notable for tobacco use  Since her LMP, she admits to the use of tobacco products  no She claims she has gained   no pounds since the start of her pregnancy.  There are cats in the home in the home  no  She admits close contact with children on a regular basis  yes  She has had chicken pox in the past no She has had Tuberculosis exposures, symptoms, or previously tested positive for TB   no Current or past history of domestic violence. no  Genetic Screening/Teratology Counseling: (Includes patient, baby's father, or anyone in either family with:)   1. Patient's age >/= 70 at [facility]  no 2. Thalassemia ([place], [place], Mediterranean, or Asian background): MCV<80  no 3. Neural tube defect (meningomyelocele, spina bifida, anencephaly)  no 4. Congenital heart defect  no  5. Down syndrome  no 6. Tay-Sachs (Jewish, [place]  no 7. Canavan's Disease  no 8. Sickle cell disease or trait (African)  no  9. Hemophilia or other blood disorders  no  10. Muscular dystrophy  no  11. Cystic fibrosis  no  12. Huntington's Chorea  no  13. Mental retardation/autism  no 14. Other inherited genetic or chromosomal disorder  no 15. Maternal metabolic disorder  (DM, PKU, etc)  no 16. Patient or FOB with a child with a birth defect not listed above no  16a. Patient or FOB with a birth defect themselves no 17. Recurrent pregnancy loss, or stillbirth  no  18. Any medications since LMP other than prenatal vitamins (include vitamins, supplements, OTC meds, drugs, alcohol)  no 19. Any other genetic/environmental exposure to discuss  no  Infection History:   1. Lives with someone with TB or TB exposed  no  2. Patient or partner has history of genital herpes  no 3. Rash or viral illness since LMP  no 4. History of STI (GC, CT, HPV, syphilis, HIV)  yes- Trich, Chlamydia 5. History of recent travel :  no  Other pertinent information:  no     Review of Systems:10 point review of systems negative unless otherwise noted in HPI  Past Medical History:  Past Medical History:  Diagnosis Date   Asthma    Migraines    Obesity    BMI>40   Supervision of high risk pregnancy, antepartum, first trimester 02/26/2017   Clinic [facility] Prenatal Labs  Dating  LMP=7w [place] Blood type: A Pos  Genetic Screen 1 Screen: neg   AFP:     Quad:     NIPS: Antibody:   Anatomic [place]  complete, normal Rubella: Immune Varicella: Immune  GTT Early:  98  Third trimester: 101 RPR: NR  Rhogam Not applicable HBsAg: Negative  TDaP vaccine 08/07/17                       Flu Shot: HIV: NR  Baby Food  Breast                           Past Surgical History:  Past Surgical History:  Procedure Laterality Date   WISDOM TOOTH EXTRACTION      Gynecologic History: Patient's last menstrual period was 11/24/2020.  Obstetric History: [id number]  Family History:  Family History  Problem Relation Age of Onset   Cervical cancer Mother    Diabetes Maternal Grandmother    Heart disease Maternal Grandmother    Diabetes Paternal Grandmother    Heart disease Paternal Grandmother    Diabetes Paternal Uncle    Diabetes Sister     Social History:  Social History   Socioeconomic  History   Marital status: Single    Spouse name: Not on file   Number of children: 2   Years of education: 16   Highest education level: Bachelor's degree (e.g., BA, AB, BS)  Occupational History   Occupation: Multimedia programmer: [employer]  Tobacco Use   Smoking status: Former    Packs/day: 0.00    Types: Cigarettes   Smokeless tobacco: Never  Vaping Use   Vaping Use: Never used  Substance and Sexual Activity   Alcohol use: No   Drug use: No   Sexual activity: Yes    Birth control/protection: None  Other Topics Concern   Not on file  Social History Narrative   Lives with boyfriend and 2 daughters in an apartment on the 2nd floor.  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  Education: college. She is right-handed. Caffeine - one glass every few weeks. She is active with her children and work.   Social Determinants of Health   Financial Resource Strain: Not on file  Food Insecurity: Not on file  Transportation Needs: Not on file  Physical Activity: Not on file  Stress: Not on file  Social Connections: Not on file  Intimate Partner Violence: Not on file    Allergies:  Allergies  Allergen Reactions   Shellfish Allergy Anaphylaxis   Banana    Blueberry Flavor    Other     BLUEBERRIES, BANANAS, TREE NUTS, RASPBERRIES, GRAPES, POLLENS/SEASONAL.   Raspberry     Medications: Prior to Admission medications   Medication Sig Start Date End Date Taking? Authorizing Provider  albuterol (VENTOLIN HFA) 108 (90 Base) MCG/ACT inhaler Inhale 2 puffs into the lungs every 6 (six) hours as needed for wheezing or shortness of breath. 09/14/20  Yes Hawks, Christy A, FNP  cyclobenzaprine (FLEXERIL) 10 MG tablet Take 1 tablet (10 mg total) by mouth 3 (three) times daily as needed for muscle spasms. 06/09/19   Nelwyn Salisbury, MD  fluticasone (FLONASE) 50 MCG/ACT nasal spray Place 2 sprays into both nostrils daily. 12/06/18   Joy, Shawn C, PA-C  promethazine (PHENERGAN) 25 MG tablet Take 1 tablet (25  mg total) by mouth every 8 (eight) hours as needed for nausea or vomiting. Patient not taking: Reported on 01/17/2021 03/01/20   Olive Bass, FNP    Physical Exam Vitals: Blood pressure 120/74, height 5\' 4"  (1.626 m), weight 267 lb 12.8 oz (121.5 kg), last menstrual period 11/24/2020, unknown if currently breastfeeding.  General: NAD HEENT: normocephalic, anicteric Thyroid: no enlargement, no palpable nodules Pulmonary: No increased work of breathing, CTAB Cardiovascular: RRR, distal pulses 2+ Abdomen: NABS, soft, non-tender, non-distended.  Umbilicus without lesions.  No hepatomegaly, splenomegaly or masses palpable. No evidence of hernia  Genitourinary:  External: Normal external female genitalia.  Normal urethral meatus, normal  Bartholin's and Skene's glands.    Vagina: Normal vaginal mucosa, no evidence of prolapse.    Cervix: Grossly normal in appearance, no bleeding  Uterus: anteverted Non-enlarged, mobile, normal contour.  No CMT  Adnexa: ovaries non-enlarged, no adnexal masses  Rectal: deferred Extremities: no edema, erythema, or tenderness Neurologic: Grossly intact Psychiatric: mood appropriate, affect full   Assessment: 30 y.o. [id number] at [redacted]w[redacted]d presenting to initiate prenatal care  Plan: 1) Avoid alcoholic beverages. 2) Patient encouraged not to smoke.  3) Discontinue the use of all non-medicinal drugs and chemicals.  4) Take prenatal vitamins daily.  5) Nutrition, food safety (fish, cheese advisories, and high nitrite foods) and exercise discussed. 6) Hospital and practice style discussed with cross coverage system.  7) Genetic Screening, such as with 1st Trimester Screening, cell free fetal DNA, AFP testing, and Ultrasound, as well as with amniocentesis and CVS as appropriate, is discussed with patient. At the conclusion of today's visit patient declined genetic testing 8) Patient is asked about travel to areas at risk for the [place] virus, and counseled to  avoid travel and exposure to mosquitoes or sexual partners who may have themselves been exposed to the virus. Testing is discussed, and will be ordered as appropriate.  Pap smear performed with STI screening and her prenatal labs drawn today. RTC in 3 weeks for dating scan with MD. Will need to discuss anesthesia consult and start her on ASA at [redacted] weeks gestation. Needs anatomy scan with MFM due to high BMI.  Mirna Mires, CNM  01/17/2021 4:10 PM

## 2021-01-18 LAB — RPR+RH+ABO+RUB AB+AB SCR+CB...
Antibody Screen: NEGATIVE
HIV Screen 4th Generation wRfx: NONREACTIVE
Hematocrit: 32.2 % — ABNORMAL LOW (ref 34.0–46.6)
Hemoglobin: 10.4 g/dL — ABNORMAL LOW (ref 11.1–15.9)
Hepatitis B Surface Ag: NEGATIVE
MCH: 25.9 pg — ABNORMAL LOW (ref 26.6–33.0)
MCHC: 32.3 g/dL (ref 31.5–35.7)
MCV: 80 fL (ref 79–97)
Platelets: 230 10*3/uL (ref 150–450)
RBC: 4.01 x10E6/uL (ref 3.77–5.28)
RDW: 17 % — ABNORMAL HIGH (ref 11.7–15.4)
RPR Ser Ql: NONREACTIVE
Rh Factor: POSITIVE
Rubella Antibodies, IGG: 4.28 index (ref >0.99)
Varicella zoster IgG: 806 index (ref >165)
WBC: 7.2 10*3/uL (ref 3.4–10.8)

## 2021-01-18 LAB — HEPATITIS C ANTIBODY: Hep C Virus Ab: <0.1 s/co ratio (ref 0.0–0.9)

## 2021-01-20 LAB — URINE CULTURE

## 2021-01-21 LAB — CYTOLOGY - PAP
Chlamydia: NEGATIVE
Comment: NEGATIVE
Comment: NEGATIVE
Comment: NEGATIVE
Comment: NORMAL
Diagnosis: NEGATIVE
High risk HPV: NEGATIVE
Neisseria Gonorrhea: NEGATIVE
Trichomonas: NEGATIVE

## 2021-02-07 ENCOUNTER — Ambulatory Visit (INDEPENDENT_AMBULATORY_CARE_PROVIDER_SITE_OTHER): Payer: BC Managed Care – PPO | Admitting: Obstetrics and Gynecology

## 2021-02-07 ENCOUNTER — Other Ambulatory Visit: Payer: Self-pay

## 2021-02-07 VITALS — BP 112/76 | Wt 260.0 lb

## 2021-02-07 DIAGNOSIS — Z3689 Encounter for other specified antenatal screening: Secondary | ICD-10-CM | POA: Diagnosis not present

## 2021-02-07 DIAGNOSIS — O0990 Supervision of high risk pregnancy, unspecified, unspecified trimester: Secondary | ICD-10-CM | POA: Diagnosis not present

## 2021-02-07 DIAGNOSIS — O99210 Obesity complicating pregnancy, unspecified trimester: Secondary | ICD-10-CM

## 2021-02-07 DIAGNOSIS — Z369 Encounter for antenatal screening, unspecified: Secondary | ICD-10-CM

## 2021-02-07 DIAGNOSIS — Z31438 Encounter for other genetic testing of female for procreative management: Secondary | ICD-10-CM

## 2021-02-07 DIAGNOSIS — Z3A10 10 weeks gestation of pregnancy: Secondary | ICD-10-CM | POA: Diagnosis not present

## 2021-02-07 DIAGNOSIS — Z363 Encounter for antenatal screening for malformations: Secondary | ICD-10-CM

## 2021-02-07 DIAGNOSIS — Z1379 Encounter for other screening for genetic and chromosomal anomalies: Secondary | ICD-10-CM | POA: Diagnosis not present

## 2021-02-07 LAB — POCT URINALYSIS DIPSTICK OB
Glucose, UA: NEGATIVE
POC,PROTEIN,UA: NEGATIVE

## 2021-02-07 NOTE — Progress Notes (Signed)
Routine Prenatal Care Visit  Subjective  Shelby Obrien is a 30 y.o. [id number] at [redacted]w[redacted]d being seen today for ongoing prenatal care.  She is currently monitored for the following issues for this high-risk pregnancy and has Migraines; Asthma; Maternal obesity affecting pregnancy, antepartum; BMI 40.0-44.9, adult (HCC); Nausea and vomiting in pregnancy; Anxiety and depression; Abdominal pain in pregnancy; Postpartum care following vaginal delivery; Eczema; and Supervision of high risk pregnancy, antepartum on their problem list.  ----------------------------------------------------------------------------------- Patient reports no complaints.    . Vag. Bleeding: None.  Movement: Absent. Denies leaking of fluid.  ----------------------------------------------------------------------------------- The following portions of the patient's history were reviewed and updated as appropriate: allergies, current medications, past family history, past medical history, past social history, past surgical history and problem list. Problem list updated.   Objective  Vitals: Blood pressure 112/76, weight 260 lb (117.9 kg), last menstrual period 11/24/2020, unknown if currently breastfeeding. Body mass index is 44.63 kg/m.  Pregravid weight Pregravid weight not on file Total Weight Gain Not found. Urinalysis:      Fetal Status: Fetal Heart Rate (bpm): 164   Movement: Absent     General:  Alert, oriented and cooperative. Patient is in no acute distress.  Skin: Skin is warm and dry. No rash noted.   Cardiovascular: Normal heart rate noted  Respiratory: Normal respiratory effort, no problems with respiration noted  Abdomen: Soft, gravid, appropriate for gestational age. Pain/Pressure: Absent     Pelvic:  Cervical exam deferred        Extremities: Normal range of motion.     ental Status: Normal mood and affect. Normal behavior. Normal judgment and thought content.   Dating Ultrasound Singleton  viable IUP CRL c/w [redacted]w[redacted]d 4.01cm (3.96cm, 3.98cm, and 4.09cm) giving [place] derived EDD of 08/25/2020 consistent with LMP Estimated Date of Delivery: 08/31/21  FHT 164BPM YS visualized 0.48cm ROV normal LOV normal  No uterine fibroids No free fluid  There is a viable singleton gestation.  The fetal biometry correlates with established dating. Detailed evaluation of the fetal anatomy is precluded by early gestational age.  It must be noted that a normal ultrasound particular at this early gestational age is unable to rule out fetal aneuploidy, risk of first trimester miscarriage, or anatomic birth defects.     Assessment   30 y.o. [id number] at [redacted]w[redacted]d by  08/31/2021, by Last Menstrual Period presenting for routine prenatal visit  Plan   September 2022 Problems (from 12/27/20 to present)     Problem Noted Resolved   Supervision of high risk pregnancy, antepartum 01/17/2021 by Mirna Mires, CNM No   Overview Addendum 01/21/2021  5:50 PM by Mirna Mires, CNM     Nursing Staff Provider  Office Location  [facility] Dating    Language  English Anatomy US    Flu Vaccine   Genetic Screen  NIPS:   TDaP vaccine    Hgb A1C or  GTT Early : Third trimester :   Covid    A+  Rhogam   Blood Type A+  Feeding Plan  Antibody  neg  Contraception  Rubella  immune  Circumcision  RPR   neg  Pediatrician   HBsAg   neg  Support Person  HIV  NR  Prenatal Classes  Varicella immune    GBS  (For PCN allergy, check sensitivities)   BTL Consent   Hepatitis C negative  VBAC Consent  Pap      Hgb Electro  CF      SMA                  Gestational age appropriate obstetric precautions including but not limited to vaginal bleeding, contractions, leaking of fluid and fetal movement were reviewed in detail with the patient.    - dating scan today S=D - Genetics today - 1-hr OGTT next visit - order anatomy scan - start ASA 81mg  po daily  Return in about 4 weeks (around 03/07/2021) for ROB early  1-hr.  13/01/2021, MD, Vena [place] [facility], [facility] 02/07/2021, 1:38 PM

## 2021-02-07 NOTE — Progress Notes (Signed)
ROB - dating scan, no concerns. [place] 2

## 2021-02-15 LAB — MATERNIT 21 PLUS CORE, BLOOD
Fetal Fraction: 8
Result (T21): NEGATIVE
Trisomy 13 (Patau syndrome): NEGATIVE
Trisomy 18 (Edwards syndrome): NEGATIVE
Trisomy 21 (Down syndrome): NEGATIVE

## 2021-02-20 LAB — INHERITEST CORE(CF97,SMA,FRAX)

## 2021-02-28 ENCOUNTER — Telehealth: Payer: Self-pay

## 2021-02-28 NOTE — Telephone Encounter (Signed)
mar/lm for patient (to schedule referral received by Dr Vena [place]

## 2021-03-01 ENCOUNTER — Ambulatory Visit (INDEPENDENT_AMBULATORY_CARE_PROVIDER_SITE_OTHER): Payer: BC Managed Care – PPO

## 2021-03-01 ENCOUNTER — Other Ambulatory Visit: Payer: Self-pay

## 2021-03-01 ENCOUNTER — Ambulatory Visit (INDEPENDENT_AMBULATORY_CARE_PROVIDER_SITE_OTHER): Payer: BC Managed Care – PPO | Admitting: Cardiology

## 2021-03-01 ENCOUNTER — Encounter: Payer: Self-pay | Admitting: Cardiology

## 2021-03-01 VITALS — BP 100/60 | HR 90 | Ht 64.0 in | Wt 254.8 lb

## 2021-03-01 DIAGNOSIS — R002 Palpitations: Secondary | ICD-10-CM

## 2021-03-01 HISTORY — DX: Palpitations: R00.2

## 2021-03-01 NOTE — Progress Notes (Signed)
Primary Care Provider: Laurey Morale, MD [facility] Cardiologist: Glenetta Hew, MD Electrophysiologist: None OB-Gyn: Dr. Harolyn Rutherford  Clinic Note: Chief Complaint  Patient presents with   Palpitations    More frequent episodes lasting 5 to 10 minutes over the last couple months. Symptoms began before she knew she was pregnant.    ===================================  ASSESSMENT/PLAN   Problem List Items Addressed This Visit     Rapid palpitations - Primary    Intermittent rapid heart rate spells lasting 5 to 10 minutes sounds like it could very well be just sinus tachycardia or potentially SVT/PAT.  Probably not going that fast because she is not having any real dizziness associated. Eczema also cannot be sure that this is not related to some type of pregnancy related issue.  For now we will start off with a Zio patch monitor for 14 days.  Low threshold to consider further studies such as echocardiogram.      Relevant Orders   EKG 12-Lead   LONG TERM MONITOR (3-14 DAYS)    ===================================  HPI:    Shelby Obrien is a recently pregnant 30 y.o. female [phone] @ [redacted]w[redacted]d) with mild intermittent/seasonal allergy related asthma who is being seen today for the evaluation of HEART RACING at the request of Laurey Morale, MD.  Recent Hospitalizations:  11/18/2026-[facility]: -sore throat general body aches.  Positive for COVID-19.  Recommended fluid hydration and Tylenol/Motrin. Contacted Dr. Barbie Banner office on 11/29/2020 because of heart racing-feels like panic attack.  Initially thought it was related to excess caffeine.  Started noticing it every 15 to 20 minutes. 12/10/2020-[facility]: Palpitations.  No increase use of caffeine alcohol or drugs.  Associated burning chest discomfort radiating up towards.  Noted numbness in her fingers/hands and feet.  No F/C.  Had episode in Triage. -- No Thyroid Hx. No h/o clotting disorder or localized swelling. =>   r/o MI, EKG & Tele normal as well as labs & CXR -> referred to Cardiology for "Monitor" 9/1 - E-Visit for abdominal pain/Vaginal bleeding -> Seen in [facility]) - noted to be [redacted]w[redacted]d Pregnant  by LMP (Neg IStat Hcg on 8/16) => returned on 9/4 with + hCG; plan to return for U/S in ~1-2 wks.   Leolia Beckie Busing Alipio was most reecntly seen on 10/13 by Dr. Georgianne Fick ([facility]) -> U/S viable singleton gestation - dating appears correct.    Reviewed  CV studies:    The following studies were reviewed today: (if available, images/films reviewed: From Epic Chart or Care Everywhere) none:   Interval History:   Shelby Obrien presents here today for evaluation of palpitations.  She said during her last pregnancy (her second child) she did have some palpitations but did not have it during the initial pregnancy.  She says this is been having random episodes of fluttering heartbeats that may be happening 2 or 3 times a week.  These usually happen when she sitting still and they come and go they can last up to 10 minutes but no longer than that.  Sometimes it is only about a minute or so.  She just feels uneasy when this happened she gets a little freaked out, but does not notice any real near syncope or syncope TAVR symptoms.  Maybe little short of breath a little bit of discomfort in her chest, an uneasy feeling.  She said the symptoms all began in August of this year, but have gotten worse and  more frequent over the last few months.  Otherwise berry stable from cardiac standpoint.  Her allergies and asthma have not acted up for several months.  CV Review of Symptoms (Summary) Cardiovascular ROS: positive for - irregular heartbeat, palpitations, and rapid heart rate negative for - chest pain, dyspnea on exertion, edema, orthopnea, paroxysmal nocturnal dyspnea, shortness of breath, or lightheadedness, dizziness or wooziness, syncope/near syncope or TIA/amaurosis fugax,  claudication  REVIEWED OF SYSTEMS   Review of Systems  Constitutional:  Positive for malaise/fatigue (She is little tired because she has been having issues with nausea and vomiting.). Negative for weight loss.  HENT:  Negative for nosebleeds.   Respiratory:  Negative for cough, shortness of breath and wheezing.   Cardiovascular:        Per HPI  Gastrointestinal:  Positive for nausea and vomiting. Negative for blood in stool and melena.  Genitourinary:  Negative for dysuria and hematuria.       No more vaginal bleeding.  Musculoskeletal: Negative.   Neurological:  Positive for dizziness (More associated with when she having vomiting spells.). Negative for speech change and focal weakness.  Psychiatric/Behavioral:  Negative for depression and memory loss. The patient is nervous/anxious. The patient does not have insomnia.    I have reviewed and (if needed) personally updated the patient's problem list, medications, allergies, past medical and surgical history, social and family history.   PAST MEDICAL HISTORY   Past Medical History:  Diagnosis Date   Asthma    Migraines    Obesity    BMI>40   Supervision of high risk pregnancy, antepartum, first trimester 02/26/2017   Clinic [facility] Prenatal Labs  Dating  LMP=7w [place] Blood type: A Pos  Genetic Screen 1 Screen: neg   AFP:     Quad:     NIPS: Antibody:   Anatomic [place]  complete, normal Rubella: Immune Varicella: Immune  GTT Early:  58             Third trimester: 101 RPR: NR  Rhogam Not applicable HBsAg: Negative  TDaP vaccine 08/07/17                       Flu Shot: HIV: NR  Baby Food  Breast                           PAST SURGICAL HISTORY   Past Surgical History:  Procedure Laterality Date   NO PAST SURGERIES     WISDOM TOOTH EXTRACTION      Immunization History  Administered Date(s) Administered   Influenza,inj,Quad PF,6+ Mos 04/10/2017   PFIZER(Purple Top)SARS-COV-2 Vaccination 08/04/2019, 08/29/2019   Tdap 08/07/2017     MEDICATIONS/ALLERGIES   Current Meds  Medication Sig   albuterol (VENTOLIN HFA) 108 (90 Base) MCG/ACT inhaler Inhale 2 puffs into the lungs every 6 (six) hours as needed for wheezing or shortness of breath.   promethazine (PHENERGAN) 25 MG tablet Take 1 tablet (25 mg total) by mouth every 8 (eight) hours as needed for nausea or vomiting.    Allergies  Allergen Reactions   Shellfish Allergy Anaphylaxis   Banana    Blueberry Flavor    Other     BLUEBERRIES, BANANAS, TREE NUTS, RASPBERRIES, GRAPES, POLLENS/SEASONAL.   Raspberry     SOCIAL HISTORY/FAMILY HISTORY   Reviewed in Epic:   Social History   Tobacco Use   Smoking status: Former    Packs/day: 0.00  Types: Cigarettes   Smokeless tobacco: Never  Vaping Use   Vaping Use: Never used  Substance Use Topics   Alcohol use: No   Drug use: No   Social History   Social History Narrative   Lives with boyfriend and 2 daughters in an apartment on the 2nd floor.  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  Education: college. She is right-handed. Caffeine - one glass every few weeks. She is active with her children and work.   Family History  Problem Relation Age of Onset   Cervical cancer Mother    Diabetes Maternal Grandmother    Heart disease Maternal Grandmother    Diabetes Paternal Grandmother    Heart disease Paternal Grandmother    Diabetes Paternal Uncle    Diabetes Sister     OBJCTIVE -PE, EKG, labs   Wt Readings from Last 3 Encounters:  03/03/21 255 lb 9.6 oz (115.9 kg)  03/01/21 254 lb 12.8 oz (115.6 kg)  02/07/21 260 lb (117.9 kg)    Physical Exam: BP 100/60 (BP Location: Right Arm, Patient Position: Sitting, Cuff Size: Large)   Pulse 90   Ht 5\' 4"  (1.626 m)   Wt 254 lb 12.8 oz (115.6 kg)   LMP 11/24/2020   SpO2 97%   BMI 43.74 kg/m  Physical Exam Vitals reviewed.  Constitutional:      General: She is not in acute distress.    Appearance: She is obese. She is not ill-appearing or toxic-appearing.   HENT:     Head: Normocephalic and atraumatic.  Eyes:     Extraocular Movements: Extraocular movements intact.     Pupils: Pupils are equal, round, and reactive to light.  Neck:     Vascular: No carotid bruit or JVD.  Cardiovascular:     Rate and Rhythm: Normal rate and regular rhythm. No extrasystoles are present.    Chest Wall: PMI is not displaced (Very difficult to palpate).     Pulses: Normal pulses.     Heart sounds: S1 normal and S2 normal. Heart sounds are distant. No murmur heard.   No gallop.  Musculoskeletal:     Cervical back: Normal range of motion and neck supple.  Neurological:     Mental Status: She is alert.     Adult ECG Report  Rate: 90 ;  Rhythm: normal sinus rhythm and mild nonspecific ST and T wave changes.  Otherwise normal axis, intervals durations. ;   Narrative Interpretation: Normal  Recent Labs: Reviewed No results found for: CHOL, HDL, LDLCALC, LDLDIRECT, TRIG, CHOLHDL Lab Results  Component Value Date   CREATININE 0.87 12/27/2020   BUN 12 12/27/2020   NA 135 12/27/2020   K 3.7 12/27/2020   CL 104 12/27/2020   CO2 25 12/27/2020   CBC Latest Ref Rng & Units 01/17/2021 12/27/2020 12/10/2020  WBC 3.4 - 10.8 x10E3/uL 7.2 8.2 7.0  Hemoglobin 11.1 - 15.9 g/dL 10.4(L) 10.1(L) 10.1(L)  Hematocrit 34.0 - 46.6 % 32.2(L) 31.8(L) 31.5(L)  Platelets 150 - 450 x10E3/uL 230 289 294    No results found for: HGBA1C Lab Results  Component Value Date   TSH 1.98 06/01/2019    ==================================================  COVID-19 Education: The signs and symptoms of COVID-19 were discussed with the patient and how to seek care for testing (follow up with PCP or arrange E-visit).    I spent a total of 20 minutes with the patient spent in direct patient consultation.  Additional time spent with chart review  / charting (  studies, outside notes, etc): 20 min Total Time: 40 min  Current medicines are reviewed at length with the patient today.  (+/-  concerns) none  This visit occurred during the SARS-CoV-2 public health emergency.  Safety protocols were in place, including screening questions prior to the visit, additional usage of staff PPE, and extensive cleaning of exam room while observing appropriate contact time as indicated for disinfecting solutions.  Notice: This dictation was prepared with Dragon dictation along with smart phrase technology. Any transcriptional errors that result from this process are unintentional and may not be corrected upon review.   Studies Ordered:  Orders Placed This Encounter  Procedures   LONG TERM MONITOR (3-14 DAYS)   EKG 12-Lead    Patient Instructions / Medication Changes & Studies & Tests Ordered   Patient Instructions  Medication Instructions:   No changes  *If you need a refill on your cardiac medications before your next appointment, please call your pharmacy*   Lab Work:  Not needed   Testing/Procedures:  Will be mailed to you in 5 to 10 days Your physician has recommended that you wear a holter monitor 14 day Zio. Holter monitors are medical devices that record the heart's electrical activity. Doctors most often use these monitors to diagnose arrhythmias. Arrhythmias are problems with the speed or rhythm of the heartbeat. The monitor is a small, portable device. You can wear one while you do your normal daily activities. This is usually used to diagnose what is causing palpitations/syncope (passing out).   Follow-Up: At [facility], you and your health needs are our priority.  As part of our continuing mission to provide you with exceptional heart care, we have created designated Provider Care Teams.  These Care Teams include your primary Cardiologist (physician) and Advanced Practice Providers (APPs -  Physician Assistants and Nurse Practitioners) who all work together to provide you with the care you need, when you need it.     Your next appointment:   2 month(s)  The  format for your next appointment:   In Person  Provider:   Glenetta Hew, MD    Other Instructions   ZIO XT- Long Term Monitor Instructions  Your physician has requested you wear a ZIO patch monitor for 14 days.  This is a single patch monitor. [employer] supplies one patch monitor per enrollment. Additional stickers are not available. Please do not apply patch if you will be having a Nuclear Stress Test,  Echocardiogram, Cardiac CT, MRI, or Chest Xray during the period you would be wearing the  monitor. The patch cannot be worn during these tests. You cannot remove and re-apply the  ZIO XT patch monitor.  Your ZIO patch monitor will be mailed 3 day USPS to your address on file. It may take 3-5 days  to receive your monitor after you have been enrolled.  Once you have received your monitor, please review the enclosed instructions. Your monitor  has already been registered assigning a specific monitor serial # to you.  Billing and Patient Assistance Program Information  We have supplied [employer] with any of your insurance information on file for billing purposes. [employer] offers a sliding scale Patient Assistance Program for patients that do not have  insurance, or whose insurance does not completely cover the cost of the ZIO monitor.  You must apply for the Patient Assistance Program to qualify for this discounted rate.  To apply, please call [employer] at [phone], select option 4, select option 2, ask to  apply for  Patient Assistance Program. Theodore Demark will ask your household income, and how many people  are in your household. They will quote your out-of-pocket cost based on that information.  [employer] will also be able to set up a 72-month, interest-free payment plan if needed.  Applying the monitor   Shave hair from upper left chest.  Hold abrader disc by orange tab. Rub abrader in 40 strokes over the upper left chest as  indicated in your monitor instructions.  Clean area with 4  enclosed alcohol pads. Let dry.  Apply patch as indicated in monitor instructions. Patch will be placed under collarbone on left  side of chest with arrow pointing upward.  Rub patch adhesive wings for 2 minutes. Remove white label marked "1". Remove the white  label marked "2". Rub patch adhesive wings for 2 additional minutes.  While looking in a mirror, press and release button in center of patch. A small green light will  flash 3-4 times. This will be your only indicator that the monitor has been turned on.  Do not shower for the first 24 hours. You may shower after the first 24 hours.  Press the button if you feel a symptom. You will hear a small click. Record Date, Time and  Symptom in the Patient Logbook.  When you are ready to remove the patch, follow instructions on the last 2 pages of Patient  Logbook. Stick patch monitor onto the last page of Patient Logbook.  Place Patient Logbook in the blue and white box. Use locking tab on box and tape box closed  securely. The blue and white box has prepaid postage on it. Please place it in the mailbox as  soon as possible. Your physician should have your test results approximately 7 days after the  monitor has been mailed back to [facility].  Call [place] at [phone] if you have questions regarding  your ZIO XT patch monitor. Call them immediately if you see an orange light blinking on your  monitor.  If your monitor falls off in less than 4 days, contact our Monitor department at [phone].  If your monitor becomes loose or falls off after 4 days call [employer] at [phone] for  suggestions on securing your monitor    Glenetta Hew, M.D., M.S. Interventional Cardiologist   Pager # [phone] Phone # [phone] [address] [place], [place] [address]   Thank you for choosing Heartcare at [facility]!!

## 2021-03-01 NOTE — Progress Notes (Unsigned)
Enrolled patient for a 14 day Zio XT  monitor to be mailed to patients home  °

## 2021-03-01 NOTE — Patient Instructions (Addendum)
Medication Instructions:   No changes  *If you need a refill on your cardiac medications before your next appointment, please call your pharmacy*   Lab Work:  Not needed   Testing/Procedures:  Will be mailed to you in 5 to 10 days Your physician has recommended that you wear a holter monitor 14 day Zio. Holter monitors are medical devices that record the heart's electrical activity. Doctors most often use these monitors to diagnose arrhythmias. Arrhythmias are problems with the speed or rhythm of the heartbeat. The monitor is a small, portable device. You can wear one while you do your normal daily activities. This is usually used to diagnose what is causing palpitations/syncope (passing out).   Follow-Up: At [facility], you and your health needs are our priority.  As part of our continuing mission to provide you with exceptional heart care, we have created designated Provider Care Teams.  These Care Teams include your primary Cardiologist (physician) and Advanced Practice Providers (APPs -  Physician Assistants and Nurse Practitioners) who all work together to provide you with the care you need, when you need it.     Your next appointment:   2 month(s)  The format for your next appointment:   In Person  Provider:   Bryan Lemma, MD    Other Instructions   ZIO XT- Long Term Monitor Instructions  Your physician has requested you wear a ZIO patch monitor for 14 days.  This is a single patch monitor. [employer] supplies one patch monitor per enrollment. Additional stickers are not available. Please do not apply patch if you will be having a Nuclear Stress Test,  Echocardiogram, Cardiac CT, MRI, or Chest Xray during the period you would be wearing the  monitor. The patch cannot be worn during these tests. You cannot remove and re-apply the  ZIO XT patch monitor.  Your ZIO patch monitor will be mailed 3 day USPS to your address on file. It may take 3-5 days  to receive your  monitor after you have been enrolled.  Once you have received your monitor, please review the enclosed instructions. Your monitor  has already been registered assigning a specific monitor serial # to you.  Billing and Patient Assistance Program Information  We have supplied [employer] with any of your insurance information on file for billing purposes. [employer] offers a sliding scale Patient Assistance Program for patients that do not have  insurance, or whose insurance does not completely cover the cost of the ZIO monitor.  You must apply for the Patient Assistance Program to qualify for this discounted rate.  To apply, please call [employer] at [phone], select option 4, select option 2, ask to apply for  Patient Assistance Program. Meredeth Ide will ask your household income, and how many people  are in your household. They will quote your out-of-pocket cost based on that information.  [employer] will also be able to set up a 76-month, interest-free payment plan if needed.  Applying the monitor   Shave hair from upper left chest.  Hold abrader disc by orange tab. Rub abrader in 40 strokes over the upper left chest as  indicated in your monitor instructions.  Clean area with 4 enclosed alcohol pads. Let dry.  Apply patch as indicated in monitor instructions. Patch will be placed under collarbone on left  side of chest with arrow pointing upward.  Rub patch adhesive wings for 2 minutes. Remove white label marked "1". Remove the white  label marked "2". Rub patch adhesive wings  for 2 additional minutes.  While looking in a mirror, press and release button in center of patch. A small green light will  flash 3-4 times. This will be your only indicator that the monitor has been turned on.  Do not shower for the first 24 hours. You may shower after the first 24 hours.  Press the button if you feel a symptom. You will hear a small click. Record Date, Time and  Symptom in the Patient Logbook.  When you  are ready to remove the patch, follow instructions on the last 2 pages of Patient  Logbook. Stick patch monitor onto the last page of Patient Logbook.  Place Patient Logbook in the blue and white box. Use locking tab on box and tape box closed  securely. The blue and white box has prepaid postage on it. Please place it in the mailbox as  soon as possible. Your physician should have your test results approximately 7 days after the  monitor has been mailed back to [facility].  Call [facility] Customer Care at [phone] if you have questions regarding  your ZIO XT patch monitor. Call them immediately if you see an orange light blinking on your  monitor.  If your monitor falls off in less than 4 days, contact our Monitor department at [phone].  If your monitor becomes loose or falls off after 4 days call [employer] at [phone] for  suggestions on securing your monitor

## 2021-03-03 ENCOUNTER — Inpatient Hospital Stay (HOSPITAL_COMMUNITY)
Admission: AD | Admit: 2021-03-03 | Discharge: 2021-03-03 | Disposition: A | Discharge status: Home / Self Care | Payer: BC Managed Care – PPO | Attending: Obstetrics and Gynecology | Admitting: Obstetrics and Gynecology

## 2021-03-03 ENCOUNTER — Encounter: Payer: Self-pay | Admitting: Cardiology

## 2021-03-03 ENCOUNTER — Encounter (HOSPITAL_COMMUNITY): Payer: Self-pay | Admitting: Obstetrics and Gynecology

## 2021-03-03 ENCOUNTER — Emergency Department (HOSPITAL_COMMUNITY)
Admission: EM | Admit: 2021-03-03 | Discharge: 2021-03-03 | Disposition: A | Discharge status: Home / Self Care | Payer: BC Managed Care – PPO | Source: Home / Self Care

## 2021-03-03 ENCOUNTER — Other Ambulatory Visit: Payer: Self-pay

## 2021-03-03 ENCOUNTER — Inpatient Hospital Stay (HOSPITAL_BASED_OUTPATIENT_CLINIC_OR_DEPARTMENT_OTHER): Payer: BC Managed Care – PPO

## 2021-03-03 DIAGNOSIS — O4692 Antepartum hemorrhage, unspecified, second trimester: Secondary | ICD-10-CM | POA: Diagnosis not present

## 2021-03-03 DIAGNOSIS — O209 Hemorrhage in early pregnancy, unspecified: Secondary | ICD-10-CM | POA: Diagnosis not present

## 2021-03-03 DIAGNOSIS — R109 Unspecified abdominal pain: Secondary | ICD-10-CM | POA: Diagnosis not present

## 2021-03-03 DIAGNOSIS — Z3A14 14 weeks gestation of pregnancy: Secondary | ICD-10-CM | POA: Diagnosis not present

## 2021-03-03 DIAGNOSIS — O4402 Placenta previa specified as without hemorrhage, second trimester: Secondary | ICD-10-CM

## 2021-03-03 DIAGNOSIS — O26892 Other specified pregnancy related conditions, second trimester: Secondary | ICD-10-CM | POA: Diagnosis not present

## 2021-03-03 DIAGNOSIS — O4412 Placenta previa with hemorrhage, second trimester: Secondary | ICD-10-CM | POA: Diagnosis not present

## 2021-03-03 DIAGNOSIS — O4690 Antepartum hemorrhage, unspecified, unspecified trimester: Secondary | ICD-10-CM

## 2021-03-03 NOTE — Assessment & Plan Note (Signed)
Intermittent rapid heart rate spells lasting 5 to 10 minutes sounds like it could very well be just sinus tachycardia or potentially SVT/PAT.  Probably not going that fast because she is not having any real dizziness associated. Eczema also cannot be sure that this is not related to some type of pregnancy related issue.  For now we will start off with a Zio patch monitor for 14 days.  Low threshold to consider further studies such as echocardiogram.

## 2021-03-03 NOTE — MAU Provider Note (Signed)
History     CSN: [id number]  Arrival date and time: 03/03/21 0745   Event Date/Time   First Provider Initiated Contact with Patient 03/03/21 [phone]      Chief Complaint  Patient presents with   Vaginal Bleeding   Abdominal Pain   HPI Shelby Obrien is a 30 y.o. [id number] at [redacted]w[redacted]d who presents with bleeding. She states she woke up this morning and passed a large blood clot. She is still seeing some bleeding. She reports intermittent abdominal pain that she rates a 4/10. She has not taken anything for the pain. She reports she had an ultrasound on 10/13 that was normal. She has not had sex or anything in the vagina in the last 24 hours.   OB History     Gravida  4   Para  2   Term  2   Preterm      AB  1   Living  2      SAB  1   IAB      Ectopic      Multiple  0   Live Births  2           Past Medical History:  Diagnosis Date   Asthma    Migraines    Obesity    BMI>40   Supervision of high risk pregnancy, antepartum, first trimester 02/26/2017   Clinic [facility] Prenatal Labs  Dating  LMP=7w [place] Blood type: A Pos  Genetic Screen 1 Screen: neg   AFP:     Quad:     NIPS: Antibody:   Anatomic [place]  complete, normal Rubella: Immune Varicella: Immune  GTT Early:  16             Third trimester: 101 RPR: NR  Rhogam Not applicable HBsAg: Negative  TDaP vaccine 08/07/17                       Flu Shot: HIV: NR  Baby Food  Breast                           Past Surgical History:  Procedure Laterality Date   NO PAST SURGERIES     WISDOM TOOTH EXTRACTION      Family History  Problem Relation Age of Onset   Cervical cancer Mother    Diabetes Maternal Grandmother    Heart disease Maternal Grandmother    Diabetes Paternal Grandmother    Heart disease Paternal Grandmother    Diabetes Paternal Uncle    Diabetes Sister     Social History   Tobacco Use   Smoking status: Former    Packs/day: 0.00    Types: Cigarettes   Smokeless tobacco: Never  Vaping Use    Vaping Use: Never used  Substance Use Topics   Alcohol use: No   Drug use: No    Allergies:  Allergies  Allergen Reactions   Shellfish Allergy Anaphylaxis   Banana    Blueberry Flavor    Other     BLUEBERRIES, BANANAS, TREE NUTS, RASPBERRIES, GRAPES, POLLENS/SEASONAL.   Raspberry     Medications Prior to Admission  Medication Sig Dispense Refill Last Dose   albuterol (VENTOLIN HFA) 108 (90 Base) MCG/ACT inhaler Inhale 2 puffs into the lungs every 6 (six) hours as needed for wheezing or shortness of breath. 8 g 0 Past Month   promethazine (PHENERGAN) 25 MG tablet Take 1 tablet (25 mg  total) by mouth every 8 (eight) hours as needed for nausea or vomiting. 15 tablet 0     Review of Systems  Constitutional: Negative.  Negative for fatigue and fever.  HENT: Negative.    Respiratory: Negative.  Negative for shortness of breath.   Cardiovascular: Negative.  Negative for chest pain.  Gastrointestinal:  Positive for abdominal pain. Negative for constipation, diarrhea, nausea and vomiting.  Genitourinary:  Positive for vaginal bleeding. Negative for dysuria and vaginal discharge.  Neurological: Negative.  Negative for dizziness and headaches.  Physical Exam   Blood pressure (!) 142/83, pulse (!) 116, temperature 98.5 F (36.9 C), temperature source Oral, resp. rate 20, height 5\' 4"  (1.626 m), weight 115.9 kg, last menstrual period 11/24/2020, SpO2 99 %, unknown if currently breastfeeding.  Physical Exam Vitals and nursing note reviewed.  Constitutional:      General: She is not in acute distress.    Appearance: She is well-developed.  HENT:     Head: Normocephalic.  Eyes:     Pupils: Pupils are equal, round, and reactive to light.  Cardiovascular:     Rate and Rhythm: Normal rate and regular rhythm.     Heart sounds: Normal heart sounds.  Pulmonary:     Effort: Pulmonary effort is normal. No respiratory distress.     Breath sounds: Normal breath sounds.  Abdominal:      General: Bowel sounds are normal. There is no distension.     Palpations: Abdomen is soft.     Tenderness: There is no abdominal tenderness.  Genitourinary:    Comments: SSE: small amount of dark red blood in vault. Skin:    General: Skin is warm and dry.  Neurological:     Mental Status: She is alert and oriented to person, place, and time.  Psychiatric:        Mood and Affect: Mood normal.        Behavior: Behavior normal.        Thought Content: Thought content normal.        Judgment: Judgment normal.   FHT: 164 bpm MAU Course  Procedures  MDM 11/26/2020 MFM OB Limited- posterior previa, normal AFI  Assessment and Plan   1. Placenta previa in second trimester   2. [redacted] weeks gestation of pregnancy   3. Vaginal bleeding during pregnancy    -Discharge home in stable condition -Strict bleeding and pelvic pain precautions discussed -Patient advised to follow-up with OB this week to check on bleeding -Patient may return to MAU as needed or if her condition were to change or worsen   [place] CNM 03/03/2021, 8:08 AM

## 2021-03-03 NOTE — Discharge Instructions (Signed)

## 2021-03-03 NOTE — MAU Note (Signed)
Presents with c/o heavy VB and lower abdominal pain.  Reports VB began @ 0645 this morning.  States went to the restroom and passed a large blood clot.

## 2021-03-04 ENCOUNTER — Encounter: Payer: Self-pay | Admitting: Obstetrics and Gynecology

## 2021-03-04 ENCOUNTER — Ambulatory Visit (INDEPENDENT_AMBULATORY_CARE_PROVIDER_SITE_OTHER): Payer: BC Managed Care – PPO | Admitting: Obstetrics and Gynecology

## 2021-03-04 VITALS — BP 120/70 | Ht 64.0 in | Wt 255.2 lb

## 2021-03-04 DIAGNOSIS — O0992 Supervision of high risk pregnancy, unspecified, second trimester: Secondary | ICD-10-CM

## 2021-03-04 DIAGNOSIS — R002 Palpitations: Secondary | ICD-10-CM | POA: Diagnosis not present

## 2021-03-04 DIAGNOSIS — Z23 Encounter for immunization: Secondary | ICD-10-CM | POA: Diagnosis not present

## 2021-03-04 DIAGNOSIS — Z3A14 14 weeks gestation of pregnancy: Secondary | ICD-10-CM

## 2021-03-04 LAB — POCT URINALYSIS DIPSTICK OB: Glucose, UA: NEGATIVE

## 2021-03-04 NOTE — Patient Instructions (Signed)

## 2021-03-04 NOTE — Progress Notes (Signed)
Routine Prenatal Care Visit  Subjective  Shelby Obrien is a 30 y.o. [phone] at [redacted]w[redacted]d being seen today for ongoing prenatal care.  She is currently monitored for the following issues for this low-risk pregnancy and has Migraines; Asthma; Maternal obesity affecting pregnancy, antepartum; BMI 40.0-44.9, adult (HCC); Nausea and vomiting in pregnancy; Anxiety and depression; Abdominal pain in pregnancy; Postpartum care following vaginal delivery; Eczema; Supervision of high risk pregnancy, antepartum; and Rapid palpitations on their problem list.  ----------------------------------------------------------------------------------- Patient reports  bleeding has decreased today, not heavy enough to soak a pad.  Woke up yesterday and passed a large palm sized blood clot after feeling a poping sensation .  Wass seen and evaluated at MAU in [place]. Contractions: Not present. Vag. Bleeding: Bloody Show.  Movement: Absent. Denies leaking of fluid.  ----------------------------------------------------------------------------------- The following portions of the patient's history were reviewed and updated as appropriate: allergies, current medications, past family history, past medical history, past social history, past surgical history and problem list. Problem list updated.   Objective  Blood pressure 120/70, height 5\' 4"  (1.626 m), weight 255 lb 3.2 oz (115.8 kg), last menstrual period 11/24/2020, unknown if currently breastfeeding. Pregravid weight Pregravid weight not on file Total Weight Gain Not found. Urinalysis:      Fetal Status: Fetal Heart Rate (bpm): 158   Movement: Absent     General:  Alert, oriented and cooperative. Patient is in no acute distress.  Skin: Skin is warm and dry. No rash noted.   Cardiovascular: Normal heart rate noted  Respiratory: Normal respiratory effort, no problems with respiration noted  Abdomen: Soft, gravid, appropriate for gestational age.  Pain/Pressure: Absent     Pelvic:  Cervical exam deferred        Extremities: Normal range of motion.     Mental Status: Normal mood and affect. Normal behavior. Normal judgment and thought content.     Assessment   30 y.o. 38 at [redacted]w[redacted]d by  08/31/2021, by Last Menstrual Period presenting for routine prenatal visit  Plan   September 2022 Problems (from 12/27/20 to present)     Problem Noted Resolved   Supervision of high risk pregnancy, antepartum 01/17/2021 by 01/19/2021, CNM No   Overview Addendum 03/04/2021  4:29 PM by 13/10/2020, MD     Nursing Staff Provider  Office Location  [facility] Dating    Language  English Anatomy Natale Milch    Flu Vaccine  03/04/2021 Genetic Screen  NIPS: Normal XY  TDaP vaccine    Hgb A1C or  GTT Early : Third trimester :   Covid    A+  Rhogam  Not needed Blood Type A+  Feeding Plan  Antibody Negative (09/22 1557)neg  Contraception  Rubella 4.28 (09/22 1557)immune  Circumcision  RPR Non Reactive (09/22 1557) neg  Pediatrician   HBsAg Negative (09/22 1557) neg  Support Person  HIV Non Reactive (09/22 1557)NR  Prenatal Classes  Varicella immune    GBS  (For PCN allergy, check sensitivities)   BTL Consent   Hepatitis C negative  VBAC Consent  Pap      Hgb Electro      CF negative     SMA negative                Flu shot today Reviewed bleeding precautions Declines work to extend leave at this time  Gestational age appropriate obstetric precautions including but not limited to vaginal bleeding, contractions, leaking of fluid and fetal movement  were reviewed in detail with the patient.    Return in about 1 week (around 03/11/2021) for ROB as planned.  Natale Milch MD [facility], [facility] 03/04/2021, 4:30 PM

## 2021-03-11 ENCOUNTER — Other Ambulatory Visit: Payer: Self-pay | Admitting: Obstetrics and Gynecology

## 2021-03-11 ENCOUNTER — Encounter: Payer: Self-pay | Admitting: Obstetrics

## 2021-03-11 ENCOUNTER — Other Ambulatory Visit: Payer: Self-pay

## 2021-03-11 ENCOUNTER — Other Ambulatory Visit: Payer: BC Managed Care – PPO

## 2021-03-11 ENCOUNTER — Ambulatory Visit (INDEPENDENT_AMBULATORY_CARE_PROVIDER_SITE_OTHER): Payer: BC Managed Care – PPO | Admitting: Obstetrics

## 2021-03-11 VITALS — BP 122/74 | Wt 258.0 lb

## 2021-03-11 DIAGNOSIS — O99210 Obesity complicating pregnancy, unspecified trimester: Secondary | ICD-10-CM

## 2021-03-11 DIAGNOSIS — O0992 Supervision of high risk pregnancy, unspecified, second trimester: Secondary | ICD-10-CM

## 2021-03-11 DIAGNOSIS — Z3A15 15 weeks gestation of pregnancy: Secondary | ICD-10-CM

## 2021-03-11 NOTE — Progress Notes (Signed)
Routine Prenatal Care Visit  Subjective  Shelby Obrien is a 30 y.o. [phone] at [redacted]w[redacted]d being seen today for ongoing prenatal care.  She is currently monitored for the following issues for this high-risk pregnancy and has Migraines; Asthma; Maternal obesity affecting pregnancy, antepartum; BMI 40.0-44.9, adult ([place]); Nausea and vomiting in pregnancy; Anxiety and depression; Abdominal pain in pregnancy; Eczema; Supervision of high risk pregnancy, antepartum; and Rapid palpitations on their problem list.  ----------------------------------------------------------------------------------- Patient reports no complaints.  She has been wearing a holter monitor per Cardiology for palpitations; shares that she does think that anxiety is related to this. Contractions: Not present. Vag. Bleeding: None.  Movement: Absent. Leaking Fluid denies.  ----------------------------------------------------------------------------------- The following portions of the patient's history were reviewed and updated as appropriate: allergies, current medications, past family history, past medical history, past social history, past surgical history and problem list. Problem list updated.  Objective  Blood pressure 122/74, weight 258 lb (117 kg), last menstrual period 11/24/2020, unknown if currently breastfeeding. Pregravid weight Pregravid weight not on file Total Weight Gain Not found. Urinalysis: Urine Protein    Urine Glucose    Fetal Status:     Movement: Absent     General:  Alert, oriented and cooperative. Patient is in no acute distress.  Skin: Skin is warm and dry. No rash noted.   Cardiovascular: Normal heart rate noted  Respiratory: Normal respiratory effort, no problems with respiration noted  Abdomen: Soft, gravid, appropriate for gestational age. Pain/Pressure: Absent     Pelvic:  Cervical exam deferred        Extremities: Normal range of motion.     Mental Status: Normal mood and affect. Normal  behavior. Normal judgment and thought content.   Assessment   30 y.o. [id number] at [redacted]w[redacted]d by  08/31/2021, by Last Menstrual Period presenting for routine prenatal visit  Plan   September 2022 Problems (from 12/27/20 to present)    Problem Noted Resolved   Supervision of high risk pregnancy, antepartum 01/17/2021 by Imagene Riches, CNM No   Overview Addendum 03/04/2021  4:29 PM by Homero Fellers, MD     Nursing Staff Provider  Office Location  [facility] Dating    Language  English Anatomy US    Flu Vaccine  03/04/2021 Genetic Screen  NIPS: Normal XY  TDaP vaccine    Hgb A1C or  GTT Early : Third trimester :   Covid    A+  Rhogam  Not needed Blood Type A+  Feeding Plan  Antibody Negative (09/22 1557)neg  Contraception  Rubella 4.28 (09/22 1557)immune  Circumcision  RPR Non Reactive (09/22 1557) neg  Pediatrician   HBsAg Negative (09/22 1557) neg  Support Person  HIV Non Reactive (09/22 1557)NR  Prenatal Classes  Varicella immune    GBS  (For PCN allergy, check sensitivities)   BTL Consent   Hepatitis C negative  VBAC Consent  Pap      Hgb Electro      CF negative     SMA negative                 Preterm labor symptoms and general obstetric precautions including but not limited to vaginal bleeding, contractions, leaking of fluid and fetal movement were reviewed in detail with the patient. Please refer to After Visit Summary for other counseling recommendations.  Early 1hr GTT today. Her anatomy scan is ordered for 4 week out.   Return in about 4 weeks (around 04/08/2021) for return OB.  Mirna Mires, CNM  03/11/2021 9:19 AM

## 2021-03-11 NOTE — Progress Notes (Signed)
No vb. No lof. Early glucose today

## 2021-03-12 LAB — GLUCOSE TOLERANCE, 1 HOUR: Glucose, 1Hr PP: 99 mg/dL (ref 70–199)

## 2021-03-24 ENCOUNTER — Encounter (HOSPITAL_COMMUNITY): Payer: Self-pay | Admitting: Obstetrics and Gynecology

## 2021-03-24 ENCOUNTER — Other Ambulatory Visit: Payer: Self-pay

## 2021-03-24 ENCOUNTER — Inpatient Hospital Stay (HOSPITAL_COMMUNITY)
Admission: AD | Admit: 2021-03-24 | Discharge: 2021-03-24 | Disposition: A | Discharge status: Home / Self Care | Payer: BC Managed Care – PPO | Attending: Obstetrics and Gynecology | Admitting: Obstetrics and Gynecology

## 2021-03-24 DIAGNOSIS — Z3A17 17 weeks gestation of pregnancy: Secondary | ICD-10-CM | POA: Diagnosis not present

## 2021-03-24 DIAGNOSIS — O4402 Placenta previa specified as without hemorrhage, second trimester: Secondary | ICD-10-CM

## 2021-03-24 DIAGNOSIS — O209 Hemorrhage in early pregnancy, unspecified: Secondary | ICD-10-CM | POA: Diagnosis not present

## 2021-03-24 NOTE — MAU Note (Signed)
...  Shelby Obrien is a 30 y.o. at [redacted]w[redacted]d here in MAU reporting: Lower abdominal pain that began a little before 0800 this morning while working at [employer]. She states she then felt a gush of fluids come out and when she went to look she noticed bright red vaginal bleeding. She denies seeing any blood clots. She states she still feels the blood trickling out and is wearing tissues to line her underwear as she did not have a pad.   Patient states she was diagnosed with placenta previa.   Pain score: 8/10 lower abdominal  Vitals:   03/24/21 0950  BP: 135/87  Pulse: 85  Resp: 19  Temp: 98.1 F (36.7 C)  SpO2: 100%     FHT: 145 doppler

## 2021-03-24 NOTE — MAU Provider Note (Signed)
History     CSN: [id number]  Arrival date and time: 03/24/21 0941   Event Date/Time   First Provider Initiated Contact with Patient 03/24/21 1014      Chief Complaint  Patient presents with   Vaginal Bleeding   Abdominal Pain   HPI Shelby Obrien is a 30 y.o. [id number] at [redacted]w[redacted]d who presents with vaginal bleeding. She states she was at work at [employer] she felt a gush and saw bleeding in her underwear. She states it remained heavy while she was at work. She states it has slowed down since she came to the hospital. She reports some cramping when the pain happened but none now. She has a known posterior previa. She denies any leaking of fluid. She thinks she feels fetal movement.   OB History     Gravida  4   Para  2   Term  2   Preterm      AB  1   Living  2      SAB  1   IAB      Ectopic      Multiple  0   Live Births  2           Past Medical History:  Diagnosis Date   Asthma    Migraines    Obesity    BMI>40   Postpartum care following vaginal delivery 10/03/2017   Supervision of high risk pregnancy, antepartum, first trimester 02/26/2017   Clinic [facility] Prenatal Labs  Dating  LMP=7w [place] Blood type: A Pos  Genetic Screen 1 Screen: neg   AFP:     Quad:     NIPS: Antibody:   Anatomic [place]  complete, normal Rubella: Immune Varicella: Immune  GTT Early:  32             Third trimester: 101 RPR: NR  Rhogam Not applicable HBsAg: Negative  TDaP vaccine 08/07/17                       Flu Shot: HIV: NR  Baby Food  Breast                           Past Surgical History:  Procedure Laterality Date   NO PAST SURGERIES     WISDOM TOOTH EXTRACTION      Family History  Problem Relation Age of Onset   Cervical cancer Mother    Diabetes Maternal Grandmother    Heart disease Maternal Grandmother    Diabetes Paternal Grandmother    Heart disease Paternal Grandmother    Diabetes Paternal Uncle    Diabetes Sister     Social History   Tobacco Use    Smoking status: Former    Packs/day: 0.00    Types: Cigarettes   Smokeless tobacco: Never  Vaping Use   Vaping Use: Never used  Substance Use Topics   Alcohol use: No   Drug use: No    Allergies:  Allergies  Allergen Reactions   Shellfish Allergy Anaphylaxis   Banana    Blueberry Flavor    Other     BLUEBERRIES, BANANAS, TREE NUTS, RASPBERRIES, GRAPES, POLLENS/SEASONAL.   Raspberry     Medications Prior to Admission  Medication Sig Dispense Refill Last Dose   promethazine (PHENERGAN) 25 MG tablet Take 1 tablet (25 mg total) by mouth every 8 (eight) hours as needed for nausea or vomiting. 15 tablet 0 03/24/2021  albuterol (VENTOLIN HFA) 108 (90 Base) MCG/ACT inhaler Inhale 2 puffs into the lungs every 6 (six) hours as needed for wheezing or shortness of breath. 8 g 0     Review of Systems  Constitutional: Negative.  Negative for fatigue and fever.  HENT: Negative.    Respiratory: Negative.  Negative for shortness of breath.   Cardiovascular: Negative.  Negative for chest pain.  Gastrointestinal:  Positive for abdominal pain. Negative for constipation, diarrhea, nausea and vomiting.  Genitourinary:  Positive for vaginal bleeding. Negative for dysuria and vaginal discharge.  Neurological: Negative.  Negative for dizziness and headaches.  Physical Exam   Blood pressure 135/87, pulse 85, temperature 98.1 F (36.7 C), temperature source Oral, resp. rate 19, height 5\' 4"  (1.626 m), weight 118.1 kg, last menstrual period 11/24/2020, SpO2 100 %, unknown if currently breastfeeding.  Physical Exam Vitals and nursing note reviewed.  Constitutional:      General: She is not in acute distress.    Appearance: She is well-developed.  HENT:     Head: Normocephalic.  Eyes:     Pupils: Pupils are equal, round, and reactive to light.  Cardiovascular:     Rate and Rhythm: Normal rate and regular rhythm.     Heart sounds: Normal heart sounds.  Pulmonary:     Effort: Pulmonary effort  is normal. No respiratory distress.     Breath sounds: Normal breath sounds.  Abdominal:     General: Bowel sounds are normal. There is no distension.     Palpations: Abdomen is soft.     Tenderness: There is no abdominal tenderness.  Genitourinary:    Comments: SSE: small amount of dark red blood in vault, cervix visually closed and no active bleeding Skin:    General: Skin is warm and dry.  Neurological:     Mental Status: She is alert and oriented to person, place, and time.  Psychiatric:        Mood and Affect: Mood normal.        Behavior: Behavior normal.        Thought Content: Thought content normal.        Judgment: Judgment normal.   FHT: 145 bpm  MAU Course  Procedures  MDM Prenatal records from private office reviewed. Pregnancy complicated by previa. Labs ordered and reviewed.  SSE: minimal bleeding Lengthy discussion of expectation for bleeding and when to return to hospital. Emphasized continued pelvic rest.   Assessment and Plan   1. Placenta previa in second trimester   2. Vaginal bleeding affecting early pregnancy   3. [redacted] weeks gestation of pregnancy    -Discharge home in stable condition -Vaginal bleeding precautions discussed -Patient advised to follow-up with OB as scheduled for prenatal care -Patient may return to MAU as needed or if her condition were to change or worsen   11/26/2020 CNM 03/24/2021, 10:14 AM

## 2021-03-24 NOTE — Discharge Instructions (Signed)

## 2021-04-08 ENCOUNTER — Other Ambulatory Visit: Payer: Self-pay | Admitting: Obstetrics & Gynecology

## 2021-04-08 DIAGNOSIS — O99210 Obesity complicating pregnancy, unspecified trimester: Secondary | ICD-10-CM

## 2021-04-11 ENCOUNTER — Encounter: Payer: Self-pay | Admitting: *Deleted

## 2021-04-11 ENCOUNTER — Ambulatory Visit: Payer: BC Managed Care – PPO | Attending: Obstetrics and Gynecology

## 2021-04-11 ENCOUNTER — Other Ambulatory Visit: Payer: Self-pay | Admitting: *Deleted

## 2021-04-11 ENCOUNTER — Other Ambulatory Visit: Payer: Self-pay

## 2021-04-11 ENCOUNTER — Ambulatory Visit: Payer: BC Managed Care – PPO | Admitting: *Deleted

## 2021-04-11 VITALS — BP 123/73 | HR 96

## 2021-04-11 DIAGNOSIS — O0990 Supervision of high risk pregnancy, unspecified, unspecified trimester: Secondary | ICD-10-CM

## 2021-04-11 DIAGNOSIS — O99212 Obesity complicating pregnancy, second trimester: Secondary | ICD-10-CM

## 2021-04-11 DIAGNOSIS — E669 Obesity, unspecified: Secondary | ICD-10-CM

## 2021-04-11 DIAGNOSIS — O4692 Antepartum hemorrhage, unspecified, second trimester: Secondary | ICD-10-CM

## 2021-04-11 DIAGNOSIS — Z362 Encounter for other antenatal screening follow-up: Secondary | ICD-10-CM

## 2021-04-11 DIAGNOSIS — O0992 Supervision of high risk pregnancy, unspecified, second trimester: Secondary | ICD-10-CM | POA: Diagnosis not present

## 2021-04-11 DIAGNOSIS — Z3A19 19 weeks gestation of pregnancy: Secondary | ICD-10-CM

## 2021-04-12 ENCOUNTER — Encounter: Payer: BC Managed Care – PPO | Admitting: Obstetrics

## 2021-04-15 ENCOUNTER — Other Ambulatory Visit: Payer: BC Managed Care – PPO

## 2021-04-16 ENCOUNTER — Ambulatory Visit (INDEPENDENT_AMBULATORY_CARE_PROVIDER_SITE_OTHER): Payer: BC Managed Care – PPO | Admitting: Obstetrics

## 2021-04-16 ENCOUNTER — Other Ambulatory Visit: Payer: Self-pay

## 2021-04-16 VITALS — BP 120/74 | Wt 263.0 lb

## 2021-04-16 DIAGNOSIS — O99210 Obesity complicating pregnancy, unspecified trimester: Secondary | ICD-10-CM

## 2021-04-16 DIAGNOSIS — O0990 Supervision of high risk pregnancy, unspecified, unspecified trimester: Secondary | ICD-10-CM

## 2021-04-16 DIAGNOSIS — O0992 Supervision of high risk pregnancy, unspecified, second trimester: Secondary | ICD-10-CM

## 2021-04-16 DIAGNOSIS — Z3A20 20 weeks gestation of pregnancy: Secondary | ICD-10-CM

## 2021-04-16 NOTE — Progress Notes (Signed)
Routine Prenatal Care Visit  Subjective  Shelby Obrien is a 30 y.o. [phone] at [redacted]w[redacted]d being seen today for ongoing prenatal care.  She is currently monitored for the following issues for this high-risk pregnancy and has Migraines; Asthma; Maternal obesity affecting pregnancy, antepartum; BMI 40.0-44.9, adult ([facility]); Nausea and vomiting in pregnancy; Anxiety and depression; Abdominal pain in pregnancy; Eczema; Supervision of high risk pregnancy, antepartum; and Rapid palpitations on their problem list.  ----------------------------------------------------------------------------------- Patient reports no complaints.  She had anatomy scan and the Cleatrice Burke has resolved. She is having a boy. Contractions: Not present. Vag. Bleeding: None.  Movement: Present. Leaking Fluid denies.  ----------------------------------------------------------------------------------- The following portions of the patient's history were reviewed and updated as appropriate: allergies, current medications, past family history, past medical history, past social history, past surgical history and problem list. Problem list updated.  Objective  Blood pressure 120/74, weight 263 lb (119.3 kg), last menstrual period 11/24/2020, unknown if currently breastfeeding. Pregravid weight Pregravid weight not on file Total Weight Gain Not found. Urinalysis: Urine Protein    Urine Glucose    Fetal Status:     Movement: Present     General:  Alert, oriented and cooperative. Patient is in no acute distress.  Skin: Skin is warm and dry. No rash noted.   Cardiovascular: Normal heart rate noted  Respiratory: Normal respiratory effort, no problems with respiration noted  Abdomen: Soft, gravid, appropriate for gestational age. Pain/Pressure: Absent     Pelvic:  Cervical exam deferred        Extremities: Normal range of motion.     Mental Status: Normal mood and affect. Normal behavior. Normal judgment and thought content.    Assessment   30 y.o. [id number] at [redacted]w[redacted]d by  08/31/2021, by Last Menstrual Period presenting for routine prenatal visit  Plan   September 2022 Problems (from 12/27/20 to present)    Problem Noted Resolved   Supervision of high risk pregnancy, antepartum 01/17/2021 by Mirna Mires, CNM No   Overview Addendum 04/16/2021  3:10 PM by Mirna Mires, CNM     Nursing Staff Provider  Office Location  [facility] Dating    Language  English Anatomy US    Flu Vaccine  03/04/2021 Genetic Screen  NIPS: Normal XY  TDaP vaccine    Hgb A1C or  GTT Early : Third trimester :   Covid    A+  Rhogam  Not needed Blood Type A+  Feeding Plan  Antibody Negative (09/22 1557)neg  Contraception  Rubella 4.28 (09/22 1557)immune  Circumcision  RPR Non Reactive (09/22 1557) neg  Pediatrician   HBsAg Negative (09/22 1557) neg  Support Person  HIV Non Reactive (09/22 1557)NR  Prenatal Classes  Varicella immune    GBS  (For PCN allergy, check sensitivities)   BTL Consent   Hepatitis C negative  VBAC Consent  Pap  NILM    Hgb Electro      CF negative     SMA negative                 Preterm labor symptoms and general obstetric precautions including but not limited to vaginal bleeding, contractions, leaking of fluid and fetal movement were reviewed in detail with the patient. Please refer to After Visit Summary for other counseling recommendations.   Return in about 4 weeks (around 05/14/2021) for return OB with MD.  She will have another anatomy scan in 4 weeks. Advised she needs to be on a daily ASA ( she  has not been taking) and will have an anesthesia consult furing the pregnancy.  Imagene Riches, CNM  04/16/2021 3:11 PM

## 2021-04-16 NOTE — Progress Notes (Signed)
No vb. No lof.  

## 2021-04-25 ENCOUNTER — Inpatient Hospital Stay (HOSPITAL_COMMUNITY)
Admission: AD | Admit: 2021-04-25 | Discharge: 2021-04-25 | Disposition: A | Discharge status: Home / Self Care | Payer: BC Managed Care – PPO | Attending: Family Medicine | Admitting: Family Medicine

## 2021-04-25 ENCOUNTER — Other Ambulatory Visit: Payer: Self-pay

## 2021-04-25 ENCOUNTER — Encounter (HOSPITAL_COMMUNITY): Payer: Self-pay | Admitting: Family Medicine

## 2021-04-25 DIAGNOSIS — O26899 Other specified pregnancy related conditions, unspecified trimester: Secondary | ICD-10-CM | POA: Diagnosis not present

## 2021-04-25 DIAGNOSIS — O26892 Other specified pregnancy related conditions, second trimester: Secondary | ICD-10-CM | POA: Diagnosis not present

## 2021-04-25 DIAGNOSIS — Z3A21 21 weeks gestation of pregnancy: Secondary | ICD-10-CM | POA: Diagnosis not present

## 2021-04-25 DIAGNOSIS — K5900 Constipation, unspecified: Secondary | ICD-10-CM | POA: Insufficient documentation

## 2021-04-25 DIAGNOSIS — R109 Unspecified abdominal pain: Secondary | ICD-10-CM | POA: Diagnosis not present

## 2021-04-25 DIAGNOSIS — O99612 Diseases of the digestive system complicating pregnancy, second trimester: Secondary | ICD-10-CM | POA: Diagnosis not present

## 2021-04-25 DIAGNOSIS — O0990 Supervision of high risk pregnancy, unspecified, unspecified trimester: Secondary | ICD-10-CM | POA: Diagnosis not present

## 2021-04-25 HISTORY — DX: Tachycardia, unspecified: R00.0

## 2021-04-25 LAB — WET PREP, GENITAL
Clue Cells Wet Prep HPF POC: NONE SEEN
Sperm: NONE SEEN
Trich, Wet Prep: NONE SEEN
WBC, Wet Prep HPF POC: >=10 — AB (ref <10)
Yeast Wet Prep HPF POC: NONE SEEN

## 2021-04-25 LAB — URINALYSIS, ROUTINE W REFLEX MICROSCOPIC
Bilirubin Urine: NEGATIVE
Glucose, UA: NEGATIVE mg/dL
Hgb urine dipstick: NEGATIVE
Ketones, ur: NEGATIVE mg/dL
Leukocytes,Ua: NEGATIVE
Nitrite: NEGATIVE
Protein, ur: NEGATIVE mg/dL
Specific Gravity, Urine: 1.024 (ref 1.005–1.030)
pH: 7 (ref 5.0–8.0)

## 2021-04-25 MED ORDER — ACETAMINOPHEN 500 MG PO TABS
1000.0000 mg | ORAL_TABLET | Freq: Once | ORAL | Status: AC
  Administered 2021-04-25: 22:00:00 1000 mg via ORAL
  Filled 2021-04-25: qty 2

## 2021-04-25 NOTE — Discharge Instructions (Signed)
Safe Medications in Pregnancy  ? ? ?Acne: ?Benzoyl Peroxide ?Salicylic Acid ? ?Backache/Headache: ?Tylenol: 2 regular strength every 4 hours OR ?             2 Extra strength every 6 hours ? ?Colds/Coughs/Allergies: ?Benadryl (alcohol free) 25 mg every 6 hours as needed ?Breath right strips ?Claritin ?Cepacol throat lozenges ?Chloraseptic throat spray ?Cold-Eeze- up to three times per day ?Cough drops, alcohol free ?Flonase (by prescription only) ?Guaifenesin ?Mucinex ?Robitussin DM (plain only, alcohol free) ?Saline nasal spray/drops ?Sudafed (pseudoephedrine) & Actifed ** use only after [redacted] weeks gestation and if you do not have high blood pressure ?Tylenol ?Vicks Vaporub ?Zinc lozenges ?Zyrtec  ? ?Constipation: ?Colace ?Ducolax suppositories ?Fleet enema ?Glycerin suppositories ?Metamucil ?Milk of magnesia ?Miralax ?Senokot ?Smooth move tea ? ?Diarrhea: ?Kaopectate ?Imodium A-D ? ?*NO pepto Bismol ? ?Hemorrhoids: ?Anusol ?Anusol HC ?Preparation H ?Tucks ? ?Indigestion: ?Tums ?Maalox ?Mylanta ?Zantac  ?Pepcid ? ?Insomnia: ?Benadryl (alcohol free) 25mg every 6 hours as needed ?Tylenol PM ?Unisom, no Gelcaps ? ?Leg Cramps: ?Tums ?MagGel ? ?Nausea/Vomiting:  ?Bonine ?Dramamine ?Emetrol ?Ginger extract ?Sea bands ?Meclizine  ?Nausea medication to take during pregnancy:  ?Unisom (doxylamine succinate 25 mg tablets) Take one tablet daily at bedtime. If symptoms are not adequately controlled, the dose can be increased to a maximum recommended dose of two tablets daily (1/2 tablet in the morning, 1/2 tablet mid-afternoon and one at bedtime). ?Vitamin B6 100mg tablets. Take one tablet twice a day (up to 200 mg per day). ? ?Skin Rashes: ?Aveeno products ?Benadryl cream or 25mg every 6 hours as needed ?Calamine Lotion ?1% cortisone cream ? ?Yeast infection: ?Gyne-lotrimin 7 ?Monistat 7 ? ? ?**If taking multiple medications, please check labels to avoid duplicating the same active ingredients ?**take  medication as directed on the label ?** Do not exceed 4000 mg of tylenol in 24 hours ?**Do not take medications that contain aspirin or ibuprofen ? ? ? ?You have constipation which is hard stools that are difficult to pass. It is important to have regular bowel movements every 1-3 days that are soft and easy to pass. Hard stools increase your risk of hemorrhoids and are very uncomfortable.  ? ?To prevent constipation you can increase the amount of fiber in your diet. Examples of foods with fiber are leafy greens, whole grain breads, oatmeal and other grains.  It is also important to drink at least eight 8oz glass of water everyday.  ? ?If you have not has a bowel movement in 4-5 days you made need to clean out your bowel.  This will have establish normal movement through your bowel.   ? ?Miralax Clean out ?Take 8 capfuls of miralax in 64 oz of gatorade. You can use any fluid that appeals to you (gatorade, water, juice) ?Continue to drink at least eight 8 oz glasses of water throughout the day ?You can repeat with another 8 capfuls of miralax in 64 oz of gatorade if you are not having a large amount of stools ?You will need to be at home and close to a bathroom for about 8 hours when you do the above as you may need to go to the bathroom frequently.  ? ?After you are cleaned out: ?- Start Colace100mg twice daily ?- Start Miralax once daily ?- Start a daily fiber supplement like metamucil or citrucel ?- You can safely use enemas in pregnancy  ?- if you are having diarrhea you can reduce to Colace once a day or miralax every other   day or a 1/2 capful daily.  ? ?

## 2021-04-25 NOTE — MAU Note (Signed)
All day yesterday I had contractions that felt like tightening in upper and lower abd. I was fine this am and then for the last 2 hours contractions are back. Denies VB or LOF. States did not feel FM this am but tonight FM is normal

## 2021-04-25 NOTE — MAU Provider Note (Signed)
History     CSN: [id number]  Arrival date and time: 04/25/21 1910  None   Chief Complaint  Patient presents with   Abdominal Pain   HPI Shelby Obrien is a 30 y.o. [id number] at [redacted]w[redacted]d who presents to MAU for possible contractions. Patient reports she started having contractions yesterday that started out irregular and became more regular yesterday evening. This morning contractions had improved and she had just a few irregular ones, however this evening around 6pm they became more frequent and regular although she has not timed them. She increased her water intake and took a warm shower which helped relieve pain some. She denies vaginal bleeding, discharge or urinary s/s. She reports she has had some constipation, last BM was 2 days ago. She has not taken anything for constipation. Endorses active fetal movement. Receives [facility] at [facility].   OB History     Gravida  4   Para  2   Term  2   Preterm      AB  1   Living  2      SAB  1   IAB      Ectopic      Multiple  0   Live Births  2           Past Medical History:  Diagnosis Date   Asthma    Migraines    Obesity    BMI>40   Postpartum care following vaginal delivery 10/03/2017   Supervision of high risk pregnancy, antepartum, first trimester 02/26/2017   Clinic [facility] Prenatal Labs  Dating  LMP=7w [place] Blood type: A Pos  Genetic Screen 1 Screen: neg   AFP:     Quad:     NIPS: Antibody:   Anatomic [place]  complete, normal Rubella: Immune Varicella: Immune  GTT Early:  24             Third trimester: 101 RPR: NR  Rhogam Not applicable HBsAg: Negative  TDaP vaccine 08/07/17                       Flu Shot: HIV: NR  Baby Food  Breast                          Tachycardia, unspecified     Past Surgical History:  Procedure Laterality Date   NO PAST SURGERIES     WISDOM TOOTH EXTRACTION      Family History  Problem Relation Age of Onset   Cervical cancer Mother    Diabetes Maternal Grandmother    Heart  disease Maternal Grandmother    Diabetes Paternal Grandmother    Heart disease Paternal Grandmother    Diabetes Paternal Uncle    Diabetes Sister     Social History   Tobacco Use   Smoking status: Former    Packs/day: 0.00    Types: Cigarettes    Quit date: 01/02/2021    Years since quitting: 0.3   Smokeless tobacco: Never  Vaping Use   Vaping Use: Never used  Substance Use Topics   Alcohol use: No   Drug use: No    Allergies:  Allergies  Allergen Reactions   Shellfish Allergy Anaphylaxis   Banana    Blueberry Flavor    Other     BLUEBERRIES, BANANAS, TREE NUTS, RASPBERRIES, GRAPES, POLLENS/SEASONAL.   Raspberry     Medications Prior to Admission  Medication Sig Dispense Refill Last Dose  albuterol (VENTOLIN HFA) 108 (90 Base) MCG/ACT inhaler Inhale 2 puffs into the lungs every 6 (six) hours as needed for wheezing or shortness of breath. 8 g 0 04/24/2021   Prenatal Vit-Fe Fumarate-FA (PRENATAL MULTIVITAMIN) TABS tablet Take 1 tablet by mouth daily at 12 noon.   04/25/2021    Review of Systems  Constitutional: Negative.   Respiratory: Negative.    Cardiovascular: Negative.   Gastrointestinal:  Positive for abdominal pain and constipation. Negative for diarrhea, nausea and vomiting.  Genitourinary: Negative.   Musculoskeletal: Negative.   Neurological: Negative.   Physical Exam   Blood pressure 112/68, pulse 92, temperature 98.1 F (36.7 C), resp. rate 17, height 5\' 4"  (1.626 m), weight 120.7 kg, last menstrual period 11/24/2020, SpO2 99 %, unknown if currently breastfeeding.  Physical Exam Vitals and nursing note reviewed. Exam conducted with a chaperone present.  Constitutional:      General: She is not in acute distress.    Appearance: She is obese.  Eyes:     Extraocular Movements: Extraocular movements intact.     Pupils: Pupils are equal, round, and reactive to light.  Cardiovascular:     Rate and Rhythm: Normal rate.  Pulmonary:     Effort:  Pulmonary effort is normal.  Abdominal:     Palpations: Abdomen is soft.     Tenderness: There is no abdominal tenderness. There is no guarding.  Genitourinary:    Comments: Blind swabs collected Cervix closed/thick, large amount of stool palpated Musculoskeletal:        General: Normal range of motion.     Cervical back: Normal range of motion.  Skin:    General: Skin is warm and dry.  Neurological:     General: No focal deficit present.     Mental Status: She is alert and oriented to person, place, and time.  Psychiatric:        Mood and Affect: Mood normal.        Behavior: Behavior normal.        Thought Content: Thought content normal.        Judgment: Judgment normal.   FHT: 160 bpm via doppler Toco: quiet MAU Course  Procedures UA Wet prep, GC/CT collected Tylenol PO  MDM FHT's dopplered. UA unremarkable. Wet prep unremarkable, cervix closed/thick. Toco quiet. Tylenol PO given, pain improving at time of discharge.   Assessment and Plan  [redacted] weeks gestation of pregnancy Abdominal pain affecting pregnancy Constipation affecting pregnancy  - Discharge home in stable condition - List of safe meds in pregnancy provided. Recommend Miralax, stool softener, increased fiber and water intake for constipation - May use Tylenol prn - Keep OB appointment at [facility] as schedule   [facility], CNM 04/25/2021, 10:01 PM

## 2021-04-26 LAB — GC/CHLAMYDIA PROBE AMP (~~LOC~~) NOT AT ARMC
Chlamydia: NEGATIVE
Comment: NEGATIVE
Comment: NORMAL
Neisseria Gonorrhea: NEGATIVE

## 2021-04-28 NOTE — L&D Delivery Note (Signed)
OB/GYN Faculty Practice Delivery Note ? ?[address] Shelby Obrien is a 31 y.o. [phone] s/p SVD at [redacted]w[redacted]d. She was admitted for PPROM.  ? ?ROM: 8h 35m with clear fluid ?GBS Status: Negative, received PCN x1 ? ?Delivery Date/Time: 08/05/21 at 0913 ? ?Delivery: Called to room and patient was complete and pushing. Head delivered direct occiput anterior. No nuchal cord present. Shoulder and body delivered in usual fashion. Infant with spontaneous cry, placed on mother's abdomen, dried and stimulated. Cord clamped x 2 after 1-minute delay and cut by FOB under direct supervision. Cord blood drawn. Placenta delivered spontaneously with gentle cord traction. Fundus firm with massage and Pitocin. Labia, perineum, vagina, and cervix were inspected, and no lacerations were noted.  ? ?Placenta: Intact, 3VC - sent to L&D ?Complications: None  ?Lacerations: None  ?EBL: 50 cc ?Analgesia: Epidural  ? ?Infant: Viable female  APGARs 9 and 9 ? ?Vilma Meckel, MD ?OB/GYN Fellow, Faculty Practice  ? ? ?

## 2021-05-01 ENCOUNTER — Ambulatory Visit: Payer: BC Managed Care – PPO | Admitting: Cardiology

## 2021-05-08 ENCOUNTER — Other Ambulatory Visit: Payer: Self-pay

## 2021-05-08 ENCOUNTER — Ambulatory Visit: Payer: BC Managed Care – PPO | Admitting: *Deleted

## 2021-05-08 ENCOUNTER — Other Ambulatory Visit: Payer: Self-pay | Admitting: *Deleted

## 2021-05-08 ENCOUNTER — Ambulatory Visit: Payer: BC Managed Care – PPO | Attending: Obstetrics

## 2021-05-08 ENCOUNTER — Encounter: Payer: Self-pay | Admitting: *Deleted

## 2021-05-08 VITALS — BP 122/67 | HR 87

## 2021-05-08 DIAGNOSIS — O0990 Supervision of high risk pregnancy, unspecified, unspecified trimester: Secondary | ICD-10-CM

## 2021-05-08 DIAGNOSIS — Z362 Encounter for other antenatal screening follow-up: Secondary | ICD-10-CM | POA: Insufficient documentation

## 2021-05-08 DIAGNOSIS — O99212 Obesity complicating pregnancy, second trimester: Secondary | ICD-10-CM | POA: Diagnosis not present

## 2021-05-08 DIAGNOSIS — E669 Obesity, unspecified: Secondary | ICD-10-CM | POA: Diagnosis not present

## 2021-05-08 DIAGNOSIS — Z3A19 19 weeks gestation of pregnancy: Secondary | ICD-10-CM | POA: Diagnosis not present

## 2021-05-15 ENCOUNTER — Encounter: Payer: Self-pay | Admitting: Obstetrics & Gynecology

## 2021-05-15 ENCOUNTER — Ambulatory Visit (INDEPENDENT_AMBULATORY_CARE_PROVIDER_SITE_OTHER): Payer: BC Managed Care – PPO | Admitting: Obstetrics & Gynecology

## 2021-05-15 ENCOUNTER — Other Ambulatory Visit: Payer: Self-pay

## 2021-05-15 VITALS — BP 120/80 | Wt 267.0 lb

## 2021-05-15 DIAGNOSIS — Z131 Encounter for screening for diabetes mellitus: Secondary | ICD-10-CM

## 2021-05-15 DIAGNOSIS — Z3A24 24 weeks gestation of pregnancy: Secondary | ICD-10-CM

## 2021-05-15 DIAGNOSIS — O99210 Obesity complicating pregnancy, unspecified trimester: Secondary | ICD-10-CM

## 2021-05-15 DIAGNOSIS — Z6841 Body Mass Index (BMI) 40.0 and over, adult: Secondary | ICD-10-CM

## 2021-05-15 DIAGNOSIS — O0992 Supervision of high risk pregnancy, unspecified, second trimester: Secondary | ICD-10-CM

## 2021-05-15 NOTE — Patient Instructions (Signed)

## 2021-05-15 NOTE — Progress Notes (Signed)
°  Subjective  Fetal Movement? yes Contractions? Occas BHs, says they are brief and mild Leaking Fluid? no Vaginal Bleeding? no  [place] at MFM, has polyhydramnios and breech  Objective  BP 120/80    Wt 267 lb (121.1 kg)    LMP 11/24/2020    BMI 45.83 kg/m  General: NAD Pumonary: no increased work of breathing Abdomen: gravid, non-tender Extremities: no edema Psychiatric: mood appropriate, affect full  Assessment  30 y.o. [id number] at [redacted]w[redacted]d by  08/31/2021, by Last Menstrual Period presenting for routine prenatal visit  Plan   Problem List Items Addressed This Visit      Other   Maternal obesity affecting pregnancy, antepartum   BMI 40.0-44.9, adult ([facility])   Supervision of high risk pregnancy, antepartum - Primary  Other Visit Diagnoses    [redacted] weeks gestation of pregnancy       Screening for diabetes mellitus       Relevant Orders   28 Week RH+Panel    The following were addressed during this visit:  Breastfeeding Education - Early initiation of breastfeeding  - The importance of exclusive breastfeeding  - Risks of giving your baby anything other than breast milk if you are breastfeeding  - Nonpharmacological pain relief methods for labor  - The importance of early skin-to-skin contact  - Rooming-in on a 24-hour basis  - Feeding on demand or baby-led feeding  - Frequent feeding to help assure optimal milk production  - Effective positioning and attachment  - Exclusive breastfeeding for the first 6 months  - Individualized Education   22-27 weeks - Second Trimester Labs (RPR/Hgb-Hct/Plt/HIV)  - Fetal Growth and Movement   Plan monthly growth [place] at MFM Glucola nv  September 2022 Problems (from 12/27/20 to present)    Problem Noted Resolved   Supervision of high risk pregnancy, antepartum 01/17/2021 by Mirna Mires, CNM No   Overview Addendum 05/15/2021  9:35 AM by Nadara Mustard, MD     Nursing Staff Provider  Office Location  [facility] Dating  [place]  Language   English Anatomy US  MFM  Flu Vaccine  03/04/2021 Genetic Screen  NIPS: Normal XY  TDaP vaccine    Hgb A1C or  GTT Early :99 Third trimester :   Covid    A+  Rhogam  Not needed Blood Type A+  Feeding Plan Breast Antibody Negative (09/22 1557)neg  Contraception Unsure Rubella 4.28 (09/22 1557)immune  Circumcision  RPR Non Reactive (09/22 1557) neg  Pediatrician   HBsAg Negative (09/22 1557) neg  Support Person  HIV Non Reactive (09/22 1557)NR  Prenatal Classes  Varicella immune    GBS  (For PCN allergy, check sensitivities)   BTL Consent no  Hepatitis C negative  VBAC Consent n/a Pap  NILM    Hgb Electro      CF negative     SMA negative                 Annamarie Major, MD, [facility], [facility] 05/15/2021  9:35 AM

## 2021-05-20 ENCOUNTER — Other Ambulatory Visit: Payer: Self-pay

## 2021-05-20 ENCOUNTER — Encounter
Admission: RE | Admit: 2021-05-20 | Discharge: 2021-05-20 | Disposition: A | Discharge status: Home / Self Care | Payer: BC Managed Care – PPO | Source: Ambulatory Visit | Attending: Anesthesiology | Admitting: Anesthesiology

## 2021-05-20 NOTE — Consult Note (Signed)
Anesthesiology consult note ( Ambulatory referral to OB anesthesia for morbid obesity) :  Shelby Obrien, 33 y F,  has a history of morbid obesity with a BMI today of 45.83.  She is in her 2nd trimester. She is [id number].   Patient reports no previous problems with anesthesia, no history of significant back injuries besides chronic back pain without radiation. She has a MP score of 1.  We discussed the risks of Obesity in Pregnancy including but not limited to: Patients with a BMI > 40 are at increased risk for complications during pregnancy, such as hypertensive disorders of pregnancy, gestational diabetes, obstructive sleep apnea, thromboembolic disease, prolonged labor, operative vaginal delivery and need for cesarean delivery. There is an increased risk of airway complications, including inability to place a breathing tube, should an emergency cesarean delivery be required. Epidural placement is more difficult in obesity, often requires multiple attempts, more often results in inadequate pain relief, more often requires replacement due to movement of the epidural catheter and more often results in accidental dural puncture and post dural puncture headache.  I explained to the patient that we would follow our Obesity in Pregnancy Protocol: An anesthesiology consultation is recommended for all patients with a pre-pregnancy BMI ? 45. During this consultation the anesthesiologist will ask you questions about your medical history as well as do a physical exam that specifically looks for features that are predictive of complications of anesthesia.  The anesthesiologist will also review with you potential complications of anesthesia that are specifically increased due to obesity during pregnancy.  If your BMI ? 49 at your 34 week appointment you will have to be evaluated by an anesthesiologist prior to 36 weeks. This evaluation will specifically determine whether you are at high risk for complications of  anesthesia. If you are found to be at high risk for complications of anesthesia your OB provider will be directed to transfer your care to an OB provider at a hospital that has a higher maternal level of care designation  Patient voiced understanding and signed our Obesity in Pregnancy form acknowledging that we had discussed her pathway forward.

## 2021-06-04 ENCOUNTER — Other Ambulatory Visit: Payer: Self-pay

## 2021-06-04 ENCOUNTER — Ambulatory Visit (INDEPENDENT_AMBULATORY_CARE_PROVIDER_SITE_OTHER): Payer: BC Managed Care – PPO | Admitting: Obstetrics

## 2021-06-04 ENCOUNTER — Telehealth: Payer: Self-pay

## 2021-06-04 VITALS — BP 128/84 | Wt 269.0 lb

## 2021-06-04 DIAGNOSIS — O0990 Supervision of high risk pregnancy, unspecified, unspecified trimester: Secondary | ICD-10-CM

## 2021-06-04 DIAGNOSIS — Z3A27 27 weeks gestation of pregnancy: Secondary | ICD-10-CM

## 2021-06-04 NOTE — Telephone Encounter (Signed)
Patient transferred by front desk. 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 Reports having low back pain and braxton hicks ctx. Advised she is hydrated, has had 6 cups of water, however, she has not eaten since this morning d/t being busy. Braxton hicks were q30 mins then spread out to q hour but were more intense. Advised dizziness likely d/t lack of nutrition (needs to eat). Low back pain is not flank pain, patient describes as pain near spine. Advised likely d/t long period of standing. Advised can use belly band to help support/take weight off back. Patient admits to having difficulty urinating (denies pain with urination). Appointment scheduled for evaluation for UTI today at 4:15.

## 2021-06-04 NOTE — Progress Notes (Signed)
Work in [employer] pt, having a lot of cramping and braxton hicks since last night.

## 2021-06-04 NOTE — Progress Notes (Signed)
Routine Prenatal Care Visit  Subjective  Shelby Obrien is a 31 y.o. [phone] at [redacted]w[redacted]d being seen today for ongoing prenatal care.  She is currently monitored for the following issues for this high-risk pregnancy and has Migraines; Asthma; Maternal obesity affecting pregnancy, antepartum; BMI 40.0-44.9, adult ([place]); Nausea and vomiting in pregnancy; Anxiety and depression; Abdominal pain in pregnancy; Eczema; Supervision of high risk pregnancy, antepartum; and Rapid palpitations on their problem list.  ----------------------------------------------------------------------------------- Patient reports she thinks she may have a UTI. Worked in.   Contractions: Irritability. Vag. Bleeding: None.  Movement: Present. Leaking Fluid denies.  ----------------------------------------------------------------------------------- The following portions of the patient's history were reviewed and updated as appropriate: allergies, current medications, past family history, past medical history, past social history, past surgical history and problem list. Problem list updated.  Objective  Blood pressure 128/84, weight 269 lb (122 kg), last menstrual period 11/24/2020, unknown if currently breastfeeding. Pregravid weight 267 lb (121.1 kg) Total Weight Gain 2 lb (0.907 kg) Urinalysis: Urine Protein    Urine Glucose    Fetal Status:     Movement: Present     General:  Alert, oriented and cooperative. Patient is in no acute distress.  Skin: Skin is warm and dry. No rash noted.   Cardiovascular: Normal heart rate noted  Respiratory: Normal respiratory effort, no problems with respiration noted  Abdomen: Soft, gravid, appropriate for gestational age. Pain/Pressure: Present     Pelvic:  Cervical exam deferred        Extremities: Normal range of motion.     Mental Status: Normal mood and affect. Normal behavior. Normal judgment and thought content.   Assessment   31 y.o. [id number] at [redacted]w[redacted]d by  08/31/2021, by  Last Menstrual Period presenting for work-in prenatal visit  Plan   September 2022 Problems (from 12/27/20 to present)    Problem Noted Resolved   Supervision of high risk pregnancy, antepartum 01/17/2021 by Imagene Riches, CNM No   Overview Addendum 05/15/2021  9:35 AM by Gae Dry, MD     Nursing Staff Provider  Office Location  [facility] Dating  [place]  Language  English Anatomy US  MFM  Flu Vaccine  03/04/2021 Genetic Screen  NIPS: Normal XY  TDaP vaccine    Hgb A1C or  GTT Early :99 Third trimester :   Covid    A+  Rhogam  Not needed Blood Type A+  Feeding Plan Breast Antibody Negative (09/22 1557)neg  Contraception Unsure Rubella 4.28 (09/22 1557)immune  Circumcision  RPR Non Reactive (09/22 1557) neg  Pediatrician   HBsAg Negative (09/22 1557) neg  Support Person  HIV Non Reactive (09/22 1557)NR  Prenatal Classes  Varicella immune    GBS  (For PCN allergy, check sensitivities)   BTL Consent no  Hepatitis C negative  VBAC Consent n/a Pap  NILM    Hgb Electro      CF negative     SMA negative                 Preterm labor symptoms and general obstetric precautions including but not limited to vaginal bleeding, contractions, leaking of fluid and fetal movement were reviewed in detail with the patient. Please refer to After Visit Summary for other counseling recommendations.  Her urine sample is "clean". Reassured. Advice for better support for her back pain provided, including a maternity belt, tylenol at night, heating pad or cool packs.Her work shoes are not supportive iether- adivsed to get an insert or get  running shoes for work.   No follow-ups on file.  Imagene Riches, CNM  06/04/2021 4:29 PM

## 2021-06-07 ENCOUNTER — Other Ambulatory Visit: Payer: Self-pay

## 2021-06-07 ENCOUNTER — Ambulatory Visit: Payer: BC Managed Care – PPO | Attending: Obstetrics

## 2021-06-07 ENCOUNTER — Ambulatory Visit: Payer: BC Managed Care – PPO | Admitting: *Deleted

## 2021-06-07 ENCOUNTER — Encounter: Payer: Self-pay | Admitting: *Deleted

## 2021-06-07 VITALS — BP 119/69 | HR 90

## 2021-06-07 DIAGNOSIS — E669 Obesity, unspecified: Secondary | ICD-10-CM

## 2021-06-07 DIAGNOSIS — O0990 Supervision of high risk pregnancy, unspecified, unspecified trimester: Secondary | ICD-10-CM

## 2021-06-07 DIAGNOSIS — O402XX0 Polyhydramnios, second trimester, not applicable or unspecified: Secondary | ICD-10-CM

## 2021-06-07 DIAGNOSIS — O99212 Obesity complicating pregnancy, second trimester: Secondary | ICD-10-CM | POA: Insufficient documentation

## 2021-06-07 DIAGNOSIS — Z3A27 27 weeks gestation of pregnancy: Secondary | ICD-10-CM | POA: Diagnosis not present

## 2021-06-10 ENCOUNTER — Other Ambulatory Visit: Payer: Self-pay | Admitting: *Deleted

## 2021-06-10 DIAGNOSIS — Z3689 Encounter for other specified antenatal screening: Secondary | ICD-10-CM

## 2021-06-10 DIAGNOSIS — O409XX0 Polyhydramnios, unspecified trimester, not applicable or unspecified: Secondary | ICD-10-CM

## 2021-06-10 DIAGNOSIS — R638 Other symptoms and signs concerning food and fluid intake: Secondary | ICD-10-CM

## 2021-06-14 ENCOUNTER — Other Ambulatory Visit: Payer: BC Managed Care – PPO

## 2021-06-14 ENCOUNTER — Ambulatory Visit (INDEPENDENT_AMBULATORY_CARE_PROVIDER_SITE_OTHER): Payer: BC Managed Care – PPO | Admitting: Licensed Practical Nurse

## 2021-06-14 ENCOUNTER — Encounter: Payer: Self-pay | Admitting: Licensed Practical Nurse

## 2021-06-14 ENCOUNTER — Other Ambulatory Visit: Payer: Self-pay

## 2021-06-14 VITALS — BP 120/72 | Wt 270.0 lb

## 2021-06-14 DIAGNOSIS — L299 Pruritus, unspecified: Secondary | ICD-10-CM | POA: Diagnosis not present

## 2021-06-14 DIAGNOSIS — Z3A28 28 weeks gestation of pregnancy: Secondary | ICD-10-CM

## 2021-06-14 DIAGNOSIS — Z131 Encounter for screening for diabetes mellitus: Secondary | ICD-10-CM

## 2021-06-14 DIAGNOSIS — O0990 Supervision of high risk pregnancy, unspecified, unspecified trimester: Secondary | ICD-10-CM | POA: Diagnosis not present

## 2021-06-14 LAB — POCT URINALYSIS DIPSTICK OB
Glucose, UA: NEGATIVE
POC,PROTEIN,UA: NEGATIVE

## 2021-06-14 NOTE — Progress Notes (Signed)
Routine Prenatal Care Visit  Subjective  Shelby Obrien is a 31 y.o. [phone] at [redacted]w[redacted]d being seen today for ongoing prenatal care.  She is currently monitored for the following issues for this high-risk pregnancy and has Migraines; Asthma; Maternal obesity affecting pregnancy, antepartum; BMI 40.0-44.9, adult ([place]); Nausea and vomiting in pregnancy; Anxiety and depression; Abdominal pain in pregnancy; Eczema; Supervision of high risk pregnancy, antepartum; and Rapid palpitations on their problem list.  ----------------------------------------------------------------------------------- Patient reports  itching under her breasts, top of abdomen, and along her back x 1 week, it is worse at night, has used vaseline with some relief .   Doing well, active fetus, mood is good.  Trying to coordinate end of work schedule, she tends to go into labor before her due date, was rec to have 39 wk IOL, rec planning to stop work in the 32th week.  Contractions: Irritability. Vag. Bleeding: None.  Movement: Present. Leaking Fluid denies.  ----------------------------------------------------------------------------------- The following portions of the patient's history were reviewed and updated as appropriate: allergies, current medications, past family history, past medical history, past social history, past surgical history and problem list. Problem list updated.  Objective  Blood pressure 120/72, weight 270 lb (122.5 kg), last menstrual period 11/24/2020, unknown if currently breastfeeding. Pregravid weight 267 lb (121.1 kg) Total Weight Gain 3 lb (1.361 kg) Urinalysis: Urine Protein    Urine Glucose    Fetal Status: Fetal Heart Rate (bpm): 140 Fundal Height: 37 cm Movement: Present     Red pink with raised areas under both breasts, looks like heat rash Works at [employer] at [employer]:  Alert, oriented and cooperative. Patient is in no acute distress.  Skin: Skin is warm and dry. No rash  noted.   Cardiovascular: Normal heart rate noted  Respiratory: Normal respiratory effort, no problems with respiration noted  Abdomen: Soft, gravid, appropriate for gestational age. Pain/Pressure: Present     Pelvic:  Cervical exam deferred        Extremities: Normal range of motion.  Edema: Trace  Mental Status: Normal mood and affect. Normal behavior. Normal judgment and thought content.   Assessment   31 y.o. [id number] at [redacted]w[redacted]d by  08/31/2021, by Last Menstrual Period presenting for routine prenatal visit  Plan   September 2022 Problems (from 12/27/20 to present)     Problem Noted Resolved   Supervision of high risk pregnancy, antepartum 01/17/2021 by Imagene Riches, CNM No   Overview Addendum 05/15/2021  9:35 AM by Gae Dry, MD     Nursing Staff Provider  Office Location  [facility] Dating  [place]  Language  English Anatomy US  MFM  Flu Vaccine  03/04/2021 Genetic Screen  NIPS: Normal XY  TDaP vaccine    Hgb A1C or  GTT Early :99 Third trimester :   Covid    A+  Rhogam  Not needed Blood Type A+  Feeding Plan Breast Antibody Negative (09/22 1557)neg  Contraception Unsure Rubella 4.28 (09/22 1557)immune  Circumcision  RPR Non Reactive (09/22 1557) neg  Pediatrician   HBsAg Negative (09/22 1557) neg  Support Person  HIV Non Reactive (09/22 1557)NR  Prenatal Classes  Varicella immune    GBS  (For PCN allergy, check sensitivities)   BTL Consent no  Hepatitis C negative  VBAC Consent n/a Pap  NILM    Hgb Electro      CF negative     SMA negative  Preterm labor symptoms and general obstetric precautions including but not limited to vaginal bleeding, contractions, leaking of fluid and fetal movement were reviewed in detail with the patient. Please refer to After Visit Summary for other counseling recommendations.   Return in about 2 weeks (around 06/28/2021) for ROB.  28 wk plus cholestasis labs Has FU with MFM scheduled Rec hydrocortisone, prickly  heat, and anti-histamines for itching. Change clothing and go braless once home from work   [facility] [place]  06/14/21  3:25 PM

## 2021-06-14 NOTE — Addendum Note (Signed)
Addended by: Burtis Junes on: 06/14/2021 03:29 PM   Modules accepted: Orders

## 2021-06-15 ENCOUNTER — Other Ambulatory Visit: Payer: Self-pay | Admitting: Obstetrics & Gynecology

## 2021-06-15 DIAGNOSIS — O99013 Anemia complicating pregnancy, third trimester: Secondary | ICD-10-CM

## 2021-06-15 LAB — 28 WEEK RH+PANEL
Basophils Absolute: 0 10*3/uL (ref 0.0–0.2)
Basos: 0 %
EOS (ABSOLUTE): 0.1 10*3/uL (ref 0.0–0.4)
Eos: 2 %
Gestational Diabetes Screen: 135 mg/dL (ref 70–139)
HIV Screen 4th Generation wRfx: NONREACTIVE
Hematocrit: 25.8 % — ABNORMAL LOW (ref 34.0–46.6)
Hemoglobin: 8.3 g/dL — ABNORMAL LOW (ref 11.1–15.9)
Immature Grans (Abs): 0 10*3/uL (ref 0.0–0.1)
Immature Granulocytes: 1 %
Lymphocytes Absolute: 1.3 10*3/uL (ref 0.7–3.1)
Lymphs: 19 %
MCH: 25.8 pg — ABNORMAL LOW (ref 26.6–33.0)
MCHC: 32.2 g/dL (ref 31.5–35.7)
MCV: 80 fL (ref 79–97)
Monocytes Absolute: 0.4 10*3/uL (ref 0.1–0.9)
Monocytes: 6 %
Neutrophils Absolute: 4.9 10*3/uL (ref 1.4–7.0)
Neutrophils: 72 %
Platelets: 215 10*3/uL (ref 150–450)
RBC: 3.22 x10E6/uL — ABNORMAL LOW (ref 3.77–5.28)
RDW: 15.8 % — ABNORMAL HIGH (ref 11.7–15.4)
RPR Ser Ql: NONREACTIVE
WBC: 6.7 10*3/uL (ref 3.4–10.8)

## 2021-06-15 NOTE — Progress Notes (Signed)
Referral to hematology for severe anemia third trimester

## 2021-06-16 LAB — COMPREHENSIVE METABOLIC PANEL
ALT: 8 IU/L (ref 0–32)
AST: 13 IU/L (ref 0–40)
Albumin/Globulin Ratio: 1.3 (ref 1.2–2.2)
Albumin: 3.2 g/dL — ABNORMAL LOW (ref 3.8–4.8)
Alkaline Phosphatase: 92 IU/L (ref 44–121)
BUN/Creatinine Ratio: 14 (ref 9–23)
BUN: 9 mg/dL (ref 6–20)
Bilirubin Total: 0.4 mg/dL (ref 0.0–1.2)
CO2: 21 mmol/L (ref 20–29)
Calcium: 8.5 mg/dL — ABNORMAL LOW (ref 8.7–10.2)
Chloride: 105 mmol/L (ref 96–106)
Creatinine, Ser: 0.66 mg/dL (ref 0.57–1.00)
Globulin, Total: 2.4 g/dL (ref 1.5–4.5)
Glucose: 125 mg/dL — ABNORMAL HIGH (ref 70–99)
Potassium: 3.7 mmol/L (ref 3.5–5.2)
Sodium: 136 mmol/L (ref 134–144)
Total Protein: 5.6 g/dL — ABNORMAL LOW (ref 6.0–8.5)
eGFR: 119 mL/min/{1.73_m2} (ref >59)

## 2021-06-16 LAB — BILIRUBIN, FRACTIONATED(TOT/DIR/INDIR)
Bilirubin, Direct: 0.11 mg/dL (ref 0.00–0.40)
Bilirubin, Indirect: 0.29 mg/dL (ref 0.10–0.80)

## 2021-06-16 LAB — BILE ACIDS, TOTAL: Bile Acids Total: 1.7 umol/L (ref 0.0–10.0)

## 2021-06-20 ENCOUNTER — Encounter: Payer: Self-pay | Admitting: *Deleted

## 2021-06-25 ENCOUNTER — Inpatient Hospital Stay: Payer: BC Managed Care – PPO | Admitting: Internal Medicine

## 2021-06-25 ENCOUNTER — Inpatient Hospital Stay: Payer: BC Managed Care – PPO

## 2021-07-01 ENCOUNTER — Telehealth: Payer: Self-pay

## 2021-07-01 NOTE — Telephone Encounter (Signed)
New message   [facility] has received your information, and the request will be reviewed. You may close this dialog, return to your dashboard, and perform other tasks.  You will receive an electronic determination in [employer]eds. You can see the latest determination by locating this request on your dashboard or by reopening this request. You will also receive a faxed copy of the determination. If you have any questions please contact [facility] at [phone].  If you need assistance, please chat with CoverMy[employer]eds or call us at [phone].  Shelby Obrien (Key: [id number]) Tosymra 10MG /ACT Nasal Spray   Form PA Form (2017 NCPDP) Created 1 day ago Sent to Plan 7 minutes ago Plan Response 7 minutes ago Submit Clinical Questions less than a minute ago Determination Wait for Determination Please wait for [facility] Comm NCPDP 2017 to return a determination.

## 2021-07-02 ENCOUNTER — Encounter: Payer: Self-pay | Admitting: Advanced Practice Midwife

## 2021-07-02 ENCOUNTER — Ambulatory Visit (INDEPENDENT_AMBULATORY_CARE_PROVIDER_SITE_OTHER): Payer: BC Managed Care – PPO | Admitting: Advanced Practice Midwife

## 2021-07-02 ENCOUNTER — Encounter: Payer: BC Managed Care – PPO | Admitting: Licensed Practical Nurse

## 2021-07-02 ENCOUNTER — Other Ambulatory Visit: Payer: Self-pay

## 2021-07-02 VITALS — BP 120/80 | Wt 267.0 lb

## 2021-07-02 DIAGNOSIS — Z23 Encounter for immunization: Secondary | ICD-10-CM | POA: Diagnosis not present

## 2021-07-02 DIAGNOSIS — O99213 Obesity complicating pregnancy, third trimester: Secondary | ICD-10-CM

## 2021-07-02 DIAGNOSIS — O0993 Supervision of high risk pregnancy, unspecified, third trimester: Secondary | ICD-10-CM

## 2021-07-02 DIAGNOSIS — Z3A31 31 weeks gestation of pregnancy: Secondary | ICD-10-CM

## 2021-07-02 NOTE — Progress Notes (Signed)
Routine Prenatal Care Visit ? ?Subjective  ?Shelby Obrien is a 31 y.o. [phone] at [redacted]w[redacted]d being seen today for ongoing prenatal care.  She is currently monitored for the following issues for this high-risk pregnancy and has Migraines; Asthma; Maternal obesity affecting pregnancy, antepartum; BMI 40.0-44.9, adult (HCC); Nausea and vomiting in pregnancy; Anxiety and depression; Abdominal pain in pregnancy; Eczema; Supervision of high risk pregnancy, antepartum; and Rapid palpitations on their problem list.  ?----------------------------------------------------------------------------------- ?Patient reports a couple episodes of spotting with wiping in the past 2 weeks she thinks due to overworking recently. She has pelvic pressure.   ?Contractions: Not present. Vag. Bleeding: Scant.  Movement: Present. Leaking Fluid denies.  ?----------------------------------------------------------------------------------- ?The following portions of the patient's history were reviewed and updated as appropriate: allergies, current medications, past family history, past medical history, past social history, past surgical history and problem list. Problem list updated. ? ?Objective  ?Blood pressure 120/80, weight 267 lb (121.1 kg), last menstrual period 11/24/2020 ?Pregravid weight 267 lb (121.1 kg) Total Weight Gain 0 lb (0 kg) ?Urinalysis: Urine Protein    Urine Glucose   ? ?Fetal Status: Fetal Heart Rate (bpm): 138 Fundal Height: 36 cm Movement: Present    ? ?General:  Alert, oriented and cooperative. Patient is in no acute distress.  ?Skin: Skin is warm and dry. No rash noted.   ?Cardiovascular: Normal heart rate noted  ?Respiratory: Normal respiratory effort, no problems with respiration noted  ?Abdomen: Soft, gravid, appropriate for gestational age. Pain/Pressure: Present     ?Pelvic:  Cervical exam deferred        ?Extremities: Normal range of motion.  Edema: None  ?Mental Status: Normal mood and affect. Normal  behavior. Normal judgment and thought content.  ? ?Assessment  ? ?31 y.o. [id number] at [redacted]w[redacted]d by  08/31/2021, by Last Menstrual Period presenting for routine prenatal visit ? ?Plan  ? ?September 2022 Problems (from 12/27/20 to present)   ? Problem Noted Resolved  ? Supervision of high risk pregnancy, antepartum 01/17/2021 by Mirna Mires, CNM No  ? Overview Addendum 05/15/2021  9:35 AM by Nadara Mustard, MD  ?   ?Nursing Staff Provider  ?Office Location  [facility] Dating  [place]  ?Language  English Anatomy US  MFM  ?Flu Vaccine  03/04/2021 Genetic Screen  NIPS: Normal XY  ?TDaP vaccine   07/02/21 Hgb A1C or  ?GTT Early :99 ?Third trimester :   ?Covid    A+  ?Rhogam  Not needed Blood Type A+  ?Feeding Plan Breast Antibody Negative (09/22 1557)neg  ?Contraception Unsure Rubella 4.28 (09/22 1557)immune  ?Circumcision  RPR Non Reactive (09/22 1557) neg  ?Pediatrician   HBsAg Negative (09/22 1557) neg  ?Support Person  HIV Non Reactive (09/22 1557)NR  ?Prenatal Classes  Varicella immune  ?  GBS  (For PCN allergy, check sensitivities)   ?BTL Consent no  Hepatitis C negative  ?VBAC Consent n/a Pap  NILM  ?  Hgb Electro    ?  CF negative  ?   SMA negative  ?     ? ?  ?  ?  ?  ? ?Preterm labor symptoms and general obstetric precautions including but not limited to vaginal bleeding, contractions, leaking of fluid and fetal movement were reviewed in detail with the patient. ? ? ?Return in about 2 weeks (around 07/16/2021) for rob. ? ?Tresea Mall, CNM ?07/02/2021 2:33 PM   ? ?

## 2021-07-02 NOTE — Telephone Encounter (Signed)
F/u  Approved medication Tosymra 10 mg  Effective date  3.7.23  to  3.7.24

## 2021-07-03 ENCOUNTER — Other Ambulatory Visit: Payer: Self-pay | Admitting: *Deleted

## 2021-07-03 ENCOUNTER — Ambulatory Visit: Payer: BC Managed Care – PPO | Admitting: *Deleted

## 2021-07-03 ENCOUNTER — Ambulatory Visit: Payer: BC Managed Care – PPO | Attending: Obstetrics and Gynecology

## 2021-07-03 VITALS — BP 108/63 | HR 97

## 2021-07-03 DIAGNOSIS — O358XX0 Maternal care for other (suspected) fetal abnormality and damage, not applicable or unspecified: Secondary | ICD-10-CM

## 2021-07-03 DIAGNOSIS — O0990 Supervision of high risk pregnancy, unspecified, unspecified trimester: Secondary | ICD-10-CM | POA: Diagnosis not present

## 2021-07-03 DIAGNOSIS — O3663X0 Maternal care for excessive fetal growth, third trimester, not applicable or unspecified: Secondary | ICD-10-CM | POA: Diagnosis not present

## 2021-07-03 DIAGNOSIS — O99513 Diseases of the respiratory system complicating pregnancy, third trimester: Secondary | ICD-10-CM

## 2021-07-03 DIAGNOSIS — O409XX0 Polyhydramnios, unspecified trimester, not applicable or unspecified: Secondary | ICD-10-CM | POA: Insufficient documentation

## 2021-07-03 DIAGNOSIS — O99213 Obesity complicating pregnancy, third trimester: Secondary | ICD-10-CM

## 2021-07-03 DIAGNOSIS — J45909 Unspecified asthma, uncomplicated: Secondary | ICD-10-CM | POA: Diagnosis not present

## 2021-07-03 DIAGNOSIS — Z3689 Encounter for other specified antenatal screening: Secondary | ICD-10-CM | POA: Diagnosis not present

## 2021-07-03 DIAGNOSIS — R638 Other symptoms and signs concerning food and fluid intake: Secondary | ICD-10-CM | POA: Diagnosis not present

## 2021-07-03 DIAGNOSIS — O403XX0 Polyhydramnios, third trimester, not applicable or unspecified: Secondary | ICD-10-CM

## 2021-07-03 DIAGNOSIS — Z3A31 31 weeks gestation of pregnancy: Secondary | ICD-10-CM

## 2021-07-03 DIAGNOSIS — Z362 Encounter for other antenatal screening follow-up: Secondary | ICD-10-CM

## 2021-07-05 ENCOUNTER — Encounter: Payer: Self-pay | Admitting: Internal Medicine

## 2021-07-05 ENCOUNTER — Inpatient Hospital Stay: Payer: BC Managed Care – PPO

## 2021-07-05 ENCOUNTER — Other Ambulatory Visit: Payer: Self-pay

## 2021-07-05 ENCOUNTER — Inpatient Hospital Stay: Payer: BC Managed Care – PPO | Attending: Internal Medicine | Admitting: Internal Medicine

## 2021-07-05 VITALS — BP 125/77

## 2021-07-05 DIAGNOSIS — D649 Anemia, unspecified: Secondary | ICD-10-CM | POA: Insufficient documentation

## 2021-07-05 DIAGNOSIS — Z3A31 31 weeks gestation of pregnancy: Secondary | ICD-10-CM | POA: Diagnosis not present

## 2021-07-05 DIAGNOSIS — R000 Tachycardia, unspecified: Secondary | ICD-10-CM | POA: Diagnosis not present

## 2021-07-05 DIAGNOSIS — Z79899 Other long term (current) drug therapy: Secondary | ICD-10-CM | POA: Diagnosis not present

## 2021-07-05 DIAGNOSIS — Z808 Family history of malignant neoplasm of other organs or systems: Secondary | ICD-10-CM | POA: Insufficient documentation

## 2021-07-05 DIAGNOSIS — Z87891 Personal history of nicotine dependence: Secondary | ICD-10-CM

## 2021-07-05 DIAGNOSIS — I959 Hypotension, unspecified: Secondary | ICD-10-CM | POA: Diagnosis not present

## 2021-07-05 LAB — COMPREHENSIVE METABOLIC PANEL
ALT: 11 U/L (ref 0–44)
AST: 18 U/L (ref 15–41)
Albumin: 2.5 g/dL — ABNORMAL LOW (ref 3.5–5.0)
Alkaline Phosphatase: 83 U/L (ref 38–126)
Anion gap: 7 (ref 5–15)
BUN: 9 mg/dL (ref 6–20)
CO2: 21 mmol/L — ABNORMAL LOW (ref 22–32)
Calcium: 8 mg/dL — ABNORMAL LOW (ref 8.9–10.3)
Chloride: 104 mmol/L (ref 98–111)
Creatinine, Ser: 0.56 mg/dL (ref 0.44–1.00)
GFR, Estimated: >60 mL/min (ref >60)
Glucose, Bld: 79 mg/dL (ref 70–99)
Potassium: 3.6 mmol/L (ref 3.5–5.1)
Sodium: 132 mmol/L — ABNORMAL LOW (ref 135–145)
Total Bilirubin: 0.3 mg/dL (ref 0.3–1.2)
Total Protein: 6 g/dL — ABNORMAL LOW (ref 6.5–8.1)

## 2021-07-05 LAB — RETICULOCYTES
Immature Retic Fract: 24.2 % — ABNORMAL HIGH (ref 2.3–15.9)
RBC.: 3.27 MIL/uL — ABNORMAL LOW (ref 3.87–5.11)
Retic Count, Absolute: 58.2 10*3/uL (ref 19.0–186.0)
Retic Ct Pct: 1.8 % (ref 0.4–3.1)

## 2021-07-05 LAB — IRON AND TIBC
Iron: 30 ug/dL (ref 28–170)
Saturation Ratios: 5 % — ABNORMAL LOW (ref 10.4–31.8)
TIBC: 567 ug/dL — ABNORMAL HIGH (ref 250–450)
UIBC: 537 ug/dL

## 2021-07-05 LAB — CBC WITH DIFFERENTIAL/PLATELET
Abs Immature Granulocytes: 0.05 10*3/uL (ref 0.00–0.07)
Basophils Absolute: 0 10*3/uL (ref 0.0–0.1)
Basophils Relative: 0 %
Eosinophils Absolute: 0.1 10*3/uL (ref 0.0–0.5)
Eosinophils Relative: 1 %
HCT: 26.6 % — ABNORMAL LOW (ref 36.0–46.0)
Hemoglobin: 8.4 g/dL — ABNORMAL LOW (ref 12.0–15.0)
Immature Granulocytes: 1 %
Lymphocytes Relative: 18 %
Lymphs Abs: 1.4 10*3/uL (ref 0.7–4.0)
MCH: 25.9 pg — ABNORMAL LOW (ref 26.0–34.0)
MCHC: 31.6 g/dL (ref 30.0–36.0)
MCV: 82.1 fL (ref 80.0–100.0)
Monocytes Absolute: 0.5 10*3/uL (ref 0.1–1.0)
Monocytes Relative: 7 %
Neutro Abs: 5.7 10*3/uL (ref 1.7–7.7)
Neutrophils Relative %: 73 %
Platelets: 187 10*3/uL (ref 150–400)
RBC: 3.24 MIL/uL — ABNORMAL LOW (ref 3.87–5.11)
RDW: 17 % — ABNORMAL HIGH (ref 11.5–15.5)
WBC: 7.7 10*3/uL (ref 4.0–10.5)
nRBC: 0 % (ref 0.0–0.2)

## 2021-07-05 LAB — LACTATE DEHYDROGENASE: LDH: 188 U/L (ref 98–192)

## 2021-07-05 LAB — TECHNOLOGIST SMEAR REVIEW
Plt Morphology: NORMAL
RBC MORPHOLOGY: NORMAL
WBC MORPHOLOGY: NORMAL

## 2021-07-05 LAB — FOLATE: Folate: 9.9 ng/mL (ref >5.9)

## 2021-07-05 LAB — VITAMIN B12: Vitamin B-12: 360 pg/mL (ref 180–914)

## 2021-07-05 LAB — FERRITIN: Ferritin: 4 ng/mL — ABNORMAL LOW (ref 11–307)

## 2021-07-05 LAB — ABO/RH: ABO/RH(D): A POS

## 2021-07-05 MED ORDER — SODIUM CHLORIDE 0.9 % IV SOLN
Freq: Once | INTRAVENOUS | Status: AC
  Filled 2021-07-05: qty 250

## 2021-07-05 NOTE — Assessment & Plan Note (Addendum)
#  Anemia-symptomatic hemoglobin 8-suspect iron deficiency/pregnancy [third trimester].  Recommend IV iron infusion weekly x4. Discussed the potential acute infusion reactions with IV iron; which are quite rare.  Patient understands the risk; will proceed with infusions.  ? ?# Recommend CBC CMP LDH peripheral smear; haptoglobin;  iron studies ferritin B12 folic acid; reticulocyte count.  In case patient needed a blood transfusion check ABO/Rh; hold tube.  Review of smear. ? ?#Tachycardia/hypotension: Initially tachycardic at 128; blood pressure 80s systolic-likely secondary to poor p.o. intake.  Recommend increase fluid intake patient is here 1 L IV fluids.  Blood pressure and heart rate improved. ? ?Thank you Dr.Harris for allowing me to participate in the care of your pleasant patient. Please do not hesitate to contact me with questions or concerns in the interim. ? ?# DISPOSITION: ?# labs today-  cbc/cmp/iron studies/ferritin; LDH ?# IVFs 1 lits over 1 hour ?# venofer weekly x4 ?# follow up with NP in 6 weeks- labs- cbc/possible Venofer-Dr.B ?

## 2021-07-05 NOTE — Progress Notes (Signed)
[place] NOTE  Patient Care Team: Laurey Morale, MD as PCP - General (Family Medicine) Leonie Man, MD as PCP - Cardiology (Cardiology) Pieter Partridge, DO as Consulting Physician (Neurology) Cammie Sickle, MD as Consulting Physician (Hematology and Oncology)  CHIEF COMPLAINTS/PURPOSE OF CONSULTATION: ANEMIA   HEMATOLOGY HISTORY  #Anemia-likely iron deficiency hemoglobin 8/ [third trimester; EDD on may 6th, 2023]  HISTORY OF PRESENTING ILLNESS:  Shelby Obrien 31 y.o.  female pleasant patient currently third-trimester pregnancy  been referred to [place] for further evaluation of anemia.  Patient is currently [redacted] weeks pregnant; expected date of delivery on May 6th..  This is patient's third pregnancy.  Patient complains of dizziness.  Complains of palpitations.  Blood in stools:none Blood in urine:none Difficulty swallowing: none Change of bowel movement/constipation:none Prior blood transfusion:none Liver disease: none Alcohol: none Bariatric surgery:none  Vaginal bleeding: none/pregnant.  Oral iron: none Prior IV iron infusions:none   Review of Systems  Constitutional:  Positive for malaise/fatigue. Negative for chills, diaphoresis, fever and weight loss.  HENT:  Negative for nosebleeds and sore throat.   Eyes:  Negative for double vision.  Respiratory:  Negative for cough, hemoptysis, sputum production, shortness of breath and wheezing.   Cardiovascular:  Positive for palpitations. Negative for chest pain, orthopnea and leg swelling.  Gastrointestinal:  Negative for abdominal pain, blood in stool, constipation, diarrhea, heartburn, melena, nausea and vomiting.  Genitourinary:  Negative for dysuria, frequency and urgency.  Musculoskeletal:  Negative for back pain and joint pain.  Skin: Negative.  Negative for itching and rash.  Neurological:  Positive for dizziness. Negative for tingling, focal weakness, weakness and headaches.   Endo/Heme/Allergies:  Does not bruise/bleed easily.  Psychiatric/Behavioral:  Negative for depression. The patient is not nervous/anxious and does not have insomnia.     MEDICAL HISTORY:  Past Medical History:  Diagnosis Date   Anemia affecting pregnancy in third trimester    Asthma    Migraines    Obesity    BMI>40   Postpartum care following vaginal delivery 10/03/2017   Supervision of high risk pregnancy, antepartum, first trimester 02/26/2017   Clinic [facility] Prenatal Labs  Dating  LMP=7w [place] Blood type: A Pos  Genetic Screen 1 Screen: neg   AFP:     Quad:     NIPS: Antibody:   Anatomic [place]  complete, normal Rubella: Immune Varicella: Immune  GTT Early:  39             Third trimester: 101 RPR: NR  Rhogam Not applicable HBsAg: Negative  TDaP vaccine 08/07/17                       Flu Shot: HIV: NR  Baby Food  Breast                          Tachycardia, unspecified     SURGICAL HISTORY: Past Surgical History:  Procedure Laterality Date   NO PAST SURGERIES     WISDOM TOOTH EXTRACTION      SOCIAL HISTORY: Social History   Socioeconomic History   Marital status: Single    Spouse name: Not on file   Number of children: 2   Years of education: 16   Highest education level: Bachelor's degree (e.g., BA, AB, BS)  Occupational History   Occupation: Airline pilot: [employer]  Tobacco Use   Smoking status: Former  Packs/day: 0.00    Types: Cigarettes    Quit date: 01/02/2021    Years since quitting: 0.5   Smokeless tobacco: Never  Vaping Use   Vaping Use: Never used  Substance and Sexual Activity   Alcohol use: No   Drug use: No   Sexual activity: Yes    Birth control/protection: None  Other Topics Concern   Not on file  Social History Narrative   Lives with boyfriend and 2 daughters in an apartment on the 2nd floor.  Works as a Training and development officer at [employer].  Education: college. She is right-handed. Caffeine - one glass every few weeks. She is active with her children and  work. No smoking.    Social Determinants of Health   Financial Resource Strain: Not on file  Food Insecurity: Not on file  Transportation Needs: Not on file  Physical Activity: Not on file  Stress: Not on file  Social Connections: Not on file  Intimate Partner Violence: Not on file    FAMILY HISTORY: Family History  Problem Relation Age of Onset   Cervical cancer Mother    Diabetes Sister    Diabetes Paternal Uncle    Diabetes Maternal Grandmother    Heart disease Maternal Grandmother    Diabetes Paternal Grandmother    Heart disease Paternal Grandmother     ALLERGIES:  is allergic to shellfish allergy, banana, blueberry flavor, other, and raspberry.  MEDICATIONS:  Current Outpatient Medications  Medication Sig Dispense Refill   albuterol (VENTOLIN HFA) 108 (90 Base) MCG/ACT inhaler Inhale 2 puffs into the lungs every 6 (six) hours as needed for wheezing or shortness of breath. 8 g 0   Prenatal Vit-Fe Fumarate-FA (PRENATAL MULTIVITAMIN) TABS tablet Take 1 tablet by mouth daily at 12 noon.     No current facility-administered medications for this visit.     Marland Kitchen  PHYSICAL EXAMINATION:   Vitals:   07/05/21 1415 07/05/21 1437  BP: (!) 83/44 121/74  Pulse: (!) 123 (!) 103  Temp: (!) 97.3 F (36.3 C)   SpO2: 100% 97%   Filed Weights   07/05/21 1415  Weight: 269 lb 3.2 oz (122.1 kg)    Physical Exam Vitals and nursing note reviewed.  HENT:     Head: Normocephalic and atraumatic.     Mouth/Throat:     Pharynx: Oropharynx is clear.  Eyes:     Extraocular Movements: Extraocular movements intact.     Pupils: Pupils are equal, round, and reactive to light.  Cardiovascular:     Rate and Rhythm: Regular rhythm. Tachycardia present.  Pulmonary:     Comments: Decreased breath sounds bilaterally.  Abdominal:     Palpations: Abdomen is soft.  Musculoskeletal:        General: Normal range of motion.     Cervical back: Normal range of motion.  Skin:    General: Skin  is warm.  Neurological:     General: No focal deficit present.     Mental Status: She is alert and oriented to person, place, and time.  Psychiatric:        Behavior: Behavior normal.        Judgment: Judgment normal.     LABORATORY DATA:  I have reviewed the data as listed Lab Results  Component Value Date   WBC 7.7 07/05/2021   HGB 8.4 (L) 07/05/2021   HCT 26.6 (L) 07/05/2021   MCV 82.1 07/05/2021   PLT 187 07/05/2021   Recent Labs    12/10/20 1850  12/27/20 2016 06/14/21 1603  NA 137 135 136  K 3.8 3.7 3.7  CL 106 104 105  CO2 23 25 21   GLUCOSE 90 87 125*  BUN 12 12 9   CREATININE 0.92 0.87 0.66  CALCIUM 9.3 8.7* 8.5*  GFRNONAA >60 >60  --   PROT  --  6.7 5.6*  ALBUMIN  --  3.4* 3.2*  AST  --  18 13  ALT  --  13 8  ALKPHOS  --  75 92  BILITOT  --  0.4 0.4  BILIDIR  --   --  0.11  IBILI  --   --  0.29     [place] MFM OB FOLLOW UP  Result Date: 07/03/2021 ----------------------------------------------------------------------  OBSTETRICS REPORT                       (Signed Final 07/03/2021 01:29 pm) ---------------------------------------------------------------------- Patient Info  ID #:       [id number]                          D.O.B.:  Jun 26, 1990 (31 yrs)  Name:       Shelby Obrien           Visit Date: 07/03/2021 01:15 pm ---------------------------------------------------------------------- Performed By  Attending:        Tama High MD        Ref. Address:     Faculty  Performed By:     Jeanene Erb BS,      Location[facility]                                     [facility]  Referred By:      Wende Mott CNM ---------------------------------------------------------------------- Orders  #  Description                           Code        Ordered By  1  [place] MFM  OB FOLLOW UP                   [id number]    Tama High ----------------------------------------------------------------------  #  Order #                     Accession #  Episode #  1  [id number]                   [id number]                 [id number] ---------------------------------------------------------------------- Indications  Polyhydramnios, second trimester,              O40.2XX0  antepartum condition or complication, fetus  unspecified  Large for gestational age fetus affecting      O36.60X0  management of mother  LR NIPS (materniT 21) female(Negative  Inheritest)  Asthma (Albuterol)                             O99.89 j45.909  [redacted] weeks gestation of pregnancy                Z3A.31  Obesity complicating pregnancy, third          O99.213  trimester (BMI 45)  Encounter for other antenatal screening        Z36.2  follow-up ---------------------------------------------------------------------- Fetal Evaluation  Num Of Fetuses:         1  Fetal Heart Rate(bpm):  152  Cardiac Activity:       Observed  Presentation:           Cephalic  Placenta:               Posterior  P. Cord Insertion:      Previously Visualized  Amniotic Fluid  AFI FV:      Within normal limits  AFI Sum(cm)     %Tile       Largest Pocket(cm)  17.9            66          6.1  RUQ(cm)       RLQ(cm)       LUQ(cm)        LLQ(cm)  4.2           4.2           6.1            3.4 ---------------------------------------------------------------------- Biometry  BPD:      78.7  mm     G. Age:  31w 4d         40  %    CI:        75.23   %    70 - 86                                                          FL/HC:      21.4   %    19.1 - 21.3  HC:      287.8  mm     G. Age:  31w 5d         17  %    HC/AC:      0.95        0.96 - 1.17  AC:      302.5  mm     G. Age:  34w 1d         98  %    FL/BPD:     78.3   %    71 - 87  FL:  61.6  mm     G. Age:  32w 0d         47  %    FL/AC:      20.4   %    20 - 24  Est. FW:    2110  gm    4 lb 10 oz       85  % ---------------------------------------------------------------------- OB History  Gravidity:    4         Term:   2         SAB:   1  Living:       2 ---------------------------------------------------------------------- Gestational Age  LMP:           31w 4d        Date:  11/24/20                 EDD:   08/31/21  U/S Today:     32w 3d                                        EDD:   08/25/21  Best:          31w 4d     Det. By:  LMP  (11/24/20)          EDD:   08/31/21 ---------------------------------------------------------------------- Anatomy  Cranium:               Appears normal         LVOT:                   Not well visualized  Cavum:                 Previously seen        Aortic Arch:            Previously seen  Ventricles:            Appears normal         Ductal Arch:            Previously seen  Choroid Plexus:        Previously seen        Diaphragm:              Appears normal  Cerebellum:            Previously seen        Stomach:                Appears normal, left                                                                        sided  Posterior Fossa:       Previously seen        Abdomen:                Appears normal  Nuchal Fold:           Not applicable (Q000111Q    Abdominal Wall:         Previously seen  wks GA)  Face:                  Appears normal         Cord Vessels:           Previously seen                         (orbits and profile)  Lips:                  Previously seen        Kidneys:                Appear normal  Palate:                Not well visualized    Bladder:                Appears normal  Thoracic:              Appears normal         Spine:                  Previously seen  Heart:                 Appears normal         Upper Extremities:      Previously seen                         (4CH, axis, and                         situs)  RVOT:                  Previously seen        Lower Extremities:      Previously seen  Other:  Fetus appears to be  female. Left heels/feet, open hands/ left 5th digit,          VC, 3VV and 3VTV previously visualized. Technically difficult due to          maternal habitus. ---------------------------------------------------------------------- Cervix Uterus Adnexa  Cervix  Not visualized (advanced GA >24wks) ---------------------------------------------------------------------- Impression  Fetal polyhydramnios was seen at previous ultrasound.  Prepregnancy BMI 45.  Fetal growth is appropriate for gestational age.  Amniotic fluid  is normal and good fetal activity seen.  I reassured the patient of normal amniotic fluid.  I encouraged her to get screened for gestational diabetes. ---------------------------------------------------------------------- Recommendations  -An appointment was made for her to return in 4 weeks for  BPP and then weekly BPP till delivery.  -Fetal growth in 4 weeks. ----------------------------------------------------------------------                  Tama High, MD Electronically Signed Final Report   07/03/2021 01:29 pm ----------------------------------------------------------------------  [place] MFM OB FOLLOW UP  Result Date: 06/07/2021 ----------------------------------------------------------------------  OBSTETRICS REPORT                       (Signed Final 06/07/2021 04:27 pm) ---------------------------------------------------------------------- Patient Info  ID #:       [id number]                          D.O.B.:  24-Sep-1990 (31 yrs)  Name:       Shelby Obrien  Visit Date: 06/07/2021 03:33 pm ---------------------------------------------------------------------- Performed By  Attending:        Tama High MD        Ref. Address:     Faculty  Performed By:     Jeanene Erb BS,      Location[facility]                                     [facility]                                                             [facility]                                                              [facility]  Referred By:      Wende Mott CNM ---------------------------------------------------------------------- Orders  #  Description                           Code        Ordered By  1  [place] MFM OB FOLLOW UP                   [id number]    Peterson Ao ----------------------------------------------------------------------  #  Order #                     Accession #                Episode #  1  [id number]                   [id number]                 [id number] ---------------------------------------------------------------------- Indications  Obesity complicating pregnancy, second         O99.212  trimester(BMI 45)  Polyhydramnios, second trimester,              O40.2XX0  antepartum condition or complication, fetus  unspecified  Large for gestational age fetus affecting      O58.60X0  management of mother  LR NIPS (materniT 21) female(Negative  Inheritest)  Encounter for other antenatal screening        Z36.2  follow-up  [redacted] weeks gestation of pregnancy                Z3A.27  Asthma (Albuterol)                             O99.89 j45.909 ---------------------------------------------------------------------- Fetal Evaluation  Num Of Fetuses:         1  Fetal Heart Rate(bpm):  147  Cardiac  Activity:       Observed  Presentation:           Cephalic  Placenta:               Posterior  P. Cord Insertion:      Previously Visualized  Amniotic Fluid  AFI FV:      Polyhydramnios  AFI Sum(cm)     %Tile       Largest Pocket(cm)  27              > 97        8.1  RUQ(cm)       RLQ(cm)       LUQ(cm)        LLQ(cm)  8.1           6.2           5.3            7.4 ---------------------------------------------------------------------- Biometry  BPD:        72  mm     G. Age:  28w 6d         72  %    CI:        78.91   %    70 - 86                                                          FL/HC:      21.0   %    18.8 - 20.6  HC:      256.3  mm     G. Age:  27w 6d         20  %    HC/AC:       0.94        1.05 - 1.21  AC:      274.1  mm     G. Age:  31w 3d       > 99  %    FL/BPD:     74.6   %    71 - 87  FL:       53.7  mm     G. Age:  28w 3d         53  %    FL/AC:      19.6   %    20 - 24  Est. FW:    1487  gm      3 lb 4 oz     98  % ---------------------------------------------------------------------- OB History  Gravidity:    4         Term:   2         SAB:   1  Living:       2 ---------------------------------------------------------------------- Gestational Age  LMP:           27w 6d        Date:  11/24/20                 EDD:   08/31/21  U/S Today:     29w 1d  EDD:   08/22/21  Best:          27w 6d     Det. By:  LMP  (11/24/20)          EDD:   08/31/21 ---------------------------------------------------------------------- Anatomy  Cranium:               Appears normal         LVOT:                   Not well visualized  Cavum:                 Previously seen        Aortic Arch:            Previously seen  Ventricles:            Appears normal         Ductal Arch:            Previously seen  Choroid Plexus:        Previously seen        Diaphragm:              Appears normal  Cerebellum:            Previously seen        Stomach:                Appears normal, left                                                                        sided  Posterior Fossa:       Previously seen        Abdomen:                Appears normal  Nuchal Fold:           Not applicable (Q000111Q    Abdominal Wall:         Previously seen                         wks GA)  Face:                  Appears normal         Cord Vessels:           Previously seen                         (orbits and profile)  Lips:                  Previously seen        Kidneys:                Appear normal  Palate:                Not well visualized    Bladder:                Appears normal  Thoracic:              Appears normal         Spine:  Previously seen  Heart:                  Appears normal         Upper Extremities:      Previously seen                         (4CH, axis, and                         situs)  RVOT:                  Previously seen        Lower Extremities:      Previously seen  Other:  Fetus appears to be female. Left heels/feet and open hands/ left 5th          digit previously visualized. VC, 3VV and 3VTV previously visualized. ---------------------------------------------------------------------- Cervix Uterus Adnexa  Cervix  Not visualized (advanced GA >24wks) ---------------------------------------------------------------------- Impression  Prepregnancy BMI 45.  Patient return for fetal growth  assessment.  She is here to screen for gestational diabetes.  Blood pressure today at her office is 119/69 mmHg.  On today's ultrasound, mild polyhydramnios is seen.  Good  fetal activity is present.  Fetal growth is appropriate for  gestational age.  Fetal stomach and kidneys appear normal.  I explained the possible causes of polyhydramnios including  gestational diabetes.  In the absence of fetal anomalies and  gestational diabetes, it is usually idiopathic (no known cause  and is not associated with adverse outcomes.  Polyhydramnios can lead to preterm labor. ---------------------------------------------------------------------- Recommendations  -An appointment was made for her to return in 4 weeks for  fetal growth assessment.  -Patient was encouraged to screen for gestational diabetes. ----------------------------------------------------------------------                  Tama High, MD Electronically Signed Final Report   06/07/2021 04:27 pm ----------------------------------------------------------------------   ASSESSMENT & PLAN:   Symptomatic anemia #Anemia-symptomatic hemoglobin 8-suspect iron deficiency/pregnancy [third trimester].  Recommend IV iron infusion weekly x4. Discussed the potential acute infusion reactions with IV iron; which are quite rare.   Patient understands the risk; will proceed with infusions.   # Recommend CBC CMP LDH peripheral smear; haptoglobin;  iron studies ferritin 123456 folic acid; reticulocyte count.  In case patient needed a blood transfusion check ABO/Rh; hold tube.  Review of smear.  #Tachycardia/hypotension: Initially tachycardic at 128; blood pressure 123XX123 systolic-likely secondary to poor p.o. intake.  Recommend increase fluid intake patient is here 1 L IV fluids.  Blood pressure and heart rate improved.  Thank you Dr.Harris for allowing me to participate in the care of your pleasant patient. Please do not hesitate to contact me with questions or concerns in the interim.  # DISPOSITION: # labs today-  cbc/cmp/iron studies/ferritin; LDH # IVFs 1 lits over 1 hour # venofer weekly x4 # follow up with NP in 6 weeks- labs- cbc/possible Venofer-Dr.B    All questions were answered. The patient knows to call the clinic with any problems, questions or concerns.    Cammie Sickle, MD 07/05/2021 4:45 PM

## 2021-07-05 NOTE — Progress Notes (Signed)
Pt's bp a little low, pulse elevated, feels dizzy. ?

## 2021-07-06 LAB — HAPTOGLOBIN: Haptoglobin: 104 mg/dL (ref 33–278)

## 2021-07-09 ENCOUNTER — Other Ambulatory Visit: Payer: Self-pay | Admitting: *Deleted

## 2021-07-09 DIAGNOSIS — O403XX0 Polyhydramnios, third trimester, not applicable or unspecified: Secondary | ICD-10-CM

## 2021-07-09 DIAGNOSIS — O3663X0 Maternal care for excessive fetal growth, third trimester, not applicable or unspecified: Secondary | ICD-10-CM

## 2021-07-09 DIAGNOSIS — Z6841 Body Mass Index (BMI) 40.0 and over, adult: Secondary | ICD-10-CM

## 2021-07-12 ENCOUNTER — Inpatient Hospital Stay: Payer: BC Managed Care – PPO

## 2021-07-12 ENCOUNTER — Other Ambulatory Visit: Payer: Self-pay

## 2021-07-12 VITALS — BP 128/80 | HR 97 | Temp 98.3°F | Resp 18

## 2021-07-12 DIAGNOSIS — Z3A31 31 weeks gestation of pregnancy: Secondary | ICD-10-CM | POA: Diagnosis not present

## 2021-07-12 DIAGNOSIS — Z87891 Personal history of nicotine dependence: Secondary | ICD-10-CM | POA: Diagnosis not present

## 2021-07-12 DIAGNOSIS — Z79899 Other long term (current) drug therapy: Secondary | ICD-10-CM | POA: Diagnosis not present

## 2021-07-12 DIAGNOSIS — R000 Tachycardia, unspecified: Secondary | ICD-10-CM | POA: Diagnosis not present

## 2021-07-12 DIAGNOSIS — D649 Anemia, unspecified: Secondary | ICD-10-CM | POA: Diagnosis not present

## 2021-07-12 DIAGNOSIS — I959 Hypotension, unspecified: Secondary | ICD-10-CM | POA: Diagnosis not present

## 2021-07-12 DIAGNOSIS — Z808 Family history of malignant neoplasm of other organs or systems: Secondary | ICD-10-CM | POA: Diagnosis not present

## 2021-07-12 MED ORDER — SODIUM CHLORIDE 0.9 % IV SOLN
Freq: Once | INTRAVENOUS | Status: AC
  Filled 2021-07-12: qty 250

## 2021-07-12 MED ORDER — IRON SUCROSE 20 MG/ML IV SOLN
200.0000 mg | Freq: Once | INTRAVENOUS | Status: AC
  Administered 2021-07-12: 200 mg via INTRAVENOUS
  Filled 2021-07-12: qty 10

## 2021-07-12 MED ORDER — SODIUM CHLORIDE 0.9 % IV SOLN: 200.0000 mg | Freq: Once | INTRAVENOUS | Status: DC

## 2021-07-13 ENCOUNTER — Inpatient Hospital Stay (HOSPITAL_COMMUNITY)
Admission: AD | Admit: 2021-07-13 | Discharge: 2021-07-13 | Disposition: A | Discharge status: Home / Self Care | Payer: BC Managed Care – PPO | Attending: Obstetrics and Gynecology | Admitting: Obstetrics and Gynecology

## 2021-07-13 ENCOUNTER — Encounter (HOSPITAL_COMMUNITY): Payer: Self-pay | Admitting: Obstetrics and Gynecology

## 2021-07-13 ENCOUNTER — Other Ambulatory Visit: Payer: Self-pay

## 2021-07-13 DIAGNOSIS — Z3A33 33 weeks gestation of pregnancy: Secondary | ICD-10-CM | POA: Diagnosis not present

## 2021-07-13 DIAGNOSIS — O479 False labor, unspecified: Secondary | ICD-10-CM | POA: Diagnosis not present

## 2021-07-13 DIAGNOSIS — O403XX0 Polyhydramnios, third trimester, not applicable or unspecified: Secondary | ICD-10-CM

## 2021-07-13 DIAGNOSIS — O4703 False labor before 37 completed weeks of gestation, third trimester: Secondary | ICD-10-CM | POA: Diagnosis not present

## 2021-07-13 DIAGNOSIS — Z3689 Encounter for other specified antenatal screening: Secondary | ICD-10-CM

## 2021-07-13 DIAGNOSIS — O99013 Anemia complicating pregnancy, third trimester: Secondary | ICD-10-CM

## 2021-07-13 DIAGNOSIS — D649 Anemia, unspecified: Secondary | ICD-10-CM | POA: Insufficient documentation

## 2021-07-13 LAB — URINALYSIS, ROUTINE W REFLEX MICROSCOPIC
Bilirubin Urine: NEGATIVE
Glucose, UA: NEGATIVE mg/dL
Hgb urine dipstick: NEGATIVE
Ketones, ur: NEGATIVE mg/dL
Nitrite: NEGATIVE
Protein, ur: 30 mg/dL — AB
Specific Gravity, Urine: 1.026 (ref 1.005–1.030)
pH: 5 (ref 5.0–8.0)

## 2021-07-13 NOTE — MAU Provider Note (Signed)
Event Date/Time  ? First Provider Initiated Contact with Patient 07/13/21 1805   ?  ? ?S ?Ms. Shelby Obrien is a 31 y.o. [phone] pregnant female at [redacted]w[redacted]d who presents to MAU today with complaint of dizziness, occasional contractions and increased pelvic pressure. Estimates the contractions are about 2/hr. Ate a biscuit before work (works as a Higher education careers adviser at [employer]) was on her feet for several hours, had sips of water and then ate a burger about two hours ago but has continued to feel dizzy. Denies decreased fetal movement, vaginal bleeding, loss of fluid, syncope or any other physical complaints.  ? ?Receives prenatal care at [facility]. Prenatal records reviewed. Was seen on 3/10 for symptomatic anemia (8.4) and was started on weekly iron infusions (last one was yesterday). Also has polydramnios (AFI 17.9 on 07/03/21). ? ?Pertinent items noted in HPI and remainder of comprehensive ROS otherwise negative.  ? ?O ?BP 116/71 (BP Location: Right Arm)   Pulse (!) 107   Temp 98.1 ?F (36.7 ?C) (Oral)   Resp 16   LMP 11/24/2020   SpO2 100%  ? ?Physical Exam ?Vitals and nursing note reviewed.  ?Constitutional:   ?   General: She is not in acute distress. ?   Appearance: Normal appearance. She is not ill-appearing.  ?HENT:  ?   Head: Normocephalic and atraumatic.  ?Eyes:  ?   Pupils: Pupils are equal, round, and reactive to light.  ?Cardiovascular:  ?   Rate and Rhythm: Normal rate and regular rhythm.  ?   Pulses: Normal pulses.  ?Pulmonary:  ?   Effort: Pulmonary effort is normal.  ?Abdominal:  ?   General: There is no distension.  ?   Palpations: Abdomen is soft.  ?   Tenderness: There is no abdominal tenderness.  ?Genitourinary: ?   General: Normal vulva.  ?Musculoskeletal:     ?   General: Normal range of motion.  ?Skin: ?   General: Skin is warm and dry.  ?   Capillary Refill: Capillary refill takes less than 2 seconds.  ?Neurological:  ?   Mental Status: She is alert and oriented to person,  place, and time.  ?Psychiatric:     ?   Mood and Affect: Mood normal.     ?   Behavior: Behavior normal.     ?   Thought Content: Thought content normal.     ?   Judgment: Judgment normal.  ? ?Dilation: Closed ?Effacement (%): Thick ?Cervical Position: Posterior ?Station: Ballotable ?Presentation: Undeterminable ?Exam by:: Tyler Aas, CNM  ? ?Fetal Tracing: reactive ?Baseline: 155 with palpable movement ?Variability: moderate ?Accelerations: 15x15 ?Decelerations: none ?Toco: relaxed ? ?MDM: ?CE = closed/thick/ballotable, no contractions on monitor, dizziness mostly resolved once pt had been sitting for awhile. ? ?Explained to pt that with her anemia and stage in pregnancy - going for so long without food or hydration would make her feel awful. Brainstormed with her and FOB ways she can increase her oral intake throughout the day. Encouraged her to continue going to her iron infusions and gave reassurance that they do not work immediately but peak a couple of weeks after. Pt and FOB expressed understanding. ? ?A ?False labor ?Anemia in pregnancy, third trimester ?Polyhydramnios ?NST reactive ?[redacted] weeks gestation of pregnancy ? ?P ?Discharge from MAU in stable condition with preterm labor precautions ?Follow up at [facility] as scheduled for ongoing prenatal care ? ?Bernerd Limbo, CNM ?07/13/2021 6:39 PM  ? ?

## 2021-07-13 NOTE — MAU Note (Signed)
Pt reports to mau with c/o irreg ctx that started around 0630 this morning.  Pt also reports feeling dizzy since this morning.  Pt denies LOF, or vag bleeding. +FM ?

## 2021-07-17 ENCOUNTER — Encounter: Payer: Self-pay | Admitting: Obstetrics & Gynecology

## 2021-07-17 ENCOUNTER — Other Ambulatory Visit: Payer: Self-pay

## 2021-07-17 ENCOUNTER — Observation Stay
Admission: EM | Admit: 2021-07-17 | Discharge: 2021-07-18 | Disposition: A | Discharge status: Home / Self Care | Payer: BC Managed Care – PPO | Attending: Licensed Practical Nurse | Admitting: Licensed Practical Nurse

## 2021-07-17 DIAGNOSIS — Y92410 Unspecified street and highway as the place of occurrence of the external cause: Secondary | ICD-10-CM | POA: Insufficient documentation

## 2021-07-17 DIAGNOSIS — M545 Low back pain, unspecified: Secondary | ICD-10-CM | POA: Insufficient documentation

## 2021-07-17 DIAGNOSIS — J45909 Unspecified asthma, uncomplicated: Secondary | ICD-10-CM | POA: Diagnosis not present

## 2021-07-17 DIAGNOSIS — O99513 Diseases of the respiratory system complicating pregnancy, third trimester: Secondary | ICD-10-CM | POA: Diagnosis not present

## 2021-07-17 DIAGNOSIS — Y9389 Activity, other specified: Secondary | ICD-10-CM | POA: Insufficient documentation

## 2021-07-17 DIAGNOSIS — Z3A33 33 weeks gestation of pregnancy: Secondary | ICD-10-CM | POA: Insufficient documentation

## 2021-07-17 DIAGNOSIS — Z87891 Personal history of nicotine dependence: Secondary | ICD-10-CM | POA: Insufficient documentation

## 2021-07-17 DIAGNOSIS — O9A213 Injury, poisoning and certain other consequences of external causes complicating pregnancy, third trimester: Secondary | ICD-10-CM | POA: Diagnosis not present

## 2021-07-17 DIAGNOSIS — O26893 Other specified pregnancy related conditions, third trimester: Secondary | ICD-10-CM | POA: Insufficient documentation

## 2021-07-17 LAB — CBC WITH DIFFERENTIAL/PLATELET
Abs Immature Granulocytes: 0.06 10*3/uL (ref 0.00–0.07)
Basophils Absolute: 0 10*3/uL (ref 0.0–0.1)
Basophils Relative: 0 %
Eosinophils Absolute: 0.1 10*3/uL (ref 0.0–0.5)
Eosinophils Relative: 1 %
HCT: 25 % — ABNORMAL LOW (ref 36.0–46.0)
Hemoglobin: 8 g/dL — ABNORMAL LOW (ref 12.0–15.0)
Immature Granulocytes: 1 %
Lymphocytes Relative: 17 %
Lymphs Abs: 1.4 10*3/uL (ref 0.7–4.0)
MCH: 25.6 pg — ABNORMAL LOW (ref 26.0–34.0)
MCHC: 32 g/dL (ref 30.0–36.0)
MCV: 80.1 fL (ref 80.0–100.0)
Monocytes Absolute: 0.5 10*3/uL (ref 0.1–1.0)
Monocytes Relative: 7 %
Neutro Abs: 6 10*3/uL (ref 1.7–7.7)
Neutrophils Relative %: 74 %
Platelets: 178 10*3/uL (ref 150–400)
RBC: 3.12 MIL/uL — ABNORMAL LOW (ref 3.87–5.11)
RDW: 17.9 % — ABNORMAL HIGH (ref 11.5–15.5)
WBC: 8.1 10*3/uL (ref 4.0–10.5)
nRBC: 0.2 % (ref 0.0–0.2)

## 2021-07-17 LAB — PROTIME-INR
INR: 1.1 (ref 0.8–1.2)
Prothrombin Time: 14.3 seconds (ref 11.4–15.2)

## 2021-07-17 LAB — APTT: aPTT: 31 seconds (ref 24–36)

## 2021-07-17 MED ORDER — LACTATED RINGERS IV BOLUS
1000.0000 mL | Freq: Once | INTRAVENOUS | Status: AC
  Administered 2021-07-17: 1000 mL via INTRAVENOUS

## 2021-07-17 MED ORDER — ACETAMINOPHEN 500 MG PO TABS
1000.0000 mg | ORAL_TABLET | Freq: Four times a day (QID) | ORAL | Status: DC | PRN
  Administered 2021-07-17: 1000 mg via ORAL
  Filled 2021-07-17: qty 2
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
## 2021-07-18 DIAGNOSIS — Y9389 Activity, other specified: Secondary | ICD-10-CM | POA: Diagnosis not present

## 2021-07-18 DIAGNOSIS — Y92410 Unspecified street and highway as the place of occurrence of the external cause: Secondary | ICD-10-CM | POA: Diagnosis not present

## 2021-07-18 DIAGNOSIS — O9A213 Injury, poisoning and certain other consequences of external causes complicating pregnancy, third trimester: Secondary | ICD-10-CM | POA: Diagnosis not present

## 2021-07-18 DIAGNOSIS — O26893 Other specified pregnancy related conditions, third trimester: Secondary | ICD-10-CM

## 2021-07-18 DIAGNOSIS — Z3A33 33 weeks gestation of pregnancy: Secondary | ICD-10-CM

## 2021-07-18 DIAGNOSIS — O99513 Diseases of the respiratory system complicating pregnancy, third trimester: Secondary | ICD-10-CM | POA: Diagnosis not present

## 2021-07-18 DIAGNOSIS — Z87891 Personal history of nicotine dependence: Secondary | ICD-10-CM | POA: Diagnosis not present

## 2021-07-18 DIAGNOSIS — J45909 Unspecified asthma, uncomplicated: Secondary | ICD-10-CM | POA: Diagnosis not present

## 2021-07-18 DIAGNOSIS — M545 Low back pain, unspecified: Secondary | ICD-10-CM | POA: Diagnosis not present

## 2021-07-18 LAB — COMPREHENSIVE METABOLIC PANEL
ALT: 10 U/L (ref 0–44)
AST: 20 U/L (ref 15–41)
Albumin: 2.3 g/dL — ABNORMAL LOW (ref 3.5–5.0)
Alkaline Phosphatase: 84 U/L (ref 38–126)
Anion gap: 6 (ref 5–15)
BUN: 8 mg/dL (ref 6–20)
CO2: 23 mmol/L (ref 22–32)
Calcium: 8.1 mg/dL — ABNORMAL LOW (ref 8.9–10.3)
Chloride: 105 mmol/L (ref 98–111)
Creatinine, Ser: 0.64 mg/dL (ref 0.44–1.00)
GFR, Estimated: >60 mL/min (ref >60)
Glucose, Bld: 108 mg/dL — ABNORMAL HIGH (ref 70–99)
Potassium: 3.5 mmol/L (ref 3.5–5.1)
Sodium: 134 mmol/L — ABNORMAL LOW (ref 135–145)
Total Bilirubin: 0.5 mg/dL (ref 0.3–1.2)
Total Protein: 5.9 g/dL — ABNORMAL LOW (ref 6.5–8.1)

## 2021-07-18 LAB — KLEIHAUER-BETKE STAIN
Fetal Cells %: 0 %
Quantitation Fetal Hemoglobin: 0 mL

## 2021-07-18 MED ORDER — ACETAMINOPHEN 500 MG PO TABS: 1000.0000 mg | ORAL_TABLET | Freq: Four times a day (QID) | ORAL | 0 refills | Status: DC | PRN
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
## 2021-07-19 ENCOUNTER — Encounter: Payer: Self-pay | Admitting: Licensed Practical Nurse

## 2021-07-19 ENCOUNTER — Inpatient Hospital Stay: Payer: BC Managed Care – PPO

## 2021-07-19 ENCOUNTER — Ambulatory Visit (INDEPENDENT_AMBULATORY_CARE_PROVIDER_SITE_OTHER): Payer: BC Managed Care – PPO | Admitting: Licensed Practical Nurse

## 2021-07-19 ENCOUNTER — Other Ambulatory Visit: Payer: Self-pay

## 2021-07-19 VITALS — BP 118/60 | Wt 272.0 lb

## 2021-07-19 VITALS — BP 119/76 | HR 91 | Temp 97.7°F | Resp 18

## 2021-07-19 DIAGNOSIS — O0990 Supervision of high risk pregnancy, unspecified, unspecified trimester: Secondary | ICD-10-CM

## 2021-07-19 DIAGNOSIS — R000 Tachycardia, unspecified: Secondary | ICD-10-CM | POA: Diagnosis not present

## 2021-07-19 DIAGNOSIS — O99891 Other specified diseases and conditions complicating pregnancy: Secondary | ICD-10-CM

## 2021-07-19 DIAGNOSIS — Z79899 Other long term (current) drug therapy: Secondary | ICD-10-CM | POA: Diagnosis not present

## 2021-07-19 DIAGNOSIS — D649 Anemia, unspecified: Secondary | ICD-10-CM

## 2021-07-19 DIAGNOSIS — Z3A31 31 weeks gestation of pregnancy: Secondary | ICD-10-CM | POA: Diagnosis not present

## 2021-07-19 DIAGNOSIS — Z3A33 33 weeks gestation of pregnancy: Secondary | ICD-10-CM

## 2021-07-19 DIAGNOSIS — O99013 Anemia complicating pregnancy, third trimester: Secondary | ICD-10-CM

## 2021-07-19 DIAGNOSIS — Z808 Family history of malignant neoplasm of other organs or systems: Secondary | ICD-10-CM | POA: Diagnosis not present

## 2021-07-19 DIAGNOSIS — Z87891 Personal history of nicotine dependence: Secondary | ICD-10-CM | POA: Diagnosis not present

## 2021-07-19 DIAGNOSIS — I959 Hypotension, unspecified: Secondary | ICD-10-CM | POA: Diagnosis not present

## 2021-07-19 DIAGNOSIS — M549 Dorsalgia, unspecified: Secondary | ICD-10-CM

## 2021-07-19 LAB — POCT URINALYSIS DIPSTICK OB
Glucose, UA: NEGATIVE
POC,PROTEIN,UA: NEGATIVE

## 2021-07-19 LAB — TYPE AND SCREEN
ABO/RH(D): A POS
Antibody Screen: NEGATIVE

## 2021-07-19 MED ORDER — SODIUM CHLORIDE 0.9 % IV SOLN: 200.0000 mg | Freq: Once | INTRAVENOUS | Status: DC

## 2021-07-19 MED ORDER — IRON SUCROSE 20 MG/ML IV SOLN
200.0000 mg | Freq: Once | INTRAVENOUS | Status: AC
  Administered 2021-07-19: 200 mg via INTRAVENOUS
  Filled 2021-07-19: qty 10

## 2021-07-19 MED ORDER — SODIUM CHLORIDE 0.9 % IV SOLN
Freq: Once | INTRAVENOUS | Status: AC
  Filled 2021-07-19: qty 250

## 2021-07-19 NOTE — Patient Instructions (Signed)
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
## 2021-07-26 ENCOUNTER — Ambulatory Visit: Payer: BC Managed Care – PPO | Attending: Obstetrics and Gynecology

## 2021-07-26 ENCOUNTER — Inpatient Hospital Stay: Payer: BC Managed Care – PPO

## 2021-07-26 ENCOUNTER — Ambulatory Visit: Payer: BC Managed Care – PPO

## 2021-07-26 ENCOUNTER — Ambulatory Visit: Payer: BC Managed Care – PPO | Admitting: *Deleted

## 2021-07-26 ENCOUNTER — Encounter: Payer: Self-pay | Admitting: *Deleted

## 2021-07-26 ENCOUNTER — Other Ambulatory Visit: Payer: Self-pay | Admitting: Obstetrics and Gynecology

## 2021-07-26 VITALS — BP 106/72 | HR 92 | Temp 97.1°F | Resp 18

## 2021-07-26 VITALS — BP 124/68 | HR 86

## 2021-07-26 DIAGNOSIS — Z362 Encounter for other antenatal screening follow-up: Secondary | ICD-10-CM

## 2021-07-26 DIAGNOSIS — R000 Tachycardia, unspecified: Secondary | ICD-10-CM | POA: Diagnosis not present

## 2021-07-26 DIAGNOSIS — O0990 Supervision of high risk pregnancy, unspecified, unspecified trimester: Secondary | ICD-10-CM

## 2021-07-26 DIAGNOSIS — J45909 Unspecified asthma, uncomplicated: Secondary | ICD-10-CM

## 2021-07-26 DIAGNOSIS — Z6841 Body Mass Index (BMI) 40.0 and over, adult: Secondary | ICD-10-CM | POA: Diagnosis not present

## 2021-07-26 DIAGNOSIS — Z3A34 34 weeks gestation of pregnancy: Secondary | ICD-10-CM

## 2021-07-26 DIAGNOSIS — Z87891 Personal history of nicotine dependence: Secondary | ICD-10-CM | POA: Diagnosis not present

## 2021-07-26 DIAGNOSIS — Z808 Family history of malignant neoplasm of other organs or systems: Secondary | ICD-10-CM | POA: Diagnosis not present

## 2021-07-26 DIAGNOSIS — Z79899 Other long term (current) drug therapy: Secondary | ICD-10-CM | POA: Diagnosis not present

## 2021-07-26 DIAGNOSIS — O99513 Diseases of the respiratory system complicating pregnancy, third trimester: Secondary | ICD-10-CM

## 2021-07-26 DIAGNOSIS — O99213 Obesity complicating pregnancy, third trimester: Secondary | ICD-10-CM | POA: Diagnosis not present

## 2021-07-26 DIAGNOSIS — O3663X0 Maternal care for excessive fetal growth, third trimester, not applicable or unspecified: Secondary | ICD-10-CM

## 2021-07-26 DIAGNOSIS — O403XX0 Polyhydramnios, third trimester, not applicable or unspecified: Secondary | ICD-10-CM | POA: Diagnosis not present

## 2021-07-26 DIAGNOSIS — I959 Hypotension, unspecified: Secondary | ICD-10-CM | POA: Diagnosis not present

## 2021-07-26 DIAGNOSIS — Z3A31 31 weeks gestation of pregnancy: Secondary | ICD-10-CM | POA: Diagnosis not present

## 2021-07-26 DIAGNOSIS — D649 Anemia, unspecified: Secondary | ICD-10-CM

## 2021-07-26 DIAGNOSIS — E669 Obesity, unspecified: Secondary | ICD-10-CM

## 2021-07-26 MED ORDER — SODIUM CHLORIDE 0.9 % IV SOLN: 200.0000 mg | Freq: Once | INTRAVENOUS | Status: DC

## 2021-07-26 MED ORDER — SODIUM CHLORIDE 0.9 % IV SOLN
Freq: Once | INTRAVENOUS | Status: AC
  Filled 2021-07-26: qty 250

## 2021-07-26 MED ORDER — IRON SUCROSE 20 MG/ML IV SOLN
200.0000 mg | Freq: Once | INTRAVENOUS | Status: AC
  Administered 2021-07-26: 200 mg via INTRAVENOUS
  Filled 2021-07-26: qty 10

## 2021-07-26 NOTE — Procedures (Signed)
Shelby Obrien ?1990-09-03 ?[redacted]w[redacted]d ? ?Fetus A Non-Stress Test Interpretation for 07/26/21 ? ?Indication: Unsatisfactory BPP ? ?Fetal Heart Rate A ?Mode: External ?Baseline Rate (A): 145 bpm ?Variability: Moderate ?Accelerations: 15 x 15 ?Decelerations: None ?Multiple birth?: No ? ?Uterine Activity ?Mode: Toco ?Contraction Frequency (min): 2 in 30 min ?Contraction Duration (sec): 80-90 ?Contraction Quality: Mild ?Resting Tone Palpated: Relaxed ?Resting Time: Adequate ? ?Interpretation (Fetal Testing) ?Nonstress Test Interpretation: Reactive ?Overall Impression: Reassuring for gestational age ?Comments: tracing reviewed by Dr. Parke Poisson ? ? ?

## 2021-07-26 NOTE — Patient Instructions (Signed)
[facility]  Discharge Instructions: ?Thank you for choosing [facility] to provide your oncology and hematology care.  ?If you have a lab appointment with the Cancer Center, please go directly to the Cancer Center and check in at the registration area. ? ?Wear comfortable clothing and clothing appropriate for easy access to any Portacath or PICC line.  ? ?We strive to give you quality time with your provider. You may need to reschedule your appointment if you arrive late (15 or more minutes).  Arriving late affects you and other patients whose appointments are after yours.  Also, if you miss three or more appointments without notifying the office, you may be dismissed from the clinic at the provider?s discretion.    ?  ?For prescription refill requests, have your pharmacy contact our office and allow 72 hours for refills to be completed.   ? ?Today you received the following chemotherapy and/or immunotherapy agents VENOFER    ?  ?To help prevent nausea and vomiting after your treatment, we encourage you to take your nausea medication as directed. ? ?BELOW ARE SYMPTOMS THAT SHOULD BE REPORTED IMMEDIATELY: ?*FEVER GREATER THAN 100.4 F (38 ?C) OR HIGHER ?*CHILLS OR SWEATING ?*NAUSEA AND VOMITING THAT IS NOT CONTROLLED WITH YOUR NAUSEA MEDICATION ?*UNUSUAL SHORTNESS OF BREATH ?*UNUSUAL BRUISING OR BLEEDING ?*URINARY PROBLEMS (pain or burning when urinating, or frequent urination) ?*BOWEL PROBLEMS (unusual diarrhea, constipation, pain near the anus) ?TENDERNESS IN MOUTH AND THROAT WITH OR WITHOUT PRESENCE OF ULCERS (sore throat, sores in mouth, or a toothache) ?UNUSUAL RASH, SWELLING OR PAIN  ?UNUSUAL VAGINAL DISCHARGE OR ITCHING  ? ?Items with * indicate a potential emergency and should be followed up as soon as possible or go to the Emergency Department if any problems should occur. ? ?Please show the CHEMOTHERAPY ALERT CARD or IMMUNOTHERAPY ALERT CARD at check-in to the  Emergency Department and triage nurse. ? ?Should you have questions after your visit or need to cancel or reschedule your appointment, please contact [facility]  [phone] and follow the prompts.  Office hours are 8:00 a.m. to 4:30 p.m. Monday - Friday. Please note that voicemails left after 4:00 p.m. may not be returned until the following business day.  We are closed weekends and major holidays. You have access to a nurse at all times for urgent questions. Please call the main number to the clinic [phone] and follow the prompts. ? ?For any non-urgent questions, you may also contact your provider using MyChart. We now offer e-Visits for anyone 18 and older to request care online for non-urgent symptoms. For details visit [email or web address]. ?  ?Also download the MyChart app! Go to the app store, search "MyChart", open the app, select [facility], and log in with your MyChart username and password. ? ?Due to Covid, a mask is required upon entering the hospital/clinic. If you do not have a mask, one will be given to you upon arrival. For doctor visits, patients may have 1 support person aged 18 or older with them. For treatment visits, patients cannot have anyone with them due to current Covid guidelines and our immunocompromised population.  ? ?Iron Sucrose Injection ?What is this medication? ?IRON SUCROSE (EYE ern SOO krose) treats low levels of iron (iron deficiency anemia) in people with kidney disease. Iron is a mineral that plays an important role in making red blood cells, which carry oxygen from your lungs to the rest of your body. ?This medicine may   be used for other purposes; ask your health care provider or pharmacist if you have questions. ?COMMON BRAND NAME(S): Venofer ?What should I tell my care team before I take this medication? ?They need to know if you have any of these conditions: ?Anemia not caused by low iron levels ?Heart disease ?High levels of  iron in the blood ?Kidney disease ?Liver disease ?An unusual or allergic reaction to iron, other medications, foods, dyes, or preservatives ?Pregnant or trying to get pregnant ?Breast-feeding ?How should I use this medication? ?This medication is for infusion into a vein. It is given in a hospital or clinic setting. ?Talk to your care team about the use of this medication in children. While this medication may be prescribed for children as young as 2 years for selected conditions, precautions do apply. ?Overdosage: If you think you have taken too much of this medicine contact a poison control center or emergency room at once. ?NOTE: This medicine is only for you. Do not share this medicine with others. ?What if I miss a dose? ?It is important not to miss your dose. Call your care team if you are unable to keep an appointment. ?What may interact with this medication? ?Do not take this medication with any of the following: ?Deferoxamine ?Dimercaprol ?Other iron products ?This medication may also interact with the following: ?Chloramphenicol ?Deferasirox ?This list may not describe all possible interactions. Give your health care provider a list of all the medicines, herbs, non-prescription drugs, or dietary supplements you use. Also tell them if you smoke, drink alcohol, or use illegal drugs. Some items may interact with your medicine. ?What should I watch for while using this medication? ?Visit your care team regularly. Tell your care team if your symptoms do not start to get better or if they get worse. You may need blood work done while you are taking this medication. ?You may need to follow a special diet. Talk to your care team. Foods that contain iron include: whole grains/cereals, dried fruits, beans, or peas, leafy green vegetables, and organ meats (liver, kidney). ?What side effects may I notice from receiving this medication? ?Side effects that you should report to your care team as soon as  possible: ?Allergic reactions--skin rash, itching, hives, swelling of the face, lips, tongue, or throat ?Low blood pressure--dizziness, feeling faint or lightheaded, blurry vision ?Shortness of breath ?Side effects that usually do not require medical attention (report to your care team if they continue or are bothersome): ?Flushing ?Headache ?Joint pain ?Muscle pain ?Nausea ?Pain, redness, or irritation at injection site ?This list may not describe all possible side effects. Call your doctor for medical advice about side effects. You may report side effects to FDA at 1-800-FDA-1088. ?Where should I keep my medication? ?This medication is given in a hospital or clinic and will not be stored at home. ?NOTE: This sheet is a summary. It may not cover all possible information. If you have questions about this medicine, talk to your doctor, pharmacist, or health care provider. ?? 2022 Elsevier/Gold Standard (2020-09-07 00:00:00) ? ?

## 2021-07-29 ENCOUNTER — Ambulatory Visit: Payer: BC Managed Care – PPO | Admitting: Cardiology

## 2021-07-31 ENCOUNTER — Ambulatory Visit (INDEPENDENT_AMBULATORY_CARE_PROVIDER_SITE_OTHER): Payer: BC Managed Care – PPO | Admitting: Obstetrics

## 2021-07-31 ENCOUNTER — Telehealth: Payer: Self-pay

## 2021-07-31 VITALS — BP 126/84

## 2021-07-31 DIAGNOSIS — Z3A35 35 weeks gestation of pregnancy: Secondary | ICD-10-CM

## 2021-07-31 DIAGNOSIS — S39012A Strain of muscle, fascia and tendon of lower back, initial encounter: Secondary | ICD-10-CM

## 2021-07-31 DIAGNOSIS — Z113 Encounter for screening for infections with a predominantly sexual mode of transmission: Secondary | ICD-10-CM | POA: Diagnosis not present

## 2021-07-31 DIAGNOSIS — O0990 Supervision of high risk pregnancy, unspecified, unspecified trimester: Secondary | ICD-10-CM

## 2021-07-31 MED ORDER — CYCLOBENZAPRINE HCL 10 MG PO TABS: 10.0000 mg | ORAL_TABLET | Freq: Three times a day (TID) | ORAL | 1 refills | Status: DC | PRN

## 2021-07-31 NOTE — Progress Notes (Signed)
Pt having a lot of vaginal pressure and pain. No vb  ?

## 2021-07-31 NOTE — Telephone Encounter (Signed)
Pt calling; how to relieve nerve pinching in back like sciatica; having a lot of pressure and cramping.  [phone]  Pt states she is having ctxs every 30-45 minutes; adv nl; we don't want her to have more than four an hour until she is 37wks; adv e.s. tylenol, heat, stretches; pt states she is having pain in vagina so much so it's hard to get in and out of the car; adv to be seen; tx'd to [facility] for scheduling. ?
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
## 2021-08-01 ENCOUNTER — Ambulatory Visit: Payer: BC Managed Care – PPO | Admitting: *Deleted

## 2021-08-01 ENCOUNTER — Other Ambulatory Visit: Payer: Self-pay | Admitting: Obstetrics and Gynecology

## 2021-08-01 ENCOUNTER — Encounter: Payer: Self-pay | Admitting: *Deleted

## 2021-08-01 ENCOUNTER — Ambulatory Visit (INDEPENDENT_AMBULATORY_CARE_PROVIDER_SITE_OTHER): Payer: BC Managed Care – PPO | Admitting: Obstetrics

## 2021-08-01 ENCOUNTER — Ambulatory Visit (HOSPITAL_BASED_OUTPATIENT_CLINIC_OR_DEPARTMENT_OTHER): Payer: BC Managed Care – PPO | Admitting: *Deleted

## 2021-08-01 ENCOUNTER — Ambulatory Visit: Payer: BC Managed Care – PPO | Attending: Obstetrics and Gynecology

## 2021-08-01 VITALS — BP 115/69 | HR 92

## 2021-08-01 VITALS — BP 122/74 | Wt 272.0 lb

## 2021-08-01 DIAGNOSIS — O403XX0 Polyhydramnios, third trimester, not applicable or unspecified: Secondary | ICD-10-CM

## 2021-08-01 DIAGNOSIS — O3663X0 Maternal care for excessive fetal growth, third trimester, not applicable or unspecified: Secondary | ICD-10-CM

## 2021-08-01 DIAGNOSIS — Z3689 Encounter for other specified antenatal screening: Secondary | ICD-10-CM | POA: Diagnosis not present

## 2021-08-01 DIAGNOSIS — O99513 Diseases of the respiratory system complicating pregnancy, third trimester: Secondary | ICD-10-CM | POA: Diagnosis not present

## 2021-08-01 DIAGNOSIS — O0990 Supervision of high risk pregnancy, unspecified, unspecified trimester: Secondary | ICD-10-CM

## 2021-08-01 DIAGNOSIS — E669 Obesity, unspecified: Secondary | ICD-10-CM

## 2021-08-01 DIAGNOSIS — O99213 Obesity complicating pregnancy, third trimester: Secondary | ICD-10-CM | POA: Diagnosis not present

## 2021-08-01 DIAGNOSIS — Z3A35 35 weeks gestation of pregnancy: Secondary | ICD-10-CM

## 2021-08-01 DIAGNOSIS — Z362 Encounter for other antenatal screening follow-up: Secondary | ICD-10-CM

## 2021-08-01 DIAGNOSIS — J45909 Unspecified asthma, uncomplicated: Secondary | ICD-10-CM

## 2021-08-01 DIAGNOSIS — O0993 Supervision of high risk pregnancy, unspecified, third trimester: Secondary | ICD-10-CM

## 2021-08-01 NOTE — Procedures (Signed)
[place] ?11-09-1990 ?[redacted]w[redacted]d ? ?Fetus A Non-Stress Test Interpretation for 08/01/21 ? ?Indication: Unsatisfactory BPP ? ?Fetal Heart Rate A ?Mode: External ?Baseline Rate (A): 140 bpm ?Variability: Moderate ?Accelerations: 15 x 15 ?Decelerations: None ?Multiple birth?: No ? ?Uterine Activity ?Mode: Toco ?Contraction Frequency (min): occasional ?Contraction Duration (sec): 40-130 ?Contraction Quality: Mild ?Resting Tone Palpated: Relaxed ? ?Interpretation (Fetal Testing) ?Nonstress Test Interpretation: Reactive ?Overall Impression: Reassuring for gestational age ?Comments: tracing reviewed by Dr. Gertie Exon ? ? ?
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
## 2021-08-01 NOTE — Progress Notes (Signed)
No vb. No lof. Growth scan today. 

## 2021-08-02 ENCOUNTER — Inpatient Hospital Stay: Payer: BC Managed Care – PPO | Attending: Internal Medicine

## 2021-08-02 VITALS — BP 118/79 | HR 91 | Temp 97.5°F

## 2021-08-02 DIAGNOSIS — R030 Elevated blood-pressure reading, without diagnosis of hypertension: Secondary | ICD-10-CM | POA: Diagnosis not present

## 2021-08-02 DIAGNOSIS — O9902 Anemia complicating childbirth: Secondary | ICD-10-CM | POA: Diagnosis not present

## 2021-08-02 DIAGNOSIS — O26893 Other specified pregnancy related conditions, third trimester: Secondary | ICD-10-CM | POA: Diagnosis not present

## 2021-08-02 DIAGNOSIS — F418 Other specified anxiety disorders: Secondary | ICD-10-CM | POA: Diagnosis not present

## 2021-08-02 DIAGNOSIS — O99824 Streptococcus B carrier state complicating childbirth: Secondary | ICD-10-CM | POA: Diagnosis not present

## 2021-08-02 DIAGNOSIS — O99214 Obesity complicating childbirth: Secondary | ICD-10-CM | POA: Diagnosis not present

## 2021-08-02 DIAGNOSIS — Z3A36 36 weeks gestation of pregnancy: Secondary | ICD-10-CM | POA: Diagnosis not present

## 2021-08-02 DIAGNOSIS — O99344 Other mental disorders complicating childbirth: Secondary | ICD-10-CM | POA: Diagnosis not present

## 2021-08-02 DIAGNOSIS — D649 Anemia, unspecified: Secondary | ICD-10-CM

## 2021-08-02 DIAGNOSIS — O99820 Streptococcus B carrier state complicating pregnancy: Secondary | ICD-10-CM | POA: Diagnosis not present

## 2021-08-02 DIAGNOSIS — O42013 Preterm premature rupture of membranes, onset of labor within 24 hours of rupture, third trimester: Secondary | ICD-10-CM | POA: Diagnosis not present

## 2021-08-02 MED ORDER — SODIUM CHLORIDE 0.9 % IV SOLN
Freq: Once | INTRAVENOUS | Status: AC
  Filled 2021-08-02: qty 250

## 2021-08-02 MED ORDER — SODIUM CHLORIDE 0.9 % IV SOLN: 200.0000 mg | Freq: Once | INTRAVENOUS | Status: DC

## 2021-08-02 MED ORDER — IRON SUCROSE 20 MG/ML IV SOLN
200.0000 mg | Freq: Once | INTRAVENOUS | Status: AC
  Administered 2021-08-02: 200 mg via INTRAVENOUS
  Filled 2021-08-02: qty 10

## 2021-08-03 LAB — GC/CHLAMYDIA PROBE AMP
Chlamydia trachomatis, NAA: NEGATIVE
Neisseria Gonorrhoeae by PCR: NEGATIVE

## 2021-08-05 ENCOUNTER — Other Ambulatory Visit: Payer: Self-pay

## 2021-08-05 ENCOUNTER — Encounter (HOSPITAL_COMMUNITY): Payer: Self-pay | Admitting: Obstetrics & Gynecology

## 2021-08-05 ENCOUNTER — Inpatient Hospital Stay (HOSPITAL_COMMUNITY)
Admission: AD | Admit: 2021-08-05 | Discharge: 2021-08-07 | DRG: 807 | Disposition: A | Discharge status: Home / Self Care | Payer: BC Managed Care – PPO | Attending: Family Medicine | Admitting: Family Medicine

## 2021-08-05 ENCOUNTER — Inpatient Hospital Stay (HOSPITAL_COMMUNITY): Payer: BC Managed Care – PPO | Admitting: Anesthesiology

## 2021-08-05 ENCOUNTER — Encounter: Payer: Self-pay | Admitting: Internal Medicine

## 2021-08-05 ENCOUNTER — Encounter: Payer: Self-pay | Admitting: Cardiology

## 2021-08-05 DIAGNOSIS — O9902 Anemia complicating childbirth: Secondary | ICD-10-CM | POA: Diagnosis present

## 2021-08-05 DIAGNOSIS — Z3A36 36 weeks gestation of pregnancy: Secondary | ICD-10-CM

## 2021-08-05 DIAGNOSIS — O99824 Streptococcus B carrier state complicating childbirth: Secondary | ICD-10-CM | POA: Diagnosis present

## 2021-08-05 DIAGNOSIS — O99210 Obesity complicating pregnancy, unspecified trimester: Secondary | ICD-10-CM | POA: Diagnosis present

## 2021-08-05 DIAGNOSIS — D649 Anemia, unspecified: Secondary | ICD-10-CM | POA: Diagnosis present

## 2021-08-05 DIAGNOSIS — O42013 Preterm premature rupture of membranes, onset of labor within 24 hours of rupture, third trimester: Secondary | ICD-10-CM

## 2021-08-05 DIAGNOSIS — R030 Elevated blood-pressure reading, without diagnosis of hypertension: Secondary | ICD-10-CM | POA: Diagnosis present

## 2021-08-05 DIAGNOSIS — O99214 Obesity complicating childbirth: Secondary | ICD-10-CM | POA: Diagnosis present

## 2021-08-05 DIAGNOSIS — O0990 Supervision of high risk pregnancy, unspecified, unspecified trimester: Secondary | ICD-10-CM

## 2021-08-05 DIAGNOSIS — O26893 Other specified pregnancy related conditions, third trimester: Secondary | ICD-10-CM | POA: Diagnosis present

## 2021-08-05 DIAGNOSIS — O99820 Streptococcus B carrier state complicating pregnancy: Secondary | ICD-10-CM

## 2021-08-05 LAB — CBC
HCT: 31.5 % — ABNORMAL LOW (ref 36.0–46.0)
Hemoglobin: 10 g/dL — ABNORMAL LOW (ref 12.0–15.0)
MCH: 26.9 pg (ref 26.0–34.0)
MCHC: 31.7 g/dL (ref 30.0–36.0)
MCV: 84.7 fL (ref 80.0–100.0)
Platelets: 192 10*3/uL (ref 150–400)
RBC: 3.72 MIL/uL — ABNORMAL LOW (ref 3.87–5.11)
RDW: 21.7 % — ABNORMAL HIGH (ref 11.5–15.5)
WBC: 6.6 10*3/uL (ref 4.0–10.5)
nRBC: 0 % (ref 0.0–0.2)

## 2021-08-05 LAB — COMPREHENSIVE METABOLIC PANEL
ALT: 11 U/L (ref 0–44)
AST: 18 U/L (ref 15–41)
Albumin: 2.6 g/dL — ABNORMAL LOW (ref 3.5–5.0)
Alkaline Phosphatase: 98 U/L (ref 38–126)
Anion gap: 7 (ref 5–15)
BUN: 6 mg/dL (ref 6–20)
CO2: 20 mmol/L — ABNORMAL LOW (ref 22–32)
Calcium: 8.7 mg/dL — ABNORMAL LOW (ref 8.9–10.3)
Chloride: 108 mmol/L (ref 98–111)
Creatinine, Ser: 0.71 mg/dL (ref 0.44–1.00)
GFR, Estimated: >60 mL/min (ref >60)
Glucose, Bld: 93 mg/dL (ref 70–99)
Potassium: 4 mmol/L (ref 3.5–5.1)
Sodium: 135 mmol/L (ref 135–145)
Total Bilirubin: 0.7 mg/dL (ref 0.3–1.2)
Total Protein: 5.8 g/dL — ABNORMAL LOW (ref 6.5–8.1)

## 2021-08-05 LAB — POCT FERN TEST: POCT Fern Test: POSITIVE

## 2021-08-05 LAB — PROTEIN / CREATININE RATIO, URINE
Creatinine, Urine: 163.74 mg/dL
Protein Creatinine Ratio: 0.19 mg/mg{Cre} — ABNORMAL HIGH (ref 0.00–0.15)
Total Protein, Urine: 31 mg/dL

## 2021-08-05 LAB — RPR: RPR Ser Ql: NONREACTIVE

## 2021-08-05 LAB — TYPE AND SCREEN
ABO/RH(D): A POS
Antibody Screen: NEGATIVE

## 2021-08-05 LAB — CULTURE, BETA STREP (GROUP B ONLY): Strep Gp B Culture: NEGATIVE

## 2021-08-05 MED ORDER — OXYTOCIN BOLUS FROM INFUSION
333.0000 mL | Freq: Once | INTRAVENOUS | Status: AC
  Administered 2021-08-05: 333 mL via INTRAVENOUS

## 2021-08-05 MED ORDER — LIDOCAINE-EPINEPHRINE (PF) 2 %-1:200000 IJ SOLN
INTRAMUSCULAR | Status: DC | PRN
  Administered 2021-08-05: 5 mL via EPIDURAL

## 2021-08-05 MED ORDER — PHENYLEPHRINE 40 MCG/ML (10ML) SYRINGE FOR IV PUSH (FOR BLOOD PRESSURE SUPPORT)
80.0000 ug | PREFILLED_SYRINGE | INTRAVENOUS | Status: DC | PRN
  Administered 2021-08-05: 80 ug via INTRAVENOUS

## 2021-08-05 MED ORDER — OXYTOCIN-SODIUM CHLORIDE 30-0.9 UT/500ML-% IV SOLN
1.0000 m[IU]/min | INTRAVENOUS | Status: DC
  Filled 2021-08-05: qty 500

## 2021-08-05 MED ORDER — SENNOSIDES-DOCUSATE SODIUM 8.6-50 MG PO TABS
2.0000 | ORAL_TABLET | Freq: Every day | ORAL | Status: DC
  Administered 2021-08-06: 2 via ORAL
  Filled 2021-08-05: qty 2

## 2021-08-05 MED ORDER — OXYCODONE-ACETAMINOPHEN 5-325 MG PO TABS: 1.0000 | ORAL_TABLET | ORAL | Status: DC | PRN

## 2021-08-05 MED ORDER — LACTATED RINGERS IV SOLN
500.0000 mL | INTRAVENOUS | Status: DC | PRN
  Administered 2021-08-05: 500 mL via INTRAVENOUS

## 2021-08-05 MED ORDER — SODIUM CHLORIDE 0.9 % IV SOLN
5.0000 10*6.[IU] | Freq: Once | INTRAVENOUS | Status: AC
  Administered 2021-08-05: 5 10*6.[IU] via INTRAVENOUS
  Filled 2021-08-05: qty 5

## 2021-08-05 MED ORDER — LIDOCAINE HCL (PF) 1 % IJ SOLN
30.0000 mL | INTRAMUSCULAR | Status: DC | PRN
Start: 2021-08-05 — End: 2021-08-05

## 2021-08-05 MED ORDER — PRENATAL MULTIVITAMIN CH
1.0000 | ORAL_TABLET | Freq: Every day | ORAL | Status: DC
  Administered 2021-08-05 – 2021-08-06 (×2): 1 via ORAL
  Filled 2021-08-05 (×2): qty 1

## 2021-08-05 MED ORDER — EPHEDRINE 5 MG/ML INJ: 10.0000 mg | INTRAVENOUS | Status: DC | PRN

## 2021-08-05 MED ORDER — PHENYLEPHRINE 40 MCG/ML (10ML) SYRINGE FOR IV PUSH (FOR BLOOD PRESSURE SUPPORT): 80.0000 ug | PREFILLED_SYRINGE | INTRAVENOUS | Status: DC | PRN

## 2021-08-05 MED ORDER — LACTATED RINGERS IV SOLN
500.0000 mL | Freq: Once | INTRAVENOUS | Status: AC
  Administered 2021-08-05: 500 mL via INTRAVENOUS

## 2021-08-05 MED ORDER — BENZOCAINE-MENTHOL 20-0.5 % EX AERO: 1.0000 "application " | INHALATION_SPRAY | CUTANEOUS | Status: DC | PRN

## 2021-08-05 MED ORDER — WITCH HAZEL-GLYCERIN EX PADS: 1.0000 "application " | MEDICATED_PAD | CUTANEOUS | Status: DC | PRN

## 2021-08-05 MED ORDER — OXYTOCIN-SODIUM CHLORIDE 30-0.9 UT/500ML-% IV SOLN
2.5000 [IU]/h | INTRAVENOUS | Status: DC
  Administered 2021-08-05: 2.5 [IU]/h via INTRAVENOUS

## 2021-08-05 MED ORDER — SIMETHICONE 80 MG PO CHEW: 80.0000 mg | CHEWABLE_TABLET | ORAL | Status: DC | PRN

## 2021-08-05 MED ORDER — ACETAMINOPHEN 325 MG PO TABS: 650.0000 mg | ORAL_TABLET | ORAL | Status: DC | PRN

## 2021-08-05 MED ORDER — FENTANYL-BUPIVACAINE-NACL 0.5-0.125-0.9 MG/250ML-% EP SOLN
12.0000 mL/h | EPIDURAL | Status: DC | PRN
  Administered 2021-08-05: 12 mL/h via EPIDURAL
  Filled 2021-08-05: qty 250

## 2021-08-05 MED ORDER — LACTATED RINGERS IV SOLN: 500.0000 mL | Freq: Once | INTRAVENOUS | Status: DC

## 2021-08-05 MED ORDER — IBUPROFEN 600 MG PO TABS
600.0000 mg | ORAL_TABLET | Freq: Four times a day (QID) | ORAL | Status: DC
  Administered 2021-08-05 – 2021-08-07 (×8): 600 mg via ORAL
  Filled 2021-08-05 (×8): qty 1

## 2021-08-05 MED ORDER — DIPHENHYDRAMINE HCL 25 MG PO CAPS: 25.0000 mg | ORAL_CAPSULE | Freq: Four times a day (QID) | ORAL | Status: DC | PRN

## 2021-08-05 MED ORDER — SOD CITRATE-CITRIC ACID 500-334 MG/5ML PO SOLN: 30.0000 mL | ORAL | Status: DC | PRN

## 2021-08-05 MED ORDER — ONDANSETRON HCL 4 MG PO TABS: 4.0000 mg | ORAL_TABLET | ORAL | Status: DC | PRN

## 2021-08-05 MED ORDER — DIPHENHYDRAMINE HCL 50 MG/ML IJ SOLN: 12.5000 mg | INTRAMUSCULAR | Status: DC | PRN

## 2021-08-05 MED ORDER — OXYCODONE-ACETAMINOPHEN 5-325 MG PO TABS: 2.0000 | ORAL_TABLET | ORAL | Status: DC | PRN

## 2021-08-05 MED ORDER — ONDANSETRON HCL 4 MG/2ML IJ SOLN
4.0000 mg | INTRAMUSCULAR | Status: DC | PRN
Start: 2021-08-05 — End: 2021-08-07

## 2021-08-05 MED ORDER — PENICILLIN G POT IN DEXTROSE 60000 UNIT/ML IV SOLN: 3.0000 10*6.[IU] | INTRAVENOUS | Status: DC

## 2021-08-05 MED ORDER — LACTATED RINGERS IV SOLN: INTRAVENOUS | Status: DC

## 2021-08-05 MED ORDER — ONDANSETRON HCL 4 MG/2ML IJ SOLN
4.0000 mg | Freq: Four times a day (QID) | INTRAMUSCULAR | Status: DC | PRN
  Administered 2021-08-05: 4 mg via INTRAVENOUS
  Filled 2021-08-05: qty 2

## 2021-08-05 MED ORDER — FENTANYL-BUPIVACAINE-NACL 0.5-0.125-0.9 MG/250ML-% EP SOLN: 12.0000 mL/h | EPIDURAL | Status: DC | PRN

## 2021-08-05 MED ORDER — ACETAMINOPHEN 325 MG PO TABS
650.0000 mg | ORAL_TABLET | ORAL | Status: DC | PRN
  Administered 2021-08-05 – 2021-08-07 (×4): 650 mg via ORAL
  Filled 2021-08-05 (×4): qty 2

## 2021-08-05 MED ORDER — DIBUCAINE (PERIANAL) 1 % EX OINT: 1.0000 "application " | TOPICAL_OINTMENT | CUTANEOUS | Status: DC | PRN

## 2021-08-05 MED ORDER — PHENYLEPHRINE 40 MCG/ML (10ML) SYRINGE FOR IV PUSH (FOR BLOOD PRESSURE SUPPORT)
80.0000 ug | PREFILLED_SYRINGE | INTRAVENOUS | Status: DC | PRN
  Filled 2021-08-05: qty 10

## 2021-08-05 MED ORDER — COCONUT OIL OIL: 1.0000 "application " | TOPICAL_OIL | Status: DC | PRN

## 2021-08-05 MED ORDER — TERBUTALINE SULFATE 1 MG/ML IJ SOLN: 0.2500 mg | Freq: Once | INTRAMUSCULAR | Status: DC | PRN

## 2021-08-05 NOTE — Lactation Note (Signed)
This note was copied from a baby's chart. ?Lactation Consultation Note ? ?Patient Name: Shelby Obrien ?Today's Date: 08/05/2021 ?Reason for consult: L&D Initial assessment;Late-preterm 34-36.6wks;Breastfeeding assistance;Other (Comment) (LC LD visit less than 60 minsPP, Baby STS on moms chest when [facility] the room. LC offered to assist to latch and mom receptive. Baby latched for a few minutes on and off. Baby stuffy.) ?Age: 32 mins PP,  ?LC 6  ?Mom aware LC will F/U today.  ? ?Maternal Data ?  ? ?Feeding ?Mother's Current Feeding Choice: Breast Milk and Formula ? ?LATCH Score ?Latch: Repeated attempts needed to sustain latch, nipple held in mouth throughout feeding, stimulation needed to elicit sucking reflex. ? ?Audible Swallowing: None ? ?Type of Nipple: Everted at rest and after stimulation ? ?Comfort (Breast/Nipple): Soft / non-tender ? ?Hold (Positioning): Assistance needed to correctly position infant at breast and maintain latch. ? ?LATCH Score: 6 ? ? ?Lactation Tools Discussed/Used ?  ? ?Interventions ?Interventions: Breast feeding basics reviewed;Assisted with latch;Skin to skin;Reverse pressure ? ?Discharge ?  ? ?Consult Status ?Consult Status: Follow-up from L&D ?Date: 08/05/21 ?Follow-up type: In-patient ? ? ? ?Matilde Sprang Worthy Boschert ?08/05/2021, 10:22 AM ? ? ? ?

## 2021-08-05 NOTE — MAU Note (Signed)
External monitors removed for transfer to LD via WC 

## 2021-08-05 NOTE — Discharge Summary (Signed)
? ?  Postpartum Discharge Summary ? ?Date of Service updated ? ?   ?Patient Name: Shelby Obrien ?DOB: 1990-12-02 ?MRN: [id number] ? ?Date of admission: 08/05/2021 ?Delivery date:08/05/2021  ?Delivering provider: Genia Del  ?Date of discharge: 08/07/2021 ? ?Admitting diagnosis: Indication for care in labor or delivery [O75.9] ?Intrauterine pregnancy: [redacted]w[redacted]d    ?Secondary diagnosis:  Principal Problem: ?  Vaginal delivery ?Active Problems: ?  Maternal obesity affecting pregnancy, antepartum ?  Supervision of high risk pregnancy, antepartum ?  Symptomatic anemia ?  Preterm premature rupture of membranes (PPROM) with onset of labor within 24 hours of rupture in third trimester, antepartum ?  Preterm labor in third trimester with preterm delivery ? ?Additional problems: None    ?Discharge diagnosis: Preterm Pregnancy Delivered                                              ?Post partum procedures: None ?Augmentation: Pitocin ?Complications: None ? ?Hospital course: Induction of Labor With Vaginal Delivery   ?31y.o. yo [id number] admitted to the hospital 08/05/2021 for induction of labor.  Indication for induction:  PPROM .  Patient had an uncomplicated labor course and progressed to complete after Pitocin. She had an uncomplicated vaginal delivery.  ?Membrane Rupture Time/Date: 1:00 AM ,08/05/2021   ?Delivery Method:Vaginal, Spontaneous  ?Episiotomy: None  ?Lacerations:  None  ?Details of delivery can be found in separate delivery note.  Patient had a routine postpartum course.  Patient is discharged home 08/07/21. ? ?Newborn Data: ?Birth date:08/05/2021  ?Birth time:9:13 AM  ?Gender:Female  ?Living status:Living  ?Apgars:9 ,9  ?Weight:2866 g  ? ?Magnesium Sulfate received: No ?BMZ received: No ?Rhophylac: N/A ?MMR: N/A ?T-DaP: Given prenatally ?Flu: Given prenatally  ?Transfusion: No  ? ?Physical exam  ?Vitals:  ? 08/06/21 0608 08/06/21 1507 08/06/21 2230 08/07/21 0639  ?BP: 121/83 135/80 120/88  128/78  ?Pulse: 78 82 88   ?Resp: _0 ?Temp: 98 ?F (36.7 ?C) 98 ?F (36.7 ?C) 98.8 ?F (37.1 ?C) 98.5 ?F (36.9 ?C)  ?TempSrc: Oral Oral Oral Oral  ?SpO2: 100% 99%  100%  ?Weight:      ?Height:      ? ?General: alert, cooperative, and no distress ?Lochia: appropriate ?Uterine Fundus: firm ?Incision: N/A ?DVT Evaluation: No evidence of DVT seen on physical exam. ? ?Labs: ?Lab Results  ?Component Value Date  ? WBC 6.6 08/05/2021  ? HGB 10.0 (L) 08/05/2021  ? HCT 31.5 (L) 08/05/2021  ? MCV 84.7 08/05/2021  ? PLT 192 08/05/2021  ? ? ?  Latest Ref Rng & Units 08/05/2021  ?  2:08 AM  ?CMP  ?Glucose 70 - 99 mg/dL 93    ?BUN 6 - 20 mg/dL 6    ?Creatinine 0.44 - 1.00 mg/dL 0.71    ?Sodium 135 - 145 mmol/L 135    ?Potassium 3.5 - 5.1 mmol/L 4.0    ?Chloride 98 - 111 mmol/L 108    ?CO2 22 - 32 mmol/L 20    ?Calcium 8.9 - 10.3 mg/dL 8.7    ?Total Protein 6.5 - 8.1 g/dL 5.8    ?Total Bilirubin 0.3 - 1.2 mg/dL 0.7    ?Alkaline Phos 38 - 126 U/L 98    ?AST 15 - 41 U/L 18    ?ALT 0 - 44 U/L 11    ? ?  Edinburgh Score: ? ?  08/05/2021  ?  8:40 PM  ?Edinburgh Postnatal Depression Scale Screening Tool  ?I have been able to laugh and see the funny side of things. 0  ?I have looked forward with enjoyment to things. 0  ?I have blamed myself unnecessarily when things went wrong. 2  ?I have been anxious or worried for no good reason. 1  ?I have felt scared or panicky for no good reason. 1  ?Things have been getting on top of me. 1  ?I have been so unhappy that I have had difficulty sleeping. 0  ?I have felt sad or miserable. 0  ?I have been so unhappy that I have been crying. 0  ?The thought of harming myself has occurred to me. 0  ?Edinburgh Postnatal Depression Scale Total 5  ? ? ? ?After visit meds:  ?Allergies as of 08/07/2021   ? ?   Reactions  ? Shellfish Allergy Anaphylaxis  ? Banana   ? Blueberry Flavor   ? Other   ? BLUEBERRIES, BANANAS, TREE NUTS, RASPBERRIES, GRAPES, POLLENS/SEASONAL.  ? Raspberry   ? ?  ? ?  ?Medication List   ?  ? ?TAKE these medications   ? ?acetaminophen 500 MG tablet ?Commonly known as: TYLENOL ?Take 2 tablets (1,000 mg total) by mouth every 6 (six) hours as needed for fever or headache. ?  ?albuterol 108 (90 Base) MCG/ACT inhaler ?Commonly known as: VENTOLIN HFA ?Inhale 2 puffs into the lungs every 6 (six) hours as needed for wheezing or shortness of breath. ?  ?cyclobenzaprine 10 MG tablet ?Commonly known as: FLEXERIL ?Take 1 tablet (10 mg total) by mouth 3 (three) times daily as needed for muscle spasms. ?  ?prenatal multivitamin Tabs tablet ?Take 1 tablet by mouth daily at 12 noon. ?  ? ?  ? ? ? ?Discharge home in stable condition ?Infant Feeding: Breast ?Infant Disposition: home with mother ?Discharge instruction: per After Visit Summary and Postpartum booklet. ?Activity: Advance as tolerated. Pelvic rest for 6 weeks.  ?Diet: routine diet ?Future Appointments: ?Future Appointments  ?Date Time Provider [place]  ?08/16/2021  2:00 PM [facility] None  ?08/16/2021  2:30 PM Verlon Au, NP [facility] None  ?08/16/2021  3:00 PM [facility] INFUSION CHAIR 2 [facility] None  ?09/16/2021  2:15 PM Imagene Riches, CNM [facility] None  ? ?Follow up Visit: ?Message sent to [facility] by Dr. Gwenlyn Perking on 08/05/21.  ? ?Please schedule this patient for a In person postpartum visit in 6 weeks with the following provider: Any provider. ?Additional Postpartum F/U:  None   ?High risk pregnancy complicated by:  Obesity ?Delivery mode:  Vaginal, Spontaneous  ?Anticipated Birth Control:  Unsure ? ?08/07/2021 ?Starr Lake, CNM ? ? ? ?

## 2021-08-05 NOTE — Anesthesia Preprocedure Evaluation (Addendum)
Anesthesia Evaluation  ?Patient identified by MRN, date of birth, ID band ?Patient awake ? ? ? ?Reviewed: ?Allergy & Precautions, Patient's Chart, lab work & pertinent test results ? ?Airway ?Mallampati: III ? ? ? ? ? ? Dental ?no notable dental hx. ? ?  ?Pulmonary ?asthma , former smoker,  ?  ?Pulmonary exam normal ? ? ? ? ? ? ? Cardiovascular ?negative cardio ROS ?Normal cardiovascular exam ? ? ?  ?Neuro/Psych ? Headaches, PSYCHIATRIC DISORDERS Anxiety Depression   ? GI/Hepatic ?Neg liver ROS,   ?Endo/Other  ?negative endocrine ROS ? Renal/GU ?negative Renal ROS  ? ?  ?Musculoskeletal ? ? Abdominal ?  ?Peds ? Hematology ?  ?Anesthesia Other Findings ? ? Reproductive/Obstetrics ?(+) Pregnancy ? ?  ? ? ? ? ? ? ? ? ? ? ? ? ? ?  ?  ? ? ? ? ? ? ? ?Anesthesia Physical ?Anesthesia Plan ? ?ASA: 3 ? ?Anesthesia Plan: Epidural  ? ?Post-op Pain Management:   ? ?Induction:  ? ?PONV Risk Score and Plan: 0 ? ?Airway Management Planned: Natural Airway ? ?Additional Equipment: None ? ?Intra-op Plan:  ? ?Post-operative Plan:  ? ?Informed Consent: I have reviewed the patients History and Physical, chart, labs and discussed the procedure including the risks, benefits and alternatives for the proposed anesthesia with the patient or authorized representative who has indicated his/her understanding and acceptance.  ? ? ? ? ? ?Plan Discussed with:  ? ?Anesthesia Plan Comments: (Lab Results ?     Component                Value               Date                 ?     WBC                      6.6                 08/05/2021           ?     HGB                      10.0 (L)            08/05/2021           ?     HCT                      31.5 (L)            08/05/2021           ?     MCV                      84.7                08/05/2021           ?     PLT                      192                 08/05/2021           ?)  ? ? ? ? ? ? ?Anesthesia Quick Evaluation ? ?
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
## 2021-08-05 NOTE — MAU Note (Signed)
.  Shelby Obrien is a 31 y.o. at [redacted]w[redacted]d here in MAU reporting: her water broke about 30 min ago. Clear fluid. Has been having ctx on and off for the past few weeks. Good fetal movement felt. 2cm on last exam ? ? ?Onset of complaint: 30 min ?Pain score: 4 ?There were no vitals filed for this visit.   ? ?Lab orders placed from triage:    ?

## 2021-08-05 NOTE — Lactation Note (Signed)
This note was copied from a baby's chart. ?Lactation Consultation Note ? ?Patient Name: Shelby Obrien ?Today's Date: 08/05/2021 ?Reason for consult: Initial assessment;Late-preterm 34-36.6wks ?Age:32 hours ? ? ?P3 mother whose infant is now 77 hours old.  This is a LPTI at 36+2 weeks weighing > 6 lbs.  Mother has breast feeding experience with the most recent being 4 months with her youngest child.  Her current feeding preference is breast/formula. ? ?"Jacquenette Shone" has breast fed twice and supplemented approximately 2 hours ago with Similac 22 calorie formula, 12 mls.  Mother had no questions/concerns at this time.  Encouraged to feed on cue or at least every three hours due to gestational age.  She is familiar with hand expression.  Suggested lots of STS and hand expression before/after feedings to help ensure a good milk supply. ? ?RN had already set up the DEBP; no review needed.  Demonstrated the manual pump for mother.  She will post pump after the next feeding and every three hours.  Mother will call for latch assistance as needed.  Grandmother present. ? ? ?Maternal Data ?Has patient been taught Hand Expression?: Yes ?Does the patient have breastfeeding experience prior to this delivery?: Yes ?How long did the patient breastfeed?: Couple of weeks with first child and few months with the second child ? ?Feeding ?Mother's Current Feeding Choice: Breast Milk and Formula ?Nipple Type: Slow - flow ? ?LATCH Score ?  ? ?  ? ?  ? ?  ? ?  ? ?  ? ? ?Lactation Tools Discussed/Used ?Tools: Pump;Flanges ?Flange Size: 24;27 ?Breast pump type: Double-Electric Breast Pump;Manual ?Pump Education: Setup, frequency, and cleaning (Reviewed) ?Reason for Pumping: Breast stimulation for supplementation ?Pumping frequency: Every three hours ? ?Interventions ?Interventions: Breast feeding basics reviewed;Education ? ?Discharge ?Pump: DEBP;Manual;Personal ? ?Consult Status ?Consult Status: Follow-up ?Date: 08/06/21 ?Follow-up type:  In-patient ? ? ? ?Zayneb Baucum R Mills Mitton ?08/05/2021, 3:44 PM ? ? ? ?

## 2021-08-05 NOTE — Anesthesia Procedure Notes (Signed)
Epidural ?Patient location during procedure: OB ?Start time: 08/05/2021 5:55 AM ?End time: 08/05/2021 6:00 AM ? ?Staffing ?Anesthesiologist: Shelton Silvas, MD ?Performed: anesthesiologist  ? ?Preanesthetic Checklist ?Completed: patient identified, IV checked, site marked, risks and benefits discussed, surgical consent, monitors and equipment checked, pre-op evaluation and timeout performed ? ?Epidural ?Patient position: sitting ?Prep: DuraPrep ?Patient monitoring: heart rate, continuous pulse ox and blood pressure ?Approach: midline ?Location: L3-L4 ?Injection technique: LOR saline ? ?Needle:  ?Needle type: Tuohy  ?Needle gauge: 17 G ?Needle length: 9 cm ?Catheter type: closed end flexible ?Catheter size: 20 Guage ?Test dose: negative and 1.5% lidocaine ? ?Assessment ?Events: blood not aspirated, injection not painful, no injection resistance and no paresthesia ? ?Additional Notes ?LOR @ 6 ? ? ?Patient identified. Risks/Benefits/Options discussed with patient including but not limited to bleeding, infection, nerve damage, paralysis, failed block, incomplete pain control, headache, blood pressure changes, nausea, vomiting, reactions to medications, itching and postpartum back pain. Confirmed with bedside nurse the patient's most recent platelet count. Confirmed with patient that they are not currently taking any anticoagulation, have any bleeding history or any family history of bleeding disorders. Patient expressed understanding and wished to proceed. All questions were answered. Sterile technique was used throughout the entire procedure. Please see nursing notes for vital signs. Test dose was given through epidural catheter and negative prior to continuing to dose epidural or start infusion. Warning signs of high block given to the patient including shortness of breath, tingling/numbness in hands, complete motor block, or any concerning symptoms with instructions to call for help. Patient was given instructions on  fall risk and not to get out of bed. All questions and concerns addressed with instructions to call with any issues or inadequate analgesia.   ? ?Reason for block:procedure for pain ? ? ? ?

## 2021-08-05 NOTE — Anesthesia Postprocedure Evaluation (Signed)
Anesthesia Post Note ? ?Patient: Shelby Obrien ? ?Procedure(s) Performed: AN AD HOC LABOR EPIDURAL ? ?  ? ?Patient location during evaluation: Mother Baby ?Anesthesia Type: Epidural ?Level of consciousness: awake and alert ?Pain management: pain level controlled ?Vital Signs Assessment: post-procedure vital signs reviewed and stable ?Respiratory status: spontaneous breathing, nonlabored ventilation and respiratory function stable ?Cardiovascular status: stable ?Postop Assessment: no headache, no backache and epidural receding ?Anesthetic complications: no ? ? ?No notable events documented. ? ?Last Vitals:  ?Vitals:  ? 08/05/21 1215 08/05/21 1630  ?BP: 108/68 120/64  ?Pulse: 71 75  ?Resp: 18 18  ?Temp: (!) 36.3 ?C 36.7 ?C  ?  ?Last Pain:  ?Vitals:  ? 08/05/21 1630  ?TempSrc: Oral  ?PainSc: 5   ? ?Pain Goal: Patients Stated Pain Goal: 3 (08/05/21 1500) ? ?  ?  ?  ?  ?  ?  ?Epidural/Spinal Function Cutaneous sensation: Normal sensation (08/05/21 1630), Patient able to flex knees: Yes (08/05/21 1630), Patient able to lift hips off bed: Yes (08/05/21 1630), Back pain beyond tenderness at insertion site: No (08/05/21 1630), Progressively worsening motor and/or sensory loss: No (08/05/21 1630), Bowel and/or bladder incontinence post epidural: No (08/05/21 1630) ? ?Shelby Obrien ? ? ? ? ?

## 2021-08-06 MED ORDER — OXYCODONE HCL 5 MG PO TABS
5.0000 mg | ORAL_TABLET | Freq: Four times a day (QID) | ORAL | Status: DC | PRN
Start: 2021-08-06 — End: 2021-08-07

## 2021-08-06 NOTE — Progress Notes (Addendum)
Patient ID: Shelby Obrien, female   DOB: [birth year], 31 y.o.   MRN: [id number] ? ?POSTPARTUM PROGRESS NOTE ? ?Post Partum Day 1 ? ?Subjective: ?Shelby Obrien is a 31 y.o. [phone] s/p SVD at [redacted]w[redacted]d.  She reports she is doing well. No acute events overnight. She denies any problems with ambulating, voiding or po intake. Denies nausea or vomiting.  Pain is poorly controlled. Patient states ibuprofen helped in prior pregnancies. Now somewhat helpful.  Lochia is appropriate. ? ?Objective: ?Blood pressure 121/83, pulse 78, temperature 98 ?F (36.7 ?C), temperature source Oral, resp. rate 18, height 5\' 4"  (1.626 m), weight 125.2 kg, last menstrual period 11/24/2020, SpO2 100 %, unknown if currently breastfeeding. ? ?Physical Exam:  ?General: alert, cooperative and no distress ?Chest: no respiratory distress ?Heart:regular rate, distal pulses intact ?Uterine Fundus: firm, appropriately tender ?DVT Evaluation: No calf swelling or tenderness ?Extremities: mild bilateral non-pitting edema ?Skin: warm, dry ? ?Recent Labs  ?  08/05/21 ?0208  ?HGB 10.0*  ?HCT 31.5*  ? ? ?Assessment/Plan: ?Shelby Obrien is a 31 y.o. [phone] s/p SVD at [redacted]w[redacted]d  ? ?PPD#1 - Doing well ? Routine postpartum care. Will add PRN Oxycodone for pain management. ?Contraception: unsure ?Feeding: breast and bottle ?Dispo: Plan for discharge on PPD#2 08/07/21 as infant is 36 weeks and needs to stay ? ? LOS: 1 day  ? ?08/06/2021, 7:52 AM  ? ?GME ATTESTATION:  ?I saw and evaluated the patient. I agree with the findings and the plan of care as documented in the student's note and have made all necessary edits. ? ?10/06/2021, MD, MPH ?OB Fellow, Faculty Practice ?[place] ?08/06/2021 8:55 AM ? ? ? ?

## 2021-08-06 NOTE — Social Work (Signed)
CSW received consult for hx of Anxiety and Depression.  CSW met with MOB to offer support and complete assessment.   ? ?CSW met with MOB at bedside and introduced the CSW role. CSW observed MOB up in the room, FOB at bedside and the infant asleep in the bassinet. CSW offered MOB privacy. MOB gave CSW permission to share information in front of FOB. MOB identified FOB her support. MOB presented pleasant and welcomed CSW visit. CSW inquired how MOB has felt since giving birth. MOB reported that she has been feeling good and that the delivery went well. CSW inquired about MOB history of anxiety and depression. MOB reported she that she does not currently have concerns for anxiety and depression. MOB reported she was diagnosed with depression and anxiety in 2019 while pregnant with her second child. MOB reported she was going through a separation with her child's father which contributed to stress and emotions. MOB reported she longer has those concerns. CSW discussed PPD. MOB was receptive to the resources provided. CSW assessed MOB for safety. MOB denied thought about harm to self and others.  ? ?CSW provided education regarding the baby blues period vs. perinatal mood disorders, discussed treatment and gave resources for mental health follow up if concerns arise.  CSW recommend MOB complete a self-evaluation during the postpartum time period using the New Mom Checklist from Postpartum Progress and encouraged MOB to contact a medical professional if symptoms are noted at any time. MOB reported she feels comfortable reaching out to her doctor if she has concerns.  ? ?CSW provided review of Sudden Infant Death Syndrome (SIDS) precautions.  MOB reported she has essential items for the infant including a bassinet where the infant will sleep. MOB has chosen [facility] in [place], [place]. MOB asked in the infant can receive Medicaid since her older children receive benefits. CSW reached out to the financial  counselor and notified her of MOB request. CSW offered community resources. MOB reported she used [facility] services with her oldest child and gave CSW permission to make a referral to [facility].  ? ?CSW identifies no further need for intervention and no barriers to discharge at this time. ? ? , MSW, LCSW ?[facility]  ?Clinical Social Worker  ?[phone] ?08/06/2021  1:24 PM  ?

## 2021-08-07 ENCOUNTER — Ambulatory Visit: Payer: BC Managed Care – PPO

## 2021-08-07 MED ORDER — MEDROXYPROGESTERONE ACETATE 150 MG/ML IM SUSP
150.0000 mg | Freq: Once | INTRAMUSCULAR | Status: AC
  Administered 2021-08-07: 150 mg via INTRAMUSCULAR
  Filled 2021-08-07: qty 1

## 2021-08-07 NOTE — Lactation Note (Addendum)
This note was copied from a baby's chart. ?Lactation Consultation Note ? ?Patient Name: Boy Jasminne Mealy ?Today's Date: 08/07/2021 ?Reason for consult: Follow-up assessment ?Age:32 hours ? ?P3, Encouraged mother to continue offering breast and supplementing after increasing volume per day of life and as baby desired.  Currently baby is taking 55 ml formula and having occasional short feeds (approx 10 min) at breast. Mother aware to wake baby for feeds if needed.  Reviewed engorgement care and monitoring voids/stools. ? ? ?Feeding ?Mother's Current Feeding Choice: Breast Milk and Formula ?Nipple Type: Regular ? ?LLactation Tools Discussed/Used ?Tools:  (Sister plans to purchase DEBP gift) ? ?Interventions ?Interventions: Education ? ?Discharge ?Discharge Education: Engorgement and breast care;Warning signs for feeding baby ? ?Consult Status ?Consult Status: Complete ? ? ? ?Dahlia Byes Boschen ?08/07/2021, 11:30 AM ? ? ? ?

## 2021-08-09 ENCOUNTER — Encounter: Payer: BC Managed Care – PPO | Admitting: Licensed Practical Nurse

## 2021-08-11 ENCOUNTER — Encounter: Payer: Self-pay | Admitting: Emergency Medicine

## 2021-08-11 ENCOUNTER — Encounter: Payer: Self-pay | Admitting: Family Medicine

## 2021-08-11 ENCOUNTER — Telehealth: Payer: BC Managed Care – PPO | Admitting: Emergency Medicine

## 2021-08-11 ENCOUNTER — Other Ambulatory Visit: Payer: Self-pay | Admitting: Family

## 2021-08-11 DIAGNOSIS — J4521 Mild intermittent asthma with (acute) exacerbation: Secondary | ICD-10-CM

## 2021-08-11 MED ORDER — ALBUTEROL SULFATE HFA 108 (90 BASE) MCG/ACT IN AERS: 2.0000 | INHALATION_SPRAY | Freq: Four times a day (QID) | RESPIRATORY_TRACT | 0 refills | Status: DC | PRN

## 2021-08-11 MED ORDER — PREDNISONE 20 MG PO TABS: 20.0000 mg | ORAL_TABLET | Freq: Two times a day (BID) | ORAL | 0 refills | Status: AC

## 2021-08-11 NOTE — Progress Notes (Signed)
I have spent 5 minutes in review of e-visit questionnaire, review and updating patient chart, medical decision making and response to patient.   Derwood Becraft, PA-C    

## 2021-08-11 NOTE — Addendum Note (Signed)
Addended by: Margaretann Loveless on: 08/11/2021 06:08 PM ? ? Modules accepted: Orders ? ?

## 2021-08-11 NOTE — Progress Notes (Signed)
Visit for Asthma  Based on what you have shared with me, it looks like you may have a flare up of your asthma.  Asthma is a chronic (ongoing) lung disease which results in airway obstruction, inflammation and hyper-responsiveness.   Asthma symptoms vary from person to person, with common symptoms including nighttime awakening and decreased ability to participate in normal activities as a result of shortness of breath. It is often triggered by changes in weather, changes in the season, changes in air temperature, or inside (home, school, daycare or work) allergens such as animal dander, mold, mildew, woodstoves or cockroaches.   It can also be triggered by hormonal changes, extreme emotion, physical exertion or an upper respiratory tract illness.     It is important to identify the trigger, and then eliminate or avoid the trigger if possible.   If you have been prescribed medications to be taken on a regular basis, it is important to follow the asthma action plan and to follow guidelines to adjust medication in response to increasing symptoms of decreased peak expiratory flow rate  Treatment: I have prescribed: Prednisone 40mg by mouth per day for 5 - 7 days  HOME CARE Only take medications as instructed by your medical team. Consider wearing a mask or scarf to improve breathing air temperature have been shown to decrease irritation and decrease exacerbations Get rest. Taking a steamy shower or using a humidifier may help nasal congestion sand ease sore throat pain. You can place a towel over your head and breathe in the steam from hot water coming from a faucet. Using a saline nasal spray works much the same way.  Cough drops, hare candies and sore throat lozenges may ease your cough.  Avoid close contacts especially the very you and the elderly Cover your mouth if you cough or  sneeze Always remember to wash your hands.    GET HELP RIGHT AWAY IF: You develop worsening symptoms; breathlessness at rest, drowsy, confused or agitated, unable to speak in full sentences You have coughing fits You develop a severe headache or visual changes You develop shortness of breath, difficulty breathing or start having chest pain Your symptoms persist after you have completed your treatment plan If your symptoms do not improve within 10 days  MAKE SURE YOU Understand these instructions. Will watch your condition. Will get help right away if you are not doing well or get worse.   Your e-visit answers were reviewed by a board certified advanced clinical practitioner to complete your personal care plan, Depending upon the condition, your plan could have included both over the counter or prescription medications.   Please review your pharmacy choice. Your safety is important to us. If you have drug allergies check your prescription carefully.  You can use MyChart to ask questions about today's visit, request a non-urgent  call back, or ask for a work or school excuse for 24 hours related to this e-Visit. If it has been greater than 24 hours you will need to follow up with your provider, or enter a new e-Visit to address those concerns.   You will get an e-mail in the next two days asking about your experience. I hope that your e-visit has been valuable and will speed your recovery. Thank you for using e-visits.  

## 2021-08-13 ENCOUNTER — Ambulatory Visit: Payer: BC Managed Care – PPO

## 2021-08-15 ENCOUNTER — Telehealth: Payer: Self-pay | Admitting: *Deleted

## 2021-08-15 ENCOUNTER — Other Ambulatory Visit: Payer: Self-pay

## 2021-08-15 DIAGNOSIS — D649 Anemia, unspecified: Secondary | ICD-10-CM

## 2021-08-15 NOTE — Telephone Encounter (Signed)
Call placed to patient at request of Shelby Masse, NP to see if she would like to keep appointments as scheduled tomorrow given that delivery was recent on 4/10. Patient would like to reschedule appointments for a later date. Nurse offered to reschedule appointments but patient would like to call to reschedule. Appointments cancelled.  ?

## 2021-08-16 ENCOUNTER — Inpatient Hospital Stay: Payer: BC Managed Care – PPO | Admitting: Nurse Practitioner

## 2021-08-16 ENCOUNTER — Inpatient Hospital Stay: Payer: BC Managed Care – PPO

## 2021-08-16 ENCOUNTER — Telehealth (HOSPITAL_COMMUNITY): Payer: Self-pay | Admitting: *Deleted

## 2021-08-16 NOTE — Telephone Encounter (Signed)
Left phone voicemail message. ? ?Duffy Rhody, RN 08-16-2021 at 1:40pm ?

## 2021-08-19 ENCOUNTER — Other Ambulatory Visit: Payer: Self-pay

## 2021-08-19 DIAGNOSIS — J4521 Mild intermittent asthma with (acute) exacerbation: Secondary | ICD-10-CM

## 2021-08-19 MED ORDER — ALBUTEROL SULFATE HFA 108 (90 BASE) MCG/ACT IN AERS: 2.0000 | INHALATION_SPRAY | Freq: Four times a day (QID) | RESPIRATORY_TRACT | 9 refills | Status: DC | PRN

## 2021-09-05 ENCOUNTER — Other Ambulatory Visit: Payer: Self-pay

## 2021-09-05 ENCOUNTER — Emergency Department (HOSPITAL_BASED_OUTPATIENT_CLINIC_OR_DEPARTMENT_OTHER)
Admit: 2021-09-05 | Discharge: 2021-09-05 | Disposition: A | Discharge status: Home / Self Care | Payer: BC Managed Care – PPO | Attending: Emergency Medicine | Admitting: Emergency Medicine

## 2021-09-05 ENCOUNTER — Encounter (HOSPITAL_COMMUNITY): Payer: Self-pay | Admitting: Emergency Medicine

## 2021-09-05 ENCOUNTER — Emergency Department (HOSPITAL_COMMUNITY)
Admission: EM | Admit: 2021-09-05 | Discharge: 2021-09-05 | Disposition: A | Discharge status: Home / Self Care | Payer: BC Managed Care – PPO | Attending: Emergency Medicine | Admitting: Emergency Medicine

## 2021-09-05 ENCOUNTER — Emergency Department (HOSPITAL_COMMUNITY): Payer: BC Managed Care – PPO

## 2021-09-05 DIAGNOSIS — R2243 Localized swelling, mass and lump, lower limb, bilateral: Secondary | ICD-10-CM | POA: Diagnosis not present

## 2021-09-05 DIAGNOSIS — R609 Edema, unspecified: Secondary | ICD-10-CM

## 2021-09-05 DIAGNOSIS — R079 Chest pain, unspecified: Secondary | ICD-10-CM | POA: Diagnosis not present

## 2021-09-05 DIAGNOSIS — M7989 Other specified soft tissue disorders: Secondary | ICD-10-CM | POA: Diagnosis not present

## 2021-09-05 DIAGNOSIS — R0602 Shortness of breath: Secondary | ICD-10-CM | POA: Insufficient documentation

## 2021-09-05 DIAGNOSIS — I10 Essential (primary) hypertension: Secondary | ICD-10-CM | POA: Insufficient documentation

## 2021-09-05 DIAGNOSIS — R42 Dizziness and giddiness: Secondary | ICD-10-CM | POA: Diagnosis not present

## 2021-09-05 DIAGNOSIS — Z79899 Other long term (current) drug therapy: Secondary | ICD-10-CM | POA: Diagnosis not present

## 2021-09-05 DIAGNOSIS — R0600 Dyspnea, unspecified: Secondary | ICD-10-CM | POA: Diagnosis not present

## 2021-09-05 DIAGNOSIS — D72829 Elevated white blood cell count, unspecified: Secondary | ICD-10-CM | POA: Insufficient documentation

## 2021-09-05 LAB — BASIC METABOLIC PANEL
Anion gap: 9 (ref 5–15)
BUN: 15 mg/dL (ref 6–20)
CO2: 25 mmol/L (ref 22–32)
Calcium: 8.9 mg/dL (ref 8.9–10.3)
Chloride: 105 mmol/L (ref 98–111)
Creatinine, Ser: 1.04 mg/dL — ABNORMAL HIGH (ref 0.44–1.00)
GFR, Estimated: >60 mL/min (ref >60)
Glucose, Bld: 93 mg/dL (ref 70–99)
Potassium: 3.7 mmol/L (ref 3.5–5.1)
Sodium: 139 mmol/L (ref 135–145)

## 2021-09-05 LAB — I-STAT BETA HCG BLOOD, ED (NOT ORDERABLE): I-stat hCG, quantitative: <5 m[IU]/mL (ref <5)

## 2021-09-05 LAB — HEPATIC FUNCTION PANEL
ALT: 17 U/L (ref 0–44)
AST: 32 U/L (ref 15–41)
Albumin: 3.4 g/dL — ABNORMAL LOW (ref 3.5–5.0)
Alkaline Phosphatase: 89 U/L (ref 38–126)
Bilirubin, Direct: 0.4 mg/dL — ABNORMAL HIGH (ref 0.0–0.2)
Indirect Bilirubin: 0.6 mg/dL (ref 0.3–0.9)
Total Bilirubin: 1 mg/dL (ref 0.3–1.2)
Total Protein: 6.5 g/dL (ref 6.5–8.1)

## 2021-09-05 LAB — CBG MONITORING, ED: Glucose-Capillary: 96 mg/dL (ref 70–99)

## 2021-09-05 LAB — URINALYSIS, ROUTINE W REFLEX MICROSCOPIC
Bilirubin Urine: NEGATIVE
Glucose, UA: NEGATIVE mg/dL
Ketones, ur: NEGATIVE mg/dL
Nitrite: NEGATIVE
Protein, ur: NEGATIVE mg/dL
Specific Gravity, Urine: 1.01 (ref 1.005–1.030)
pH: 7 (ref 5.0–8.0)

## 2021-09-05 LAB — CBC
HCT: 37.6 % (ref 36.0–46.0)
Hemoglobin: 11.9 g/dL — ABNORMAL LOW (ref 12.0–15.0)
MCH: 27.1 pg (ref 26.0–34.0)
MCHC: 31.6 g/dL (ref 30.0–36.0)
MCV: 85.6 fL (ref 80.0–100.0)
Platelets: 215 10*3/uL (ref 150–400)
RBC: 4.39 MIL/uL (ref 3.87–5.11)
RDW: 19.3 % — ABNORMAL HIGH (ref 11.5–15.5)
WBC: 7.2 10*3/uL (ref 4.0–10.5)
nRBC: 0 % (ref 0.0–0.2)

## 2021-09-05 LAB — LACTATE DEHYDROGENASE: LDH: 371 U/L — ABNORMAL HIGH (ref 98–192)

## 2021-09-05 LAB — TROPONIN I (HIGH SENSITIVITY)
Troponin I (High Sensitivity): 4 ng/L (ref <18)
Troponin I (High Sensitivity): 5 ng/L (ref <18)

## 2021-09-05 MED ORDER — NIFEDIPINE ER OSMOTIC RELEASE 30 MG PO TB24: 30.0000 mg | ORAL_TABLET | Freq: Every day | ORAL | 0 refills | Status: DC

## 2021-09-05 MED ORDER — POTASSIUM CHLORIDE CRYS ER 20 MEQ PO TBCR
20.0000 meq | EXTENDED_RELEASE_TABLET | Freq: Once | ORAL | Status: AC
Start: 2021-09-05 — End: 2021-09-05
  Administered 2021-09-05: 20 meq via ORAL
  Filled 2021-09-05: qty 1

## 2021-09-05 MED ORDER — FUROSEMIDE 20 MG PO TABS
20.0000 mg | ORAL_TABLET | Freq: Once | ORAL | Status: AC
  Administered 2021-09-05: 20 mg via ORAL
  Filled 2021-09-05: qty 1

## 2021-09-05 MED ORDER — NIFEDIPINE ER OSMOTIC RELEASE 30 MG PO TB24
30.0000 mg | ORAL_TABLET | Freq: Once | ORAL | Status: AC
  Administered 2021-09-05: 30 mg via ORAL
  Filled 2021-09-05: qty 1

## 2021-09-05 MED ORDER — POTASSIUM CHLORIDE CRYS ER 20 MEQ PO TBCR: 20.0000 meq | EXTENDED_RELEASE_TABLET | Freq: Every day | ORAL | 0 refills | Status: DC

## 2021-09-05 MED ORDER — FUROSEMIDE 20 MG PO TABS: 20.0000 mg | ORAL_TABLET | Freq: Every day | ORAL | 0 refills | Status: DC

## 2021-09-05 NOTE — ED Provider Triage Note (Signed)
Emergency Medicine Provider Triage Evaluation Note ? ?[address] [place] , a 31 y.o. female  was evaluated in triage.  Pt complains of lightheadedness, chest pain and shortness of breath earlier which is now resolved.  She is recently postpartum.  Endorses right extremity swelling intermittently over the past month.  Still endorses lightheadedness with positional change. ? ?Review of Systems  ?Positive: As above ?Negative: As above ? ?Physical Exam  ?BP (!) 168/107 (BP Location: Right Arm)   Pulse 77   Temp 98.9 ?F (37.2 ?C) (Oral)   Resp 18   LMP 11/24/2020   SpO2 100%  ?Gen:   Awake, no distress   ?Resp:  Normal effort  ?MSK:   Moves extremities without difficulty  ?Other:  Right lower extremity swelling ? ?Medical Decision Making  ?Medically screening exam initiated at 3:49 PM.  Appropriate orders placed.  Sharlotte Gabriel Rung Spang was informed that the remainder of the evaluation will be completed by another provider, this initial triage assessment does not replace that evaluation, and the importance of remaining in the ED until their evaluation is complete. ? ? ?  ?Marita Kansas, PA-C ?09/05/21 1550 ? ?

## 2021-09-05 NOTE — Progress Notes (Signed)
Right lower extremity venous duplex completed. ?Refer to "CV Proc" under chart review to view preliminary results. ? ?09/05/2021 4:28 PM ?Kelby Aline., MHA, RVT, RDCS, RDMS   ?

## 2021-09-05 NOTE — Discharge Instructions (Signed)
It appears that your discomfort is due to high blood pressure.  We are treating you with a blood pressure medicine and a fluid medicine to help with the swelling in your legs.  Try to stay on a low-salt diet for now.  Call your obstetrician for a follow-up appointment for postpartum checkup, next week.  It is important to get a sooner follow-up than you are currently planned 6-week follow-up.  Start taking the medicines that were sent to your pharmacy, tomorrow.  If you develop worsening symptoms or have other concerns you can always return here. ?

## 2021-09-05 NOTE — ED Provider Notes (Signed)
?[facility] EMERGENCY DEPARTMENT ?Provider Note ? ? ?CSN: [id number] ?Arrival date & time: 09/05/21  1509 ? ?  ? ?History ? ?Chief Complaint  ?Patient presents with  ? Dizziness  ? ? ?Shelby Obrien is a 31 y.o. female. ? ?HPI ?Patient presenting for evaluation of multiple complaints including lightheadedness, and shortness of breath.  The lightheadedness occurred today, when she was at her child school.  Earlier today she had some chest discomfort but it resolved spontaneously.  She recently delivered a child.  She has had some swelling of her right lower leg over the last month.  She delivered a child prematurely, vaginally, due to premature rupture of membranes at 32 weeks, on 08/05/2021.  She states that her shortness of breath is like her typical asthma symptoms.  She is currently breast-feeding but having a little trouble with the right breast versus the left.  Previously she has had low blood pressure, and she has never been treated for high blood pressure. She states her right leg has been swollen ever since she delivered her child 4 weeks ago. ?  ? ?Home Medications ?Prior to Admission medications   ?Medication Sig Start Date End Date Taking? Authorizing Provider  ?furosemide (LASIX) 20 MG tablet Take 1 tablet (20 mg total) by mouth daily. 09/05/21  Yes Mancel Bale, MD  ?NIFEdipine (PROCARDIA XL) 30 MG 24 hr tablet Take 1 tablet (30 mg total) by mouth daily. 09/05/21  Yes Mancel Bale, MD  ?potassium chloride SA (KLOR-CON M) 20 MEQ tablet Take 1 tablet (20 mEq total) by mouth daily. 09/05/21  Yes Mancel Bale, MD  ?acetaminophen (TYLENOL) 500 MG tablet Take 2 tablets (1,000 mg total) by mouth every 6 (six) hours as needed for fever or headache. 07/18/21   Dominic, Courtney Heys, CNM  ?albuterol (VENTOLIN HFA) 108 (90 Base) MCG/ACT inhaler Inhale 2 puffs into the lungs every 6 (six) hours as needed for wheezing or shortness of breath. 08/19/21   Nelwyn Salisbury, MD  ?cyclobenzaprine  (FLEXERIL) 10 MG tablet Take 1 tablet (10 mg total) by mouth 3 (three) times daily as needed for muscle spasms. 07/31/21   Mirna Mires, CNM  ?Prenatal Vit-Fe Fumarate-FA (PRENATAL MULTIVITAMIN) TABS tablet Take 1 tablet by mouth daily at 12 noon.    [provider]  ?   ? ?Allergies    ?Shellfish allergy, Banana, Blueberry flavor, Other, and Raspberry   ? ?Review of Systems   ?Review of Systems ? ?Physical Exam ?Updated Vital Signs ?BP (!) 157/89   Pulse 66   Temp 98.9 ?F (37.2 ?C) (Oral)   Resp 17   LMP 11/24/2020   SpO2 100%  ?Physical Exam ?Vitals and nursing note reviewed.  ?Constitutional:   ?   General: She is not in acute distress. ?   Appearance: She is well-developed. She is obese. She is not ill-appearing, toxic-appearing or diaphoretic.  ?HENT:  ?   Head: Normocephalic and atraumatic.  ?   Right Ear: External ear normal.  ?   Left Ear: External ear normal.  ?Eyes:  ?   Conjunctiva/sclera: Conjunctivae normal.  ?   Pupils: Pupils are equal, round, and reactive to light.  ?Neck:  ?   Trachea: Phonation normal.  ?Cardiovascular:  ?   Rate and Rhythm: Normal rate.  ?Pulmonary:  ?   Effort: Pulmonary effort is normal. No respiratory distress.  ?   Breath sounds: Normal breath sounds. No stridor. No wheezing or rhonchi.  ?Abdominal:  ?  Tenderness: There is no abdominal tenderness.  ?Musculoskeletal:     ?   General: Normal range of motion.  ?   Cervical back: Normal range of motion and neck supple.  ?   Right lower leg: Edema present.  ?   Left lower leg: Edema present.  ?   Comments: Right greater than left lower leg edema.  No tenderness of the right leg.  ?Skin: ?   General: Skin is warm and dry.  ?Neurological:  ?   Mental Status: She is alert and oriented to person, place, and time.  ?   Cranial Nerves: No cranial nerve deficit.  ?   Sensory: No sensory deficit.  ?   Motor: No abnormal muscle tone.  ?   Coordination: Coordination normal.  ?Psychiatric:     ?   Mood and Affect: Mood  normal.     ?   Behavior: Behavior normal.     ?   Thought Content: Thought content normal.     ?   Judgment: Judgment normal.  ? ? ?ED Results / Procedures / Treatments   ?Labs ?(all labs ordered are listed, but only abnormal results are displayed) ?Labs Reviewed  ?BASIC METABOLIC PANEL - Abnormal; Notable for the following components:  ?    Result Value  ? Creatinine, Ser 1.04 (*)   ? All other components within normal limits  ?CBC - Abnormal; Notable for the following components:  ? Hemoglobin 11.9 (*)   ? RDW 19.3 (*)   ? All other components within normal limits  ?URINALYSIS, ROUTINE W REFLEX MICROSCOPIC - Abnormal; Notable for the following components:  ? APPearance HAZY (*)   ? Hgb urine dipstick MODERATE (*)   ? Leukocytes,Ua TRACE (*)   ? Bacteria, UA RARE (*)   ? Non Squamous Epithelial 0-5 (*)   ? All other components within normal limits  ?HEPATIC FUNCTION PANEL  ?LACTATE DEHYDROGENASE  ?CBG MONITORING, ED  ?I-STAT BETA HCG BLOOD, ED ([facility], [facility], [facility] ONLY)  ?TROPONIN I (HIGH SENSITIVITY)  ?TROPONIN I (HIGH SENSITIVITY)  ? ? ?EKG ?EKG Interpretation ? ?Date/Time:  Thursday Sep 05 2021 15:20:50 EDT ?Ventricular Rate:  81 ?PR Interval:  156 ?QRS Duration: 80 ?QT Interval:  380 ?QTC Calculation: 441 ?R Axis:   23 ?Text Interpretation: Normal sinus rhythm Normal ECG When compared with ECG of 10-Dec-2020 18:10, PREVIOUS ECG IS PRESENT since last tracing no significant change Confirmed by Mancel BaleWentz, Daylyn Azbill [phone]([id number]) on 09/05/2021 9:11:25 PM ? ?Radiology ?DG Chest 2 View ? ?Result Date: 09/05/2021 ?CLINICAL DATA:  Chest pain, dyspnea. EXAM: CHEST - 2 VIEW COMPARISON:  12/10/2020 FINDINGS: The heart size and mediastinal contours are within normal limits. Both lungs are clear. The visualized skeletal structures are unremarkable. IMPRESSION: No active cardiopulmonary disease. Electronically Signed   By: Helyn NumbersAshesh  Parikh M.D.   On: 09/05/2021 17:16  ? ?VAS US LOWER EXTREMITY VENOUS (DVT) (7a-7p) ? ?Result Date: 09/05/2021 ? Lower  Venous DVT Study Patient Name:  Shelby Obrien  Date of Exam:   09/05/2021 Medical Rec #: [id number]                Accession #:    [id number] Date of Birth: [birth year]                Patient Gender: F Patient Age:   [age] years [facility] Procedure:      VAS US LOWER EXTREMITY VENOUS (DVT) Referring Phys: AMJAD ALI --------------------------------------------------------------------------------  Indications: Right  lower extremity edema, one month post partum.  Limitations: Body habitus and poor ultrasound/tissue interface. Comparison Study: No prior study Performing Technologist: Gertie Fey MHA, RDMS, RVT, RDCS  Examination Guidelines: A complete evaluation includes B-mode imaging, spectral Doppler, color Doppler, and power Doppler as needed of all accessible portions of each vessel. Bilateral testing is considered an integral part of a complete examination. Limited examinations for reoccurring indications may be performed as noted. The reflux portion of the exam is performed with the patient in reverse Trendelenburg.  +---------+---------------+---------+-----------+----------+--------------+ RIGHT    CompressibilityPhasicitySpontaneityPropertiesThrombus Aging +---------+---------------+---------+-----------+----------+--------------+ CFV      Full           Yes      Yes                                 +---------+---------------+---------+-----------+----------+--------------+ SFJ      Full                                                        +---------+---------------+---------+-----------+----------+--------------+ FV Prox  Full                                                        +---------+---------------+---------+-----------+----------+--------------+ FV Mid   Full                                                        +---------+---------------+---------+-----------+----------+--------------+ FV DistalFull                                                         +---------+---------------+---------+-----------+----------+--------------+ PFV      Full                                                        +---------+---------------+---------+-----------+----------+------------

## 2021-09-05 NOTE — ED Triage Notes (Addendum)
Pt BIB EMS from school today. Intermittent dizziness, slight CP that has since resolved.  Orhtostatic with EMS with HR.  No cardiac hx.  NSR with EMS.  CBG 140.  Pt states she was nursing her 45 month old when she began to feel "swimmy headed" and went to lie down and subsequently had an episode of chest pain.  Normal vaginal delivery full term 1 month ago.  States she has only had similar dizziness while she was pregnant.  ?

## 2021-09-06 ENCOUNTER — Ambulatory Visit (INDEPENDENT_AMBULATORY_CARE_PROVIDER_SITE_OTHER): Payer: BC Managed Care – PPO | Admitting: Licensed Practical Nurse

## 2021-09-06 VITALS — BP 142/80 | Ht 64.0 in | Wt 265.0 lb

## 2021-09-06 DIAGNOSIS — O165 Unspecified maternal hypertension, complicating the puerperium: Secondary | ICD-10-CM | POA: Diagnosis not present

## 2021-09-06 NOTE — Patient Instructions (Signed)
Take 1 extra strength Tylenol, 25 to 50 mg Benadryl and 1 can of coke or 1 cup of coffee now ? ?Check BP twice a day, once in the morning, once in the evening ?Sit with your feet flat on the floor for 10 minutes before checking your BP  ?Call if your BP is >150-90 ? ?

## 2021-09-08 DIAGNOSIS — O165 Unspecified maternal hypertension, complicating the puerperium: Secondary | ICD-10-CM | POA: Insufficient documentation

## 2021-09-08 NOTE — Progress Notes (Signed)
S) s.p SVB on 08/07/21 (Pt SROM'd at [redacted]w[redacted]d, bir[facility] [place]) ?Was seen in the ED yesterday for dizziness.  Her Blood Pressures was elevated during that visit, CMP was WNL, she was started on Procardia dn instructed to follow up with her OB provider.  ? ?Reports having a severe headache "8/10" since this morning, the light bother her eyes, she has tried to rest as she is very tired, she not taken Tylenol and has not yet taken Procardia as prescribed, she is waiting for her prescriptions to be filled.  ? ?Currently breastfeeding ?Lochia: light, barely present  ? ?O) BP (!) 142/80   Ht 5\' 4"  (1.626 m)   Wt 265 lb (120.2 kg)   LMP 11/24/2020   Breastfeeding Yes   BMI 45.49 kg/m?  ? ?Gen: tired appearing ?Eyes:sclera injected (pt reports her eye look like like that when she is tired) ?Lungs: breathing without difficulty ? ?CMP on 5/11 WNL  ? ?A) elevated blood pressure  ? ?P) ?Discussed HA could be related to tiredness, given that her labs were normal and she is 4 wks PP, less concerned for preeclampsia.  However, it is important that you take your blood pressure lower medication.  ?-Will do headache cocktail Tylenol, Benadryl, and a can of coke once home, if Headache persists, she will need to return to the hospital.  ?-take Procardia as prescribed, check BP twice a day, call if greater than 150/90 ?-return Monday or Tuesday for BP check  ? ?

## 2021-09-09 ENCOUNTER — Encounter: Payer: Self-pay | Admitting: Advanced Practice Midwife

## 2021-09-09 ENCOUNTER — Ambulatory Visit (INDEPENDENT_AMBULATORY_CARE_PROVIDER_SITE_OTHER): Payer: BC Managed Care – PPO | Admitting: Advanced Practice Midwife

## 2021-09-09 VITALS — BP 120/90 | Ht 64.0 in | Wt 265.0 lb

## 2021-09-09 DIAGNOSIS — Z0130 Encounter for examination of blood pressure without abnormal findings: Secondary | ICD-10-CM

## 2021-09-09 NOTE — Progress Notes (Signed)
? ? ? ?Obstetrics & Gynecology Office Visit  ? ?Chief Complaint:  ?Chief Complaint  ?Patient presents with  ? Blood Pressure Check  ? ? ?History of Present Illness: 31 y.o. [id number] being seen for follow up blood pressure check today.  The patient is  32 weeks postpartum. The established diagnosis for the patient is  hypertension unspecified per ED visit last week .  She is currently on nifedipine ER 32mg .  She reports feeling lightheaded before taking her medication this morning. Overall she feels much better today than she did at Friday's visit. Medication list reviewed medications which may contribute to BP elevation were not noted. ? ?Review of Systems:  ?Review of Systems  ?Constitutional:  Negative for chills and fever.  ?HENT:  Negative for congestion, ear discharge, ear pain, hearing loss, sinus pain and sore throat.   ?Eyes:  Negative for blurred vision and double vision.  ?Respiratory:  Negative for cough, shortness of breath and wheezing.   ?Cardiovascular:  Negative for chest pain, palpitations and leg swelling.  ?Gastrointestinal:  Negative for abdominal pain, blood in stool, constipation, diarrhea, heartburn, melena, nausea and vomiting.  ?Genitourinary:  Negative for dysuria, flank pain, frequency, hematuria and urgency.  ?Musculoskeletal:  Negative for back pain, joint pain and myalgias.  ?Skin:  Negative for itching and rash.  ?Neurological:  Negative for dizziness, tingling, tremors, sensory change, speech change, focal weakness, seizures, loss of consciousness, weakness and headaches.  ?     Positive for lightheaded  ?Endo/Heme/Allergies:  Negative for environmental allergies. Does not bruise/bleed easily.  ?Psychiatric/Behavioral:  Negative for depression, hallucinations, memory loss, substance abuse and suicidal ideas. The patient is not nervous/anxious and does not have insomnia.   ? ? ?Past Medical History:  ?Past Medical History:  ?Diagnosis Date  ? Anemia affecting pregnancy in third  trimester   ? Asthma   ? Migraines   ? Obesity   ? BMI>40  ? Rapid palpitations 03/01/2021  ? Tachycardia, unspecified   ? ? ?Past Surgical History:  ?Past Surgical History:  ?Procedure Laterality Date  ? WISDOM TOOTH EXTRACTION    ? ? ?Gynecologic History: No LMP recorded. ? ?Obstetric History: 13/07/2020 ? ?Family History:  ?Family History  ?Problem Relation Age of Onset  ? Cervical cancer Mother   ? Diabetes Sister   ? Diabetes Paternal Uncle   ? Diabetes Maternal Grandmother   ? Heart disease Maternal Grandmother   ? Diabetes Paternal Grandmother   ? Heart disease Paternal Grandmother   ? ? ?Social History:  ?Social History  ? ?Socioeconomic History  ? Marital status: Single  ?  Spouse name: Not on file  ? Number of children: 2  ? Years of education: 8  ? Highest education level: Bachelor's degree (e.g., BA, AB, BS)  ?Occupational History  ? Occupation: cook  ?  Employer: [employer]  ?Tobacco Use  ? Smoking status: Former  ?  Packs/day: 0.00  ?  Types: Cigarettes  ?  Quit date: 01/02/2021  ?  Years since quitting: 0.6  ? Smokeless tobacco: Never  ?Vaping Use  ? Vaping Use: Never used  ?Substance and Sexual Activity  ? Alcohol use: No  ? Drug use: No  ? Sexual activity: Not Currently  ?  Birth control/protection: None  ?Other Topics Concern  ? Not on file  ?Social History Narrative  ? Lives with boyfriend and 2 daughters in an apartment on the 2nd floor.  Works as a 03/04/2021 at [employer].  Education: college.  She is right-handed. Caffeine - one glass every few weeks. She is active with her children and work. No smoking.   ? ?Social Determinants of Health  ? ?Financial Resource Strain: Not on file  ?Food Insecurity: Not on file  ?Transportation Needs: Not on file  ?Physical Activity: Not on file  ?Stress: Not on file  ?Social Connections: Not on file  ?Intimate Partner Violence: Not on file  ? ? ?Allergies:  ?Allergies  ?Allergen Reactions  ? Shellfish Allergy Anaphylaxis  ? Banana   ? Blueberry Flavor   ? Other   ?   BLUEBERRIES, BANANAS, TREE NUTS, RASPBERRIES, GRAPES, POLLENS/SEASONAL.  ? Raspberry   ? ? ?Medications: ?Prior to Admission medications   ?Medication Sig Start Date End Date Taking? Authorizing Provider  ?acetaminophen (TYLENOL) 500 MG tablet Take 2 tablets (1,000 mg total) by mouth every 6 (six) hours as needed for fever or headache. 07/18/21  Yes Dominic, Courtney Heys, CNM  ?albuterol (VENTOLIN HFA) 108 (90 Base) MCG/ACT inhaler Inhale 2 puffs into the lungs every 6 (six) hours as needed for wheezing or shortness of breath. 08/19/21  Yes Nelwyn Salisbury, MD  ?furosemide (LASIX) 20 MG tablet Take 1 tablet (20 mg total) by mouth daily. 09/05/21  Yes Mancel Bale, MD  ?NIFEdipine (PROCARDIA XL) 30 MG 24 hr tablet Take 1 tablet (30 mg total) by mouth daily. 09/05/21  Yes Mancel Bale, MD  ?potassium chloride SA (KLOR-CON M) 20 MEQ tablet Take 1 tablet (20 mEq total) by mouth daily. 09/05/21  Yes Mancel Bale, MD  ?Prenatal Vit-Fe Fumarate-FA (PRENATAL MULTIVITAMIN) TABS tablet Take 1 tablet by mouth daily at 12 noon.   Yes [provider]  ? ? ?Physical Exam ?Blood pressure 120/90, height 5\' 4"  (1.626 m), weight 265 lb (120.2 kg) ? ?No LMP recorded. ? ?General: NAD ?HEENT: normocephalic, anicteric ?Pulmonary: No increased work of breathing ?Cardiovascular: RRR, distal pulses 2+ ?Extremities:  no edema, no erythema, no tenderness ?Neurologic: Grossly intact ?Psychiatric: mood appropriate, affect full ? ?Assessment: 31 y.o. 31 presenting for blood pressure evaluation today ? ?Plan: ?Problem List Items Addressed This Visit   ?None ?Visit Diagnoses   ? ? BP check    -  Primary  ? ?  ? ? ?1) Blood pressure - blood pressure at today's visit is  diastolic mildly elevated .  As a result  patient will continue with current dosage of anti hypertensive medication . ?- additional blood work was not obtained ?2) Return for 6 week pp visit as scheduled ? ? ?[id number], CNM ?[facility] ?[facility] ?09/09/2021, 2:10 PM ? ?  ?

## 2021-09-16 ENCOUNTER — Ambulatory Visit: Payer: BC Managed Care – PPO | Admitting: Obstetrics

## 2021-09-17 ENCOUNTER — Encounter: Payer: Self-pay | Admitting: Obstetrics

## 2021-09-17 ENCOUNTER — Telehealth: Payer: Self-pay | Admitting: Obstetrics and Gynecology

## 2021-09-17 ENCOUNTER — Ambulatory Visit (INDEPENDENT_AMBULATORY_CARE_PROVIDER_SITE_OTHER): Payer: BC Managed Care – PPO | Admitting: Obstetrics

## 2021-09-17 VITALS — BP 150/98 | Ht 64.0 in | Wt 269.0 lb

## 2021-09-17 DIAGNOSIS — O165 Unspecified maternal hypertension, complicating the puerperium: Secondary | ICD-10-CM

## 2021-09-17 MED ORDER — NIFEDIPINE ER OSMOTIC RELEASE 30 MG PO TB24: 30.0000 mg | ORAL_TABLET | Freq: Every day | ORAL | 0 refills | Status: DC

## 2021-09-17 NOTE — Progress Notes (Signed)
Postpartum Visit  Chief Complaint:  Chief Complaint  Patient presents with   Postpartum Care    History of Present Illness: Patient is a 31 y.o. [id number] presents for postpartum visit.Shelby Obrien delivered at [facility] after SROM when she was 36+ weeks gestation. Her labor was augmented there and she had a rapid second stage. Last week she began to feel dizzy at her child's school; was found to be hypertensive and had a work up at the ED. She was placed on Procardia 30 XL and Lasix, and told to f/u at [facility]. She forgot to take her med today and is hypertensive on her arrival. She denies any headache or dizziness.  She describes her labor and birth as uneventful and delivered intact.  Date of delivery: 08/05/2021 Type of delivery: Vaginal delivery - Vacuum or forceps assisted  no Episiotomy No.  Laceration: no  Pregnancy or labor problems:  PPROM at 36 weeks Any problems since the delivery:  yes; recently seen at the ED for HTN; placed on Procardia 30 XL and Lasix.  Newborn Details:  SINGLETON :  1. Baby's name: female. Birth weight: 2866 Maternal Details:  Breast Feeding:  no Post partum depression/anxiety noted:  no Edinburgh Post-Partum Depression Score:  1  Date of last PAP: 2022  normal   Past Medical History:  Diagnosis Date   Anemia affecting pregnancy in third trimester    Asthma    Migraines    Obesity    BMI>40   Rapid palpitations 03/01/2021   Tachycardia, unspecified     Past Surgical History:  Procedure Laterality Date   WISDOM TOOTH EXTRACTION      Prior to Admission medications   Medication Sig Start Date End Date Taking? Authorizing Provider  acetaminophen (TYLENOL) 500 MG tablet Take 2 tablets (1,000 mg total) by mouth every 6 (six) hours as needed for fever or headache. 07/18/21  Yes Dominic, Courtney Heys, CNM  albuterol (VENTOLIN HFA) 108 (90 Base) MCG/ACT inhaler Inhale 2 puffs into the lungs every 6 (six) hours as needed for wheezing or shortness of breath.  08/19/21  Yes Nelwyn Salisbury, MD  furosemide (LASIX) 20 MG tablet Take 1 tablet (20 mg total) by mouth daily. 09/05/21  Yes Mancel Bale, MD  potassium chloride SA (KLOR-CON M) 20 MEQ tablet Take 1 tablet (20 mEq total) by mouth daily. 09/05/21  Yes Mancel Bale, MD  Prenatal Vit-Fe Fumarate-FA (PRENATAL MULTIVITAMIN) TABS tablet Take 1 tablet by mouth daily at 12 noon.   Yes [provider]  NIFEdipine (PROCARDIA XL) 30 MG 24 hr tablet Take 1 tablet (30 mg total) by mouth daily. 09/17/21   Mirna Mires, CNM    Allergies  Allergen Reactions   Shellfish Allergy Anaphylaxis   Banana    Blueberry Flavor    Other     BLUEBERRIES, BANANAS, TREE NUTS, RASPBERRIES, GRAPES, POLLENS/SEASONAL.   Raspberry      Social History   Socioeconomic History   Marital status: Single    Spouse name: Not on file   Number of children: 2   Years of education: 16   Highest education level: Bachelor's degree (e.g., BA, AB, BS)  Occupational History   Occupation: Multimedia programmer: [employer]  Tobacco Use   Smoking status: Former    Packs/day: 0.00    Types: Cigarettes    Quit date: 01/02/2021    Years since quitting: 0.7   Smokeless tobacco: Never  Vaping Use   Vaping Use: Never used  Substance and Sexual Activity   Alcohol use: No   Drug use: No   Sexual activity: Not Currently    Birth control/protection: None  Other Topics Concern   Not on file  Social History Narrative   Lives with boyfriend and 2 daughters in an apartment on the 2nd floor.  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  Education: college. She is right-handed. Caffeine - one glass every few weeks. She is active with her children and work. No smoking.    Social Determinants of Health   Financial Resource Strain: Not on file  Food Insecurity: Not on file  Transportation Needs: Not on file  Physical Activity: Not on file  Stress: Not on file  Social Connections: Not on file  Intimate Partner Violence: Not on file     Family History  Problem Relation Age of Onset   Cervical cancer Mother    Diabetes Sister    Diabetes Paternal Uncle    Diabetes Maternal Grandmother    Heart disease Maternal Grandmother    Diabetes Paternal Grandmother    Heart disease Paternal Grandmother     Review of Systems  Constitutional: Negative.   HENT: Negative.    Eyes: Negative.   Respiratory: Negative.    Cardiovascular: Negative.   Gastrointestinal: Negative.   Genitourinary: Negative.   Musculoskeletal: Negative.   Skin: Negative.   Neurological: Negative.   Endo/Heme/Allergies: Negative.   Psychiatric/Behavioral: Negative.      Physical Exam BP (!) 150/98   Ht 5\' 4"  (1.626 m)   Wt 269 lb (122 kg)   BMI 46.17 kg/m   Physical Exam Constitutional:      Appearance: Normal appearance. She is obese.  Genitourinary:     Vulva and rectum normal.     Genitourinary Comments: + menses today. Uterus is somewhat difficult to assess due to high BMI. Anteverted.     No vaginal discharge.  HENT:     Head: Normocephalic and atraumatic.  Cardiovascular:     Rate and Rhythm: Normal rate and regular rhythm.     Pulses: Normal pulses.     Heart sounds: Normal heart sounds.  Pulmonary:     Effort: Pulmonary effort is normal.     Breath sounds: Normal breath sounds.  Abdominal:     Palpations: Abdomen is soft.  Musculoskeletal:        General: Normal range of motion.     Cervical back: Normal range of motion and neck supple.  Neurological:     General: No focal deficit present.     Mental Status: She is alert and oriented to person, place, and time.  Skin:    General: Skin is warm and dry.  Psychiatric:        Mood and Affect: Mood normal.        Behavior: Behavior normal.  Vitals reviewed.     Female Chaperone present during breast and/or pelvic exam.  Assessment: 31 y.o. [id number] presenting for 6 week postpartum visit Hypertension  of recent diagnosis at 5 weeks PP  Desires IUD  insertion  Plan: Problem List Items Addressed This Visit       Cardiovascular and Mediastinum   Postpartum hypertension - Primary   Relevant Medications   NIFEdipine (PROCARDIA XL) 30 MG 24 hr tablet   Other Visit Diagnoses     Postpartum care following vaginal delivery       Relevant Medications   NIFEdipine (PROCARDIA XL) 30 MG 24 hr tablet  She took her Procardai tab in the office as she had not taken it today. Importance of remaining on this medication stressed.  1) Contraception Education given regarding options for contraception, including IUD placement.She is provided education on this method as well as printed material and will be scheduled for IUD insertion in the next 1-2 weeks.  2)  Pap - ASCCP guidelines and rational discussed.  Patient opts for annual screening interval  3) Patient underwent screening for postpartum depression with no concerns noted.  4) Follow up 1  week for BP check and medication review. Her Procardia is renewed for the next 30 days.  Careful guidance provided re sxs of HTN and import of being seen if her HTN continues despite the medication.  Thomasene Mohair, MD 09/17/2021 3:43 PM

## 2021-09-17 NOTE — Telephone Encounter (Signed)
Pt is scheduled with Dr. Jolayne Panther on Wednesday, May 31st at 10:15 for Mirena insertion.

## 2021-09-18 NOTE — Telephone Encounter (Signed)
Noted. Mirena reserved for this patient. 

## 2021-09-25 ENCOUNTER — Ambulatory Visit (INDEPENDENT_AMBULATORY_CARE_PROVIDER_SITE_OTHER): Payer: BC Managed Care – PPO | Admitting: Obstetrics and Gynecology

## 2021-09-25 ENCOUNTER — Encounter: Payer: Self-pay | Admitting: Obstetrics and Gynecology

## 2021-09-25 VITALS — BP 136/90 | Wt 276.0 lb

## 2021-09-25 DIAGNOSIS — Z3202 Encounter for pregnancy test, result negative: Secondary | ICD-10-CM

## 2021-09-25 DIAGNOSIS — Z30430 Encounter for insertion of intrauterine contraceptive device: Secondary | ICD-10-CM

## 2021-09-25 LAB — POCT URINE PREGNANCY: Preg Test, Ur: NEGATIVE

## 2021-09-25 MED ORDER — LEVONORGESTREL 20 MCG/DAY IU IUD
1.0000 | INTRAUTERINE_SYSTEM | Freq: Once | INTRAUTERINE | Status: AC
  Administered 2021-09-25: 1 via INTRAUTERINE

## 2021-09-25 NOTE — Addendum Note (Signed)
Addended by: Kathlene Cote on: 09/25/2021 04:03 PM   Modules accepted: Orders

## 2021-09-25 NOTE — Progress Notes (Signed)
31 yo P3 here for Mirena IUD insertion. Patient with diagnosis of postpartum hypertension and was started on procardia and lasix on 09/05/21. Patient currently taking procardia without complaints  Blood pressure 136/90, weight 276 lb (125.2 kg), last menstrual period 09/18/2021, not currently breastfeeding.  IUD Procedure Note Patient identified, informed consent performed, signed copy in chart, time out was performed.  Urine pregnancy test negative.  Speculum placed in the vagina.  Cervix visualized.  Cleaned with Betadine x 2.  Grasped anteriorly with a single tooth tenaculum.  Uterus sounded to 8 cm.  Mirena IUD placed per manufacturer's recommendations.  Strings trimmed to 3 cm. Tenaculum was removed, good hemostasis noted.  Patient tolerated procedure well.   Patient given post procedure instructions and Mirena care card with expiration date.  Patient is asked to check IUD strings periodically and follow up in 4-6 weeks for IUD check and BP check

## 2021-10-04 ENCOUNTER — Telehealth: Payer: Self-pay

## 2021-10-04 ENCOUNTER — Ambulatory Visit (INDEPENDENT_AMBULATORY_CARE_PROVIDER_SITE_OTHER): Payer: BC Managed Care – PPO

## 2021-10-04 ENCOUNTER — Ambulatory Visit (HOSPITAL_COMMUNITY)
Admission: EM | Admit: 2021-10-04 | Discharge: 2021-10-04 | Disposition: A | Discharge status: Home / Self Care | Payer: BC Managed Care – PPO

## 2021-10-04 ENCOUNTER — Encounter (HOSPITAL_COMMUNITY): Payer: Self-pay

## 2021-10-04 VITALS — BP 94/60

## 2021-10-04 DIAGNOSIS — R200 Anesthesia of skin: Secondary | ICD-10-CM

## 2021-10-04 DIAGNOSIS — I10 Essential (primary) hypertension: Secondary | ICD-10-CM | POA: Diagnosis not present

## 2021-10-04 NOTE — Telephone Encounter (Signed)
Pt coming in for BP check on nurse schedule.

## 2021-10-04 NOTE — Telephone Encounter (Signed)
Pt called triage reporting her BP is still high, she is supposed to come off the BP medication in 30 days but BP is too high.the 30 days ends tomorrow. Left message to schedule appointment.

## 2021-10-04 NOTE — Telephone Encounter (Signed)
Can you see her as a overbook today or is she ok to wait until next week?Or does she need to go to an urgent care or pcp if she has one?

## 2021-10-04 NOTE — ED Provider Notes (Signed)
[facility]    CSN: [id number] Arrival date & time: 10/04/21  0930      History   Chief Complaint Chief Complaint  Patient presents with   Hypertension    HPI Shelby Obrien is a 31 y.o. female.   Patient presents to urgent care with concern of high blood pressure.  She recently gave birth on August 05, 2021 vaginally and denies history of gestational hypertension/gestational diabetes.  She was seen in the ED last month and prescribed nifedipine, Lasix, and potassium all of which she has been taking consistently.  At her OB/GYN follow-up 1 week after her ER visit, she states that she was told to take the medications until they run out then trial being off the medications and see what her blood pressure readings are.  Patient is comfortable with this and is very concerned about her blood pressure at this time.  She states that she took her medications this morning.  Prior to taking her medications, her blood pressure was 138/91 and 1 hour after taking her medications, her blood pressure rose to 161/80.  She states that she has never had her home blood pressure machine calibrated at a medical facility to ensure accuracy.  She denies one-sided weakness, abdominal pain, dizziness, headache, blurry vision, decreased visual acuity, ear pain, throat pain, and urinary symptoms at this time.  She is reporting a small amount of numbness and tingling to her bilateral cheeks and states that it "feels like when my hand falls asleep and I am trying to wake it up".  She also states that she experiences "slight panic symptoms" and anxiety before she goes to sleep at night.  She reports positive history of anxiety while pregnant, but denies history of postpartum depression and anxiety.     Hypertension    Past Medical History:  Diagnosis Date   Anemia affecting pregnancy in third trimester    Asthma    Migraines    Obesity    BMI>40   Rapid palpitations 03/01/2021   Tachycardia,  unspecified     Patient Active Problem List   Diagnosis Date Noted   Postpartum hypertension 09/08/2021   Vaginal delivery 08/05/2021   Symptomatic anemia 07/05/2021   Eczema 06/01/2019   Anxiety and depression 09/04/2017   Maternal obesity affecting pregnancy, antepartum 02/26/2017   Maternal morbid obesity, antepartum (HCC) 02/26/2017   Migraines 10/07/2016   Asthma 10/07/2016    Past Surgical History:  Procedure Laterality Date   WISDOM TOOTH EXTRACTION      OB History     Gravida  4   Para  3   Term  2   Preterm  1   AB  1   Living  3      SAB  1   IAB      Ectopic      Multiple  0   Live Births  3            Home Medications    Prior to Admission medications   Medication Sig Start Date End Date Taking? Authorizing Provider  acetaminophen (TYLENOL) 500 MG tablet Take 2 tablets (1,000 mg total) by mouth every 6 (six) hours as needed for fever or headache. 07/18/21  Yes Dominic, Courtney HeysLydia Marie, CNM  albuterol (VENTOLIN HFA) 108 (90 Base) MCG/ACT inhaler Inhale 2 puffs into the lungs every 6 (six) hours as needed for wheezing or shortness of breath. 08/19/21  Yes Nelwyn SalisburyFry, Stephen A, MD  furosemide (LASIX) 20 MG  tablet Take 1 tablet (20 mg total) by mouth daily. 09/05/21  Yes Mancel Bale, MD  NIFEdipine (PROCARDIA XL) 30 MG 24 hr tablet Take 1 tablet (30 mg total) by mouth daily. 09/17/21  Yes Mirna Mires, CNM  potassium chloride SA (KLOR-CON M) 20 MEQ tablet Take 1 tablet (20 mEq total) by mouth daily. 09/05/21  Yes Mancel Bale, MD  Prenatal Vit-Fe Fumarate-FA (PRENATAL MULTIVITAMIN) TABS tablet Take 1 tablet by mouth daily at 12 noon.   Yes [provider]    Family History Family History  Problem Relation Age of Onset   Cervical cancer Mother    Diabetes Sister    Diabetes Paternal Uncle    Diabetes Maternal Grandmother    Heart disease Maternal Grandmother    Diabetes Paternal Grandmother    Heart disease Paternal Grandmother      Social History Social History   Tobacco Use   Smoking status: Former    Packs/day: 0.00    Types: Cigarettes    Quit date: 01/02/2021    Years since quitting: 0.7   Smokeless tobacco: Never  Vaping Use   Vaping Use: Never used  Substance Use Topics   Alcohol use: No   Drug use: No     Allergies   Shellfish allergy, Banana, Blueberry flavor, Other, and Raspberry   Review of Systems Review of Systems Per HPI  Physical Exam Triage Vital Signs ED Triage Vitals  Enc Vitals Group     BP 10/04/21 1002 139/84     Pulse Rate 10/04/21 1002 70     Resp 10/04/21 1002 16     Temp 10/04/21 1002 98.2 F (36.8 C)     Temp Source 10/04/21 1002 Oral     SpO2 10/04/21 1002 97 %     Weight 10/04/21 1006 272 lb (123.4 kg)     Height 10/04/21 1006  (1.626 m)     Head Circumference --      Peak Flow --      Pain Score 10/04/21 1006 0     Pain Loc --      Pain Edu? --      Excl. in GC? --    No data found.  Updated Vital Signs BP 139/84 (BP Location: Left Arm)   Pulse 70   Temp 98.2 F (36.8 C) (Oral)   Resp 16   Ht  (1.626 m)   Wt 272 lb (123.4 kg)   LMP 09/29/2021 (Exact Date)   SpO2 97%   Breastfeeding No   BMI 46.69 kg/m   Visual Acuity Right Eye Distance:   Left Eye Distance:   Bilateral Distance:    Right Eye Near:   Left Eye Near:    Bilateral Near:     Physical Exam Vitals and nursing note reviewed.  Constitutional:      General: She is not in acute distress.    Appearance: Normal appearance. She is well-developed. She is not ill-appearing.  HENT:     Head: Normocephalic and atraumatic.     Right Ear: Tympanic membrane, ear canal and external ear normal.     Left Ear: Tympanic membrane, ear canal and external ear normal.     Nose: Nose normal.     Mouth/Throat:     Mouth: Mucous membranes are moist.  Eyes:     General: Lids are normal. Vision grossly intact. Gaze aligned appropriately. No visual field deficit.    Extraocular  Movements: Extraocular movements intact.  Conjunctiva/sclera: Conjunctivae normal.  Cardiovascular:     Rate and Rhythm: Normal rate and regular rhythm.     Heart sounds: Normal heart sounds. No murmur heard.    No friction rub. No gallop.  Pulmonary:     Effort: Pulmonary effort is normal. No respiratory distress.     Breath sounds: Normal breath sounds. No wheezing, rhonchi or rales.  Chest:     Chest wall: No tenderness.  Abdominal:     General: Bowel sounds are normal.     Palpations: Abdomen is soft.     Tenderness: There is no abdominal tenderness. There is no right CVA tenderness or left CVA tenderness.  Musculoskeletal:        General: No swelling.     Cervical back: Neck supple.     Right lower leg: No edema.     Left lower leg: No edema.  Skin:    General: Skin is warm and dry.     Capillary Refill: Capillary refill takes less than 2 seconds.     Findings: No rash.  Neurological:     General: No focal deficit present.     Mental Status: She is alert and oriented to person, place, and time. Mental status is at baseline.     Cranial Nerves: Cranial nerves 2-12 are intact. No dysarthria or facial asymmetry.     Sensory: Sensory deficit present.     Motor: Motor function is intact. No weakness, tremor or pronator drift.     Coordination: Coordination is intact. Romberg sign negative. Coordination normal. Finger-Nose-Finger Test normal.     Gait: Gait is intact. Gait normal.     Comments: Patient states that it feels different when touched lightly to her right cheek versus her left cheek.  She is able to discern sharp versus dull.  There is no sensation abnormality to her bilateral upper and lower extremities.   Psychiatric:        Mood and Affect: Mood normal.        Behavior: Behavior normal.        Thought Content: Thought content normal.        Judgment: Judgment normal.      UC Treatments / Results  Labs (all labs ordered are listed, but only abnormal results  are displayed) Labs Reviewed - No data to display   EKG   Radiology No results found.  Procedures Procedures (including critical care time)  Medications Ordered in UC Medications - No data to display  Initial Impression / Assessment and Plan / UC Course  I have reviewed the triage vital signs and the nursing notes.  Pertinent labs & imaging results that were available during my care of the patient were reviewed by me and considered in my medical decision making (see chart for details).   She was seen in the emergency department on Sep 05, 2021 for lightheadedness where her blood pressure was elevated in the 150s over 80s and diagnosed with postpartum hypertension unrelated to pregnancy and without preeclampsia.  Blood pressure is stable in the clinic today.  Neurologic exam is stable in the clinic today without indication of acute focal abnormality requiring emergency evaluation or advanced imaging.  Sensation abnormality to patient's face is not currently concerning for brain abnormality as she has normal sensation to her bilateral upper and lower extremities.  Encourage patient to schedule an appointment with her primary care provider for further evaluation of facial numbness and blood pressure management.  No concern for Bell's palsy  related to pregnancy as patient does not have any eyelid drooping or facial droop.  Patient is uncomfortable with stopping her blood pressure medications and is concerned, but states that she agrees with plan and will monitor her blood pressure twice daily at home as recommended.  Recommend patient bring her blood pressure cuff to her PCP appointment for calibration to ensure accuracy of home blood pressures.   Counseled patient regarding appropriate use of medications and potential side effects for all medications recommended or prescribed today. Discussed red flag signs and symptoms of worsening condition,when to call the PCP office, return to urgent care,  and when to seek higher level of care. Patient verbalizes understanding and agreement with plan. All questions answered. Patient discharged in stable condition.   Final Clinical Impressions(s) / UC Diagnoses   Final diagnoses:  Numbness     Discharge Instructions      You were seen in urgent care today for your blood pressure.  Your blood pressure looks very good in the urgent care today.  Continue to take your nifedipine, potassium, and Lasix until you run out of your medication.  At that point, stop taking this medication and continue to monitor your blood pressure twice daily and keep a log.  Schedule an appointment with your primary care provider for soon as possible to reestablish care after your pregnancy for further blood pressure management.  If you experience a sudden severe headache, blurry vision, one-sided weakness, or decreased visual acuity, please go to the emergency department for evaluation.  If you develop any new or worsening symptoms, you may return to urgent care as needed.   If you develop any new or worsening symptoms or do not improve in the next 2 to 3 days, please return.  If your symptoms are severe, please go to the emergency room.  Follow-up with your primary care provider for further evaluation and management of your symptoms as well as ongoing wellness visits.  I hope you feel better!     ED Prescriptions   None    PDMP not reviewed this encounter.   Carlisle Beers, [place] 10/04/21 1149

## 2021-10-04 NOTE — Telephone Encounter (Signed)
Pt reports her bp when she woke up was 138/91 she took her medication went back to sleep and BP is 161/89. Pt runs out of BP meds today. Was only supposed to be on them for 30 days. Should we refill? Or Change dose? Please advise

## 2021-10-04 NOTE — Discharge Instructions (Addendum)
You were seen in urgent care today for your blood pressure.  Your blood pressure looks very good in the urgent care today.  Continue to take your nifedipine, potassium, and Lasix until you run out of your medication.  At that point, stop taking this medication and continue to monitor your blood pressure twice daily and keep a log.  Schedule an appointment with your primary care provider for soon as possible to reestablish care after your pregnancy for further blood pressure management.  If you experience a sudden severe headache, blurry vision, one-sided weakness, or decreased visual acuity, please go to the emergency department for evaluation.  If you develop any new or worsening symptoms, you may return to urgent care as needed.   If you develop any new or worsening symptoms or do not improve in the next 2 to 3 days, please return.  If your symptoms are severe, please go to the emergency room.  Follow-up with your primary care provider for further evaluation and management of your symptoms as well as ongoing wellness visits.  I hope you feel better!

## 2021-10-04 NOTE — ED Triage Notes (Signed)
Patient states she noticed blood pressure was elevated this morning even after taking her medication. States she is having numbness in her face. Denies head pain.  Bp this am was 138/91 and after meds an hour later was 161/80.

## 2021-10-05 LAB — COMPREHENSIVE METABOLIC PANEL
ALT: 14 IU/L (ref 0–32)
AST: 18 IU/L (ref 0–40)
Albumin/Globulin Ratio: 1.3 (ref 1.2–2.2)
Albumin: 4 g/dL (ref 3.8–4.8)
Alkaline Phosphatase: 111 IU/L (ref 44–121)
BUN/Creatinine Ratio: 14 (ref 9–23)
BUN: 13 mg/dL (ref 6–20)
Bilirubin Total: 0.7 mg/dL (ref 0.0–1.2)
CO2: 23 mmol/L (ref 20–29)
Calcium: 9.6 mg/dL (ref 8.7–10.2)
Chloride: 104 mmol/L (ref 96–106)
Creatinine, Ser: 0.91 mg/dL (ref 0.57–1.00)
Globulin, Total: 3 g/dL (ref 1.5–4.5)
Glucose: 101 mg/dL — ABNORMAL HIGH (ref 70–99)
Potassium: 4 mmol/L (ref 3.5–5.2)
Sodium: 145 mmol/L — ABNORMAL HIGH (ref 134–144)
Total Protein: 7 g/dL (ref 6.0–8.5)
eGFR: 86 mL/min/{1.73_m2} (ref >59)

## 2021-10-07 ENCOUNTER — Emergency Department: Payer: BC Managed Care – PPO

## 2021-10-07 ENCOUNTER — Encounter: Payer: Self-pay | Admitting: Emergency Medicine

## 2021-10-07 ENCOUNTER — Telehealth: Payer: Self-pay

## 2021-10-07 ENCOUNTER — Emergency Department
Admission: EM | Admit: 2021-10-07 | Discharge: 2021-10-07 | Disposition: A | Discharge status: Home / Self Care | Payer: BC Managed Care – PPO | Attending: Emergency Medicine | Admitting: Emergency Medicine

## 2021-10-07 ENCOUNTER — Other Ambulatory Visit: Payer: Self-pay

## 2021-10-07 DIAGNOSIS — R519 Headache, unspecified: Secondary | ICD-10-CM | POA: Insufficient documentation

## 2021-10-07 DIAGNOSIS — M7989 Other specified soft tissue disorders: Secondary | ICD-10-CM | POA: Diagnosis not present

## 2021-10-07 DIAGNOSIS — R55 Syncope and collapse: Secondary | ICD-10-CM | POA: Diagnosis not present

## 2021-10-07 DIAGNOSIS — R609 Edema, unspecified: Secondary | ICD-10-CM | POA: Diagnosis not present

## 2021-10-07 DIAGNOSIS — D649 Anemia, unspecified: Secondary | ICD-10-CM | POA: Insufficient documentation

## 2021-10-07 DIAGNOSIS — R0781 Pleurodynia: Secondary | ICD-10-CM | POA: Diagnosis not present

## 2021-10-07 DIAGNOSIS — R202 Paresthesia of skin: Secondary | ICD-10-CM | POA: Diagnosis not present

## 2021-10-07 DIAGNOSIS — R42 Dizziness and giddiness: Secondary | ICD-10-CM | POA: Diagnosis not present

## 2021-10-07 DIAGNOSIS — R791 Abnormal coagulation profile: Secondary | ICD-10-CM | POA: Insufficient documentation

## 2021-10-07 DIAGNOSIS — R531 Weakness: Secondary | ICD-10-CM | POA: Diagnosis not present

## 2021-10-07 DIAGNOSIS — R079 Chest pain, unspecified: Secondary | ICD-10-CM | POA: Diagnosis not present

## 2021-10-07 DIAGNOSIS — J45909 Unspecified asthma, uncomplicated: Secondary | ICD-10-CM | POA: Diagnosis not present

## 2021-10-07 LAB — CBC
HCT: 35.5 % — ABNORMAL LOW (ref 36.0–46.0)
Hemoglobin: 11.7 g/dL — ABNORMAL LOW (ref 12.0–15.0)
MCH: 27.7 pg (ref 26.0–34.0)
MCHC: 33 g/dL (ref 30.0–36.0)
MCV: 83.9 fL (ref 80.0–100.0)
Platelets: 261 10*3/uL (ref 150–400)
RBC: 4.23 MIL/uL (ref 3.87–5.11)
RDW: 17 % — ABNORMAL HIGH (ref 11.5–15.5)
WBC: 7.8 10*3/uL (ref 4.0–10.5)
nRBC: 0 % (ref 0.0–0.2)

## 2021-10-07 LAB — BASIC METABOLIC PANEL
Anion gap: 7 (ref 5–15)
BUN: 19 mg/dL (ref 6–20)
CO2: 23 mmol/L (ref 22–32)
Calcium: 8.9 mg/dL (ref 8.9–10.3)
Chloride: 105 mmol/L (ref 98–111)
Creatinine, Ser: 0.94 mg/dL (ref 0.44–1.00)
GFR, Estimated: >60 mL/min (ref >60)
Glucose, Bld: 105 mg/dL — ABNORMAL HIGH (ref 70–99)
Potassium: 3.3 mmol/L — ABNORMAL LOW (ref 3.5–5.1)
Sodium: 135 mmol/L (ref 135–145)

## 2021-10-07 LAB — D-DIMER, QUANTITATIVE: D-Dimer, Quant: 0.56 ug/mL-FEU — ABNORMAL HIGH (ref 0.00–0.50)

## 2021-10-07 LAB — HCG, QUANTITATIVE, PREGNANCY: hCG, Beta Chain, Quant, S: <1 m[IU]/mL (ref <5)

## 2021-10-07 MED ORDER — METOCLOPRAMIDE HCL 5 MG/ML IJ SOLN
10.0000 mg | Freq: Once | INTRAMUSCULAR | Status: AC
  Administered 2021-10-07: 10 mg via INTRAVENOUS
  Filled 2021-10-07: qty 2

## 2021-10-07 MED ORDER — POTASSIUM CHLORIDE CRYS ER 20 MEQ PO TBCR
40.0000 meq | EXTENDED_RELEASE_TABLET | Freq: Once | ORAL | Status: AC
  Administered 2021-10-07: 40 meq via ORAL
  Filled 2021-10-07: qty 2

## 2021-10-07 MED ORDER — IBUPROFEN 800 MG PO TABS
800.0000 mg | ORAL_TABLET | ORAL | Status: AC
  Administered 2021-10-07: 800 mg via ORAL
  Filled 2021-10-07: qty 1

## 2021-10-07 NOTE — ED Triage Notes (Signed)
Pt via EMS from work. Pt c/o sudden onset of dizziness and weakness. States that her bilateral hands and feet intermittently become numb. States that she was just standing there when it happened. Denies LOC. Denies falling. Pt c/o headache also  Pt is A&OX4 and NAD

## 2021-10-07 NOTE — ED Provider Notes (Signed)
[facility] Provider Note    Event Date/Time   First MD Initiated Contact with Patient 10/07/21 1730     (approximate)   History   Near Syncope   HPI  Shelby Obrien is a 31 y.o. female with a history of vaginal delivery on April 10 of this year, reviewed primary care note from June 9 also has history of asthma migraines palpitations and tachycardia   Patient had a child about 2 months ago.  She has been doing well.  She is returned to work.  He was at work today, she works as a Freight forwarder at [employer], while standing she started to feel lightheaded a bit of a spinning feeling and felt as though she was going to pass out.  She held onto another coworker and did not actually pass out.  Symptoms lasted several minutes and EMS was called.  Also during this episode she had a slight sharp discomfort along her right rib cage which is gone away and felt like this sharp feeling but is now resolved.  She also relates that she felt like her hands and feet both went numb for a while as if they are falling asleep, but sensation has returned to normal except her right leg still has a slight sense of numbness over it though she can discern things it feels slightly dull to touch, and also reports a mild left-sided headache.  No trouble speaking.  No nausea or vomiting.  She feels much better now but just still feels a sense of tingling sensation along her right leg.  No chest pressure or pain except for a brief feeling of a sharpness in the right chest which is now resolved  She reports chronic swelling in her right leg since giving childbirth.  She reports she had an ultrasound maybe a month or so ago to make shows no blood clot and was told it was normal  She does take medicine for hypertension  She has been eating drinking and feeling normal in her normal state of health going to work today.  She is not breast-feeding.  No abdominal pain.  No shortness of  breath.  Physical Exam   Triage Vital Signs: ED Triage Vitals  Enc Vitals Group     BP 10/07/21 1519 (!) 133/92     Pulse Rate 10/07/21 1519 79     Resp 10/07/21 1519 20     Temp 10/07/21 1519 98.8 F (37.1 C)     Temp Source 10/07/21 1519 Oral     SpO2 10/07/21 1519 100 %     Weight 10/07/21 1519 274 lb (124.3 kg)     Height 10/07/21 1519 5\' 4"  (1.626 m)     Head Circumference --      Peak Flow --      Pain Score 10/07/21 1529 8     Pain Loc --      Pain Edu? --      Excl. in [place]? --     Most recent vital signs: Vitals:   10/07/21 1724 10/07/21 2051  BP: 130/88 131/87  Pulse: 76 63  Resp: 20 16  Temp:    SpO2: 100% 100%     General: Awake, no distress.  Very pleasant.  Well-appearing. CV:  Good peripheral perfusion.  Normal heart tones. Resp:  Normal effort.  Clear bilaterally.  Normal work of breathing.  No abnormal excursion. Abd:  No distention.  Soft nontender Other:  Mild slight unilateral circumferential  difference of the right lower extremity compared to the left but no calf tenderness venous cords or congestion.  No obvious findings suggest DVT.  She reports normal sensation across the face and arms bilaterally.  She reports a slight feeling of a mild paresthesia or a slight difference in sensation of her right lower extremity compared to the left.  She demonstrates 5 out of 5 strength in all extremities.  Normal facial expressions.  No cranial nerve deficits.  Normal extraocular movements.   ED Results / Procedures / Treatments   Labs (all labs ordered are listed, but only abnormal results are displayed) Labs Reviewed  BASIC METABOLIC PANEL - Abnormal; Notable for the following components:      Result Value   Potassium 3.3 (*)    Glucose, Bld 105 (*)    All other components within normal limits  CBC - Abnormal; Notable for the following components:   Hemoglobin 11.7 (*)    HCT 35.5 (*)    RDW 17.0 (*)    All other components within normal limits   D-DIMER, QUANTITATIVE - Abnormal; Notable for the following components:   D-Dimer, Quant 0.56 (*)    All other components within normal limits  HCG, QUANTITATIVE, PREGNANCY  URINALYSIS, ROUTINE W REFLEX MICROSCOPIC  CBG MONITORING, ED  POC URINE PREG, ED     EKG  He has interpreted by me at 1525 Heart rate 75 QRS 80 QTc 420 Normal sinus rhythm, possible left ventricular hypertrophy.  Notable Q-wave in lead III.  No evidence of acute ischemic abnormality as denoted.  Compared to EKG from May 11 of this year morphology is same   RADIOLOGY  Chest xray personally interpreted by me as normal   US Venous Img Lower Bilateral  Result Date: 10/07/2021 CLINICAL DATA:  Right leg swelling.  Chest pain.  Postpartum. EXAM: BILATERAL LOWER EXTREMITY VENOUS DOPPLER ULTRASOUND TECHNIQUE: Gray-scale sonography with compression, as well as color and duplex ultrasound, were performed to evaluate the deep venous system(s) from the level of the common femoral vein through the popliteal and proximal calf veins. COMPARISON:  None Available. FINDINGS: VENOUS Normal compressibility of the common femoral, superficial femoral, and popliteal veins, as well as the visualized calf veins. Visualized portions of profunda femoral vein and great saphenous vein unremarkable. No filling defects to suggest DVT on grayscale or color Doppler imaging. Doppler waveforms show normal direction of venous flow, normal respiratory plasticity and response to augmentation. OTHER None. Limitations: none IMPRESSION: Negative. Electronically Signed   By: Rolm Baptise M.D.   On: 10/07/2021 21:07   DG Chest 2 View  Result Date: 10/07/2021 CLINICAL DATA:  Right-sided pleuritic chest pain EXAM: CHEST - 2 VIEW COMPARISON:  09/05/2021 FINDINGS: The heart size and mediastinal contours are within normal limits. Both lungs are clear. The visualized skeletal structures are unremarkable. IMPRESSION: No active cardiopulmonary disease.  Electronically Signed   By: Donavan Foil M.D.   On: 10/07/2021 19:18   MR BRAIN WO CONTRAST  Result Date: 10/07/2021 CLINICAL DATA:  Sudden dizziness and weakness EXAM: MRI HEAD WITHOUT CONTRAST TECHNIQUE: Multiplanar, multiecho pulse sequences of the brain and surrounding structures were obtained without intravenous contrast. COMPARISON:  CT head 05/24/2019 FINDINGS: Brain: There is no acute intracranial hemorrhage, extra-axial fluid collection, or acute infarct. Parenchymal volume is normal. The ventricles are normal in size. A cavum septum pellucidum et vergae is noted, unchanged. Gray-white differentiation is preserved. Thereare small foci of of FLAIR signal abnormality in the right frontal and parietal  lobe subcortical white matter, nonspecific but possibly related to prior insult. There is no suspicious parenchymal signal abnormality. There is no finding suspicious for demyelinating disease. There is no mass lesion.  There is no mass effect or midline shift. Vascular: Normal flow voids. Skull and upper cervical spine: Normal marrow signal. Sinuses/Orbits: Paranasal sinuses are clear. The globes and orbits are unremarkable. Other: None. IMPRESSION: No acute intracranial pathology to explain the patient's symptoms. Electronically Signed   By: Valetta Mole M.D.   On: 10/07/2021 18:55      PROCEDURES:  Critical Care performed: No  Procedures   MEDICATIONS ORDERED IN ED: Medications  potassium chloride SA (KLOR-CON M) CR tablet 40 mEq (40 mEq Oral Given 10/07/21 1810)  metoCLOPramide (REGLAN) injection 10 mg (10 mg Intravenous Given 10/07/21 1812)  ibuprofen (ADVIL) tablet 800 mg (800 mg Oral Given 10/07/21 1811)     IMPRESSION / MDM / ASSESSMENT AND PLAN / ED COURSE  I reviewed the triage vital signs and the nursing notes.                              Differential diagnosis includes, but is not limited to, near syncope, possible orthostatic or vasovagal symptoms, paresthesias and given  she reports bilateral paresthesias would consider possible hyperventilation as an etiology though this is now largely resolved but she does report a mild left-sided headache as well.  In addition she reports a very brief sharp right-sided chest pain.  No signs or symptoms that would be suggestive of ACS however no chest pressure no ongoing or lingering chest pain.  Will screen for PE with D-dimer.  Also obtain right lower extremity ultrasound to rule out DVT though clinically it seems unlikely  Patient's presentation is most consistent with acute presentation with potential threat to life or bodily function.  The patient is on the cardiac monitor to evaluate for evidence of arrhythmia and/or significant heart rate changes.      ----------------------------------------- 6:57 PM on 10/07/2021 ----------------------------------------- Labs reviewed, CBC noted to have mild anemia.  Slight hypokalemia.  Will replete.  Doubt either of these related to her symptoms today however.  She does also have a minimally elevated D-dimer, slightly greater than threshold for risk of PE.  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  Iodine exposure in the past has had led to her having to have an epinephrine pen given to her due to severe swelling.  Given risk-benefit profile of CT angiography at this point in discussing with patient was shared medical decision making we will forego imaging with contrast given her severe allergy and also has somewhat low pretest probability for PE.  Alternatively, we will pursue ventilation/perfusion study which I have talked to radiology with and there identifying whether or not this study would be available this evening.  Patient comfortable with plan to proceed with ultrasound lower extremities to rule out DVT, if V/Q scanning available we will  proceed with such, otherwise would consider discharge with close return precautions given low pretest probability of PE with very minimally elevated D-dimer, no DVTs on lower extremity ultrasound, as well as no dyspnea, normal heart rate no hypoxia making PE very unlikely and in my mind risk of iodinated contrast outweighs benefit of CT imaging at this time.  Patient  reports headache and all symptoms resolved.  ----------------------------------------- 9:36 PM on 10/07/2021 ----------------------------------------- Confirm that ventilation/perfusion study is not available at this time but will be available tomorrow at 8 AM per radiology.  I discussed this with the patient, she is asymptomatic headache improved she feels well no ongoing concerns chest pain or shortness of breath.  Her work-up to this point very reassuring but we cannot completely exclude the presence of pulmonary embolism though this seems highly unlikely.  Discussed with patient, and given her current asymptomatic status improvement, and reassuring work-up we will plan to have her return to the emergency room at 8 AM tomorrow for perfusion ventilation study which she understands to come back for.  We did discuss considering placing her on a blood thinner overnight, but having discussed and also given clinical status and current work-up we will not plan to initiate anticoagulant unless her ventilation/perfusion scan tomorrow morning is abnormal.  Return precautions and treatment recommendations and follow-up discussed with the patient who is agreeable with the plan.  I have also sent message to our nurse navigator Caesar Chestnut requesting she contact the patient to facilitate assurance of VQ scan completion.  FINAL CLINICAL IMPRESSION(S) / ED DIAGNOSES   Final diagnoses:  Syncope, unspecified syncope type     Rx / DC Orders   ED Discharge Orders     None        Note:  This document was prepared using Dragon voice  recognition software and may include unintentional dictation errors.   Delman Kitten, MD 10/07/21 2144

## 2021-10-07 NOTE — ED Triage Notes (Signed)
ARrives via [facility] from work.   C/O sudden onset weakness, dizziness, right sided rib pain, tingling to hands and slight numbness to right leg.  Post partem 2 months.  VS wnl.  CBG:  84  20 g LAC

## 2021-10-07 NOTE — Discharge Instructions (Addendum)
Please return to the emergency room tomorrow at 8 AM.  At that point we should be able to obtain a "perfusion test" to exclude risk for blood clot in the lung.  Return to the emergency room right away if you start developing chest pain, lightheadedness, feels that he may pass out again, have shortness of breath, or other new concerns arise.  Otherwise we will expect to see you back at 8 AM tomorrow for completion of the after mentioned perfusion test.  Please follow-up closely with your primary care doctor.

## 2021-10-07 NOTE — Telephone Encounter (Signed)
Pt called our office requesting an appt to be seen for numbness, headache, feels dizzy. Advised no openings and that she needs to go to ER. Advised to have someone drive her and she said her grandfather is taking her.

## 2021-10-08 ENCOUNTER — Emergency Department (HOSPITAL_COMMUNITY): Payer: BC Managed Care – PPO

## 2021-10-08 ENCOUNTER — Other Ambulatory Visit: Payer: Self-pay

## 2021-10-08 ENCOUNTER — Emergency Department (HOSPITAL_COMMUNITY)
Admission: EM | Admit: 2021-10-08 | Discharge: 2021-10-09 | Disposition: A | Discharge status: Home / Self Care | Payer: BC Managed Care – PPO | Attending: Emergency Medicine | Admitting: Emergency Medicine

## 2021-10-08 ENCOUNTER — Emergency Department
Admission: EM | Admit: 2021-10-08 | Discharge: 2021-10-08 | Disposition: A | Discharge status: Home / Self Care | Payer: BC Managed Care – PPO | Attending: Student in an Organized Health Care Education/Training Program | Admitting: Student in an Organized Health Care Education/Training Program

## 2021-10-08 ENCOUNTER — Emergency Department: Payer: BC Managed Care – PPO

## 2021-10-08 DIAGNOSIS — R0602 Shortness of breath: Secondary | ICD-10-CM | POA: Insufficient documentation

## 2021-10-08 DIAGNOSIS — R55 Syncope and collapse: Secondary | ICD-10-CM | POA: Diagnosis not present

## 2021-10-08 DIAGNOSIS — R079 Chest pain, unspecified: Secondary | ICD-10-CM | POA: Diagnosis not present

## 2021-10-08 DIAGNOSIS — R0789 Other chest pain: Secondary | ICD-10-CM | POA: Diagnosis not present

## 2021-10-08 DIAGNOSIS — Z0389 Encounter for observation for other suspected diseases and conditions ruled out: Secondary | ICD-10-CM | POA: Diagnosis not present

## 2021-10-08 LAB — CBC
HCT: 36.8 % (ref 36.0–46.0)
Hemoglobin: 11.9 g/dL — ABNORMAL LOW (ref 12.0–15.0)
MCH: 28.1 pg (ref 26.0–34.0)
MCHC: 32.3 g/dL (ref 30.0–36.0)
MCV: 86.8 fL (ref 80.0–100.0)
Platelets: 278 10*3/uL (ref 150–400)
RBC: 4.24 MIL/uL (ref 3.87–5.11)
RDW: 17.1 % — ABNORMAL HIGH (ref 11.5–15.5)
WBC: 7 10*3/uL (ref 4.0–10.5)
nRBC: 0 % (ref 0.0–0.2)

## 2021-10-08 LAB — BASIC METABOLIC PANEL
Anion gap: 8 (ref 5–15)
Anion gap: 7 (ref 5–15)
BUN: 18 mg/dL (ref 6–20)
BUN: 17 mg/dL (ref 6–20)
CO2: 23 mmol/L (ref 22–32)
CO2: 22 mmol/L (ref 22–32)
Calcium: 8.8 mg/dL — ABNORMAL LOW (ref 8.9–10.3)
Calcium: 9.1 mg/dL (ref 8.9–10.3)
Chloride: 107 mmol/L (ref 98–111)
Chloride: 110 mmol/L (ref 98–111)
Creatinine, Ser: 0.89 mg/dL (ref 0.44–1.00)
Creatinine, Ser: 0.93 mg/dL (ref 0.44–1.00)
GFR, Estimated: >60 mL/min (ref >60)
GFR, Estimated: >60 mL/min (ref >60)
Glucose, Bld: 94 mg/dL (ref 70–99)
Glucose, Bld: 89 mg/dL (ref 70–99)
Potassium: 4 mmol/L (ref 3.5–5.1)
Potassium: 4.1 mmol/L (ref 3.5–5.1)
Sodium: 138 mmol/L (ref 135–145)
Sodium: 139 mmol/L (ref 135–145)

## 2021-10-08 LAB — TROPONIN I (HIGH SENSITIVITY): Troponin I (High Sensitivity): 3 ng/L (ref <18)

## 2021-10-08 LAB — I-STAT BETA HCG BLOOD, ED (MC, WL, AP ONLY): I-stat hCG, quantitative: <5 m[IU]/mL (ref <5)

## 2021-10-08 MED ORDER — TECHNETIUM TO 99M ALBUMIN AGGREGATED
4.0900 | Freq: Once | INTRAVENOUS | Status: AC | PRN
Start: 2021-10-08 — End: 2021-10-08
  Administered 2021-10-08: 4.09 via INTRAVENOUS

## 2021-10-08 NOTE — ED Triage Notes (Addendum)
Pt comes with c/o SOb. Pt states this started yesterday. Pt states she came to ED yesterday and was told to come back today for a scan. Pt states she has to have some kind of precision scan.  Chart reveals ventilation/perfusion study per doc notes

## 2021-10-08 NOTE — ED Provider Notes (Signed)
[facility] Provider Note    Event Date/Time   First MD Initiated Contact with Patient 10/08/21 [phone]     (approximate)   History   Shortness of Breath   HPI  Manaal Mandala is a 31 y.o. female presents the ER today for include medicine lung perfusion scan to further evaluate for PE after syncopal episode yesterday.  Says that she feels well.  No additional complaints.  Had extensive work-up yesterday otherwise unrevealing.  Not currently on anticoagulation     Physical Exam   Triage Vital Signs: ED Triage Vitals  Enc Vitals Group     BP 10/08/21 0918 122/87     Pulse Rate 10/08/21 0918 67     Resp 10/08/21 0918 17     Temp 10/08/21 0918 98.1 F (36.7 C)     Temp src --      SpO2 10/08/21 0918 99 %     Weight 10/08/21 0939 273 lb 13 oz (124.2 kg)     Height 10/08/21 0939 5\' 4"  (1.626 m)     Head Circumference --      Peak Flow --      Pain Score 10/08/21 0911 0     Pain Loc --      Pain Edu? --      Excl. in [place]? --     Most recent vital signs: Vitals:   10/08/21 0918 10/08/21 1058  BP: 122/87 120/80  Pulse: 67 70  Resp: 17 16  Temp: 98.1 F (36.7 C)   SpO2: 99% 99%     Constitutional: Alert  Eyes: Conjunctivae are normal.  Head: Atraumatic. Nose: No congestion/rhinnorhea. Mouth/Throat: Mucous membranes are moist.   Neck: Painless ROM.  Cardiovascular:   Good peripheral circulation. Respiratory: Normal respiratory effort.  No retractions.  Gastrointestinal: Soft and nontender.  Musculoskeletal:  no deformity Neurologic:  MAE spontaneously. No gross focal neurologic deficits are appreciated.  Skin:  Skin is warm, dry and intact. No rash noted. Psychiatric: Mood and affect are normal. Speech and behavior are normal.    ED Results / Procedures / Treatments   Labs (all labs ordered are listed, but only abnormal results are displayed) Labs Reviewed  BASIC METABOLIC PANEL - Abnormal; Notable for the following  components:      Result Value   Calcium 8.8 (*)    All other components within normal limits     EKG     RADIOLOGY Please see ED Course for my review and interpretation.  I personally reviewed all radiographic images ordered to evaluate for the above acute complaints and reviewed radiology reports and findings.  These findings were personally discussed with the patient.  Please see medical record for radiology report.    PROCEDURES:  Critical Care performed: No  Procedures   MEDICATIONS ORDERED IN ED: Medications  technetium albumin aggregated (MAA) injection solution 4.09 millicurie (4.09 millicuries Intravenous Contrast Given 10/08/21 1016)     IMPRESSION / MDM / ASSESSMENT AND PLAN / ED COURSE  I reviewed the triage vital signs and the nursing notes.                              Differential diagnosis includes, but is not limited to, PE, bronchitis, dehydration, electrolyte   Presented to the ER for follow-up yesterday after syncopal event for nuc med lung perfusion testing.  Will repeat potassium levels as hers were low yesterday.  She  is otherwise asymptomatic and well-appearing.   Clinical Course as of 10/08/21 1132  Tue Oct 08, 2021  1047 VQ scan normal.  Repeat potassium normal.  At this point patient does appear stable and appropriate for outpatient follow-up. [PR]    Clinical Course User Index [PR] Willy Eddy, MD      FINAL CLINICAL IMPRESSION(S) / ED DIAGNOSES   Final diagnoses:  SOB (shortness of breath)     Rx / DC Orders   ED Discharge Orders     None        Note:  This document was prepared using Dragon voice recognition software and may include unintentional dictation errors.    Willy Eddy, MD 10/08/21 [phone]

## 2021-10-08 NOTE — Discharge Instructions (Signed)
Your potassium level is normal.  Your lung study does not show any evidence of blood clot.  Be sure to drink plenty of fluids.  Follow-up with PCP.  Return for additional questions or concerns.

## 2021-10-08 NOTE — ED Provider Triage Note (Signed)
Emergency Medicine Provider Triage Evaluation Note  Shelby Obrien , a 31 y.o. female  was evaluated in triage.  Pt complains of chest pain starting 30 min ago. Described as both sharp and pressure in the middle of her chest. States she was sitting and feeding her baby when the symptoms started. Associated right arm, bilateral hand, right leg, and bilateral feet numbness.   Was just seen at [facility] yesterday for similar sx and near syncopal episode. Returned this morning for V/Q scan which was normal. She said her previous sx were mostly right sided chest pain, and today it's more in the middle.   Review of Systems  Positive: Cp, numbness, SOB Negative: Syncope, abd pain, vomiting  Physical Exam  LMP 09/29/2021 (Exact Date) Comment: Negative pregnancy test on 10/07/21 Gen:   Awake, no distress   Resp:  Normal effort  MSK:   Moves extremities without difficulty  Other:    Medical Decision Making  Medically screening exam initiated at 8:36 PM.  Appropriate orders placed.  Lenoria Beckie Busing Landini was informed that the remainder of the evaluation will be completed by another provider, this initial triage assessment does not replace that evaluation, and the importance of remaining in the ED until their evaluation is complete.  Will initiate workup to include ACS rule out   Demetrios Byron T, PA-C 10/08/21 2038

## 2021-10-08 NOTE — ED Notes (Signed)
See triage note  presents with some SOB  was seen yesterday   was told to return for a perfusion scan   states she feels like her SOB is better  afebrile  resp even and non labored

## 2021-10-08 NOTE — ED Triage Notes (Signed)
Pt began having sharp chest pain and SOB while feeding her baby thirty minutes ago.chest pain is central andright chest. Nothing makes it worse of better. Pt had episode similar to this yesterday and was seen with neg work up

## 2021-10-09 ENCOUNTER — Encounter: Payer: Self-pay | Admitting: Internal Medicine

## 2021-10-09 ENCOUNTER — Ambulatory Visit (INDEPENDENT_AMBULATORY_CARE_PROVIDER_SITE_OTHER): Payer: BC Managed Care – PPO | Admitting: Family Medicine

## 2021-10-09 ENCOUNTER — Encounter: Payer: Self-pay | Admitting: Family Medicine

## 2021-10-09 VITALS — BP 112/80 | HR 72 | Temp 98.1°F | Wt 267.0 lb

## 2021-10-09 DIAGNOSIS — O165 Unspecified maternal hypertension, complicating the puerperium: Secondary | ICD-10-CM

## 2021-10-09 DIAGNOSIS — F32A Depression, unspecified: Secondary | ICD-10-CM

## 2021-10-09 DIAGNOSIS — F419 Anxiety disorder, unspecified: Secondary | ICD-10-CM

## 2021-10-09 DIAGNOSIS — R079 Chest pain, unspecified: Secondary | ICD-10-CM

## 2021-10-09 DIAGNOSIS — E876 Hypokalemia: Secondary | ICD-10-CM | POA: Diagnosis not present

## 2021-10-09 DIAGNOSIS — R55 Syncope and collapse: Secondary | ICD-10-CM

## 2021-10-09 LAB — TROPONIN I (HIGH SENSITIVITY): Troponin I (High Sensitivity): 4 ng/L (ref <18)

## 2021-10-09 MED ORDER — SERTRALINE HCL 50 MG PO TABS: 50.0000 mg | ORAL_TABLET | Freq: Every day | ORAL | 3 refills | Status: DC

## 2021-10-09 NOTE — ED Provider Notes (Signed)
[facility] EMERGENCY DEPARTMENT Provider Note   CSN: [id number] Arrival date & time: 10/08/21  2022     History  Chief Complaint  Patient presents with   Chest Pain    Shelby Obrien is a 31 y.o. female.  The history is provided by the patient and medical records.  Chest Pain  31 year old female presenting to the ED with chest pain.  Patient states pain in the middle of her chest, described as a sharp, stabbing sensation.  Reports some shortness of breath.  States she has had episodes like this over the past few days, always begins as numbness throughout her face which travels to her arms and legs bilaterally, then chest pain will follow.  She has had extensive work-up for this the past few days including multiple laboratory studies, troponin, MRI of her brain, and VQ scan-- all of which have been negative.  I did question her about stress--she recently went back to work full-time 2 weeks ago after having a baby.  She and boyfriend are both working full-time, trading off and shifts caring for her baby when not working.  She unfortunately does not have any outside help.  She admits she is exhausted and is sleeping 5 hours or less each day.  She does have history of anxiety and depression.  Home Medications Prior to Admission medications   Medication Sig Start Date End Date Taking? Authorizing Provider  acetaminophen (TYLENOL) 500 MG tablet Take 2 tablets (1,000 mg total) by mouth every 6 (six) hours as needed for fever or headache. 07/18/21   Dominic, Courtney Heys, CNM  albuterol (VENTOLIN HFA) 108 (90 Base) MCG/ACT inhaler Inhale 2 puffs into the lungs every 6 (six) hours as needed for wheezing or shortness of breath. 08/19/21   Nelwyn Salisbury, MD  furosemide (LASIX) 20 MG tablet Take 1 tablet (20 mg total) by mouth daily. 09/05/21   Mancel Bale, MD  NIFEdipine (PROCARDIA XL) 30 MG 24 hr tablet Take 1 tablet (30 mg total) by mouth daily. 09/17/21   Mirna Mires, CNM  potassium chloride SA (KLOR-CON M) 20 MEQ tablet Take 1 tablet (20 mEq total) by mouth daily. 09/05/21   Mancel Bale, MD  Prenatal Vit-Fe Fumarate-FA (PRENATAL MULTIVITAMIN) TABS tablet Take 1 tablet by mouth daily at 12 noon.    [provider]      Allergies    Iodine, Shellfish allergy, Banana, Blueberry flavor, Other, and Raspberry    Review of Systems   Review of Systems  Cardiovascular:  Positive for chest pain.  All other systems reviewed and are negative.   Physical Exam Updated Vital Signs BP 124/82 (BP Location: Right Arm)   Pulse (!) 59   Temp 98.1 F (36.7 C) (Oral)   Resp 15   Ht 5\' 4"  (1.626 m)   Wt 124.2 kg   LMP 09/29/2021 (Exact Date) Comment: Negative pregnancy test on 10/07/21  SpO2 100%   BMI 47.00 kg/m   Physical Exam Vitals and nursing note reviewed.  Constitutional:      Appearance: She is well-developed.  HENT:     Head: Normocephalic and atraumatic.  Eyes:     Conjunctiva/sclera: Conjunctivae normal.     Pupils: Pupils are equal, round, and reactive to light.  Cardiovascular:     Rate and Rhythm: Normal rate and regular rhythm.     Heart sounds: Normal heart sounds.  Pulmonary:     Effort: Pulmonary effort is normal.  Breath sounds: Normal breath sounds.  Abdominal:     General: Bowel sounds are normal.     Palpations: Abdomen is soft.  Musculoskeletal:        General: Normal range of motion.     Cervical back: Normal range of motion.  Skin:    General: Skin is warm and dry.  Neurological:     Mental Status: She is alert and oriented to person, place, and time.     Comments: AAOx3, answering questions and following commands appropriately; equal strength UE and LE bilaterally; CN grossly intact; moves all extremities appropriately without ataxia; no focal neuro deficits or facial asymmetry appreciated     ED Results / Procedures / Treatments   Labs (all labs ordered are listed, but only abnormal results  are displayed) Labs Reviewed  CBC - Abnormal; Notable for the following components:      Result Value   Hemoglobin 11.9 (*)    RDW 17.1 (*)    All other components within normal limits  BASIC METABOLIC PANEL  I-STAT BETA HCG BLOOD, ED ([facility], [facility], [facility] ONLY)  TROPONIN I (HIGH SENSITIVITY)  TROPONIN I (HIGH SENSITIVITY)    EKG EKG Interpretation  Date/Time:  Wednesday October 09 2021 04:15:04 EDT Ventricular Rate:  60 PR Interval:    QRS Duration: 102 QT Interval:  403 QTC Calculation: 403 R Axis:   57 Text Interpretation: Sinus rhythm Low voltage, precordial leads Interpretation limited secondary to artifact Confirmed by Ripley Fraise [phone]) on 10/09/2021 4:25:11 AM  Radiology DG Chest 2 View  Result Date: 10/08/2021 CLINICAL DATA:  Chest pain. EXAM: CHEST - 2 VIEW COMPARISON:  Chest x-ray 10/07/2021 FINDINGS: The heart size and mediastinal contours are within normal limits. Both lungs are clear. The visualized skeletal structures are unremarkable. IMPRESSION: No active cardiopulmonary disease. Electronically Signed   By: Ronney Asters M.D.   On: 10/08/2021 21:08   NM Pulmonary Perfusion  Result Date: 10/08/2021 CLINICAL DATA:  PE suspected EXAM: NUCLEAR MEDICINE PERFUSION LUNG SCAN TECHNIQUE: Perfusion images were obtained in multiple projections after intravenous injection of radiopharmaceutical. Ventilation scans intentionally deferred if perfusion scan and chest x-ray adequate for interpretation during COVID 19 epidemic. RADIOPHARMACEUTICALS:  4.09 mCi Tc-22m MAA IV COMPARISON:  Chest radiograph, 10/07/2021 FINDINGS: Normal, homogeneous perfusion of the lungs bilaterally. No suspicious perfusion defect. IMPRESSION: Very low probability examination for pulmonary embolism by modified perfusion only PIOPED criteria (PE absent). Electronically Signed   By: Delanna Ahmadi M.D.   On: 10/08/2021 10:42   US Venous Img Lower Bilateral  Result Date: 10/07/2021 CLINICAL DATA:  Right leg  swelling.  Chest pain.  Postpartum. EXAM: BILATERAL LOWER EXTREMITY VENOUS DOPPLER ULTRASOUND TECHNIQUE: Gray-scale sonography with compression, as well as color and duplex ultrasound, were performed to evaluate the deep venous system(s) from the level of the common femoral vein through the popliteal and proximal calf veins. COMPARISON:  None Available. FINDINGS: VENOUS Normal compressibility of the common femoral, superficial femoral, and popliteal veins, as well as the visualized calf veins. Visualized portions of profunda femoral vein and great saphenous vein unremarkable. No filling defects to suggest DVT on grayscale or color Doppler imaging. Doppler waveforms show normal direction of venous flow, normal respiratory plasticity and response to augmentation. OTHER None. Limitations: none IMPRESSION: Negative. Electronically Signed   By: Rolm Baptise M.D.   On: 10/07/2021 21:07   DG Chest 2 View  Result Date: 10/07/2021 CLINICAL DATA:  Right-sided pleuritic chest pain EXAM: CHEST - 2 VIEW COMPARISON:  09/05/2021 FINDINGS: The heart size and mediastinal contours are within normal limits. Both lungs are clear. The visualized skeletal structures are unremarkable. IMPRESSION: No active cardiopulmonary disease. Electronically Signed   By: Donavan Foil M.D.   On: 10/07/2021 19:18   MR BRAIN WO CONTRAST  Result Date: 10/07/2021 CLINICAL DATA:  Sudden dizziness and weakness EXAM: MRI HEAD WITHOUT CONTRAST TECHNIQUE: Multiplanar, multiecho pulse sequences of the brain and surrounding structures were obtained without intravenous contrast. COMPARISON:  CT head 05/24/2019 FINDINGS: Brain: There is no acute intracranial hemorrhage, extra-axial fluid collection, or acute infarct. Parenchymal volume is normal. The ventricles are normal in size. A cavum septum pellucidum et vergae is noted, unchanged. Gray-white differentiation is preserved. Thereare small foci of of FLAIR signal abnormality in the right frontal and  parietal lobe subcortical white matter, nonspecific but possibly related to prior insult. There is no suspicious parenchymal signal abnormality. There is no finding suspicious for demyelinating disease. There is no mass lesion.  There is no mass effect or midline shift. Vascular: Normal flow voids. Skull and upper cervical spine: Normal marrow signal. Sinuses/Orbits: Paranasal sinuses are clear. The globes and orbits are unremarkable. Other: None. IMPRESSION: No acute intracranial pathology to explain the patient's symptoms. Electronically Signed   By: Valetta Mole M.D.   On: 10/07/2021 18:55    Procedures Procedures    Medications Ordered in ED Medications - No data to display  ED Course/ Medical Decision Making/ A&P                           Medical Decision Making  31 year old female presenting to the ED with chest pain.  Ongoing over the past few days.  States initially starts with numbness in the face, extending through both arms and both legs, followed by chest pain.  Today it is sharp and stabbing, more in central chest.  She has had extensive work-up for this over the past few days including multiple laboratory studies, serial troponins, MRI brain, and VQ scan of the chest, all of which have been reassuring.  She does admit to increased stress--has 44-month-old at home, recently went back to work full-time 2 weeks ago, and is getting very little sleep.  EKG today is nonischemic.  Her labs are overall reassuring without any profound anemia or electrolyte derangement.  Initial troponin is negative.  Continues to endorse ongoing pain while in the lobby but resolved by time of my evaluation.  I question if stress/anxiety may be playing a role.  Will obtain delta trop given ongoing symptoms and reassess.  6:41 AM Delta trop still pending.  Care signed out to oncoming provider.  If negative, feel she is stable for discharge home.  Final Clinical Impression(s) / ED Diagnoses Final diagnoses:   Chest pain in adult    Rx / DC Orders ED Discharge Orders     None         Larene Pickett, PA-C 10/09/21 [id number]    Ripley Fraise, MD 10/10/21 [phone]

## 2021-10-09 NOTE — Progress Notes (Signed)
   Subjective:    Patient ID: Shelby Obrien, female    DOB: [birth year], 31 y.o.   MRN: [id number]  HPI Here to follow up on ED visits on 10-07-21 and on 10-08-21 for spells of chest pains, numbness all over the body, and of almost passing out. She has had several of these spells in the past week, but on 10-07-21 one of these occurred at her job. She became very lightheaded and almost passed out. A coworker caught her and kept her from falling. At the ED her exam was normal, as was CXR and EKG. A brain MRI was clear, and dopplers of both legs were normal. She was brought back the next day so a VQ scan could be done to rule out any PE's.  This was negative. All her labs were unremarkable except an initial potassium of 3.3. Her Hgb was 11.9 and the creatinine was 0.93. She was given some potassium, and a follow up level the next day was normal at 4.1. Prior to the ED visit she had been taking Lasix from her OBGYN for ankle swelling and Nifedipine for HTN. Af the second ED visit all 3 of these medications were stopped. Of note she gave birth to her 3rd child just 2 months ago (her other children are aged 4 years and 7 years). She and her boyfriend both work at full time jobs, and they are struggling to take turns watching the children.today she feels fine but she admits to feeling very stressed out.    Review of Systems  Constitutional:  Positive for fatigue.  Respiratory: Negative.    Cardiovascular: Negative.   Gastrointestinal: Negative.   Genitourinary: Negative.   Neurological:  Positive for light-headedness. Negative for dizziness, tremors, seizures, syncope, facial asymmetry, speech difficulty, weakness, numbness and headaches.  Psychiatric/Behavioral:  Positive for decreased concentration, dysphoric mood and sleep disturbance. Negative for agitation, behavioral problems, confusion, hallucinations, self-injury and suicidal ideas. The patient is nervous/anxious.        Objective:    Physical Exam Constitutional:      General: She is not in acute distress.    Appearance: She is obese.  Cardiovascular:     Rate and Rhythm: Normal rate and regular rhythm.     Pulses: Normal pulses.     Heart sounds: Normal heart sounds.  Pulmonary:     Effort: Pulmonary effort is normal.     Breath sounds: Normal breath sounds.  Neurological:     General: No focal deficit present.     Mental Status: She is alert and oriented to person, place, and time. Mental status is at baseline.  Psychiatric:        Behavior: Behavior normal.        Thought Content: Thought content normal.     Comments: She is quite anxious            Assessment & Plan:  It seems her chest pains, numbness, and near syncope were all caused by high levels of stress. We will write her out of work from today through the weekend until next Monday to rest. She will start taking Zoloft 50 mg daily for the anxiety. Recheck in 4 weeks. We spent a total of ( 35  ) minutes reviewing records and discussing these issues.  Gershon Crane, MD

## 2021-10-09 NOTE — Discharge Instructions (Addendum)
Your cardiac work-up today was normal. Please follow-up with your primary care doctor. Return here for new concerns.

## 2021-10-11 NOTE — Progress Notes (Deleted)
Pt came in for a BP check worried it was high. Pts Bp was fine. She spoke with Isabelle Course about how she was feeling. She ordered labs.This encounter was created in error - please disregard.

## 2021-10-12 DIAGNOSIS — I10 Essential (primary) hypertension: Secondary | ICD-10-CM | POA: Diagnosis not present

## 2021-10-14 LAB — FETAL NONSTRESS TEST

## 2021-10-21 ENCOUNTER — Encounter: Payer: Self-pay | Admitting: Obstetrics

## 2021-10-21 ENCOUNTER — Ambulatory Visit (INDEPENDENT_AMBULATORY_CARE_PROVIDER_SITE_OTHER): Payer: BC Managed Care – PPO | Admitting: Obstetrics

## 2021-10-21 VITALS — BP 128/88 | Ht 64.0 in | Wt 253.0 lb

## 2021-10-21 DIAGNOSIS — Z30431 Encounter for routine checking of intrauterine contraceptive device: Secondary | ICD-10-CM | POA: Diagnosis not present

## 2021-10-21 DIAGNOSIS — O165 Unspecified maternal hypertension, complicating the puerperium: Secondary | ICD-10-CM

## 2021-11-01 ENCOUNTER — Other Ambulatory Visit: Payer: Self-pay | Admitting: Obstetrics

## 2021-11-04 NOTE — Telephone Encounter (Signed)
Per her last visit, this patient has a PCP who is now managing her hypertension.

## 2021-11-05 ENCOUNTER — Telehealth: Payer: Self-pay | Admitting: Family Medicine

## 2021-11-05 ENCOUNTER — Other Ambulatory Visit: Payer: Self-pay

## 2021-11-05 DIAGNOSIS — O165 Unspecified maternal hypertension, complicating the puerperium: Secondary | ICD-10-CM

## 2021-11-05 MED ORDER — NIFEDIPINE ER OSMOTIC RELEASE 30 MG PO TB24: 30.0000 mg | ORAL_TABLET | Freq: Every day | ORAL | 3 refills | Status: DC

## 2021-11-05 NOTE — Telephone Encounter (Signed)
One year supply for Procardia sent to [facility].  Called patient to inform prescription has been sent.

## 2021-11-05 NOTE — Telephone Encounter (Signed)
Last OV 10/09/21

## 2021-11-05 NOTE — Telephone Encounter (Signed)
Pt is calling and has an appt with dr fry on 11-12-2021 and per pt her obgyn told her to let her PCP refill her bp med NIFEdipine (PROCARDIA-XL/NIFEDICAL-XL) 30 MG 24 hr tablet  [id number] [place], [place] - [address] AT [facility] & [address] Phone:  [phone]  Fax:  [phone]

## 2021-11-05 NOTE — Telephone Encounter (Signed)
Please refill this for one year  

## 2021-11-12 ENCOUNTER — Ambulatory Visit (INDEPENDENT_AMBULATORY_CARE_PROVIDER_SITE_OTHER): Payer: BC Managed Care – PPO | Admitting: Family Medicine

## 2021-11-12 VITALS — BP 118/80 | HR 61 | Temp 98.2°F | Wt 248.0 lb

## 2021-11-12 DIAGNOSIS — F32A Depression, unspecified: Secondary | ICD-10-CM

## 2021-11-12 DIAGNOSIS — F419 Anxiety disorder, unspecified: Secondary | ICD-10-CM | POA: Diagnosis not present

## 2021-11-12 NOTE — Progress Notes (Signed)
   Subjective:    Patient ID: Shelby Obrien, female    DOB: [birth year], 31 y.o.   MRN: [id number]  HPI Here to follow up on anxiety and depression. We saw her a few weeks ago while she was having what appeared to be anxiety attacks with chest pains, SOB, etc. She was started on Zoloft 50 mg daily, and this has been very helpful. She feels better, she is happier, and she is less stressed. She is back to work and this is going well. No side effects to report. She is also sleeping better.    Review of Systems  Constitutional: Negative.   Respiratory: Negative.    Cardiovascular: Negative.   Psychiatric/Behavioral:  Negative for agitation, confusion, decreased concentration, dysphoric mood and sleep disturbance. The patient is not nervous/anxious.        Objective:   Physical Exam Constitutional:      Appearance: Normal appearance.  Cardiovascular:     Rate and Rhythm: Normal rate and regular rhythm.     Pulses: Normal pulses.     Heart sounds: Normal heart sounds.  Pulmonary:     Effort: Pulmonary effort is normal.     Breath sounds: Normal breath sounds.  Neurological:     Mental Status: She is alert.  Psychiatric:        Mood and Affect: Mood normal.        Behavior: Behavior normal.        Thought Content: Thought content normal.           Assessment & Plan:  Anxiety and depression, improved. We will stay on the current regimen. Recheck in 90 days. Gershon Crane, MD

## 2021-12-04 ENCOUNTER — Encounter: Payer: Self-pay | Admitting: Internal Medicine

## 2021-12-23 ENCOUNTER — Encounter: Payer: Self-pay | Admitting: Internal Medicine

## 2022-01-09 ENCOUNTER — Encounter (HOSPITAL_COMMUNITY): Payer: Self-pay

## 2022-01-09 ENCOUNTER — Ambulatory Visit (HOSPITAL_COMMUNITY)
Admission: EM | Admit: 2022-01-09 | Discharge: 2022-01-09 | Disposition: A | Discharge status: Home / Self Care | Payer: BC Managed Care – PPO | Attending: Family | Admitting: Family

## 2022-01-09 DIAGNOSIS — J069 Acute upper respiratory infection, unspecified: Secondary | ICD-10-CM | POA: Diagnosis not present

## 2022-01-09 DIAGNOSIS — Z20822 Contact with and (suspected) exposure to covid-19: Secondary | ICD-10-CM | POA: Diagnosis not present

## 2022-01-09 DIAGNOSIS — R519 Headache, unspecified: Secondary | ICD-10-CM | POA: Diagnosis not present

## 2022-01-09 LAB — POC INFLUENZA A AND B ANTIGEN (URGENT CARE ONLY)
INFLUENZA A ANTIGEN, POC: NEGATIVE
INFLUENZA B ANTIGEN, POC: NEGATIVE

## 2022-01-09 LAB — SARS CORONAVIRUS 2 BY RT PCR: SARS Coronavirus 2 by RT PCR: NEGATIVE

## 2022-01-09 NOTE — Discharge Instructions (Addendum)
Recommend take OTC Tylenol 1000mg  every 8 hours as needed for headache/body aches. May take OTC Delsym or plain Robitussin at night to help with cough. May also take OTC Benadryl 25 to 50mg  at night to help sleep. Rest. Push fluids. May continue Albuterol inhaler 2 puffs every 6 hours as needed for cough or wheezing. Follow-up pending COVID test results.

## 2022-01-09 NOTE — ED Provider Notes (Signed)
[facility]    CSN: [id number] Arrival date & time: 01/09/22  1914      History   Chief Complaint Chief Complaint  Patient presents with   Sore Throat   Cough   Neck Pain    HPI Shelby Obrien is a 31 y.o. female.   31 year old female presents with bodyaches, chills and nasal congestion that started yesterday. Today, felt worse with sore throat, headache, sinus pressure, cough and nausea. Denies any fever or vomiting. Has not taken any medication yet for symptoms. Her 21 month old was recently sick as well as her boyfriend with similar symptoms. Other chronic health issues include HTN and asthma. Currently on Nifedipine and Zoloft daily. Using Albuterol inhaler frequently the past 2 days.   The history is provided by the patient.    Past Medical History:  Diagnosis Date   Anemia affecting pregnancy in third trimester    Asthma    Migraines    Obesity    BMI>40   Rapid palpitations 03/01/2021   Tachycardia, unspecified     Patient Active Problem List   Diagnosis Date Noted   Postpartum hypertension 09/08/2021   Vaginal delivery 08/05/2021   Symptomatic anemia 07/05/2021   Eczema 06/01/2019   Anxiety and depression 09/04/2017   Maternal obesity affecting pregnancy, antepartum 02/26/2017   Maternal morbid obesity, antepartum (HCC) 02/26/2017   Migraines 10/07/2016   Asthma 10/07/2016    Past Surgical History:  Procedure Laterality Date   WISDOM TOOTH EXTRACTION      OB History     Gravida  4   Para  3   Term  2   Preterm  1   AB  1   Living  3      SAB  1   IAB      Ectopic      Multiple  0   Live Births  3            Home Medications    Prior to Admission medications   Medication Sig Start Date End Date Taking? Authorizing Provider  albuterol (VENTOLIN HFA) 108 (90 Base) MCG/ACT inhaler Inhale 2 puffs into the lungs every 6 (six) hours as needed for wheezing or shortness of breath. 08/19/21   Nelwyn Salisbury, MD   NIFEdipine (PROCARDIA-XL/NIFEDICAL-XL) 30 MG 24 hr tablet Take 1 tablet (30 mg total) by mouth daily. 11/05/21   Nelwyn Salisbury, MD  sertraline (ZOLOFT) 50 MG tablet Take 1 tablet (50 mg total) by mouth daily. 10/09/21   Nelwyn Salisbury, MD    Family History Family History  Problem Relation Age of Onset   Cervical cancer Mother    Diabetes Sister    Diabetes Paternal Uncle    Diabetes Maternal Grandmother    Heart disease Maternal Grandmother    Diabetes Paternal Grandmother    Heart disease Paternal Grandmother     Social History Social History   Tobacco Use   Smoking status: Former    Packs/day: 0.00    Types: Cigarettes    Quit date: 01/02/2021    Years since quitting: 1.0   Smokeless tobacco: Never  Vaping Use   Vaping Use: Never used  Substance Use Topics   Alcohol use: No   Drug use: No     Allergies   Iodine, Shellfish allergy, Banana, Blueberry flavor, Other, and Raspberry   Review of Systems Review of Systems  Constitutional:  Positive for activity change, appetite change and fatigue.  Negative for diaphoresis and fever.  HENT:  Positive for congestion, postnasal drip, rhinorrhea, sinus pressure and sore throat. Negative for ear discharge, ear pain, mouth sores, nosebleeds and trouble swallowing.   Eyes:  Negative for pain, discharge, redness and itching.  Respiratory:  Positive for cough and shortness of breath.   Cardiovascular:  Negative for chest pain and palpitations.  Gastrointestinal:  Positive for nausea. Negative for vomiting.  Musculoskeletal:  Positive for arthralgias and myalgias. Negative for neck pain and neck stiffness.  Skin:  Negative for color change and rash.  Allergic/Immunologic: Positive for environmental allergies and food allergies. Negative for immunocompromised state.  Neurological:  Positive for headaches. Negative for dizziness, tremors, seizures, syncope, speech difficulty and numbness.  Hematological:  Negative for adenopathy. Does  not bruise/bleed easily.  Psychiatric/Behavioral:  Positive for sleep disturbance.      Physical Exam Triage Vital Signs ED Triage Vitals  Enc Vitals Group     BP 01/09/22 1945 (!) 143/101     Pulse Rate 01/09/22 1945 67     Resp 01/09/22 1945 16     Temp 01/09/22 1945 97.6 F (36.4 C)     Temp Source 01/09/22 1945 Oral     SpO2 01/09/22 1945 96 %     Weight --      Height 01/09/22 1947 5\' 3"  (1.6 m)     Head Circumference --      Peak Flow --      Pain Score 01/09/22 1947 9     Pain Loc --      Pain Edu? --      Excl. in GC? --    No data found.  Updated Vital Signs BP (!) 143/101 (BP Location: Right Arm)   Pulse 67   Temp 97.6 F (36.4 C) (Oral)   Resp 16   Ht 5\' 3"  (1.6 m)   LMP 01/07/2022   SpO2 96%   BMI 43.93 kg/m   Visual Acuity Right Eye Distance:   Left Eye Distance:   Bilateral Distance:    Right Eye Near:   Left Eye Near:    Bilateral Near:     Physical Exam Vitals and nursing note reviewed.  Constitutional:      General: She is awake. She is not in acute distress.    Appearance: She is well-developed and overweight. She is ill-appearing.     Comments: She is sitting on the exam table in no acute distress but appears tired and ill.   HENT:     Head: Normocephalic and atraumatic.     Right Ear: Hearing, tympanic membrane, ear canal and external ear normal.     Left Ear: Hearing, tympanic membrane, ear canal and external ear normal.     Nose: Congestion present.     Right Sinus: Maxillary sinus tenderness and frontal sinus tenderness present.     Left Sinus: Maxillary sinus tenderness and frontal sinus tenderness present.     Mouth/Throat:     Lips: Pink.     Mouth: Mucous membranes are moist.     Pharynx: Uvula midline. Posterior oropharyngeal erythema present. No pharyngeal swelling, oropharyngeal exudate or uvula swelling.  Eyes:     Extraocular Movements: Extraocular movements intact.     Conjunctiva/sclera: Conjunctivae normal.   Cardiovascular:     Rate and Rhythm: Normal rate and regular rhythm.     Heart sounds: Normal heart sounds. No murmur heard. Pulmonary:     Effort: Pulmonary effort is normal. No respiratory distress.  Breath sounds: Normal breath sounds and air entry. No decreased air movement. No decreased breath sounds, wheezing, rhonchi or rales.  Musculoskeletal:     Cervical back: Normal range of motion and neck supple.  Lymphadenopathy:     Cervical: No cervical adenopathy.  Skin:    General: Skin is warm and dry.     Capillary Refill: Capillary refill takes less than 2 seconds.  Neurological:     General: No focal deficit present.     Mental Status: She is alert and oriented to person, place, and time.  Psychiatric:        Mood and Affect: Mood normal.        Behavior: Behavior normal. Behavior is cooperative.        Thought Content: Thought content normal.        Judgment: Judgment normal.      UC Treatments / Results  Labs (all labs ordered are listed, but only abnormal results are displayed) Labs Reviewed  SARS CORONAVIRUS 2 BY RT PCR  POC INFLUENZA A AND B ANTIGEN (URGENT CARE ONLY)    EKG   Radiology No results found.  Procedures Procedures (including critical care time)  Medications Ordered in UC Medications - No data to display  Initial Impression / Assessment and Plan / UC Course  I have reviewed the triage vital signs and the nursing notes.  Pertinent labs & imaging results that were available during my care of the patient were reviewed by me and considered in my medical decision making (see chart for details).     Reviewed negative rapid Influenza test with patient. Will send COVID test- will receive results later this evening. If positive, since she has underlying HTN and asthma, she would qualify for antiviral treatment.  Discussed that she probably has a viral illness. Recommend take OTC Tylenol 1000mg  every 8 hours as needed for headache/body aches. May  take OTC Delsym or plain Robitussin DM at night to help with cough- should not interfere with other medications. May take OTC Bendryl 25 to 50mg  at night to help with sleep and drainage. Continue Albuterol inhaler 2 puffs every 6 hours as needed for cough or wheezing. Rest. Push fluids. Note written for work. Follow-up pending COVID test results.    Final Clinical Impressions(s) / UC Diagnoses   Final diagnoses:  Viral URI with cough  Frontal headache     Discharge Instructions      Recommend take OTC Tylenol 1000mg  every 8 hours as needed for headache/body aches. May take OTC Delsym or plain Robitussin at night to help with cough. May also take OTC Benadryl 25 to 50mg  at night to help sleep. Rest. Push fluids. May continue Albuterol inhaler 2 puffs every 6 hours as needed for cough or wheezing. Follow-up pending COVID test results.     ED Prescriptions   None    PDMP not reviewed this encounter.   , NP 01/10/22 1037

## 2022-01-09 NOTE — ED Triage Notes (Signed)
Pt is here for body aches, sore throat nausea, chills cough, nausea x1day. Pt denies fever

## 2022-02-04 ENCOUNTER — Ambulatory Visit: Payer: BC Managed Care – PPO | Admitting: Family Medicine

## 2022-02-07 ENCOUNTER — Ambulatory Visit (INDEPENDENT_AMBULATORY_CARE_PROVIDER_SITE_OTHER): Payer: BC Managed Care – PPO | Admitting: Family Medicine

## 2022-02-07 ENCOUNTER — Encounter: Payer: Self-pay | Admitting: Family Medicine

## 2022-02-07 VITALS — BP 120/80 | HR 66 | Temp 98.4°F | Wt 258.0 lb

## 2022-02-07 DIAGNOSIS — M25511 Pain in right shoulder: Secondary | ICD-10-CM | POA: Diagnosis not present

## 2022-02-07 MED ORDER — METHYLPREDNISOLONE 4 MG PO TBPK: ORAL_TABLET | ORAL | 0 refills | Status: DC

## 2022-02-07 NOTE — Progress Notes (Signed)
   Subjective:    Patient ID: Shelby Obrien, female    DOB: 05-Jun-1990, 31 y.o.   MRN: [id number]  HPI Here for 7 days of sharp pains in the anterior right shoulder. No recent trauma, but she does recall moving some heavy boxes into and out of a truck on her job the day before this started. She has applied ice and heat with some relief.    Review of Systems  Constitutional: Negative.   Respiratory: Negative.    Cardiovascular: Negative.   Musculoskeletal:  Positive for arthralgias.       Objective:   Physical Exam Constitutional:      General: She is not in acute distress.    Appearance: Normal appearance.  Cardiovascular:     Rate and Rhythm: Normal rate and regular rhythm.     Pulses: Normal pulses.     Heart sounds: Normal heart sounds.  Pulmonary:     Effort: Pulmonary effort is normal.     Breath sounds: Normal breath sounds.  Musculoskeletal:     Comments: The right shoulder is very tender in the subacromial area. She has full ROM but she has pain on full extension or abduction or external rotation  Neurological:     Mental Status: She is alert.           Assessment & Plan:  Shoulder bursitis. She will rest the shoulder and keep applying ice. She will take a Medrol dose pack. Recheck as needed.  Alysia Penna, MD

## 2022-02-08 ENCOUNTER — Other Ambulatory Visit: Payer: Self-pay | Admitting: Family Medicine

## 2022-02-12 ENCOUNTER — Encounter: Payer: Self-pay | Admitting: Family Medicine

## 2022-02-12 ENCOUNTER — Ambulatory Visit (INDEPENDENT_AMBULATORY_CARE_PROVIDER_SITE_OTHER): Payer: BC Managed Care – PPO | Admitting: Family Medicine

## 2022-02-12 VITALS — BP 128/80 | HR 71 | Temp 98.0°F | Wt 246.0 lb

## 2022-02-12 DIAGNOSIS — F419 Anxiety disorder, unspecified: Secondary | ICD-10-CM | POA: Diagnosis not present

## 2022-02-12 DIAGNOSIS — F32A Depression, unspecified: Secondary | ICD-10-CM

## 2022-02-12 MED ORDER — SERTRALINE HCL 50 MG PO TABS: ORAL_TABLET | ORAL | 3 refills | Status: DC

## 2022-02-12 NOTE — Progress Notes (Signed)
   Subjective:    Patient ID: Shelby Obrien, female    DOB: 05-Dec-1990, 31 y.o.   MRN: [id number]  HPI Here to follow up on anxiety and depression. She is doing well and her moods are stable. No concerns.    Review of Systems  Constitutional: Negative.   Respiratory: Negative.    Cardiovascular: Negative.   Psychiatric/Behavioral: Negative.         Objective:   Physical Exam Constitutional:      Appearance: Normal appearance.  Cardiovascular:     Rate and Rhythm: Normal rate and regular rhythm.     Pulses: Normal pulses.     Heart sounds: Normal heart sounds.  Pulmonary:     Effort: Pulmonary effort is normal.     Breath sounds: Normal breath sounds.  Neurological:     Mental Status: She is alert.  Psychiatric:        Mood and Affect: Mood normal.        Behavior: Behavior normal.        Thought Content: Thought content normal.           Assessment & Plan:  Her anxiety and depression are stable. We will stay on the current regimen. Alysia Penna, MD

## 2022-03-24 ENCOUNTER — Ambulatory Visit (INDEPENDENT_AMBULATORY_CARE_PROVIDER_SITE_OTHER): Payer: BC Managed Care – PPO | Admitting: Family Medicine

## 2022-03-24 ENCOUNTER — Encounter: Payer: Self-pay | Admitting: Family Medicine

## 2022-03-24 VITALS — BP 128/82 | HR 77 | Temp 98.4°F | Wt 273.0 lb

## 2022-03-24 DIAGNOSIS — R059 Cough, unspecified: Secondary | ICD-10-CM

## 2022-03-24 DIAGNOSIS — J020 Streptococcal pharyngitis: Secondary | ICD-10-CM

## 2022-03-24 LAB — POCT RAPID STREP A (OFFICE): Rapid Strep A Screen: POSITIVE — AB

## 2022-03-24 LAB — POC COVID19 BINAXNOW: SARS Coronavirus 2 Ag: NEGATIVE

## 2022-03-24 LAB — POCT INFLUENZA A/B
Influenza A, POC: NEGATIVE
Influenza B, POC: NEGATIVE

## 2022-03-24 MED ORDER — CEFUROXIME AXETIL 500 MG PO TABS: 500.0000 mg | ORAL_TABLET | Freq: Two times a day (BID) | ORAL | 0 refills | Status: AC

## 2022-03-24 NOTE — Progress Notes (Signed)
   Subjective:    Patient ID: Shelby Obrien, female    DOB: August 23, 1990, 31 y.o.   MRN: [id number]  HPI Here for 3 days of body aches, sneezing, and a dry cough. No NVD or fever. Using Nyquil. Most of her family have been sick lately.    Review of Systems  Constitutional: Negative.   HENT:  Positive for congestion, postnasal drip and sinus pressure. Negative for ear pain and sore throat.   Eyes: Negative.   Respiratory:  Positive for cough. Negative for shortness of breath and wheezing.   Gastrointestinal: Negative.        Objective:   Physical Exam Constitutional:      Appearance: Normal appearance.     Comments: She has nasal congestion   HENT:     Right Ear: Tympanic membrane, ear canal and external ear normal.     Left Ear: Tympanic membrane, ear canal and external ear normal.     Nose: Nose normal.     Mouth/Throat:     Pharynx: Oropharynx is clear.  Eyes:     Conjunctiva/sclera: Conjunctivae normal.  Pulmonary:     Effort: Pulmonary effort is normal.     Breath sounds: Normal breath sounds.  Lymphadenopathy:     Cervical: No cervical adenopathy.  Neurological:     Mental Status: She is alert.           Assessment & Plan:  Strep pharyngitis, treat with 10 days of Cefuroxime.  Gershon Crane, MD

## 2022-03-24 NOTE — Addendum Note (Signed)
Addended by: Carola Rhine on: 03/24/2022 08:56 AM   Modules accepted: Orders

## 2022-04-03 ENCOUNTER — Ambulatory Visit (HOSPITAL_COMMUNITY)
Admission: EM | Admit: 2022-04-03 | Discharge: 2022-04-03 | Disposition: A | Discharge status: Home / Self Care | Payer: BC Managed Care – PPO | Attending: Family Medicine | Admitting: Family Medicine

## 2022-04-03 ENCOUNTER — Encounter (HOSPITAL_COMMUNITY): Payer: Self-pay

## 2022-04-03 ENCOUNTER — Encounter: Payer: Self-pay | Admitting: Internal Medicine

## 2022-04-03 DIAGNOSIS — U071 COVID-19: Secondary | ICD-10-CM | POA: Insufficient documentation

## 2022-04-03 DIAGNOSIS — J069 Acute upper respiratory infection, unspecified: Secondary | ICD-10-CM

## 2022-04-03 LAB — RESP PANEL BY RT-PCR (FLU A&B, COVID) ARPGX2
Influenza A by PCR: NEGATIVE
Influenza B by PCR: NEGATIVE
SARS Coronavirus 2 by RT PCR: POSITIVE — AB

## 2022-04-03 MED ORDER — PROMETHAZINE-DM 6.25-15 MG/5ML PO SYRP: 5.0000 mL | ORAL_SOLUTION | Freq: Four times a day (QID) | ORAL | 0 refills | Status: DC | PRN

## 2022-04-03 NOTE — ED Triage Notes (Signed)
Pt states she had strep throat last week and is still on antibiotics states she is now having congestion and a runny nose and body aches.

## 2022-04-03 NOTE — ED Provider Notes (Signed)
[facility]   [id number] 04/03/22 Arrival Time: 4627  ASSESSMENT & PLAN:  1. Viral URI with cough    Discussed typical duration of likely viral illness. OTC symptom care as needed.  Pending:  RESP PANEL BY RT-PCR (FLU A&B, COVID) ARPGX2   Work note provided.  Discharge Medication List as of 04/03/2022 12:51 PM     START taking these medications   Details  promethazine-dextromethorphan (PROMETHAZINE-DM) 6.25-15 MG/5ML syrup Take 5 mLs by mouth 4 (four) times daily as needed for cough., Starting Thu 04/03/2022, Normal         Follow-up Information     Nelwyn Salisbury, MD.   Specialty: Family Medicine Why: As needed. Contact information: [address] [place] [address] [phone]                 Reviewed expectations re: course of current medical issues. Questions answered. Outlined signs and symptoms indicating need for more acute intervention. Understanding verbalized. After Visit Summary given.   SUBJECTIVE: History from: Patient. Shelby Obrien is a 31 y.o. female. Reports: Pt states she had strep throat last week and is still on antibiotics states she is now having congestion and a runny nose and body aches. Child with same symptoms. Denies: difficulty breathing. Normal PO intake without n/v/d.  OBJECTIVE:  Vitals:   04/03/22 1235  BP: 133/83  Pulse: 70  Resp: 16  Temp: 97.9 F (36.6 C)  TempSrc: Oral  SpO2: 97%    General appearance: alert; no distress Eyes: PERRLA; EOMI; conjunctiva normal HENT: [place]; AT; with nasal congestion Neck: supple  Lungs: speaks full sentences without difficulty; unlabored; CTAB Extremities: no edema Skin: warm and dry Neurologic: normal gait Psychological: alert and cooperative; normal mood and affect  Labs:  Labs Reviewed  RESP PANEL BY RT-PCR (FLU A&B, COVID) ARPGX2     Allergies  Allergen Reactions   Iodine Anaphylaxis    Reports swelling and had to get epipen after  iodine exposure   Shellfish Allergy Anaphylaxis   Banana    Blueberry Flavor    Other     BLUEBERRIES, BANANAS, TREE NUTS, RASPBERRIES, GRAPES, POLLENS/SEASONAL.   Raspberry     Past Medical History:  Diagnosis Date   Anemia affecting pregnancy in third trimester    Asthma    Migraines    Obesity    BMI>40   Rapid palpitations 03/01/2021   Tachycardia, unspecified    Social History   Socioeconomic History   Marital status: Single    Spouse name: Not on file   Number of children: 2   Years of education: 16   Highest education level: Associate degree: academic program  Occupational History   Occupation: Multimedia programmer: [employer]  Tobacco Use   Smoking status: Former    Packs/day: 0.00    Types: Cigarettes    Quit date: 01/02/2021    Years since quitting: 1.2   Smokeless tobacco: Never  Vaping Use   Vaping Use: Never used  Substance and Sexual Activity   Alcohol use: No   Drug use: No   Sexual activity: Not Currently    Birth control/protection: I.U.D.  Other Topics Concern   Not on file  Social History Narrative   Lives with boyfriend and 2 daughters in an apartment on the 2nd floor.  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  Education: college. She is right-handed. Caffeine - one glass every few weeks. She is active with her children and  work. No smoking.    Social Determinants of Health   Financial Resource Strain: Low Risk  (11/12/2021)   Overall Financial Resource Strain (CARDIA)    Difficulty of Paying Living Expenses: Not very hard  Food Insecurity: No Food Insecurity (11/12/2021)   Hunger Vital Sign    Worried About Running Out of Food in the Last Year: Never true    Ran Out of Food in the Last Year: Never true  Transportation Needs: No Transportation Needs (11/12/2021)   PRAPARE - Administrator, Civil Service (Medical): No    Lack of Transportation (Non-Medical): No  Physical Activity: Insufficiently Active (11/12/2021)   Exercise Vital Sign     Days of Exercise per Week: 1 day    Minutes of Exercise per Session: 30 min  Stress: No Stress Concern Present (11/12/2021)   [employer] of Occupational Health - Occupational Stress Questionnaire    Feeling of Stress : Only a little  Social Connections: Socially Isolated (11/12/2021)   Social Connection and Isolation Panel [NHANES]    Frequency of Communication with Friends and Family: More than three times a week    Frequency of Social Gatherings with Friends and Family: Twice a week    Attends Religious Services: Never    Database administrator or Organizations: No    Attends Engineer, structural: Not on file    Marital Status: Never married  Intimate Partner Violence: Not on file   Family History  Problem Relation Age of Onset   Cervical cancer Mother    Diabetes Sister    Diabetes Paternal Uncle    Diabetes Maternal Grandmother    Heart disease Maternal Grandmother    Diabetes Paternal Grandmother    Heart disease Paternal Grandmother    Past Surgical History:  Procedure Laterality Date   WISDOM TOOTH EXTRACTION       Mardella Layman, MD 04/03/22 1347

## 2022-04-03 NOTE — Discharge Instructions (Signed)
You have been tested for COVID-19/influenza today. If your test returns positive, you will receive a phone call from [facility] regarding your results. Negative test results are not called. Both positive and negative results area always visible on MyChart. If you do not have a MyChart account, sign up instructions are provided in your discharge papers. Please do not hesitate to contact us should you have questions or concerns.  

## 2022-05-27 ENCOUNTER — Ambulatory Visit (INDEPENDENT_AMBULATORY_CARE_PROVIDER_SITE_OTHER)
Admission: RE | Admit: 2022-05-27 | Discharge: 2022-05-27 | Disposition: A | Discharge status: Home / Self Care | Payer: BC Managed Care – PPO | Source: Ambulatory Visit | Attending: Family Medicine | Admitting: Family Medicine

## 2022-05-27 ENCOUNTER — Ambulatory Visit (INDEPENDENT_AMBULATORY_CARE_PROVIDER_SITE_OTHER): Payer: BC Managed Care – PPO | Admitting: Family Medicine

## 2022-05-27 ENCOUNTER — Encounter: Payer: Self-pay | Admitting: Family Medicine

## 2022-05-27 VITALS — BP 120/84 | HR 78 | Temp 98.0°F | Wt 284.0 lb

## 2022-05-27 DIAGNOSIS — M546 Pain in thoracic spine: Secondary | ICD-10-CM

## 2022-05-27 DIAGNOSIS — J069 Acute upper respiratory infection, unspecified: Secondary | ICD-10-CM | POA: Diagnosis not present

## 2022-05-27 DIAGNOSIS — M542 Cervicalgia: Secondary | ICD-10-CM

## 2022-05-27 MED ORDER — METHYLPREDNISOLONE 4 MG PO TBPK: ORAL_TABLET | ORAL | 0 refills | Status: DC

## 2022-05-27 MED ORDER — CYCLOBENZAPRINE HCL 10 MG PO TABS: 10.0000 mg | ORAL_TABLET | Freq: Three times a day (TID) | ORAL | 1 refills | Status: DC | PRN

## 2022-05-27 NOTE — Progress Notes (Signed)
   Subjective:    Patient ID: Shelby Obrien, female    DOB: Jun 12, 1990, 32 y.o.   MRN: [id number]  HPI Here for several issues. First about 2 weeks ago she developed pain in the neck and in the middle back that comes and goes. No pain radiates down the arms, but she does have numbness and tingling in both arms and both hands. No hx of trauma. Also 3 days ago she developed a ST and a dry cough. No fever. She has used her albuterol inhaler a few times.    Review of Systems  Constitutional: Negative.   HENT:  Positive for congestion, postnasal drip and sore throat. Negative for ear pain and sinus pressure.   Eyes: Negative.   Respiratory:  Positive for cough. Negative for shortness of breath and wheezing.   Musculoskeletal:  Positive for back pain and neck pain.  Neurological:  Positive for numbness. Negative for weakness.       Objective:   Physical Exam Constitutional:      Appearance: Normal appearance. She is not ill-appearing.  HENT:     Right Ear: Tympanic membrane, ear canal and external ear normal.     Left Ear: Tympanic membrane, ear canal and external ear normal.     Nose: Nose normal.     Mouth/Throat:     Pharynx: Oropharynx is clear.  Eyes:     Conjunctiva/sclera: Conjunctivae normal.  Pulmonary:     Effort: Pulmonary effort is normal.     Breath sounds: Normal breath sounds.  Musculoskeletal:     Comments: She is tender in the posterior neck and along the thoracic spine. The spine has full ROM but she has pain with flexion, extension, and rotation  Lymphadenopathy:     Cervical: No cervical adenopathy.  Neurological:     Mental Status: She is alert.           Assessment & Plan:  She has neck and thoracic spine pain, likely from degenerative discs. She will try a Medrol dose pack and Flexeril for relief. Get Xrays of the cervical and thoracic spines. She also has a viral URI. She can drink fluids and take Delsym as needed.  Alysia Penna, MD

## 2022-06-04 ENCOUNTER — Other Ambulatory Visit: Payer: Self-pay

## 2022-06-04 DIAGNOSIS — M542 Cervicalgia: Secondary | ICD-10-CM

## 2022-06-04 DIAGNOSIS — M546 Pain in thoracic spine: Secondary | ICD-10-CM

## 2022-06-09 ENCOUNTER — Ambulatory Visit (HOSPITAL_COMMUNITY)
Admission: RE | Admit: 2022-06-09 | Discharge: 2022-06-09 | Disposition: A | Discharge status: Home / Self Care | Payer: BC Managed Care – PPO | Source: Ambulatory Visit | Attending: Internal Medicine | Admitting: Internal Medicine

## 2022-06-09 ENCOUNTER — Encounter (HOSPITAL_COMMUNITY): Payer: Self-pay

## 2022-06-09 VITALS — BP 143/95 | HR 105 | Temp 98.9°F | Resp 18

## 2022-06-09 DIAGNOSIS — R519 Headache, unspecified: Secondary | ICD-10-CM | POA: Diagnosis not present

## 2022-06-09 DIAGNOSIS — Z1152 Encounter for screening for COVID-19: Secondary | ICD-10-CM | POA: Insufficient documentation

## 2022-06-09 DIAGNOSIS — R059 Cough, unspecified: Secondary | ICD-10-CM | POA: Diagnosis not present

## 2022-06-09 DIAGNOSIS — Z87891 Personal history of nicotine dependence: Secondary | ICD-10-CM | POA: Diagnosis not present

## 2022-06-09 DIAGNOSIS — J4521 Mild intermittent asthma with (acute) exacerbation: Secondary | ICD-10-CM | POA: Insufficient documentation

## 2022-06-09 DIAGNOSIS — Z8709 Personal history of other diseases of the respiratory system: Secondary | ICD-10-CM | POA: Diagnosis not present

## 2022-06-09 DIAGNOSIS — J069 Acute upper respiratory infection, unspecified: Secondary | ICD-10-CM | POA: Diagnosis not present

## 2022-06-09 MED ORDER — ALBUTEROL SULFATE HFA 108 (90 BASE) MCG/ACT IN AERS: 2.0000 | INHALATION_SPRAY | Freq: Four times a day (QID) | RESPIRATORY_TRACT | 1 refills | Status: DC | PRN

## 2022-06-09 MED ORDER — DEXAMETHASONE SODIUM PHOSPHATE 10 MG/ML IJ SOLN
10.0000 mg | Freq: Once | INTRAMUSCULAR | Status: AC
  Administered 2022-06-09: 10 mg via INTRAMUSCULAR

## 2022-06-09 MED ORDER — BENZONATATE 100 MG PO CAPS: 100.0000 mg | ORAL_CAPSULE | Freq: Three times a day (TID) | ORAL | 0 refills | Status: DC

## 2022-06-09 MED ORDER — PROMETHAZINE-DM 6.25-15 MG/5ML PO SYRP: 5.0000 mL | ORAL_SOLUTION | Freq: Every evening | ORAL | 0 refills | Status: DC | PRN

## 2022-06-09 MED ORDER — DEXAMETHASONE SODIUM PHOSPHATE 10 MG/ML IJ SOLN
INTRAMUSCULAR | Status: AC
  Filled 2022-06-09: qty 1

## 2022-06-09 MED ORDER — GUAIFENESIN ER 1200 MG PO TB12: 1200.0000 mg | ORAL_TABLET | Freq: Two times a day (BID) | ORAL | 0 refills | Status: DC

## 2022-06-09 NOTE — Discharge Instructions (Signed)
You have a viral upper respiratory infection.  COVID-19 testing is pending. We will call you with results if positive. If your COVID test is positive, you must stay at home until day 6 of symptoms. On day 6, you may go out into public and go back to work, but you must wear a mask until day 11 of symptoms to prevent spread to others.  I gave you a shot of steroid in the clinic to help with your shortness of breath and headache. Do not take motrin (Ibuprofen) or other NSAID containing medicines until tomorrow morning.  Use the following medicines to help with symptoms: - Plain Mucinex (guaifenesin) over the counter as directed every 12 hours to thin mucous so that you are able to get it out of your body easier. Drink plenty of water while taking this medication so that it works well in your body (at least 8 cups a day).  - Tylenol 1,070m and/or ibuprofen [id number] 6 hours with food as needed for aches/pains or fever/chills.  - Tessalon perles every 8 hours as needed for cough. - Take Promethazine DM cough medication to help with your cough at nighttime so that you are able to sleep. Do not drive, drink alcohol, or go to work while taking this medication since it can make you sleepy. Only take this at nighttime.   1 tablespoon of honey in warm water and/or salt water gargles may also help with symptoms. Humidifier to your room will help add water to the air and reduce coughing.  If you develop any new or worsening symptoms, please return.  If your symptoms are severe, please go to the emergency room.  Follow-up with your primary care provider for further evaluation and management of your symptoms as well as ongoing wellness visits.  I hope you feel better!

## 2022-06-09 NOTE — ED Provider Notes (Signed)
[place]    CSN: [id number] Arrival date & time: 06/09/22  1623      History   Chief Complaint Chief Complaint  Patient presents with   Cough    Cough,  runny nose, sneezingI have been using my inhaler all weekend.No sense of smell - Entered by patient    HPI Shelby Obrien is a 32 y.o. female.   Patient presents to urgent care for evaluation of cough, nasal congestion, fever/chills, sore throat, generalized fatigue, generalized bodyaches, and shortness of breath that abruptly started 3 days ago on Saturday, February 10th 2024 (2 days ago).  She states she went to work this morning despite feeling sick and had to go home because she started to not be able to smell anything and experience significant fever, chills, and fatigue.  She states she is having hard time breathing through her nose due to significant nasal congestion.  Reports generalized headache without dizziness, vision changes, photophobia, or phonophobia.  She is currently slightly short of breath and reports a history of asthma.  She is currently using her albuterol inhaler as needed and states that this has been helping at home.  Last use of albuterol inhaler was last night with relief of shortness of breath and cough.  Cough is dry/nonproductive and worse at nighttime.  Denies leg swelling and orthopnea, nausea, vomiting, dizziness, abdominal pain, back pain, and urinary symptoms.  She has been using over-the-counter medications to help with symptoms without relief.  Denies recent antibiotic or steroid use.   Cough   Past Medical History:  Diagnosis Date   Anemia affecting pregnancy in third trimester    Asthma    Migraines    Obesity    BMI>40   Rapid palpitations 03/01/2021   Tachycardia, unspecified     Patient Active Problem List   Diagnosis Date Noted   Postpartum hypertension 09/08/2021   Vaginal delivery 08/05/2021   Symptomatic anemia 07/05/2021   Eczema 06/01/2019   Anxiety and  depression 09/04/2017   Maternal obesity affecting pregnancy, antepartum 02/26/2017   Maternal morbid obesity, antepartum ([place]) 02/26/2017   Migraines 10/07/2016   Asthma 10/07/2016    Past Surgical History:  Procedure Laterality Date   WISDOM TOOTH EXTRACTION      OB History     Gravida  4   Para  3   Term  2   Preterm  1   AB  1   Living  3      SAB  1   IAB      Ectopic      Multiple  0   Live Births  3            Home Medications    Prior to Admission medications   Medication Sig Start Date End Date Taking? Authorizing Provider  benzonatate (TESSALON) 100 MG capsule Take 1 capsule (100 mg total) by mouth every 8 (eight) hours. 06/09/22  Yes Talbot Grumbling, FNP  Guaifenesin 1200 MG TB12 Take 1 tablet (1,200 mg total) by mouth in the morning and at bedtime. 06/09/22  Yes Talbot Grumbling, FNP  NIFEdipine (PROCARDIA-XL/NIFEDICAL-XL) 30 MG 24 hr tablet Take 1 tablet (30 mg total) by mouth daily. 11/05/21  Yes Laurey Morale, MD  promethazine-dextromethorphan (PROMETHAZINE-DM) 6.25-15 MG/5ML syrup Take 5 mLs by mouth at bedtime as needed for cough. 06/09/22  Yes Talbot Grumbling, FNP  sertraline (ZOLOFT) 50 MG tablet TAKE 1 TABLET(50 MG) BY MOUTH DAILY 02/12/22  Yes  Laurey Morale, MD  albuterol (VENTOLIN HFA) 108 (90 Base) MCG/ACT inhaler Inhale 2 puffs into the lungs every 6 (six) hours as needed for wheezing or shortness of breath. 06/09/22   Talbot Grumbling, FNP  cyclobenzaprine (FLEXERIL) 10 MG tablet Take 1 tablet (10 mg total) by mouth 3 (three) times daily as needed for muscle spasms. 05/27/22   Laurey Morale, MD  methylPREDNISolone (MEDROL DOSEPAK) 4 MG TBPK tablet As directed 05/27/22   Laurey Morale, MD    Family History Family History  Problem Relation Age of Onset   Cervical cancer Mother    Diabetes Sister    Diabetes Paternal Uncle    Diabetes Maternal Grandmother    Heart disease Maternal Grandmother    Diabetes  Paternal Grandmother    Heart disease Paternal Grandmother     Social History Social History   Tobacco Use   Smoking status: Former    Packs/day: 0.00    Types: Cigarettes    Quit date: 01/02/2021    Years since quitting: 1.4   Smokeless tobacco: Never  Vaping Use   Vaping Use: Never used  Substance Use Topics   Alcohol use: No   Drug use: No     Allergies   Iodine, Shellfish allergy, Banana, Blueberry flavor, Other, and Raspberry   Review of Systems Review of Systems  Respiratory:  Positive for cough.   Per HPI   Physical Exam Triage Vital Signs ED Triage Vitals [06/09/22 1637]  Enc Vitals Group     BP (!) 143/95     Pulse Rate (!) 105     Resp 18     Temp 98.9 F (37.2 C)     Temp Source Oral     SpO2 98 %     Weight      Height      Head Circumference      Peak Flow      Pain Score      Pain Loc      Pain Edu?      Excl. in [place]?    No data found.  Updated Vital Signs BP (!) 143/95 (BP Location: Right Arm)   Pulse (!) 105   Temp 98.9 F (37.2 C) (Oral)   Resp 18   LMP 06/05/2022 Comment: irregular periods.  SpO2 98%   Visual Acuity Right Eye Distance:   Left Eye Distance:   Bilateral Distance:    Right Eye Near:   Left Eye Near:    Bilateral Near:     Physical Exam Vitals and nursing note reviewed.  Constitutional:      Appearance: She is ill-appearing. She is not toxic-appearing.  HENT:     Head: Normocephalic and atraumatic.     Right Ear: Hearing, tympanic membrane, ear canal and external ear normal.     Left Ear: Hearing, tympanic membrane, ear canal and external ear normal.     Nose: Congestion present.     Mouth/Throat:     Lips: Pink.     Mouth: Mucous membranes are moist. No injury.     Tongue: No lesions. Tongue does not deviate from midline.     Palate: No mass and lesions.     Pharynx: Oropharynx is clear. Uvula midline. Posterior oropharyngeal erythema present. No pharyngeal swelling, oropharyngeal exudate or uvula  swelling.     Tonsils: No tonsillar exudate or tonsillar abscesses.  Eyes:     General: Lids are normal. Vision grossly intact. Gaze aligned appropriately.  Right eye: No discharge.        Left eye: No discharge.     Extraocular Movements: Extraocular movements intact.     Conjunctiva/sclera: Conjunctivae normal.  Cardiovascular:     Rate and Rhythm: Normal rate and regular rhythm.     Heart sounds: Normal heart sounds, S1 normal and S2 normal.  Pulmonary:     Effort: Pulmonary effort is normal. No respiratory distress.     Breath sounds: Normal breath sounds and air entry. No wheezing, rhonchi or rales.     Comments: Decreased breath sounds throughout without adventitious sounds.  No respiratory distress, accessory muscle usage, or tripoding. Musculoskeletal:     Cervical back: Neck supple.  Lymphadenopathy:     Cervical: Cervical adenopathy present.  Skin:    General: Skin is warm and dry.     Capillary Refill: Capillary refill takes less than 2 seconds.     Findings: No rash.  Neurological:     General: No focal deficit present.     Mental Status: She is alert and oriented to person, place, and time. Mental status is at baseline.     Cranial Nerves: No dysarthria or facial asymmetry.  Psychiatric:        Mood and Affect: Mood normal.        Speech: Speech normal.        Behavior: Behavior normal.        Thought Content: Thought content normal.        Judgment: Judgment normal.      UC Treatments / Results  Labs (all labs ordered are listed, but only abnormal results are displayed) Labs Reviewed - No data to display  EKG   Radiology No results found.  Procedures Procedures (including critical care time)  Medications Ordered in UC Medications  dexamethasone (DECADRON) injection 10 mg (has no administration in time range)    Initial Impression / Assessment and Plan / UC Course  I have reviewed the triage vital signs and the nursing notes.  Pertinent  labs & imaging results that were available during my care of the patient were reviewed by me and considered in my medical decision making (see chart for details).   1. Viral URI with cough Symptoms and physical exam consistent with a viral upper respiratory tract infection that will likely resolve with rest, fluids, and prescriptions for symptomatic relief. Deferred imaging based on stable cardiopulmonary exam and hemodynamically stable vital signs.  COVID-19 testing is pending.  We will call patient if this is positive.  Quarantine guidelines discussed. Currently on day 3 of symptoms and does qualify for antiviral therapy.  GFR from June 2023 is greater than 60.  She may have Paxlovid should she test positive for COVID-19.  Patient given dexamethasone 10 mg IM in clinic today for headache and shortness of breath.   Tessalon Perles, Promethazine DM, and guaifenesin sent to pharmacy for symptomatic relief to be taken as prescribed.  May use albuterol as needed for cough, shortness of breath, and wheeze at home.  May continue taking over the counter medications as directed for further symptomatic relief.  Drowsiness precautions discussed regarding promethazine DM prescription.  Nonpharmacologic interventions for symptom relief provided and after visit summary below. Advised to push fluids to stay well hydrated while recovering from viral illness.   Discussed physical exam and available lab work findings in clinic with patient.  Counseled patient regarding appropriate use of medications and potential side effects for all medications recommended or prescribed today.  Discussed red flag signs and symptoms of worsening condition,when to call the PCP office, return to urgent care, and when to seek higher level of care in the emergency department. Patient verbalizes understanding and agreement with plan. All questions answered. Patient discharged in stable condition.    Final Clinical Impressions(s) / UC  Diagnoses   Final diagnoses:  Viral URI with cough  History of asthma  Bad headache     Discharge Instructions      You have a viral upper respiratory infection.  COVID-19 testing is pending. We will call you with results if positive. If your COVID test is positive, you must stay at home until day 6 of symptoms. On day 6, you may go out into public and go back to work, but you must wear a mask until day 11 of symptoms to prevent spread to others.  I gave you a shot of steroid in the clinic to help with your shortness of breath and headache. Do not take motrin (Ibuprofen) or other NSAID containing medicines until tomorrow morning.  Use the following medicines to help with symptoms: - Plain Mucinex (guaifenesin) over the counter as directed every 12 hours to thin mucous so that you are able to get it out of your body easier. Drink plenty of water while taking this medication so that it works well in your body (at least 8 cups a day).  - Tylenol 1,057m and/or ibuprofen 6040mevery 6 hours with food as needed for aches/pains or fever/chills.  - Tessalon perles every 8 hours as needed for cough. - Take Promethazine DM cough medication to help with your cough at nighttime so that you are able to sleep. Do not drive, drink alcohol, or go to work while taking this medication since it can make you sleepy. Only take this at nighttime.   1 tablespoon of honey in warm water and/or salt water gargles may also help with symptoms. Humidifier to your room will help add water to the air and reduce coughing.  If you develop any new or worsening symptoms, please return.  If your symptoms are severe, please go to the emergency room.  Follow-up with your primary care provider for further evaluation and management of your symptoms as well as ongoing wellness visits.  I hope you feel better!   ED Prescriptions     Medication Sig Dispense Auth. Provider   albuterol (VENTOLIN HFA) 108 (90 Base) MCG/ACT  inhaler Inhale 2 puffs into the lungs every 6 (six) hours as needed for wheezing or shortness of breath. 8 g StJoella Prince, FNP   benzonatate (TESSALON) 100 MG capsule Take 1 capsule (100 mg total) by mouth every 8 (eight) hours. 21 capsule StTalbot GrumblingFNP   promethazine-dextromethorphan (PROMETHAZINE-DM) 6.25-15 MG/5ML syrup Take 5 mLs by mouth at bedtime as needed for cough. 118 mL StJoella Prince, FNP   Guaifenesin 1200 MG TB12 Take 1 tablet (1,200 mg total) by mouth in the morning and at bedtime. 14 tablet StTalbot GrumblingFNP      PDMP not reviewed this encounter.   StTalbot GrumblingFNNorth Carolina2/12/24 1751

## 2022-06-09 NOTE — ED Triage Notes (Signed)
Pt is here for a cough x3days

## 2022-06-10 LAB — SARS CORONAVIRUS 2 (TAT 6-24 HRS): SARS Coronavirus 2: NEGATIVE

## 2022-06-11 NOTE — Therapy (Signed)
OUTPATIENT PHYSICAL THERAPY CERVICAL EVALUATION   Patient Name: Shelby Obrien MRN: [id number] DOB:Dec 23, 1990, 32 y.o., female Today's Date: 06/12/2022  END OF SESSION:  PT End of Session - 06/12/22 1100     Visit Number 1    Number of Visits 9    Date for PT Re-Evaluation 07/24/22   to  account for time off work   Authorization Type no auth needed    PT Start Time 1000    PT Stop Time 1048    PT Time Calculation (min) 48 min    Activity Tolerance Patient tolerated treatment well    Behavior During Therapy [facility] for tasks assessed/performed             Past Medical History:  Diagnosis Date   Anemia affecting pregnancy in third trimester    Asthma    Migraines    Obesity    BMI>40   Rapid palpitations 03/01/2021   Tachycardia, unspecified    Past Surgical History:  Procedure Laterality Date   WISDOM TOOTH EXTRACTION     Patient Active Problem List   Diagnosis Date Noted   Postpartum hypertension 09/08/2021   Vaginal delivery 08/05/2021   Symptomatic anemia 07/05/2021   Eczema 06/01/2019   Anxiety and depression 09/04/2017   Maternal obesity affecting pregnancy, antepartum 02/26/2017   Maternal morbid obesity, antepartum ([place]) 02/26/2017   Migraines 10/07/2016   Asthma 10/07/2016    PCP: None  REFERRING PROVIDER: Dr. Alysia Penna, MD  REFERRING DIAG:  Diagnosis  M54.6 (ICD-10-CM) - Acute midline thoracic back pain  M54.2 (ICD-10-CM) - Neck pain   THERAPY DIAG:  Cervicalgia  Pain in thoracic spine  Muscle weakness (generalized)  Rationale for Evaluation and Treatment: Rehabilitation  ONSET DATE: 05/12/22  SUBJECTIVE:                                                                                                                                                                                                         SUBJECTIVE STATEMENT: It just started hurting about 1 month ago. I was also starting to get some numbness and tingling in the  hands (ulnar nerve distribution).  Started after work one day.  It has hurt in the past but I just brush it off.  I think it is work and my 46 month old is 25 pounds.    PERTINENT HISTORY:  Asthma, migraines,   PAIN:  Are you having pain? No pain at rest (off work this week)  Yes: NPRS scale: 10/10 when it does hurt.  Pain location: Midline pain from  base of the skull to the mid back  Pain description: Aching  Aggravating factors: work, Investment banker, operational the wrong way.  Relieving factors: mm relaxer, heat  PRECAUTIONS: None  WEIGHT BEARING RESTRICTIONS: No  FALLS:  Has patient fallen in last 6 months? 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    PLOF: Independent  PATIENT GOALS: stop the pain  NEXT MD VISIT: none  OBJECTIVE:   DIAGNOSTIC FINDINGS:  Xray of cspine and tspine showing only scoliosis and curvature flattening.   PATIENT SURVEYS:  NDI 17/50  COGNITION: Overall cognitive status: Within functional limits for tasks assessed  SENSATION: Numbness and tingling in the morning taking a couple hours to stop  POSTURE: forward shoulders, thoracic kyphosis, increased lumbar lordosis   PALPATION: +1 ttp bil UT Rt>Lt, bil cervical, thoracic paraspinals from C5 to T12 with tightness evident in the upper back and shoulder area   CERVICAL ROM:   Active ROM A/PROM (deg) eval  Flexion 37 - feels good  Extension 40  Right lateral flexion 32  Left lateral flexion 40  Right rotation 45  Left rotation 40   (Blank rows = not tested)  LUMBAR AROM: flexion: flat back and neck Extension: pn - hinge around T12  Side bend bil: WNL Rot: pn limited around 25% bil   THORACIC:  Seated flexion: WNL Ext: limited 50% Rotation and side bend: just tight feeling   UPPER EXTREMITY ROM: WNL: feels good stretch with overhead flexion and behind the head positions    TODAY'S TREATMENT:                                                                                                                              DATE:  06/12/22 Eval performed Discussed posture with work, in seated, and with caring for her child/lifting the child briefly: needs more instruction HEP initial instruction: per below with demo and performance of each    PATIENT EDUCATION:  Education details: per today's note Person educated: Patient Education method: Consulting civil engineer, Media planner, and Handouts Education comprehension: verbalized understanding and needs further education  HOME EXERCISE PROGRAM: Access Code: [email or web address] URL: [email or web address] Date: 06/12/2022 Prepared by: Shan Levans  Exercises - Correct Seated Posture  - 1 x daily - 7 x weekly - 1-3 sets - 10 reps - 20-30 seconds hold - Seated Scapular Retraction  - 1 x daily - 7 x weekly - 1-3 sets - 10 reps - 20-30 seconds hold - Seated Cervical Sidebending Stretch  - 1 x daily - 7 x weekly - 1-3 sets - 10 reps - 20-30 seconds hold - Seated Levator Scapulae Stretch  - 1 x daily - 7 x weekly - 1-3 sets - 10 reps - 20-30 seconds hold - Seated Mid Back Stretch  - 1 x daily - 7 x weekly - 1-3 sets - 10 reps -  20-30 seconds hold - Standing 'L' Stretch at Counter  - 1 x daily - 7 x weekly - 1-3 sets - 10 reps - 20-30 seconds hold  ASSESSMENT:  CLINICAL IMPRESSION: Patient is a 32 y.o. female who was seen today for physical therapy evaluation and treatment for her recent onset of neck and mid back pain.  She works at the [employer] and has a 39 month old son who is 25#s who she needs to lift and carry.  She demonstrates poor seated and standing posture with rounded shoulders and scapular protraction bil, and cervical and lumbar curvature changes due to mm tightness and positioning.  She has general cervical, thoracic, lumbar and hamstring stiffness and ttp which is better today as she has work off this  week.  Pt has been seen for Lt LE sciatica and is knowledgeable about stretches which is why they werent' added to the HEP today. She will benefit from PT to improve tightness and strengthen postural mm to decrease pain.    OBJECTIVE IMPAIRMENTS: decreased activity tolerance, decreased ROM, decreased strength, impaired flexibility, and pain.   ACTIVITY LIMITATIONS: carrying, lifting, bending, and sleeping  PARTICIPATION LIMITATIONS: none  PERSONAL FACTORS: Fitness are also affecting patient's functional outcome.   REHAB POTENTIAL: Good  CLINICAL DECISION MAKING: Stable/uncomplicated  EVALUATION COMPLEXITY: Low   GOALS: Goals reviewed with patient? Yes  SHORT TERM GOALS=LTGs: Target date: 07/24/22  Pt will decrease pain after work activities to 5/10 or less to demonstrate improved pain.  Baseline: 10/10 Goal status: INITIAL  2.  Pt will be ind with self stretches and strengthening for continued postural improvements Baseline:  Goal status: INITIAL  3.  Pt will demonstrate proper work station and lifting techniques to prevent pain Baseline:  Goal status: INITIAL   PLAN:  PT FREQUENCY: 1-2x/week (pt is able to do 4pm and days off only)  PT DURATION: 4 weeks  PLANNED INTERVENTIONS: Therapeutic exercises, Therapeutic activity, Neuromuscular re-education, Patient/Family education, Self Care, Joint mobilization, Dry Needling, Electrical stimulation, Moist heat, Taping, Traction, Manual therapy, and Re-evaluation  PLAN FOR NEXT SESSION: review and add stretches for general neck/back/hips, STM/MFR especially midline spine and UT, mod PRN, postural TE and lifting strength   Samia Kukla, Adrian Prince, PT 06/12/2022, 11:02 AM

## 2022-06-12 ENCOUNTER — Ambulatory Visit: Payer: BC Managed Care – PPO | Attending: Family Medicine | Admitting: Rehabilitation

## 2022-06-12 ENCOUNTER — Encounter: Payer: Self-pay | Admitting: Rehabilitation

## 2022-06-12 ENCOUNTER — Other Ambulatory Visit: Payer: Self-pay

## 2022-06-12 DIAGNOSIS — M6281 Muscle weakness (generalized): Secondary | ICD-10-CM | POA: Diagnosis not present

## 2022-06-12 DIAGNOSIS — M542 Cervicalgia: Secondary | ICD-10-CM | POA: Insufficient documentation

## 2022-06-12 DIAGNOSIS — M546 Pain in thoracic spine: Secondary | ICD-10-CM | POA: Insufficient documentation

## 2022-06-23 ENCOUNTER — Ambulatory Visit: Payer: BC Managed Care – PPO

## 2022-06-25 ENCOUNTER — Encounter: Payer: Self-pay | Admitting: Physical Therapy

## 2022-06-25 ENCOUNTER — Ambulatory Visit: Payer: BC Managed Care – PPO | Admitting: Physical Therapy

## 2022-06-25 DIAGNOSIS — M6281 Muscle weakness (generalized): Secondary | ICD-10-CM | POA: Diagnosis not present

## 2022-06-25 DIAGNOSIS — M546 Pain in thoracic spine: Secondary | ICD-10-CM | POA: Diagnosis not present

## 2022-06-25 DIAGNOSIS — M542 Cervicalgia: Secondary | ICD-10-CM | POA: Diagnosis not present

## 2022-06-25 NOTE — Therapy (Signed)
OUTPATIENT PHYSICAL THERAPY TREATMENT NOTE   Patient Name: Shelby Obrien MRN: [id number] DOB:1990/10/19, 32 y.o., female Today's Date: 06/25/2022  PCP: none REFERRING PROVIDER: Dr. Alysia Penna, MD  END OF SESSION:   PT End of Session - 06/25/22 1145     Visit Number 2    Number of Visits 9    Date for PT Re-Evaluation 07/24/22    Authorization Type no auth needed    PT Start Time [id number]    PT Stop Time 1225    PT Time Calculation (min) 40 min    Activity Tolerance Patient tolerated treatment well    Behavior During Therapy [facility] for tasks assessed/performed             Past Medical History:  Diagnosis Date   Anemia affecting pregnancy in third trimester    Asthma    Migraines    Obesity    BMI>40   Rapid palpitations 03/01/2021   Tachycardia, unspecified    Past Surgical History:  Procedure Laterality Date   WISDOM TOOTH EXTRACTION     Patient Active Problem List   Diagnosis Date Noted   Postpartum hypertension 09/08/2021   Vaginal delivery 08/05/2021   Symptomatic anemia 07/05/2021   Eczema 06/01/2019   Anxiety and depression 09/04/2017   Maternal obesity affecting pregnancy, antepartum 02/26/2017   Maternal morbid obesity, antepartum ([place]) 02/26/2017   Migraines 10/07/2016   Asthma 10/07/2016    REFERRING DIAG:  M54.6 (ICD-10-CM) - Acute midline thoracic back pain M54.2 (ICD-10-CM) - Neck pain  THERAPY DIAG:  Cervicalgia  Pain in thoracic spine  Muscle weakness (generalized)  Rationale for Evaluation and Treatment Rehabilitation  PERTINENT HISTORY: Scoliosis, Asthma, migraines  PRECAUTIONS: none  SUBJECTIVE:                                                                                                                                                                                      SUBJECTIVE STATEMENT:  The stretches are helping a little bit.    PAIN:  Are you having pain? No pain at rest (off work this week) Yes: NPRS scale:  8/10 when it does hurt.  Pain location: Midline pain from base of the skull to the mid back  Pain description: Aching  Aggravating factors: work, Investment banker, operational the wrong way.  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     PLOF: Independent   PATIENT GOALS: stop the pain   OBJECTIVE: (objective measures completed at initial evaluation unless otherwise dated)   DIAGNOSTIC  FINDINGS:  Xray of cspine and tspine showing only scoliosis and curvature flattening.    PATIENT SURVEYS:  NDI 17/50   COGNITION: Overall cognitive status: Within functional limits for tasks assessed   SENSATION: Numbness and tingling in the morning taking a couple hours to stop   POSTURE: forward shoulders, thoracic kyphosis, increased lumbar lordosis    PALPATION: +1 ttp bil UT Rt>Lt, bil cervical, thoracic paraspinals from C5 to T12 with tightness evident in the upper back and shoulder area              CERVICAL ROM:    Active ROM A/PROM (deg) eval  Flexion 37 - feels good  Extension 40  Right lateral flexion 32  Left lateral flexion 40  Right rotation 45  Left rotation 40   (Blank rows = not tested)   LUMBAR AROM: flexion: flat back and neck Extension: pn - hinge around T12  Side bend bil: WNL Rot: pn limited around 25% bil    THORACIC:  Seated flexion: WNL Ext: limited 50% Rotation and side bend: just tight feeling    UPPER EXTREMITY ROM: WNL: feels good stretch with overhead flexion and behind the head positions    TODAY'S TREATMENT:                                                                                                                              DATE:  06/25/22: NuStep L5 x 5' PT present to discuss status Standing L stretch at counter (from HEP) - 3x10" Standing green tband bil row and ext x 10 each (Pt education to  align sternum over pubic bone, feel for TA indraw with shoulder extension bil) Seated neck stretches with self overpressure: neck flexion, SB, levator trap 1x20" bil Seated lumbar hang stretch holding opposite elbows 3x10" Seated STM/TP release bil upper traps, Rt levator.  Gr III/IV thoracic extension mobs to Rt facets intrascapular region and ribcage springing. Supine lower trunk rotation 3x10" bil SL open books x 10 each side Supine neck retraction into pillow 5x5" holds  06/12/22 Eval performed Discussed posture with work, in seated, and with caring for her child/lifting the child briefly: needs more instruction HEP initial instruction: per below with demo and performance of each     PATIENT EDUCATION:  Education details: Access Code: [id number] Person educated: Patient Education method: Consulting civil engineer, Demonstration, and Handouts Education comprehension: verbalized understanding and needs further education   HOME EXERCISE PROGRAM: Access Code: [email or web address] URL: [email or web address] Date: 06/25/2022 Prepared by: Venetia Night Bennye Nix  Exercises - Correct Seated Posture  - 1 x daily - 7 x weekly - 1-3 sets - 10 reps - 20-30 seconds hold - Seated Scapular Retraction  - 1 x daily - 7 x weekly - 1-3 sets - 10 reps -   hold - Seated Cervical Sidebending Stretch  - 1 x daily - 7 x weekly - 1-3 sets - 3 reps - 20-30 seconds hold - Seated Levator Scapulae Stretch  -  1 x daily - 7 x weekly - 1-3 sets - 3 reps - 20-30 seconds hold - Seated Mid Back Stretch  - 1 x daily - 7 x weekly - 1-3 sets - 3 reps - 10 sec hold - Standing 'L' Stretch at Counter  - 1 x daily - 7 x weekly - 1-3 sets - 3 reps - 10 sec hold - Seated Flexion Stretch  - 1 x daily - 7 x weekly - 1 sets - 3 reps - 10 sec hold - Supine Lower Trunk Rotation  - 1 x daily - 7 x weekly - 1 sets - 3 reps - 10 sec hold - Sidelying Thoracic Rotation with Open Book  - 1 x daily - 7 x weekly - 1 sets - 10 reps - Standing Row with  Anchored Resistance  - 1 x daily - 7 x weekly - 1 sets - 10 reps - Standing Shoulder Extension with Resistance  - 1 x daily - 7 x weekly - 1 sets - 10 reps - Supine Cervical Retraction with Towel  - 1 x daily - 7 x weekly - 1 sets - 10 reps - 5 sec hold   ASSESSMENT:   CLINICAL IMPRESSION: Patient is on vacation from work this week.  She arrived with 8/10 pain and reported compliance with initial HEP.  PT reviewed HEP and progressed stretches, spine ROM, scapular strength and initiated cervical stabilization today with good tolerance.  She is very tight in Rt>Lt upper traps and levator and does admit using Rt side primarily with care of her 25lb 53moold.  PT educated Pt on standing posture alignment and initiated awareness of deep core with standing band UE pullbacks.  She will continue to benefit from skilled progression to meet goals.    OBJECTIVE IMPAIRMENTS: decreased activity tolerance, decreased ROM, decreased strength, impaired flexibility, and pain.    ACTIVITY LIMITATIONS: carrying, lifting, bending, and sleeping   PARTICIPATION LIMITATIONS: none   PERSONAL FACTORS: Fitness are also affecting patient's functional outcome.    REHAB POTENTIAL: Good   CLINICAL DECISION MAKING: Stable/uncomplicated   EVALUATION COMPLEXITY: Low     GOALS: Goals reviewed with patient? Yes   SHORT TERM GOALS=LTGs: Target date: 07/24/22   Pt will decrease pain after work activities to 5/10 or less to demonstrate improved pain.  Baseline: 10/10 Goal status: INITIAL   2.  Pt will be ind with self stretches and strengthening for continued postural improvements Baseline:  Goal status: INITIAL   3.  Pt will demonstrate proper work station and lifting techniques to prevent pain Baseline:  Goal status: INITIAL     PLAN:   PT FREQUENCY: 1-2x/week (pt is able to do 4pm and days off only)   PT DURATION: 4 weeks   PLANNED INTERVENTIONS: Therapeutic exercises, Therapeutic activity, Neuromuscular  re-education, Patient/Family education, Self Care, Joint mobilization, Dry Needling, Electrical stimulation, Moist heat, Taping, Traction, Manual therapy, and Re-evaluation   PLAN FOR NEXT SESSION: review HEP progression from last visit, STM/MFR especially midline spine and UT, mod PRN, postural TE and lifting strength     Kain Milosevic, PT 06/25/22 12:29 PM

## 2022-06-30 ENCOUNTER — Ambulatory Visit: Payer: BC Managed Care – PPO | Attending: Family Medicine

## 2022-06-30 DIAGNOSIS — M542 Cervicalgia: Secondary | ICD-10-CM | POA: Insufficient documentation

## 2022-06-30 DIAGNOSIS — M6281 Muscle weakness (generalized): Secondary | ICD-10-CM | POA: Diagnosis not present

## 2022-06-30 DIAGNOSIS — R252 Cramp and spasm: Secondary | ICD-10-CM | POA: Insufficient documentation

## 2022-06-30 DIAGNOSIS — M546 Pain in thoracic spine: Secondary | ICD-10-CM | POA: Diagnosis not present

## 2022-06-30 DIAGNOSIS — R293 Abnormal posture: Secondary | ICD-10-CM | POA: Diagnosis not present

## 2022-06-30 NOTE — Therapy (Signed)
OUTPATIENT PHYSICAL THERAPY TREATMENT NOTE   Patient Name: Shelby Obrien MRN: [id number] DOB:1991/01/03, 32 y.o., female Today's Date: 06/30/2022  PCP: none REFERRING PROVIDER: Dr. Alysia Penna, MD  END OF SESSION:   PT End of Session - 06/30/22 1605     Visit Number 3    Number of Visits 9    Date for PT Re-Evaluation 07/24/22    PT Start Time [id number]    PT Stop Time 1657    PT Time Calculation (min) 53 min    Activity Tolerance Patient tolerated treatment well    Behavior During Therapy [facility] for tasks assessed/performed             Past Medical History:  Diagnosis Date   Anemia affecting pregnancy in third trimester    Asthma    Migraines    Obesity    BMI>40   Rapid palpitations 03/01/2021   Tachycardia, unspecified    Past Surgical History:  Procedure Laterality Date   WISDOM TOOTH EXTRACTION     Patient Active Problem List   Diagnosis Date Noted   Postpartum hypertension 09/08/2021   Vaginal delivery 08/05/2021   Symptomatic anemia 07/05/2021   Eczema 06/01/2019   Anxiety and depression 09/04/2017   Maternal obesity affecting pregnancy, antepartum 02/26/2017   Maternal morbid obesity, antepartum ([place]) 02/26/2017   Migraines 10/07/2016   Asthma 10/07/2016    REFERRING DIAG:  M54.6 (ICD-10-CM) - Acute midline thoracic back pain M54.2 (ICD-10-CM) - Neck pain  THERAPY DIAG:  Cervicalgia  Pain in thoracic spine  Muscle weakness (generalized)  Rationale for Evaluation and Treatment Rehabilitation  PERTINENT HISTORY: Scoliosis, Asthma, migraines  PRECAUTIONS: none  SUBJECTIVE:                                                                                                                                                                                      SUBJECTIVE STATEMENT:  I'm feeling a little better.    PAIN:  Are you having pain? 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     PLOF: Independent   PATIENT GOALS: stop the pain   OBJECTIVE: (objective measures completed at initial evaluation unless otherwise dated)   DIAGNOSTIC FINDINGS:  Xray of cspine and tspine showing only scoliosis and curvature flattening.  PATIENT SURVEYS:  NDI 17/50   COGNITION: Overall cognitive status: Within functional limits for tasks assessed   SENSATION: Numbness and tingling in the morning taking a couple hours to stop   POSTURE: forward shoulders, thoracic kyphosis, increased lumbar lordosis    PALPATION: +1 ttp bil UT Rt>Lt, bil cervical, thoracic paraspinals from C5 to T12 with tightness evident in the upper back and shoulder area              CERVICAL ROM:    Active ROM A/PROM (deg) eval  Flexion 37 - feels good  Extension 40  Right lateral flexion 32  Left lateral flexion 40  Right rotation 45  Left rotation 40   (Blank rows = not tested)   LUMBAR AROM: flexion: flat back and neck Extension: pn - hinge around T12  Side bend bil: WNL Rot: pn limited around 25% bil    THORACIC:  Seated flexion: WNL Ext: limited 50% Rotation and side bend: just tight feeling    UPPER EXTREMITY ROM: WNL: feels good stretch with overhead flexion and behind the head positions    TODAY'S TREATMENT:                                                                                                                              DATE:  06/30/22: Therapeutic Exercises NuStep L5 x 7' PTA present to discuss status Seated edge of mat for bil HS stretch 3x, 20 sec holds returning therapist demo  Seated neck stretches with self overpressure: SB and levator trap 2x20"  bil Core Stabs in supine: Post pelvic tilt x10, 5 sec holds returning therapist demo; then tilt for following: clam (knees open/close) 2 x 5, alt march 2 x 5, then alt LAQ (unable to hold tilt with heel slide) all with VC's to encourage pt to hold tilt throughout Supine for figure 4 piriformis stretch with yoga strap 2x, 20 sec holds FreeMotion Machine 10# for bil scapular retraction, and then bil UE ext 7# with core engaged 2 x 10 Manual Therapy Seated STM/TP release bil upper traps and Rt levator  06/25/22: NuStep L5 x 5' PT present to discuss status Standing L stretch at counter (from HEP) - 3x10" Standing green tband bil row and ext x 10 each (Pt education to align sternum over pubic bone, feel for TA indraw with shoulder extension bil) Seated neck stretches with self overpressure: neck flexion, SB, levator trap 1x20" bil Seated lumbar hang stretch holding opposite elbows 3x10" Seated STM/TP release bil upper traps, Rt levator.  Gr III/IV thoracic extension mobs to Rt facets intrascapular region and ribcage springing. Supine lower trunk rotation 3x10" bil SL open books x 10 each side Supine neck retraction into pillow 5x5" holds  06/12/22 Eval performed Discussed posture with work, in seated, and with caring for her child/lifting the child briefly: needs more instruction HEP initial instruction: per below with demo and performance of each     PATIENT  EDUCATION:  Education details: Access Code: [id number] Person educated: Patient Education method: Explanation, Demonstration, and Handouts Education comprehension: verbalized understanding and needs further education   HOME EXERCISE PROGRAM: Access Code: [email or web address] URL: [email or web address] Date: 06/25/2022 Prepared by: Venetia Night Beuhring  Exercises - Correct Seated Posture  - 1 x daily - 7 x weekly - 1-3 sets - 10 reps - 20-30 seconds hold - Seated Scapular Retraction  - 1 x daily - 7 x weekly - 1-3 sets - 10 reps -    hold - Seated Cervical Sidebending Stretch  - 1 x daily - 7 x weekly - 1-3 sets - 3 reps - 20-30 seconds hold - Seated Levator Scapulae Stretch  - 1 x daily - 7 x weekly - 1-3 sets - 3 reps - 20-30 seconds hold - Seated Mid Back Stretch  - 1 x daily - 7 x weekly - 1-3 sets - 3 reps - 10 sec hold - Standing 'L' Stretch at Counter  - 1 x daily - 7 x weekly - 1-3 sets - 3 reps - 10 sec hold - Seated Flexion Stretch  - 1 x daily - 7 x weekly - 1 sets - 3 reps - 10 sec hold - Supine Lower Trunk Rotation  - 1 x daily - 7 x weekly - 1 sets - 3 reps - 10 sec hold - Sidelying Thoracic Rotation with Open Book  - 1 x daily - 7 x weekly - 1 sets - 10 reps - Standing Row with Anchored Resistance  - 1 x daily - 7 x weekly - 1 sets - 10 reps - Standing Shoulder Extension with Resistance  - 1 x daily - 7 x weekly - 1 sets - 10 reps - Supine Cervical Retraction with Towel  - 1 x daily - 7 x weekly - 1 sets - 10 reps - 5 sec hold   ASSESSMENT:   CLINICAL IMPRESSION: Patient returned to work yesterday after 10 days vacation.She reports yesterday she had increased pain, but is feeling better today with less pain reported at 5/10. The HEP stretches have been helping. Progressed her to include supine stabs which she was challenged by but did tolerate well demonstrating correct technique after demo. Continued with bil LE and cervical flexibility as she does still demonstrate tightness and with manual therapy for further trigger point release. Pt will benefit from continued with physical therapy to cont progress towards goals.      OBJECTIVE IMPAIRMENTS: decreased activity tolerance, decreased ROM, decreased strength, impaired flexibility, and pain.    ACTIVITY LIMITATIONS: carrying, lifting, bending, and sleeping   PARTICIPATION LIMITATIONS: none   PERSONAL FACTORS: Fitness are also affecting patient's functional outcome.    REHAB POTENTIAL: Good   CLINICAL DECISION MAKING: Stable/uncomplicated   EVALUATION  COMPLEXITY: Low     GOALS: Goals reviewed with patient? Yes   SHORT TERM GOALS=LTGs: Target date: 07/24/22   Pt will decrease pain after work activities to 5/10 or less to demonstrate improved pain.  Baseline: 10/10 Goal status: INITIAL   2.  Pt will be ind with self stretches and strengthening for continued postural improvements Baseline:  Goal status: INITIAL   3.  Pt will demonstrate proper work station and lifting techniques to prevent pain Baseline:  Goal status: INITIAL     PLAN:   PT FREQUENCY: 1-2x/week (pt is able to do 4pm and days off only)   PT DURATION: 4 weeks   PLANNED INTERVENTIONS: Therapeutic exercises, Therapeutic activity, Neuromuscular re-education, Patient/Family education,  Self Care, Joint mobilization, Dry Needling, Electrical stimulation, Moist heat, Taping, Traction, Manual therapy, and Re-evaluation   PLAN FOR NEXT SESSION: review HEP progression from last visit, STM/MFR especially midline spine and UT, mod PRN, postural TE and lifting strength; add core stabs to HEP    Collie Siad, PTA 06/30/22 5:03 PM

## 2022-07-02 ENCOUNTER — Ambulatory Visit: Payer: BC Managed Care – PPO

## 2022-07-02 DIAGNOSIS — M6281 Muscle weakness (generalized): Secondary | ICD-10-CM | POA: Diagnosis not present

## 2022-07-02 DIAGNOSIS — M542 Cervicalgia: Secondary | ICD-10-CM | POA: Diagnosis not present

## 2022-07-02 DIAGNOSIS — R293 Abnormal posture: Secondary | ICD-10-CM | POA: Diagnosis not present

## 2022-07-02 DIAGNOSIS — M546 Pain in thoracic spine: Secondary | ICD-10-CM

## 2022-07-02 DIAGNOSIS — R252 Cramp and spasm: Secondary | ICD-10-CM | POA: Diagnosis not present

## 2022-07-02 NOTE — Therapy (Signed)
OUTPATIENT PHYSICAL THERAPY TREATMENT NOTE   Patient Name: Shelby Obrien MRN: [id number] DOB:1990/05/01, 32 y.o., female Today's Date: 07/02/2022  PCP: none REFERRING PROVIDER: Dr. Alysia Penna, MD  END OF SESSION:   PT End of Session - 07/02/22 1656     Visit Number 4    Date for PT Re-Evaluation 07/24/22    PT Start Time 1614    PT Stop Time 1652    PT Time Calculation (min) 38 min    Activity Tolerance Patient tolerated treatment well    Behavior During Therapy [facility] for tasks assessed/performed              Past Medical History:  Diagnosis Date   Anemia affecting pregnancy in third trimester    Asthma    Migraines    Obesity    BMI>40   Rapid palpitations 03/01/2021   Tachycardia, unspecified    Past Surgical History:  Procedure Laterality Date   WISDOM TOOTH EXTRACTION     Patient Active Problem List   Diagnosis Date Noted   Postpartum hypertension 09/08/2021   Vaginal delivery 08/05/2021   Symptomatic anemia 07/05/2021   Eczema 06/01/2019   Anxiety and depression 09/04/2017   Maternal obesity affecting pregnancy, antepartum 02/26/2017   Maternal morbid obesity, antepartum ([place]) 02/26/2017   Migraines 10/07/2016   Asthma 10/07/2016    REFERRING DIAG:  M54.6 (ICD-10-CM) - Acute midline thoracic back pain M54.2 (ICD-10-CM) - Neck pain  THERAPY DIAG:  Cervicalgia  Pain in thoracic spine  Muscle weakness (generalized)  Rationale for Evaluation and Treatment Rehabilitation  PERTINENT HISTORY: Scoliosis, Asthma, migraines  PRECAUTIONS: none  SUBJECTIVE:                                                                                                                                                                                      SUBJECTIVE STATEMENT:  I had a busy day at work so I'm hurting today.  I want to try DN.    PAIN:  Are you having pain? 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     PLOF: Independent   PATIENT GOALS: stop the pain   OBJECTIVE: (objective measures completed at initial evaluation unless otherwise dated)   DIAGNOSTIC FINDINGS:  Xray of cspine and tspine showing only scoliosis  and curvature flattening.    PATIENT SURVEYS:  NDI 17/50   COGNITION: Overall cognitive status: Within functional limits for tasks assessed   SENSATION: Numbness and tingling in the morning taking a couple hours to stop   POSTURE: forward shoulders, thoracic kyphosis, increased lumbar lordosis    PALPATION: +1 ttp bil UT Rt>Lt, bil cervical, thoracic paraspinals from C5 to T12 with tightness evident in the upper back and shoulder area              CERVICAL ROM:    Active ROM A/PROM (deg) eval  Flexion 37 - feels good  Extension 40  Right lateral flexion 32  Left lateral flexion 40  Right rotation 45  Left rotation 40   (Blank rows = not tested)   LUMBAR AROM: flexion: flat back and neck Extension: pn - hinge around T12  Side bend bil: WNL Rot: pn limited around 25% bil    THORACIC:  Seated flexion: WNL Ext: limited 50% Rotation and side bend: just tight feeling    UPPER EXTREMITY ROM: WNL: feels good stretch with overhead flexion and behind the head positions    TODAY'S TREATMENT:     DATE:  07/02/22: Therapeutic Exercises Arm Bike : level 1x 6 minutes (3/3)- PT present to discuss progress  Cervical A/ROM 3 ways 3x20 seconds  L stretch at counter 3x20 seconds  Manual Therapy Trigger Point Dry-Needling  Treatment instructions: Expect mild to moderate muscle soreness. S/S of pneumothorax if dry needled over a lung field, and  to seek immediate medical attention should they occur. Patient verbalized understanding of these instructions and education.  Patient Consent Given: Yes Education handout provided: Yes Muscles treated: bil cervical and thoracic multifidi, bil upper traps, bil rhomboids  Treatment response/outcome: Utilized skilled palpation to identify trigger points.  During dry needling able to palpate muscle twitch and muscle elongation  Elongation and release after needling  Skilled palpation and monitoring by PT during dry needling                                                                                                                         DATE:  06/30/22: Therapeutic Exercises NuStep L5 x 7' PTA present to discuss status Seated edge of mat for bil HS stretch 3x, 20 sec holds returning therapist demo  Seated neck stretches with self overpressure: SB and levator trap 2x20" bil Core Stabs in supine: Post pelvic tilt x10, 5 sec holds returning therapist demo; then tilt for following: clam (knees open/close) 2 x 5, alt march 2 x 5, then alt LAQ (unable to hold tilt with heel slide) all with VC's to encourage pt to hold tilt throughout Supine for figure 4 piriformis stretch with yoga strap 2x, 20 sec holds FreeMotion Machine 10# for bil scapular retraction, and then bil UE ext 7# with core engaged 2 x 10 Manual Therapy Seated STM/TP release bil upper traps and Rt levator  06/25/22: NuStep L5 x 5' PT present to  discuss status Standing L stretch at counter (from HEP) - 3x10" Standing green tband bil row and ext x 10 each (Pt education to align sternum over pubic bone, feel for TA indraw with shoulder extension bil) Seated neck stretches with self overpressure: neck flexion, SB, levator trap 1x20" bil Seated lumbar hang stretch holding opposite elbows 3x10" Seated STM/TP release bil upper traps, Rt levator.  Gr III/IV thoracic extension mobs to Rt facets intrascapular region and ribcage  springing. Supine lower trunk rotation 3x10" bil SL open books x 10 each side Supine neck retraction into pillow 5x5" holds    PATIENT EDUCATION:  Education details: Access Code: [id number] Person educated: Patient Education method: Explanation, Demonstration, and Handouts Education comprehension: verbalized understanding and needs further education   HOME EXERCISE PROGRAM: Access Code: [email or web address] URL: [email or web address] Date: 06/25/2022 Prepared by: Venetia Night Beuhring  Exercises - Correct Seated Posture  - 1 x daily - 7 x weekly - 1-3 sets - 10 reps - 20-30 seconds hold - Seated Scapular Retraction  - 1 x daily - 7 x weekly - 1-3 sets - 10 reps -   hold - Seated Cervical Sidebending Stretch  - 1 x daily - 7 x weekly - 1-3 sets - 3 reps - 20-30 seconds hold - Seated Levator Scapulae Stretch  - 1 x daily - 7 x weekly - 1-3 sets - 3 reps - 20-30 seconds hold - Seated Mid Back Stretch  - 1 x daily - 7 x weekly - 1-3 sets - 3 reps - 10 sec hold - Standing 'L' Stretch at Counter  - 1 x daily - 7 x weekly - 1-3 sets - 3 reps - 10 sec hold - Seated Flexion Stretch  - 1 x daily - 7 x weekly - 1 sets - 3 reps - 10 sec hold - Supine Lower Trunk Rotation  - 1 x daily - 7 x weekly - 1 sets - 3 reps - 10 sec hold - Sidelying Thoracic Rotation with Open Book  - 1 x daily - 7 x weekly - 1 sets - 10 reps - Standing Row with Anchored Resistance  - 1 x daily - 7 x weekly - 1 sets - 10 reps - Standing Shoulder Extension with Resistance  - 1 x daily - 7 x weekly - 1 sets - 10 reps - Supine Cervical Retraction with Towel  - 1 x daily - 7 x weekly - 1 sets - 10 reps - 5 sec hold   ASSESSMENT:   CLINICAL IMPRESSION: Pt arrived with increased pain after unloading the truck at work.  Pt is independent and compliant with HEP and is doing regularly.  Session focused on flexibility and manual therapy to address trigger points and tension in neck and thoracic spine.   Pt had good response to DN with  twitch response and improved tissue motility after DN.   Pt will benefit from continued with physical therapy to cont progress towards goals.      OBJECTIVE IMPAIRMENTS: decreased activity tolerance, decreased ROM, decreased strength, impaired flexibility, and pain.    ACTIVITY LIMITATIONS: carrying, lifting, bending, and sleeping   PARTICIPATION LIMITATIONS: none   PERSONAL FACTORS: Fitness are also affecting patient's functional outcome.    REHAB POTENTIAL: Good   CLINICAL DECISION MAKING: Stable/uncomplicated   EVALUATION COMPLEXITY: Low     GOALS: Goals reviewed with patient? Yes   SHORT TERM GOALS=LTGs: Target date: 07/24/22   Pt will decrease pain after work activities to 5/10 or less  to demonstrate improved pain.  Baseline: 10/10 Goal status: INITIAL   2.  Pt will be ind with self stretches and strengthening for continued postural improvements Baseline: changing position at work and home (07/02/22) Goal status: MET   3.  Pt will demonstrate proper work station and lifting techniques to prevent pain Baseline:  Goal status: INITIAL     PLAN:   PT FREQUENCY: 1-2x/week (pt is able to do 4pm and days off only)   PT DURATION: 4 weeks   PLANNED INTERVENTIONS: Therapeutic exercises, Therapeutic activity, Neuromuscular re-education, Patient/Family education, Self Care, Joint mobilization, Dry Needling, Electrical stimulation, Moist heat, Taping, Traction, Manual therapy, and Re-evaluation   PLAN FOR NEXT SESSION: review HEP progression from last visit, STM/MFR especially midline spine and UT, mod PRN, postural TE and lifting strength; add core stabs to [employer], PT 07/02/22 4:57 PM

## 2022-07-07 ENCOUNTER — Ambulatory Visit: Payer: BC Managed Care – PPO

## 2022-07-09 ENCOUNTER — Ambulatory Visit: Payer: BC Managed Care – PPO

## 2022-07-09 DIAGNOSIS — M546 Pain in thoracic spine: Secondary | ICD-10-CM | POA: Diagnosis not present

## 2022-07-09 DIAGNOSIS — R252 Cramp and spasm: Secondary | ICD-10-CM | POA: Diagnosis not present

## 2022-07-09 DIAGNOSIS — R293 Abnormal posture: Secondary | ICD-10-CM | POA: Diagnosis not present

## 2022-07-09 DIAGNOSIS — M542 Cervicalgia: Secondary | ICD-10-CM | POA: Diagnosis not present

## 2022-07-09 DIAGNOSIS — M6281 Muscle weakness (generalized): Secondary | ICD-10-CM | POA: Diagnosis not present

## 2022-07-09 NOTE — Therapy (Signed)
OUTPATIENT PHYSICAL THERAPY TREATMENT NOTE   Patient Name: Shelby Obrien MRN: [id number] DOB:21-Dec-1990, 32 y.o., female Today's Date: 07/09/2022  PCP: none REFERRING PROVIDER: Dr. Alysia Penna, MD  END OF SESSION:   PT End of Session - 07/09/22 1619     Visit Number 5    Number of Visits 9    Date for PT Re-Evaluation 07/24/22    Authorization Type no auth needed    PT Start Time 1619    PT Stop Time 1653    PT Time Calculation (min) 34 min    Activity Tolerance Patient tolerated treatment well    Behavior During Therapy [facility] for tasks assessed/performed              Past Medical History:  Diagnosis Date   Anemia affecting pregnancy in third trimester    Asthma    Migraines    Obesity    BMI>40   Rapid palpitations 03/01/2021   Tachycardia, unspecified    Past Surgical History:  Procedure Laterality Date   WISDOM TOOTH EXTRACTION     Patient Active Problem List   Diagnosis Date Noted   Postpartum hypertension 09/08/2021   Vaginal delivery 08/05/2021   Symptomatic anemia 07/05/2021   Eczema 06/01/2019   Anxiety and depression 09/04/2017   Maternal obesity affecting pregnancy, antepartum 02/26/2017   Maternal morbid obesity, antepartum ([place]) 02/26/2017   Migraines 10/07/2016   Asthma 10/07/2016    REFERRING DIAG:  M54.6 (ICD-10-CM) - Acute midline thoracic back pain M54.2 (ICD-10-CM) - Neck pain  THERAPY DIAG:  Cervicalgia  Pain in thoracic spine  Muscle weakness (generalized)  Rationale for Evaluation and Treatment Rehabilitation  PERTINENT HISTORY: Scoliosis, Asthma, migraines  PRECAUTIONS: none  SUBJECTIVE:                                                                                                                                                                                      SUBJECTIVE STATEMENT:  Patient reports she did really well with the dry needling.  "My sciatica is hurting more than anything right now"    PAIN:   Are you having pain? 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     PLOF: Independent   PATIENT GOALS: stop the pain   OBJECTIVE: (objective measures completed at  initial evaluation unless otherwise dated)   DIAGNOSTIC FINDINGS:  Xray of cspine and tspine showing only scoliosis and curvature flattening.    PATIENT SURVEYS:  NDI 17/50   COGNITION: Overall cognitive status: Within functional limits for tasks assessed   SENSATION: Numbness and tingling in the morning taking a couple hours to stop   POSTURE: forward shoulders, thoracic kyphosis, increased lumbar lordosis    PALPATION: +1 ttp bil UT Rt>Lt, bil cervical, thoracic paraspinals from C5 to T12 with tightness evident in the upper back and shoulder area              CERVICAL ROM:    Active ROM A/PROM (deg) eval  Flexion 37 - feels good  Extension 40  Right lateral flexion 32  Left lateral flexion 40  Right rotation 45  Left rotation 40   (Blank rows = not tested)   LUMBAR AROM: flexion: flat back and neck Extension: pn - hinge around T12  Side bend bil: WNL Rot: pn limited around 25% bil    THORACIC:  Seated flexion: WNL Ext: limited 50% Rotation and side bend: just tight feeling    UPPER EXTREMITY ROM: WNL: feels good stretch with overhead flexion and behind the head positions    TODAY'S TREATMENT:     DATE:  07/09/22: Therapeutic Exercises Arm Bike : level 1x 2 minutes (2/2)- PT present to discuss progress  Standing shoulder tband: bilateral shoulder ext, rows, ER, horizontal abduction with red band x 20 each Manual  Therapy Trigger Point Dry-Needling  Treatment instructions: Expect mild to moderate muscle soreness. S/S of pneumothorax if dry needled over a lung field, and to seek immediate medical attention should they occur. Patient verbalized understanding of these instructions and education. Patient Consent Given: Yes Education handout provided: Yes Muscles treated: bil cervical and thoracic multifidi, bil upper traps,  Treatment response/outcome: Skilled palpation used to identify taut bands and trigger points.  Once identified, dry needling techniques used to treat these areas.  Twitch response ellicited along with palpable elongation of muscle.  Following treatment, patient came to sit and was able to do full c spine ROM without pain.  Mild soreness reported.   DATE:  07/02/22: Therapeutic Exercises Arm Bike : level 1x 6 minutes (3/3)- PT present to discuss progress  Cervical A/ROM 3 ways 3x20 seconds  L stretch at counter 3x20 seconds  Manual Therapy Trigger Point Dry-Needling  Treatment instructions: Expect mild to moderate muscle soreness. S/S of pneumothorax if dry needled over a lung field, and to seek immediate medical attention should they occur. Patient verbalized understanding of these instructions and education.  Patient Consent Given: Yes Education handout provided: Yes Muscles treated: bil cervical and thoracic multifidi, bil upper traps, bil rhomboids  Treatment response/outcome: Utilized skilled palpation to identify trigger points.  During dry needling able to palpate muscle twitch and muscle elongation  Elongation and release after needling  Skilled palpation and monitoring by PT during dry needling  DATE:  06/30/22: Therapeutic Exercises NuStep L5 x 7' PTA present to discuss status Seated edge of mat for bil HS stretch 3x, 20 sec holds returning therapist demo   Seated neck stretches with self overpressure: SB and levator trap 2x20" bil Core Stabs in supine: Post pelvic tilt x10, 5 sec holds returning therapist demo; then tilt for following: clam (knees open/close) 2 x 5, alt march 2 x 5, then alt LAQ (unable to hold tilt with heel slide) all with VC's to encourage pt to hold tilt throughout Supine for figure 4 piriformis stretch with yoga strap 2x, 20 sec holds FreeMotion Machine 10# for bil scapular retraction, and then bil UE ext 7# with core engaged 2 x 10 Manual Therapy Seated STM/TP release bil upper traps and Rt levator     PATIENT EDUCATION:  Education details: Access Code: [email or web address] Person educated: Patient Education method: Consulting civil engineer, Demonstration, and Handouts Education comprehension: verbalized understanding and needs further education   HOME EXERCISE PROGRAM: Access Code: [email or web address] URL: [email or web address] Date: 06/25/2022 Prepared by: Venetia Night Beuhring  Exercises - Correct Seated Posture  - 1 x daily - 7 x weekly - 1-3 sets - 10 reps - 20-30 seconds hold - Seated Scapular Retraction  - 1 x daily - 7 x weekly - 1-3 sets - 10 reps -   hold - Seated Cervical Sidebending Stretch  - 1 x daily - 7 x weekly - 1-3 sets - 3 reps - 20-30 seconds hold - Seated Levator Scapulae Stretch  - 1 x daily - 7 x weekly - 1-3 sets - 3 reps - 20-30 seconds hold - Seated Mid Back Stretch  - 1 x daily - 7 x weekly - 1-3 sets - 3 reps - 10 sec hold - Standing 'L' Stretch at Counter  - 1 x daily - 7 x weekly - 1-3 sets - 3 reps - 10 sec hold - Seated Flexion Stretch  - 1 x daily - 7 x weekly - 1 sets - 3 reps - 10 sec hold - Supine Lower Trunk Rotation  - 1 x daily - 7 x weekly - 1 sets - 3 reps - 10 sec hold - Sidelying Thoracic Rotation with Open Book  - 1 x daily - 7 x weekly - 1 sets - 10 reps - Standing Row with Anchored Resistance  - 1 x daily - 7 x weekly - 1 sets - 10 reps - Standing Shoulder Extension with Resistance  - 1 x  daily - 7 x weekly - 1 sets - 10 reps - Supine Cervical Retraction with Towel  - 1 x daily - 7 x weekly - 1 sets - 10 reps - 5 sec hold   ASSESSMENT:   CLINICAL IMPRESSION: Pt arrived with less pain in c spine and left shoulder after last dry needling session.  She actually felt her sciatic pain was mostly bothersome today.  She was able to complete all postural exercises with red band with ease and had good twitch responses during dry needling with ability to do full pain free c spine ROM following treatment  Pt will benefit from continued skilled physical therapy to cont progress towards goals.      OBJECTIVE IMPAIRMENTS: decreased activity tolerance, decreased ROM, decreased strength, impaired flexibility, and pain.    ACTIVITY LIMITATIONS: carrying, lifting, bending, and sleeping   PARTICIPATION LIMITATIONS: none   PERSONAL FACTORS: Fitness are also affecting patient's functional outcome.    REHAB POTENTIAL: Good   CLINICAL DECISION MAKING: Stable/uncomplicated  EVALUATION COMPLEXITY: Low     GOALS: Goals reviewed with patient? Yes   SHORT TERM GOALS=LTGs: Target date: 07/24/22   Pt will decrease pain after work activities to 5/10 or less to demonstrate improved pain.  Baseline: 10/10 Goal status: INITIAL   2.  Pt will be ind with self stretches and strengthening for continued postural improvements Baseline: changing position at work and home (07/02/22) Goal status: MET   3.  Pt will demonstrate proper work station and lifting techniques to prevent pain Baseline:  Goal status: INITIAL     PLAN:   PT FREQUENCY: 1-2x/week (pt is able to do 4pm and days off only)   PT DURATION: 4 weeks   PLANNED INTERVENTIONS: Therapeutic exercises, Therapeutic activity, Neuromuscular re-education, Patient/Family education, Self Care, Joint mobilization, Dry Needling, Electrical stimulation, Moist heat, Taping, Traction, Manual therapy, and Re-evaluation   PLAN FOR NEXT SESSION:  Progress HEP, STM/MFR especially midline spine and UT, mod PRN, postural TE and lifting strength; add core stabs to [employer] B. Dyanne [employer], PT 07/09/22 5:02 PM  [facility] [address] [place], [place] [address] Phone # [phone] Fax [phone]

## 2022-07-14 ENCOUNTER — Ambulatory Visit: Payer: BC Managed Care – PPO

## 2022-07-14 DIAGNOSIS — R293 Abnormal posture: Secondary | ICD-10-CM | POA: Diagnosis not present

## 2022-07-14 DIAGNOSIS — R252 Cramp and spasm: Secondary | ICD-10-CM | POA: Diagnosis not present

## 2022-07-14 DIAGNOSIS — M542 Cervicalgia: Secondary | ICD-10-CM | POA: Diagnosis not present

## 2022-07-14 DIAGNOSIS — M546 Pain in thoracic spine: Secondary | ICD-10-CM | POA: Diagnosis not present

## 2022-07-14 DIAGNOSIS — M6281 Muscle weakness (generalized): Secondary | ICD-10-CM | POA: Diagnosis not present

## 2022-07-14 NOTE — Therapy (Signed)
OUTPATIENT PHYSICAL THERAPY TREATMENT NOTE   Patient Name: Shelby Obrien MRN: [id number] DOB:[birth year], 32 y.o., female Today's Date: 07/14/2022  PCP: none REFERRING PROVIDER: Dr. Alysia Penna, MD  END OF SESSION:   PT End of Session - 07/14/22 1105     Visit Number 6    Number of Visits 9    Date for PT Re-Evaluation 07/24/22    Authorization Type no auth needed    PT Start Time 1105    PT Stop Time [id number]    PT Time Calculation (min) 43 min    Activity Tolerance Patient tolerated treatment well    Behavior During Therapy [facility] for tasks assessed/performed              Past Medical History:  Diagnosis Date   Anemia affecting pregnancy in third trimester    Asthma    Migraines    Obesity    BMI>40   Rapid palpitations 03/01/2021   Tachycardia, unspecified    Past Surgical History:  Procedure Laterality Date   WISDOM TOOTH EXTRACTION     Patient Active Problem List   Diagnosis Date Noted   Postpartum hypertension 09/08/2021   Vaginal delivery 08/05/2021   Symptomatic anemia 07/05/2021   Eczema 06/01/2019   Anxiety and depression 09/04/2017   Maternal obesity affecting pregnancy, antepartum 02/26/2017   Maternal morbid obesity, antepartum ([place]) 02/26/2017   Migraines 10/07/2016   Asthma 10/07/2016    REFERRING DIAG:  M54.6 (ICD-10-CM) - Acute midline thoracic back pain M54.2 (ICD-10-CM) - Neck pain  THERAPY DIAG:  Cervicalgia  Pain in thoracic spine  Muscle weakness (generalized)  Rationale for Evaluation and Treatment Rehabilitation  PERTINENT HISTORY: Scoliosis, Asthma, migraines  PRECAUTIONS: none  SUBJECTIVE:                                                                                                                                                                                      SUBJECTIVE STATEMENT:  "My back is really bothering me today and I have a HA. I had to work for over 14 hours yesterday. I had trouble sleeping last  night as well."   PAIN:  Are you having pain? 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     PLOF: Independent   PATIENT GOALS: stop the  pain   OBJECTIVE: (objective measures completed at initial evaluation unless otherwise dated)   DIAGNOSTIC FINDINGS:  Xray of cspine and tspine showing only scoliosis and curvature flattening.    PATIENT SURVEYS:  NDI 17/50   COGNITION: Overall cognitive status: Within functional limits for tasks assessed   SENSATION: Numbness and tingling in the morning taking a couple hours to stop   POSTURE: forward shoulders, thoracic kyphosis, increased lumbar lordosis    PALPATION: +1 ttp bil UT Rt>Lt, bil cervical, thoracic paraspinals from C5 to T12 with tightness evident in the upper back and shoulder area              CERVICAL ROM:    Active ROM A/PROM (deg) eval  Flexion 37 - feels good  Extension 40  Right lateral flexion 32  Left lateral flexion 40  Right rotation 45  Left rotation 40   (Blank rows = not tested)   LUMBAR AROM: flexion: flat back and neck Extension: pn - hinge around T12  Side bend bil: WNL Rot: pn limited around 25% bil    THORACIC:  Seated flexion: WNL Ext: limited 50% Rotation and side bend: just tight feeling    UPPER EXTREMITY ROM: WNL: feels good stretch with overhead flexion and behind the head positions    TODAY'S TREATMENT:     DATE:  07/14/22: Therapeutic Exercises NuStep L5 x 7' PTA present to discuss status Seated edge of mat for bil HS stretch, then figure 4 piriformis stretch 3x, 20 sec holds returning therapist demo  Seated  neck stretches with self overpressure: SB and levator trap 2x20" bil FreeMotion 7# for bil UE ext, then scapular retraction 2 x 10 each with brief rest between sets Core Stabs in supine: Post pelvic tilt x10, 5 sec holds; then tilt for following: clam (knees open/close) with green theraband held by PTA x 10, alt march 2 x 5 each leg, then alt LAQ (unable to hold tilt with heel slide) all with VC's to encourage pt to hold tilt throughout Supine scapular series with green theraband x10 each returning therapist demo for each Manual Therapy STM in prone to bil thoracic to cervical paraspinals with cocoa butter at end of sessio  07/09/22: Therapeutic Exercises Arm Bike : level 1x 2 minutes (2/2)- PT present to discuss progress  Standing shoulder tband: bilateral shoulder ext, rows, ER, horizontal abduction with red band x 20 each Manual Therapy Trigger Point Dry-Needling  Treatment instructions: Expect mild to moderate muscle soreness. S/S of pneumothorax if dry needled over a lung field, and to seek immediate medical attention should they occur. Patient verbalized understanding of these instructions and education. Patient Consent Given: Yes Education handout provided: Yes Muscles treated: bil cervical and thoracic multifidi, bil upper traps,  Treatment response/outcome: Skilled palpation used to identify taut bands and trigger points.  Once identified, dry needling techniques used to treat these areas.  Twitch response ellicited along with palpable elongation of muscle.  Following treatment, patient came to sit and was able to do full c spine ROM without pain.  Mild soreness reported.   DATE:  07/02/22: Therapeutic Exercises Arm Bike : level 1x 6 minutes (3/3)- PT present to discuss progress  Cervical A/ROM 3 ways 3x20 seconds  L stretch at counter 3x20 seconds  Manual Therapy Trigger Point Dry-Needling  Treatment instructions: Expect mild to moderate muscle soreness. S/S of pneumothorax if dry  needled over a lung field, and to seek immediate medical attention should they occur. Patient verbalized understanding  of these instructions and education.  Patient Consent Given: Yes Education handout provided: Yes Muscles treated: bil cervical and thoracic multifidi, bil upper traps, bil rhomboids  Treatment response/outcome: Utilized skilled palpation to identify trigger points.  During dry needling able to palpate muscle twitch and muscle elongation  Elongation and release after needling  Skilled palpation and monitoring by PT during dry needling                                                                                                                         DATE:  06/30/22: Therapeutic Exercises NuStep L5 x 7' PTA present to discuss status Seated edge of mat for bil HS stretch 3x, 20 sec holds returning therapist demo  Seated neck stretches with self overpressure: SB and levator trap 2x20" bil Core Stabs in supine: Post pelvic tilt x10, 5 sec holds returning therapist demo; then tilt for following: clam (knees open/close) 2 x 5, alt march 2 x 5, then alt LAQ (unable to hold tilt with heel slide) all with VC's to encourage pt to hold tilt throughout Supine for figure 4 piriformis stretch with yoga strap 2x, 20 sec holds FreeMotion Machine 10# for bil scapular retraction, and then bil UE ext 7# with core engaged 2 x 10 Manual Therapy Seated STM/TP release bil upper traps and Rt levator     PATIENT EDUCATION:  Education details: Access Code: [email or web address] Person educated: Patient Education method: Consulting civil engineer, Demonstration, and Handouts Education comprehension: verbalized understanding and needs further education   HOME EXERCISE PROGRAM: Access Code: [email or web address] URL: [email or web address] Date: 06/25/2022 Prepared by: Venetia Night Beuhring  Exercises - Correct Seated Posture  - 1 x daily - 7 x weekly - 1-3 sets - 10 reps - 20-30 seconds hold - Seated Scapular Retraction  -  1 x daily - 7 x weekly - 1-3 sets - 10 reps -   hold - Seated Cervical Sidebending Stretch  - 1 x daily - 7 x weekly - 1-3 sets - 3 reps - 20-30 seconds hold - Seated Levator Scapulae Stretch  - 1 x daily - 7 x weekly - 1-3 sets - 3 reps - 20-30 seconds hold - Seated Mid Back Stretch  - 1 x daily - 7 x weekly - 1-3 sets - 3 reps - 10 sec hold - Standing 'L' Stretch at Counter  - 1 x daily - 7 x weekly - 1-3 sets - 3 reps - 10 sec hold - Seated Flexion Stretch  - 1 x daily - 7 x weekly - 1 sets - 3 reps - 10 sec hold - Supine Lower Trunk Rotation  - 1 x daily - 7 x weekly - 1 sets - 3 reps - 10 sec hold - Sidelying Thoracic Rotation with Open Book  - 1 x daily - 7 x weekly - 1 sets - 10 reps - Standing Row with Anchored Resistance  - 1 x daily - 7 x weekly - 1 sets - 10 reps -  Standing Shoulder Extension with Resistance  - 1 x daily - 7 x weekly - 1 sets - 10 reps - Supine Cervical Retraction with Towel  - 1 x daily - 7 x weekly - 1 sets - 10 reps - 5 sec hold   ASSESSMENT:   CLINICAL IMPRESSION: Pt reports increased mid back pain today due to long working hours yesterday. Continued with bil LE flexibility and core and postural strength. Ended session with manual therapy working to decrease muscular tightness and reduce pain.      OBJECTIVE IMPAIRMENTS: decreased activity tolerance, decreased ROM, decreased strength, impaired flexibility, and pain.    ACTIVITY LIMITATIONS: carrying, lifting, bending, and sleeping   PARTICIPATION LIMITATIONS: none   PERSONAL FACTORS: Fitness are also affecting patient's functional outcome.    REHAB POTENTIAL: Good   CLINICAL DECISION MAKING: Stable/uncomplicated   EVALUATION COMPLEXITY: Low     GOALS: Goals reviewed with patient? Yes   SHORT TERM GOALS=LTGs: Target date: 07/24/22   Pt will decrease pain after work activities to 5/10 or less to demonstrate improved pain.  Baseline: 10/10 Goal status: INITIAL   2.  Pt will be ind with self  stretches and strengthening for continued postural improvements Baseline: changing position at work and home (07/02/22) Goal status: MET   3.  Pt will demonstrate proper work station and lifting techniques to prevent pain Baseline:  Goal status: INITIAL     PLAN:   PT FREQUENCY: 1-2x/week (pt is able to do 4pm and days off only)   PT DURATION: 4 weeks   PLANNED INTERVENTIONS: Therapeutic exercises, Therapeutic activity, Neuromuscular re-education, Patient/Family education, Self Care, Joint mobilization, Dry Needling, Electrical stimulation, Moist heat, Taping, Traction, Manual therapy, and Re-evaluation   PLAN FOR NEXT SESSION: Progress HEP, STM/MFR especially midline spine and UT, mod PRN, postural TE and lifting strength; add core stabs to HEP   Collie Siad, PTA 07/14/22 11:52 AM

## 2022-07-16 ENCOUNTER — Ambulatory Visit: Payer: BC Managed Care – PPO

## 2022-07-16 DIAGNOSIS — R252 Cramp and spasm: Secondary | ICD-10-CM | POA: Diagnosis not present

## 2022-07-16 DIAGNOSIS — M546 Pain in thoracic spine: Secondary | ICD-10-CM | POA: Diagnosis not present

## 2022-07-16 DIAGNOSIS — M542 Cervicalgia: Secondary | ICD-10-CM

## 2022-07-16 DIAGNOSIS — M6281 Muscle weakness (generalized): Secondary | ICD-10-CM

## 2022-07-16 DIAGNOSIS — R293 Abnormal posture: Secondary | ICD-10-CM | POA: Diagnosis not present

## 2022-07-16 NOTE — Therapy (Signed)
OUTPATIENT PHYSICAL THERAPY TREATMENT NOTE   Patient Name: Shelby Obrien MRN: [id number] DOB:06/26/1990, 32 y.o., female Today's Date: 07/16/2022  PCP: none REFERRING PROVIDER: Dr. Alysia Penna, MD  END OF SESSION:   PT End of Session - 07/16/22 1620     Visit Number 7    Date for PT Re-Evaluation 07/24/22    Authorization Type no auth needed    PT Start Time 1620    PT Stop Time 1654    PT Time Calculation (min) 34 min    Activity Tolerance Patient tolerated treatment well    Behavior During Therapy [facility] for tasks assessed/performed              Past Medical History:  Diagnosis Date   Anemia affecting pregnancy in third trimester    Asthma    Migraines    Obesity    BMI>40   Rapid palpitations 03/01/2021   Tachycardia, unspecified    Past Surgical History:  Procedure Laterality Date   WISDOM TOOTH EXTRACTION     Patient Active Problem List   Diagnosis Date Noted   Postpartum hypertension 09/08/2021   Vaginal delivery 08/05/2021   Symptomatic anemia 07/05/2021   Eczema 06/01/2019   Anxiety and depression 09/04/2017   Maternal obesity affecting pregnancy, antepartum 02/26/2017   Maternal morbid obesity, antepartum ([place]) 02/26/2017   Migraines 10/07/2016   Asthma 10/07/2016    REFERRING DIAG:  M54.6 (ICD-10-CM) - Acute midline thoracic back pain M54.2 (ICD-10-CM) - Neck pain  THERAPY DIAG:  Cervicalgia  Pain in thoracic spine  Muscle weakness (generalized)  Cramp and spasm  Abnormal posture  Rationale for Evaluation and Treatment Rehabilitation  PERTINENT HISTORY: Scoliosis, Asthma, migraines  PRECAUTIONS: none  SUBJECTIVE:                                                                                                                                                                                      SUBJECTIVE STATEMENT:  Patient reports most of her pain in right side just below shoulder blade.  Overall doing well and responding  well to current treatment regimen.     PAIN:  Are you having pain? 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     PLOF: Independent   PATIENT GOALS: stop the pain  OBJECTIVE: (objective measures completed at initial evaluation unless otherwise dated)   DIAGNOSTIC FINDINGS:  Xray of cspine and tspine showing only scoliosis and curvature flattening.    PATIENT SURVEYS:  NDI 17/50   COGNITION: Overall cognitive status: Within functional limits for tasks assessed   SENSATION: Numbness and tingling in the morning taking a couple hours to stop   POSTURE: forward shoulders, thoracic kyphosis, increased lumbar lordosis    PALPATION: +1 ttp bil UT Rt>Lt, bil cervical, thoracic paraspinals from C5 to T12 with tightness evident in the upper back and shoulder area              CERVICAL ROM:    Active ROM A/PROM (deg) eval  Flexion 37 - feels good  Extension 40  Right lateral flexion 32  Left lateral flexion 40  Right rotation 45  Left rotation 40   (Blank rows = not tested)   LUMBAR AROM: flexion: flat back and neck Extension: pn - hinge around T12  Side bend bil: WNL Rot: pn limited around 25% bil    THORACIC:  Seated flexion: WNL Ext: limited 50% Rotation and side bend: just tight feeling    UPPER EXTREMITY ROM: WNL: feels good stretch with overhead flexion and behind the head positions    TODAY'S TREATMENT:     DATE:  07/16/22: Therapeutic Exercises Arm Bike : level 1x 2 minutes (2/2)- PT present to discuss progress  Standing in doorway: left foot front, staggered stance with right arm reach  over for door frame to stretch right side x 5 holding 10 sec each Doorway stretch for pecs x 5 hold 10 sec Standing shoulder tband: bilateral shoulder ext, rows, ER, horizontal abduction with green band x 20 each Manual Therapy Trigger Point Dry-Needling  Treatment instructions: Expect mild to moderate muscle soreness. S/S of pneumothorax if dry needled over a lung field, and to seek immediate medical attention should they occur. Patient verbalized understanding of these instructions and education. Patient Consent Given: Yes Education handout provided: Yes Muscles treated: bil cervical multifidi, bil upper traps, splenius capitus and cervicis, sub occipitals Treatment response/outcome: Skilled palpation used to identify taut bands and trigger points.  Once identified, dry needling techniques used to treat these areas.  Twitch response ellicited along with palpable elongation of muscle.  Following treatment, patient came to sit and was able to do full cervical  ROM without pain.  Mild soreness reported.  Manual STM and MFT to midline along thoracic spine and parascapular areas.  07/14/22: Therapeutic Exercises NuStep L5 x 7' PTA present to discuss status Seated edge of mat for bil HS stretch, then figure 4 piriformis stretch 3x, 20 sec holds returning therapist demo  Seated neck stretches with self overpressure: SB and levator trap 2x20" bil FreeMotion 7# for bil UE ext, then scapular retraction 2 x 10 each with brief rest between sets Core Stabs in supine: Post pelvic tilt x10, 5 sec holds; then tilt for following: clam (knees open/close) with green theraband held by PTA x 10, alt march 2 x 5 each leg, then alt LAQ (unable to hold tilt with heel slide) all with VC's to encourage pt to hold tilt throughout Supine scapular series with green theraband x10 each returning therapist demo for each Manual Therapy STM in prone to bil thoracic to cervical paraspinals with cocoa butter at end of  sessio  07/09/22: Therapeutic Exercises Arm Bike : level 1x 2 minutes (2/2)- PT present to discuss progress  Standing shoulder tband: bilateral shoulder  ext, rows, ER, horizontal abduction with red band x 20 each Manual Therapy Trigger Point Dry-Needling  Treatment instructions: Expect mild to moderate muscle soreness. S/S of pneumothorax if dry needled over a lung field, and to seek immediate medical attention should they occur. Patient verbalized understanding of these instructions and education. Patient Consent Given: Yes Education handout provided: Yes Muscles treated: bil cervical and thoracic multifidi, bil upper traps,  Treatment response/outcome: Skilled palpation used to identify taut bands and trigger points.  Once identified, dry needling techniques used to treat these areas.  Twitch response ellicited along with palpable elongation of muscle.  Following treatment, patient came to sit and was able to do full c spine ROM without pain.  Mild soreness reported.     PATIENT EDUCATION:  Education details: Access Code: [id number] Person educated: Patient Education method: Explanation, Demonstration, and Handouts Education comprehension: verbalized understanding and needs further education   HOME EXERCISE PROGRAM: Access Code: [email or web address] URL: [email or web address] Date: 06/25/2022 Prepared by: Venetia Night Beuhring  Exercises - Correct Seated Posture  - 1 x daily - 7 x weekly - 1-3 sets - 10 reps - 20-30 seconds hold - Seated Scapular Retraction  - 1 x daily - 7 x weekly - 1-3 sets - 10 reps -   hold - Seated Cervical Sidebending Stretch  - 1 x daily - 7 x weekly - 1-3 sets - 3 reps - 20-30 seconds hold - Seated Levator Scapulae Stretch  - 1 x daily - 7 x weekly - 1-3 sets - 3 reps - 20-30 seconds hold - Seated Mid Back Stretch  - 1 x daily - 7 x weekly - 1-3 sets - 3 reps - 10 sec hold - Standing 'L' Stretch at Counter  - 1 x daily - 7 x weekly - 1-3 sets - 3 reps - 10 sec  hold - Seated Flexion Stretch  - 1 x daily - 7 x weekly - 1 sets - 3 reps - 10 sec hold - Supine Lower Trunk Rotation  - 1 x daily - 7 x weekly - 1 sets - 3 reps - 10 sec hold - Sidelying Thoracic Rotation with Open Book  - 1 x daily - 7 x weekly - 1 sets - 10 reps - Standing Row with Anchored Resistance  - 1 x daily - 7 x weekly - 1 sets - 10 reps - Standing Shoulder Extension with Resistance  - 1 x daily - 7 x weekly - 1 sets - 10 reps - Supine Cervical Retraction with Towel  - 1 x daily - 7 x weekly - 1 sets - 10 reps - 5 sec hold   ASSESSMENT:   CLINICAL IMPRESSION: Rayn is progressing appropriately.  Her work and work schedule likely contribute to her muscle tension at times.  She was able to tolerate increased resistance with postural exercises.  She had good response to new stretches and felt she could possibly do these at work if she is having pain.   She would benefit from continued skilled PT for postural strengthening, bilateral shoulder strengthening, DN and manual techniques to control pain and spasm.     OBJECTIVE IMPAIRMENTS: decreased activity tolerance, decreased ROM, decreased strength, impaired flexibility, and pain.    ACTIVITY LIMITATIONS: carrying, lifting, bending, and sleeping   PARTICIPATION LIMITATIONS: none   PERSONAL FACTORS: Fitness are also affecting patient's functional outcome.    REHAB POTENTIAL: Good   CLINICAL DECISION MAKING: Stable/uncomplicated   EVALUATION COMPLEXITY: Low     GOALS:  Goals reviewed with patient? Yes   SHORT TERM GOALS=LTGs: Target date: 07/24/22   Pt will decrease pain after work activities to 5/10 or less to demonstrate improved pain.  Baseline: 10/10 Goal status: INITIAL   2.  Pt will be ind with self stretches and strengthening for continued postural improvements Baseline: changing position at work and home (07/02/22) Goal status: MET   3.  Pt will demonstrate proper work station and lifting techniques to prevent  pain Baseline:  Goal status: INITIAL     PLAN:   PT FREQUENCY: 1-2x/week (pt is able to do 4pm and days off only)   PT DURATION: 4 weeks   PLANNED INTERVENTIONS: Therapeutic exercises, Therapeutic activity, Neuromuscular re-education, Patient/Family education, Self Care, Joint mobilization, Dry Needling, Electrical stimulation, Moist heat, Taping, Traction, Manual therapy, and Re-evaluation   PLAN FOR NEXT SESSION: Progress HEP, DN, STM/MFR especially midline spine and UT, mod PRN, postural TE and lifting strength; add core stabs to [employer] B. [place], PT 07/16/22 5:02 PM [place] [address] [place], [place] [address] Phone # [phone] Fax [phone]

## 2022-07-22 ENCOUNTER — Ambulatory Visit: Payer: BC Managed Care – PPO

## 2022-07-22 DIAGNOSIS — M542 Cervicalgia: Secondary | ICD-10-CM | POA: Diagnosis not present

## 2022-07-22 DIAGNOSIS — R293 Abnormal posture: Secondary | ICD-10-CM

## 2022-07-22 DIAGNOSIS — R252 Cramp and spasm: Secondary | ICD-10-CM | POA: Diagnosis not present

## 2022-07-22 DIAGNOSIS — M546 Pain in thoracic spine: Secondary | ICD-10-CM

## 2022-07-22 DIAGNOSIS — M6281 Muscle weakness (generalized): Secondary | ICD-10-CM

## 2022-07-22 NOTE — Therapy (Signed)
OUTPATIENT PHYSICAL THERAPY TREATMENT NOTE   Patient Name: Shelby Obrien MRN: [id number] DOB:1990/08/09, 32 y.o., female Today's Date: 07/22/2022  PCP: none REFERRING PROVIDER: Dr. Alysia Penna, MD  END OF SESSION:   PT End of Session - 07/22/22 1111     Visit Number 8    Number of Visits 9    Date for PT Re-Evaluation 07/24/22    PT Start Time 1109    PT Stop Time [id number]    PT Time Calculation (min) 46 min    Activity Tolerance Patient tolerated treatment well    Behavior During Therapy [facility] for tasks assessed/performed              Past Medical History:  Diagnosis Date   Anemia affecting pregnancy in third trimester    Asthma    Migraines    Obesity    BMI>40   Rapid palpitations 03/01/2021   Tachycardia, unspecified    Past Surgical History:  Procedure Laterality Date   WISDOM TOOTH EXTRACTION     Patient Active Problem List   Diagnosis Date Noted   Postpartum hypertension 09/08/2021   Vaginal delivery 08/05/2021   Symptomatic anemia 07/05/2021   Eczema 06/01/2019   Anxiety and depression 09/04/2017   Maternal obesity affecting pregnancy, antepartum 02/26/2017   Maternal morbid obesity, antepartum ([place]) 02/26/2017   Migraines 10/07/2016   Asthma 10/07/2016    REFERRING DIAG:  M54.6 (ICD-10-CM) - Acute midline thoracic back pain M54.2 (ICD-10-CM) - Neck pain  THERAPY DIAG:  Cervicalgia  Pain in thoracic spine  Muscle weakness (generalized)  Cramp and spasm  Abnormal posture  Rationale for Evaluation and Treatment Rehabilitation  PERTINENT HISTORY: Scoliosis, Asthma, migraines  PRECAUTIONS: none  SUBJECTIVE:                                                                                                                                                                                      SUBJECTIVE STATEMENT:  My back isn't as bad today as it has been. I definitely feel like we are making progress with physical therapy.   PAIN:   Are you having pain? 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     PLOF: Independent   PATIENT GOALS: stop the pain   OBJECTIVE: (objective measures completed at initial  evaluation unless otherwise dated)   DIAGNOSTIC FINDINGS:  Xray of cspine and tspine showing only scoliosis and curvature flattening.    PATIENT SURVEYS:  NDI 17/50   COGNITION: Overall cognitive status: Within functional limits for tasks assessed   SENSATION: Numbness and tingling in the morning taking a couple hours to stop   POSTURE: forward shoulders, thoracic kyphosis, increased lumbar lordosis    PALPATION: +1 ttp bil UT Rt>Lt, bil cervical, thoracic paraspinals from C5 to T12 with tightness evident in the upper back and shoulder area              CERVICAL ROM:    Active ROM A/PROM (deg) eval  Flexion 37 - feels good  Extension 40  Right lateral flexion 32  Left lateral flexion 40  Right rotation 45  Left rotation 40   (Blank rows = not tested)   LUMBAR AROM: flexion: flat back and neck Extension: pn - hinge around T12  Side bend bil: WNL Rot: pn limited around 25% bil    THORACIC:  Seated flexion: WNL Ext: limited 50% Rotation and side bend: just tight feeling    UPPER EXTREMITY ROM: WNL: feels good stretch with overhead flexion and behind the head positions    TODAY'S TREATMENT:     DATE:  07/22/22: Therapeutic Exercises NuStep L5 x 7' PTA present to discuss status (seat 9/UE 9) Seated edge of mat for bil HS stretch, then figure 4 piriformis stretch 3x, 20 sec holds   FreeMotion 7# for bil UE ext, then scapular  retraction 2 x 10 each with brief rest between sets Supine bridges with ball squeeze x10, 5 sec holds, then bridges with feet on Airex and therapist stabilizing Airex from sliding on mat Core Stabs in supine: Post pelvic tilt for following: clam (knees open/close) x 10, alt march 2 x 5 each leg, then alt LAQ (unable to hold tilt with heel slide) all with VC's to encourage pt to hold tilt throughout Supine scapular series with green theraband x10 each horz abd, bil er, and bil D2; added to HEP Manual Therapy STM seated edge of bed to bil thoracic paraspinals with cocoa butter at end of session  07/16/22: Therapeutic Exercises Arm Bike : level 1x 2 minutes (2/2)- PT present to discuss progress  Standing in doorway: left foot front, staggered stance with right arm reach over for door frame to stretch right side x 5 holding 10 sec each Doorway stretch for pecs x 5 hold 10 sec Standing shoulder tband: bilateral shoulder ext, rows, ER, horizontal abduction with green band x 20 each Manual Therapy Trigger Point Dry-Needling  Treatment instructions: Expect mild to moderate muscle soreness. S/S of pneumothorax if dry needled over a lung field, and to seek immediate medical attention should they occur. Patient verbalized understanding of these instructions and education. Patient Consent Given: Yes Education handout provided: Yes Muscles treated: bil cervical multifidi, bil upper traps, splenius capitus and cervicis, sub occipitals Treatment response/outcome: Skilled palpation used to identify taut bands and trigger points.  Once identified, dry needling techniques used to treat these areas.  Twitch response ellicited along with palpable elongation of muscle.  Following treatment, patient came to sit and was able to do full cervical  ROM without pain.  Mild soreness reported.  Manual STM and MFT to midline along thoracic spine and parascapular areas.  07/14/22: Therapeutic Exercises NuStep L5 x 7' PTA  present to discuss status Seated edge of mat for bil HS stretch, then figure 4  piriformis stretch 3x, 20 sec holds returning therapist demo  Seated neck stretches with self overpressure: SB and levator trap 2x20" bil FreeMotion 7# for bil UE ext, then scapular retraction 2 x 10 each with brief rest between sets Core Stabs in supine: Post pelvic tilt x10, 5 sec holds; then tilt for following: clam (knees open/close) with green theraband held by PTA x 10, alt march 2 x 5 each leg, then alt LAQ (unable to hold tilt with heel slide) all with VC's to encourage pt to hold tilt throughout Supine scapular series with green theraband x10 each returning therapist demo for each Manual Therapy STM in prone to bil thoracic to cervical paraspinals with cocoa butter at end of session    PATIENT EDUCATION:  Education details: Access Code: [email or web address] Person educated: Patient Education method: Explanation, Demonstration, and Handouts Education comprehension: verbalized understanding and needs further education   HOME EXERCISE PROGRAM: Access Code: [email or web address] URL: [email or web address] Date: 06/25/2022 Prepared by: Venetia Night Beuhring  Exercises - Correct Seated Posture  - 1 x daily - 7 x weekly - 1-3 sets - 10 reps - 20-30 seconds hold - Seated Scapular Retraction  - 1 x daily - 7 x weekly - 1-3 sets - 10 reps -   hold - Seated Cervical Sidebending Stretch  - 1 x daily - 7 x weekly - 1-3 sets - 3 reps - 20-30 seconds hold - Seated Levator Scapulae Stretch  - 1 x daily - 7 x weekly - 1-3 sets - 3 reps - 20-30 seconds hold - Seated Mid Back Stretch  - 1 x daily - 7 x weekly - 1-3 sets - 3 reps - 10 sec hold - Standing 'L' Stretch at Counter  - 1 x daily - 7 x weekly - 1-3 sets - 3 reps - 10 sec hold - Seated Flexion Stretch  - 1 x daily - 7 x weekly - 1 sets - 3 reps - 10 sec hold - Supine Lower Trunk Rotation  - 1 x daily - 7 x weekly - 1 sets - 3 reps - 10 sec hold - Sidelying Thoracic Rotation  with Open Book  - 1 x daily - 7 x weekly - 1 sets - 10 reps - Standing Row with Anchored Resistance  - 1 x daily - 7 x weekly - 1 sets - 10 reps - Standing Shoulder Extension with Resistance  - 1 x daily - 7 x weekly - 1 sets - 10 reps - Supine Cervical Retraction with Towel  - 1 x daily - 7 x weekly - 1 sets - 10 reps - 5 sec hold  Added to [place] 07/22/22: - Supine Shoulder Horizontal Abduction with Resistance  - 2 x daily - 4-5 x weekly - 1-2 sets - 10 reps - 3 sec hold - Supine Shoulder External Rotation with Resistance  - 2 x daily - 4-5 x weekly - 1-2 sets - 10 reps - 3 sec hold - Supine narrow and wide grip flexion  - 4 x daily - 4-5 x weekly - 1-2 sets - 10 reps - 3 sec hold - Supine PNF D2   - 1 x daily - 4-5 x weekly - 1-2 sets - 10 reps - 3 sec hold   ASSESSMENT:   CLINICAL IMPRESSION: Stephney comes in reporting having less pain today. Continued with bil LE flexibility, and postural strength along with core stabs as she reports that overall she is noticing improvements as her pain has improved since start  of care. She also reports has been incorporating stretches into her work day which seems to lessen her after work pain.    OBJECTIVE IMPAIRMENTS: decreased activity tolerance, decreased ROM, decreased strength, impaired flexibility, and pain.    ACTIVITY LIMITATIONS: carrying, lifting, bending, and sleeping   PARTICIPATION LIMITATIONS: none   PERSONAL FACTORS: Fitness are also affecting patient's functional outcome.    REHAB POTENTIAL: Good   CLINICAL DECISION MAKING: Stable/uncomplicated   EVALUATION COMPLEXITY: Low     GOALS: Goals reviewed with patient? Yes   SHORT TERM GOALS=LTGs: Target date: 07/24/22   Pt will decrease pain after work activities to 5/10 or less to demonstrate improved pain.  Baseline: 10/10 Goal status: INITIAL   2.  Pt will be ind with self stretches and strengthening for continued postural improvements Baseline: changing position at work  and home (07/02/22) Goal status: MET   3.  Pt will demonstrate proper work station and lifting techniques to prevent pain Baseline:  Goal status: INITIAL     PLAN:   PT FREQUENCY: 1-2x/week (pt is able to do 4pm and days off only)   PT DURATION: 4 weeks   PLANNED INTERVENTIONS: Therapeutic exercises, Therapeutic activity, Neuromuscular re-education, Patient/Family education, Self Care, Joint mobilization, Dry Needling, Electrical stimulation, Moist heat, Taping, Traction, Manual therapy, and Re-evaluation   PLAN FOR NEXT SESSION: Progress HEP, DN, STM/MFR especially midline spine and UT, mod PRN, postural TE and lifting strength; add core stabs to HEP   Collie Siad, PTA 07/22/22 12:00 PM

## 2022-07-23 ENCOUNTER — Ambulatory Visit: Payer: BC Managed Care – PPO

## 2022-07-23 DIAGNOSIS — M542 Cervicalgia: Secondary | ICD-10-CM

## 2022-07-23 DIAGNOSIS — R252 Cramp and spasm: Secondary | ICD-10-CM | POA: Diagnosis not present

## 2022-07-23 DIAGNOSIS — R293 Abnormal posture: Secondary | ICD-10-CM

## 2022-07-23 DIAGNOSIS — M546 Pain in thoracic spine: Secondary | ICD-10-CM

## 2022-07-23 DIAGNOSIS — M6281 Muscle weakness (generalized): Secondary | ICD-10-CM | POA: Diagnosis not present

## 2022-07-23 NOTE — Therapy (Signed)
OUTPATIENT PHYSICAL THERAPY TREATMENT NOTE   Patient Name: Shelby Obrien MRN: [id number] DOB:[birth year], 32 y.o., female Today's Date: 07/23/2022  PCP: none REFERRING PROVIDER: Dr. Alysia Penna, MD  END OF SESSION:   PT End of Session - 07/23/22 1110     Visit Number 9    Number of Visits 9    Date for PT Re-Evaluation 07/24/22    PT Start Time 1108    PT Stop Time [id number]    PT Time Calculation (min) 45 min    Activity Tolerance Patient tolerated treatment well    Behavior During Therapy [facility] for tasks assessed/performed              Past Medical History:  Diagnosis Date   Anemia affecting pregnancy in third trimester    Asthma    Migraines    Obesity    BMI>40   Rapid palpitations 03/01/2021   Tachycardia, unspecified    Past Surgical History:  Procedure Laterality Date   WISDOM TOOTH EXTRACTION     Patient Active Problem List   Diagnosis Date Noted   Postpartum hypertension 09/08/2021   Vaginal delivery 08/05/2021   Symptomatic anemia 07/05/2021   Eczema 06/01/2019   Anxiety and depression 09/04/2017   Maternal obesity affecting pregnancy, antepartum 02/26/2017   Maternal morbid obesity, antepartum ([place]) 02/26/2017   Migraines 10/07/2016   Asthma 10/07/2016    REFERRING DIAG:  M54.6 (ICD-10-CM) - Acute midline thoracic back pain M54.2 (ICD-10-CM) - Neck pain  THERAPY DIAG:  Cervicalgia  Pain in thoracic spine  Muscle weakness (generalized)  Cramp and spasm  Abnormal posture  Rationale for Evaluation and Treatment Rehabilitation  PERTINENT HISTORY: Scoliosis, Asthma, migraines  PRECAUTIONS: none  SUBJECTIVE:                                                                                                                                                                                      SUBJECTIVE STATEMENT:  My pain is worse today, probably a combination from the rain and didn't sleep much last night because my 32 yr old kept waking  up.   PAIN:  Are you having pain? 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     PLOF: Independent   PATIENT GOALS: stop the pain   OBJECTIVE: (objective measures completed at initial evaluation unless otherwise  dated)   DIAGNOSTIC FINDINGS:  Xray of cspine and tspine showing only scoliosis and curvature flattening.    PATIENT SURVEYS:  NDI 17/50   COGNITION: Overall cognitive status: Within functional limits for tasks assessed   SENSATION: Numbness and tingling in the morning taking a couple hours to stop   POSTURE: forward shoulders, thoracic kyphosis, increased lumbar lordosis    PALPATION: +1 ttp bil UT Rt>Lt, bil cervical, thoracic paraspinals from C5 to T12 with tightness evident in the upper back and shoulder area              CERVICAL ROM:    Active ROM A/PROM (deg) eval  Flexion 37 - feels good  Extension 40  Right lateral flexion 32  Left lateral flexion 40  Right rotation 45  Left rotation 40   (Blank rows = not tested)   LUMBAR AROM: flexion: flat back and neck Extension: pn - hinge around T12  Side bend bil: WNL Rot: pn limited around 25% bil    THORACIC:  Seated flexion: WNL Ext: limited 50% Rotation and side bend: just tight feeling    UPPER EXTREMITY ROM: WNL: feels good stretch with overhead flexion and behind the head positions    TODAY'S TREATMENT:     DATE:  07/23/22: Therapeutic Exercises NuStep L5 x 8' PTA present to discuss status (seat 9/UE 9) Seated edge of mat for bil HS stretch, then figure 4 piriformis stretch 3x, 20 sec holds   FreeMotion standing on flat side of bosu 7# for bil UE scap  retract, 2 x 10, biul IE ext 3# (7# too challenging on bosu) each with brief rest between sets Quadruped for opposite Rt UE/Lt LE x5 , then same on other side returning therapist demo; then into childs pose 3x, and then 1x to each side with 5 sec holds, then cat/camel stretch 3x each; tried prone press up but increased back pain so stopped after 1 rep Doorway pectoralis stretch 2x, 20 sec holds; followed by lat stretch in doorway Core Stabs in supine: Post pelvic tilt x 10 reps, 5 sec holds then tilt for following: clam (knees open/close) with green theraband held by pt x 10, alt march 2 x 10 each leg with 2# each ankle, then alt heel slide as pt was able to perform this today with control of tilt 2 x 10 each Manual Therapy STM in prone with cocoa butter to bil thoracic paraspinals at end of session  07/22/22: Therapeutic Exercises NuStep L5 x 8' PTA present to discuss status (seat 9/UE 9) Seated edge of mat for bil HS stretch, then figure 4 piriformis stretch 3x, 20 sec holds   FreeMotion 7# for bil UE ext, then scapular retraction 2 x 10 each with brief rest between sets Supine bridges with ball squeeze x10, 5 sec holds, then bridges with feet on Airex and therapist stabilizing Airex from sliding on mat Core Stabs in supine: Post pelvic tilt for following: clam (knees open/close), alt march 2 x 5 each leg, then alt LAQ (unable to hold tilt with heel slide) all with VC's to encourage pt to hold tilt throughout Supine scapular series with green theraband x10 each horz abd, bil er, and bil D2; added to HEP Manual Therapy STM seated edge of bed to bil thoracic paraspinals with cocoa butter at end of session  07/16/22: Therapeutic Exercises Arm Bike : level 1x 2 minutes (2/2)- PT present to discuss progress  Standing in doorway: left foot front, staggered stance with  right arm reach over for door frame to stretch right side x 5 holding 10 sec each Doorway stretch for pecs x 5 hold 10 sec Standing  shoulder tband: bilateral shoulder ext, rows, ER, horizontal abduction with green band x 20 each Manual Therapy Trigger Point Dry-Needling  Treatment instructions: Expect mild to moderate muscle soreness. S/S of pneumothorax if dry needled over a lung field, and to seek immediate medical attention should they occur. Patient verbalized understanding of these instructions and education. Patient Consent Given: Yes Education handout provided: Yes Muscles treated: bil cervical multifidi, bil upper traps, splenius capitus and cervicis, sub occipitals Treatment response/outcome: Skilled palpation used to identify taut bands and trigger points.  Once identified, dry needling techniques used to treat these areas.  Twitch response ellicited along with palpable elongation of muscle.  Following treatment, patient came to sit and was able to do full cervical  ROM without pain.  Mild soreness reported.  Manual STM and MFT to midline along thoracic spine and parascapular areas.     PATIENT EDUCATION:  Education details: Access Code: [id number] Person educated: Patient Education method: Explanation, Demonstration, and Handouts Education comprehension: verbalized understanding and needs further education   HOME EXERCISE PROGRAM: Access Code: [email or web address] URL: [email or web address] Date: 06/25/2022 Prepared by: Venetia Night Beuhring  Exercises - Correct Seated Posture  - 1 x daily - 7 x weekly - 1-3 sets - 10 reps - 20-30 seconds hold - Seated Scapular Retraction  - 1 x daily - 7 x weekly - 1-3 sets - 10 reps -   hold - Seated Cervical Sidebending Stretch  - 1 x daily - 7 x weekly - 1-3 sets - 3 reps - 20-30 seconds hold - Seated Levator Scapulae Stretch  - 1 x daily - 7 x weekly - 1-3 sets - 3 reps - 20-30 seconds hold - Seated Mid Back Stretch  - 1 x daily - 7 x weekly - 1-3 sets - 3 reps - 10 sec hold - Standing 'L' Stretch at Counter  - 1 x daily - 7 x weekly - 1-3 sets - 3 reps - 10 sec hold -  Seated Flexion Stretch  - 1 x daily - 7 x weekly - 1 sets - 3 reps - 10 sec hold - Supine Lower Trunk Rotation  - 1 x daily - 7 x weekly - 1 sets - 3 reps - 10 sec hold - Sidelying Thoracic Rotation with Open Book  - 1 x daily - 7 x weekly - 1 sets - 10 reps - Standing Row with Anchored Resistance  - 1 x daily - 7 x weekly - 1 sets - 10 reps - Standing Shoulder Extension with Resistance  - 1 x daily - 7 x weekly - 1 sets - 10 reps - Supine Cervical Retraction with Towel  - 1 x daily - 7 x weekly - 1 sets - 10 reps - 5 sec hold  Added to [place] 07/22/22: - Supine Shoulder Horizontal Abduction with Resistance  - 2 x daily - 4-5 x weekly - 1-2 sets - 10 reps - 3 sec hold - Supine Shoulder External Rotation with Resistance  - 2 x daily - 4-5 x weekly - 1-2 sets - 10 reps - 3 sec hold - Supine narrow and wide grip flexion  - 4 x daily - 4-5 x weekly - 1-2 sets - 10 reps - 3 sec hold - Supine PNF D2   - 1 x daily - 4-5 x weekly - 1-2  sets - 10 reps - 3 sec hold   ASSESSMENT:   CLINICAL IMPRESSION: Overall pt continues to report good progress since starting physical therapy. She reports improvement in mobility and though she still has flare ups her pain isn't as constant as it was before where as now she just has "bad days". Progressed pt today with standing on bosu for core strength with postural strength, then added ankle weights to core stabs and pt was able to hold/control tilt with heel slide today all showing progress in strength. She would like to cont physical therapy at this time as she is making progress so renewal will be done next week.    OBJECTIVE IMPAIRMENTS: decreased activity tolerance, decreased ROM, decreased strength, impaired flexibility, and pain.    ACTIVITY LIMITATIONS: carrying, lifting, bending, and sleeping   PARTICIPATION LIMITATIONS: none   PERSONAL FACTORS: Fitness are also affecting patient's functional outcome.    REHAB POTENTIAL: Good   CLINICAL DECISION  MAKING: Stable/uncomplicated   EVALUATION COMPLEXITY: Low     GOALS: Goals reviewed with patient? Yes   SHORT TERM GOALS=LTGs: Target date: 07/24/22   Pt will decrease pain after work activities to 5/10 or less to demonstrate improved pain.  Baseline: 10/10 Goal status: INITIAL   2.  Pt will be ind with self stretches and strengthening for continued postural improvements Baseline: changing position at work and home (07/02/22) Goal status: MET   3.  Pt will demonstrate proper work station and lifting techniques to prevent pain Baseline:  Goal status: INITIAL     PLAN:   PT FREQUENCY: 1-2x/week (pt is able to do 4pm and days off only)   PT DURATION: 4 weeks   PLANNED INTERVENTIONS: Therapeutic exercises, Therapeutic activity, Neuromuscular re-education, Patient/Family education, Self Care, Joint mobilization, Dry Needling, Electrical stimulation, Moist heat, Taping, Traction, Manual therapy, and Re-evaluation   PLAN FOR NEXT SESSION: Renewal next. Progress HEP, DN, STM/MFR especially midline spine and UT, mod PRN, postural TE and lifting strength   Collie Siad, PTA 07/23/22 11:59 AM

## 2022-07-29 ENCOUNTER — Ambulatory Visit: Payer: BC Managed Care – PPO | Attending: Family Medicine

## 2022-07-29 DIAGNOSIS — R293 Abnormal posture: Secondary | ICD-10-CM | POA: Insufficient documentation

## 2022-07-29 DIAGNOSIS — M6281 Muscle weakness (generalized): Secondary | ICD-10-CM | POA: Insufficient documentation

## 2022-07-29 DIAGNOSIS — R252 Cramp and spasm: Secondary | ICD-10-CM | POA: Insufficient documentation

## 2022-07-29 DIAGNOSIS — M542 Cervicalgia: Secondary | ICD-10-CM | POA: Insufficient documentation

## 2022-07-29 DIAGNOSIS — M546 Pain in thoracic spine: Secondary | ICD-10-CM | POA: Insufficient documentation

## 2022-07-30 ENCOUNTER — Ambulatory Visit: Payer: BC Managed Care – PPO

## 2022-07-30 DIAGNOSIS — R252 Cramp and spasm: Secondary | ICD-10-CM

## 2022-07-30 DIAGNOSIS — M6281 Muscle weakness (generalized): Secondary | ICD-10-CM

## 2022-07-30 DIAGNOSIS — R293 Abnormal posture: Secondary | ICD-10-CM

## 2022-07-30 DIAGNOSIS — M542 Cervicalgia: Secondary | ICD-10-CM | POA: Diagnosis not present

## 2022-07-30 DIAGNOSIS — M546 Pain in thoracic spine: Secondary | ICD-10-CM | POA: Diagnosis not present

## 2022-07-30 NOTE — Therapy (Signed)
OUTPATIENT PHYSICAL THERAPY TREATMENT NOTE Progress Note Reporting Period 06/12/22 to 07/30/22  See note below for Objective Data and Assessment of Progress/Goals.       Patient Name: Shelby Obrien MRN: [id number] DOB:Sep 17, 1990, 32 y.o., female Today's Date: 07/30/2022  PCP: none REFERRING PROVIDER: Dr. Alysia Penna, MD  END OF SESSION:   PT End of Session - 07/30/22 1100     Visit Number 10    Date for PT Re-Evaluation 09/24/22    Authorization Type no auth needed    Progress Note Due on Visit 20    PT Start Time 1101    PT Stop Time 1130    PT Time Calculation (min) 29 min    Activity Tolerance Patient tolerated treatment well    Behavior During Therapy [facility] for tasks assessed/performed              Past Medical History:  Diagnosis Date   Anemia affecting pregnancy in third trimester    Asthma    Migraines    Obesity    BMI>40   Rapid palpitations 03/01/2021   Tachycardia, unspecified    Past Surgical History:  Procedure Laterality Date   WISDOM TOOTH EXTRACTION     Patient Active Problem List   Diagnosis Date Noted   Postpartum hypertension 09/08/2021   Vaginal delivery 08/05/2021   Symptomatic anemia 07/05/2021   Eczema 06/01/2019   Anxiety and depression 09/04/2017   Maternal obesity affecting pregnancy, antepartum 02/26/2017   Maternal morbid obesity, antepartum 02/26/2017   Migraines 10/07/2016   Asthma 10/07/2016    REFERRING DIAG:  M54.6 (ICD-10-CM) - Acute midline thoracic back pain M54.2 (ICD-10-CM) - Neck pain  THERAPY DIAG:  Cervicalgia - Plan: PT plan of care cert/re-cert  Pain in thoracic spine - Plan: PT plan of care cert/re-cert  Muscle weakness (generalized) - Plan: PT plan of care cert/re-cert  Cramp and spasm - Plan: PT plan of care cert/re-cert  Abnormal posture - Plan: PT plan of care cert/re-cert  Rationale for Evaluation and Treatment Rehabilitation  PERTINENT HISTORY: Scoliosis, Asthma,  migraines  PRECAUTIONS: none  SUBJECTIVE:                                                                                                                                                                                      SUBJECTIVE STATEMENT:  Patient reports she is about 50-60% better.  Still has flare ups but learning more about how to manage her pain.    PAIN:  Are you having pain? 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     PLOF: Independent   PATIENT GOALS: stop the pain   OBJECTIVE: (objective measures completed at initial evaluation unless otherwise dated)   DIAGNOSTIC FINDINGS:  Xray of cspine and tspine showing only scoliosis and curvature flattening.    PATIENT SURVEYS:  Initial eval: NDI 17/50  07/30/22: NDI 10/50 (improved by 7 points)   COGNITION: Overall cognitive status: Within functional limits for tasks assessed   SENSATION: Numbness and tingling in the morning taking a couple hours to stop   POSTURE: forward shoulders, thoracic kyphosis, increased lumbar lordosis    PALPATION: +1 ttp bil UT Rt>Lt, bil cervical, thoracic paraspinals from C5 to T12 with tightness evident in the upper back and shoulder area              CERVICAL ROM:    Active ROM A/PROM (deg) eval A/PROM (deg) 07/30/22  Flexion 37 - feels good 45  Extension 40 40  Right lateral flexion 32 55  Left lateral flexion 40 60  Right rotation 45 70  Left rotation 40 70   (Blank rows = not tested)   LUMBAR AROM: flexion: flat back and neck Extension: pn - hinge around T12  Side bend bil: WNL Rot: pn limited around 25% bil    THORACIC:   Seated flexion: WNL Ext: limited 50% Rotation and side bend: just tight feeling    UPPER EXTREMITY ROM: WNL: feels good stretch with overhead flexion and behind the head positions    TODAY'S TREATMENT:     DATE:  07/30/22: Re-assessment/Recert completed Manual Therapy Trigger Point Dry-Needling  Treatment instructions: Expect mild to moderate muscle soreness. S/S of pneumothorax if dry needled over a lung field, and to seek immediate medical attention should they occur. Patient verbalized understanding of these instructions and education. Patient Consent Given: Yes Education handout provided: Yes Muscles treated: bil cervical multifidi, bil upper traps, splenius capitus and cervicis, sub occipitals Treatment response/outcome: Skilled palpation used to identify taut bands and trigger points.  Once identified, dry needling techniques used to treat these areas.  Twitch response ellicited along with palpable elongation of muscle.  Following treatment, patient came to sit and was able to do full cervical  ROM without pain.  Mild soreness reported.   07/23/22: Therapeutic Exercises NuStep L5 x 8' PTA present to discuss status (seat 9/UE 9) Seated edge of mat for bil HS stretch, then figure 4 piriformis stretch 3x, 20 sec holds   FreeMotion standing on flat side of bosu 7# for bil UE scap retract, 2 x 10, biul IE ext 3# (7# too challenging on bosu) each with brief rest between sets Quadruped for opposite Rt UE/Lt LE x5 , then same on other side returning therapist demo; then into childs pose 3x, and then 1x to each side with 5 sec holds, then cat/camel stretch 3x each; tried prone press up but increased back pain so stopped after 1 rep Doorway pectoralis stretch 2x, 20 sec holds; followed by lat stretch in doorway Core Stabs in supine: Post pelvic tilt x 10 reps, 5 sec holds then tilt for following: clam (knees open/close) with green theraband held by pt x 10, alt march 2 x 10 each leg with 2# each  ankle, then alt heel slide as pt  was able to perform this today with control of tilt 2 x 10 each Manual Therapy STM in prone with cocoa butter to bil thoracic paraspinals at end of session  07/22/22: Therapeutic Exercises NuStep L5 x 8' PTA present to discuss status (seat 9/UE 9) Seated edge of mat for bil HS stretch, then figure 4 piriformis stretch 3x, 20 sec holds   FreeMotion 7# for bil UE ext, then scapular retraction 2 x 10 each with brief rest between sets Supine bridges with ball squeeze x10, 5 sec holds, then bridges with feet on Airex and therapist stabilizing Airex from sliding on mat Core Stabs in supine: Post pelvic tilt for following: clam (knees open/close), alt march 2 x 5 each leg, then alt LAQ (unable to hold tilt with heel slide) all with VC's to encourage pt to hold tilt throughout Supine scapular series with green theraband x10 each horz abd, bil er, and bil D2; added to HEP Manual Therapy STM seated edge of bed to bil thoracic paraspinals with cocoa butter at end of session    PATIENT EDUCATION:  Education details: Access Code: [email or web address] Person educated: Patient Education method: Explanation, Demonstration, and Handouts Education comprehension: verbalized understanding and needs further education   HOME EXERCISE PROGRAM: Access Code: [email or web address] URL: [email or web address] Date: 06/25/2022 Prepared by: Venetia Night Beuhring  Exercises - Correct Seated Posture  - 1 x daily - 7 x weekly - 1-3 sets - 10 reps - 20-30 seconds hold - Seated Scapular Retraction  - 1 x daily - 7 x weekly - 1-3 sets - 10 reps -   hold - Seated Cervical Sidebending Stretch  - 1 x daily - 7 x weekly - 1-3 sets - 3 reps - 20-30 seconds hold - Seated Levator Scapulae Stretch  - 1 x daily - 7 x weekly - 1-3 sets - 3 reps - 20-30 seconds hold - Seated Mid Back Stretch  - 1 x daily - 7 x weekly - 1-3 sets - 3 reps - 10 sec hold - Standing 'L' Stretch at Counter  - 1 x daily - 7 x weekly -  1-3 sets - 3 reps - 10 sec hold - Seated Flexion Stretch  - 1 x daily - 7 x weekly - 1 sets - 3 reps - 10 sec hold - Supine Lower Trunk Rotation  - 1 x daily - 7 x weekly - 1 sets - 3 reps - 10 sec hold - Sidelying Thoracic Rotation with Open Book  - 1 x daily - 7 x weekly - 1 sets - 10 reps - Standing Row with Anchored Resistance  - 1 x daily - 7 x weekly - 1 sets - 10 reps - Standing Shoulder Extension with Resistance  - 1 x daily - 7 x weekly - 1 sets - 10 reps - Supine Cervical Retraction with Towel  - 1 x daily - 7 x weekly - 1 sets - 10 reps - 5 sec hold  Added to [place] 07/22/22: - Supine Shoulder Horizontal Abduction with Resistance  - 2 x daily - 4-5 x weekly - 1-2 sets - 10 reps - 3 sec hold - Supine Shoulder External Rotation with Resistance  - 2 x daily - 4-5 x weekly - 1-2 sets - 10 reps - 3 sec hold - Supine narrow and wide grip flexion  - 4 x daily - 4-5 x weekly - 1-2 sets - 10 reps - 3 sec hold - Supine PNF D2   - 1 x daily -  4-5 x weekly - 1-2 sets - 10 reps - 3 sec hold   ASSESSMENT:   CLINICAL IMPRESSION: Shelby Obrien is progressing well toward all goals.  She is well motivated and as compliant as she can be with her work schedule and raising her 3 children.  She has significant improvements in all objective findings.     OBJECTIVE IMPAIRMENTS: decreased activity tolerance, decreased ROM, decreased strength, impaired flexibility, and pain.    ACTIVITY LIMITATIONS: carrying, lifting, bending, and sleeping   PARTICIPATION LIMITATIONS: none   PERSONAL FACTORS: Fitness are also affecting patient's functional outcome.    REHAB POTENTIAL: Good   CLINICAL DECISION MAKING: Stable/uncomplicated   EVALUATION COMPLEXITY: Low     GOALS: Goals reviewed with patient? Yes   SHORT TERM GOALS: Target date: 07/24/22   Pt will decrease pain after work activities to 5/10 or less to demonstrate improved pain.  Baseline: 10/10 Goal status:MET 07/30/22   2.  Pt will be ind with self  stretches and strengthening for continued postural improvements Baseline: changing position at work and home (07/02/22) Goal status: MET   3.  Pt will demonstrate proper work station and lifting techniques to prevent pain Baseline:  Goal status: MET 07/30/22   LONG TERM GOALS:    1. Patient to report pain no greater than 2/10     2. Patient to be independent with advanced HEP       3. NDI to be 6/10 or less      4.  Patient to report 85% improvement in overall symptoms    PLAN:   PT FREQUENCY: 1-2x/week (pt is able to do 4pm and days off only)   PT DURATION: for an additional 8 weeks, patient will report when she feels she has reach a manageable level of pain and goals are met   PLANNED INTERVENTIONS: Therapeutic exercises, Therapeutic activity, Neuromuscular re-education, Patient/Family education, Self Care, Joint mobilization, Dry Needling, Electrical stimulation, Moist heat, Taping, Traction, Manual therapy, and Re-evaluation   PLAN FOR NEXT SESSION: Progress HEP, DN, STM/MFR especially midline spine and UT, mod PRN, postural TE and lifting strength   Jaylynne Birkhead B. Tesha Archambeau, PT 07/30/22 11:46 AM [facility] [address] [place], [place] [address] Phone # [phone] Fax [phone]

## 2022-08-03 ENCOUNTER — Encounter: Payer: Self-pay | Admitting: Internal Medicine

## 2022-08-03 ENCOUNTER — Emergency Department
Admission: EM | Admit: 2022-08-03 | Discharge: 2022-08-03 | Disposition: A | Discharge status: Home / Self Care | Payer: BC Managed Care – PPO | Attending: Emergency Medicine | Admitting: Emergency Medicine

## 2022-08-03 ENCOUNTER — Emergency Department: Payer: BC Managed Care – PPO

## 2022-08-03 ENCOUNTER — Other Ambulatory Visit: Payer: Self-pay

## 2022-08-03 DIAGNOSIS — J45909 Unspecified asthma, uncomplicated: Secondary | ICD-10-CM | POA: Diagnosis not present

## 2022-08-03 DIAGNOSIS — R1012 Left upper quadrant pain: Secondary | ICD-10-CM | POA: Diagnosis not present

## 2022-08-03 DIAGNOSIS — R079 Chest pain, unspecified: Secondary | ICD-10-CM | POA: Diagnosis not present

## 2022-08-03 DIAGNOSIS — R109 Unspecified abdominal pain: Secondary | ICD-10-CM

## 2022-08-03 DIAGNOSIS — N390 Urinary tract infection, site not specified: Secondary | ICD-10-CM

## 2022-08-03 DIAGNOSIS — R0789 Other chest pain: Secondary | ICD-10-CM | POA: Diagnosis not present

## 2022-08-03 DIAGNOSIS — I10 Essential (primary) hypertension: Secondary | ICD-10-CM | POA: Insufficient documentation

## 2022-08-03 LAB — CBC
HCT: 41 % (ref 36.0–46.0)
Hemoglobin: 13.2 g/dL (ref 12.0–15.0)
MCH: 28.3 pg (ref 26.0–34.0)
MCHC: 32.2 g/dL (ref 30.0–36.0)
MCV: 88 fL (ref 80.0–100.0)
Platelets: 260 10*3/uL (ref 150–400)
RBC: 4.66 MIL/uL (ref 3.87–5.11)
RDW: 15 % (ref 11.5–15.5)
WBC: 8 10*3/uL (ref 4.0–10.5)
nRBC: 0 % (ref 0.0–0.2)

## 2022-08-03 LAB — URINALYSIS, ROUTINE W REFLEX MICROSCOPIC
Bilirubin Urine: NEGATIVE
Glucose, UA: NEGATIVE mg/dL
Hgb urine dipstick: NEGATIVE
Ketones, ur: NEGATIVE mg/dL
Nitrite: NEGATIVE
Protein, ur: 30 mg/dL — AB
Specific Gravity, Urine: 1.019 (ref 1.005–1.030)
pH: 7 (ref 5.0–8.0)

## 2022-08-03 LAB — COMPREHENSIVE METABOLIC PANEL
ALT: 13 U/L (ref 0–44)
AST: 22 U/L (ref 15–41)
Albumin: 4 g/dL (ref 3.5–5.0)
Alkaline Phosphatase: 95 U/L (ref 38–126)
Anion gap: 8 (ref 5–15)
BUN: 15 mg/dL (ref 6–20)
CO2: 25 mmol/L (ref 22–32)
Calcium: 8.9 mg/dL (ref 8.9–10.3)
Chloride: 102 mmol/L (ref 98–111)
Creatinine, Ser: 0.71 mg/dL (ref 0.44–1.00)
GFR, Estimated: >60 mL/min (ref >60)
Glucose, Bld: 96 mg/dL (ref 70–99)
Potassium: 3.8 mmol/L (ref 3.5–5.1)
Sodium: 135 mmol/L (ref 135–145)
Total Bilirubin: 1 mg/dL (ref 0.3–1.2)
Total Protein: 8.3 g/dL — ABNORMAL HIGH (ref 6.5–8.1)

## 2022-08-03 LAB — POC URINE PREG, ED: Preg Test, Ur: NEGATIVE

## 2022-08-03 LAB — LIPASE, BLOOD: Lipase: 30 U/L (ref 11–51)

## 2022-08-03 MED ORDER — HYDROCODONE-ACETAMINOPHEN 5-325 MG PO TABS: 1.0000 | ORAL_TABLET | ORAL | 0 refills | Status: DC | PRN

## 2022-08-03 MED ORDER — KETOROLAC TROMETHAMINE 30 MG/ML IJ SOLN
30.0000 mg | Freq: Once | INTRAMUSCULAR | Status: AC
  Administered 2022-08-03: 30 mg via INTRAVENOUS
  Filled 2022-08-03: qty 1

## 2022-08-03 MED ORDER — KETOROLAC TROMETHAMINE 10 MG PO TABS: 10.0000 mg | ORAL_TABLET | Freq: Four times a day (QID) | ORAL | 0 refills | Status: DC | PRN

## 2022-08-03 MED ORDER — CEFDINIR 300 MG PO CAPS: 300.0000 mg | ORAL_CAPSULE | Freq: Two times a day (BID) | ORAL | 0 refills | Status: AC

## 2022-08-03 NOTE — ED Triage Notes (Signed)
Pt to ED POV with husband for sharp L sided upper abdominal pain that radiates to back. Endorses pain under L ribcage with deep inspiration. Everything started about 3 hours ago. Pt has dry skin, unlabored respirations.

## 2022-08-03 NOTE — ED Provider Notes (Signed)
[facility] Provider Note   Event Date/Time   First MD Initiated Contact with Patient 08/03/22 1114     (approximate) History  Abdominal Pain and Back Pain  HPI Shelby Obrien is a 32 y.o. female with a stated past medical history of asthma, hypertension, and migraines who presents complaining of left sided lower anterior chest wall pain that radiates to her back.  Patient states that this pain started suddenly approximately 2 hours prior to arrival and kept her from working.  Patient states that this pain is worsened with deep inspiration or when reaching with her left hand.  Patient states that this pain has slowly been getting worse since onset starting at a 7 out of 10 and currently a 10/10.  Patient describes this pain as a burning/aching and denies any relieving factors.  Patient has not taken any medications for the symptoms.  Patient does endorse exertional worsening of this chest pain. ROS: Patient currently denies any vision changes, tinnitus, difficulty speaking, facial droop, sore throat, shortness of breath, abdominal pain, nausea/vomiting/diarrhea, dysuria, or weakness/numbness/paresthesias in any extremity   Physical Exam  Triage Vital Signs: ED Triage Vitals  Enc Vitals Group     BP 08/03/22 1056 (!) 155/104     Pulse Rate 08/03/22 1056 79     Resp 08/03/22 1056 16     Temp 08/03/22 1056 97.7 F (36.5 C)     Temp Source 08/03/22 1056 Oral     SpO2 08/03/22 1056 99 %     Weight 08/03/22 1057 260 lb (117.9 kg)     Height 08/03/22 1057 5\' 4"  (1.626 m)     Head Circumference --      Peak Flow --      Pain Score 08/03/22 1057 10     Pain Loc --      Pain Edu? --      Excl. in GC? --    Most recent vital signs: Vitals:   08/03/22 1056  BP: (!) 155/104  Pulse: 79  Resp: 16  Temp: 97.7 F (36.5 C)  SpO2: 99%   General: Awake, oriented x4. CV:  Good peripheral perfusion.  Resp:  Normal effort.  Abd:  No distention.  Other:  Obese  middle-aged African-American female laying in bed in mild distress secondary to pain ED Results / Procedures / Treatments  Labs (all labs ordered are listed, but only abnormal results are displayed) Labs Reviewed  COMPREHENSIVE METABOLIC PANEL - Abnormal; Notable for the following components:      Result Value   Total Protein 8.3 (*)    All other components within normal limits  URINALYSIS, ROUTINE W REFLEX MICROSCOPIC - Abnormal; Notable for the following components:   Color, Urine YELLOW (*)    APPearance CLOUDY (*)    Protein, ur 30 (*)    Leukocytes,Ua LARGE (*)    Bacteria, UA RARE (*)    All other components within normal limits  LIPASE, BLOOD  CBC  POC URINE PREG, ED   RADIOLOGY ED MD interpretation: 2 view chest x-ray interpreted by me shows no evidence of acute abnormalities including no pneumonia, pneumothorax, or widened mediastinum -Agree with radiology assessment Official radiology report(s): DG Chest 2 View  Result Date: 08/03/2022 CLINICAL DATA:  Provided history: Sudden onset sharp left-sided chest pain, worse with movement. EXAM: CHEST - 2 VIEW COMPARISON:  Chest radiograph 10/08/2021 and earlier FINDINGS: Heart size within normal limits. No appreciable airspace consolidation. No evidence of pleural effusion  or pneumothorax. No acute osseous abnormality identified. Degenerative changes of the spine. IMPRESSION: No evidence of active cardiopulmonary disease. Electronically Signed   By: Jackey Loge D.O.   On: 08/03/2022 11:52   PROCEDURES: Critical Care performed: No Procedures MEDICATIONS ORDERED IN ED: Medications  ketorolac (TORADOL) 30 MG/ML injection 30 mg (30 mg Intravenous Given 08/03/22 1216)   IMPRESSION / MDM / ASSESSMENT AND PLAN / ED COURSE  I reviewed the triage vital signs and the nursing notes.                             The patient is on the cardiac monitor to evaluate for evidence of arrhythmia and/or significant heart rate changes. Patient's  presentation is most consistent with acute presentation with potential threat to life or bodily function. 32 year old female presents for left flank pain Not Pregnant. Unlikely TOA, Ovarian Torsion, PID, gonorrhea/chlamydia. Low suspicion for Infected Urolithiasis, AAA, Cholecystitis, Pancreatitis, SBO, Appendicitis, or other acute abdomen. Chest x-ray negative, lipase negative, CBC and CMP without evidence of acute abnormalities.  UA does show evidence of urinary tract infection Rx: Cefdinir 300 mg BID for 5 days Disposition: Discharge home. SRP discussed. Advise follow up with primary care provider within 24-72 hours.   FINAL CLINICAL IMPRESSION(S) / ED DIAGNOSES   Final diagnoses:  Acute left flank pain  Urinary tract infection without hematuria, site unspecified   Rx / DC Orders   ED Discharge Orders          Ordered    cefdinir (OMNICEF) 300 MG capsule  2 times daily        08/03/22 1243    ketorolac (TORADOL) 10 MG tablet  Every 6 hours PRN       Note to Pharmacy: Patient given an IM/IV loading dose in emergency department   08/03/22 1243    HYDROcodone-acetaminophen (NORCO) 5-325 MG tablet  Every 4 hours PRN        08/03/22 1243           Note:  This document was prepared using Dragon voice recognition software and may include unintentional dictation errors.   Merwyn Katos, MD 08/03/22 1248

## 2022-08-03 NOTE — ED Notes (Signed)
Bloodwork to be done in room in case IV needed or d dimer ordered.

## 2022-08-07 ENCOUNTER — Ambulatory Visit: Payer: BC Managed Care – PPO

## 2022-08-07 DIAGNOSIS — M542 Cervicalgia: Secondary | ICD-10-CM | POA: Diagnosis not present

## 2022-08-07 DIAGNOSIS — R252 Cramp and spasm: Secondary | ICD-10-CM

## 2022-08-07 DIAGNOSIS — M546 Pain in thoracic spine: Secondary | ICD-10-CM

## 2022-08-07 DIAGNOSIS — R293 Abnormal posture: Secondary | ICD-10-CM

## 2022-08-07 DIAGNOSIS — M6281 Muscle weakness (generalized): Secondary | ICD-10-CM | POA: Diagnosis not present

## 2022-08-07 NOTE — Therapy (Signed)
OUTPATIENT PHYSICAL THERAPY TREATMENT NOTE      Patient Name: Shelby Obrien MRN: [id number] DOB:12-01-1990, 32 y.o., female Today's Date: 08/07/2022  PCP: none REFERRING PROVIDER: Dr. Gershon Crane, MD  END OF SESSION:   PT End of Session - 08/07/22 0908     Visit Number 11    Number of Visits 25    Date for PT Re-Evaluation 09/24/22    Progress Note Due on Visit 20    PT Start Time 0905    PT Stop Time 0952    PT Time Calculation (min) 47 min    Activity Tolerance Patient tolerated treatment well    Behavior During Therapy [facility] for tasks assessed/performed              Past Medical History:  Diagnosis Date   Anemia affecting pregnancy in third trimester    Asthma    Migraines    Obesity    BMI>40   Rapid palpitations 03/01/2021   Tachycardia, unspecified    Past Surgical History:  Procedure Laterality Date   WISDOM TOOTH EXTRACTION     Patient Active Problem List   Diagnosis Date Noted   Postpartum hypertension 09/08/2021   Vaginal delivery 08/05/2021   Symptomatic anemia 07/05/2021   Eczema 06/01/2019   Anxiety and depression 09/04/2017   Maternal obesity affecting pregnancy, antepartum 02/26/2017   Maternal morbid obesity, antepartum 02/26/2017   Migraines 10/07/2016   Asthma 10/07/2016    REFERRING DIAG:  M54.6 (ICD-10-CM) - Acute midline thoracic back pain M54.2 (ICD-10-CM) - Neck pain  THERAPY DIAG:  Cervicalgia  Pain in thoracic spine  Muscle weakness (generalized)  Cramp and spasm  Abnormal posture  Rationale for Evaluation and Treatment Rehabilitation  PERTINENT HISTORY: Scoliosis, Asthma, migraines  PRECAUTIONS: none  SUBJECTIVE:                                                                                                                                                                                      SUBJECTIVE STATEMENT:  I had a kidney infection and was out of commission for a week! I had excruciating back pain  and went to the hospital Sunday. I didn't know if I was going to be able to come today but it's been feeling better. My upper back and neck has been feeling a lot better.    PAIN:  Are you having pain? No, just feeling sore and tight T-spine to occiput. 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     PLOF: Independent  PATIENT GOALS: stop the pain   OBJECTIVE: (objective measures completed at initial evaluation unless otherwise dated)   DIAGNOSTIC FINDINGS:  Xray of cspine and tspine showing only scoliosis and curvature flattening.    PATIENT SURVEYS:  Initial eval: NDI 17/50  07/30/22: NDI 10/50 (improved by 7 points)   COGNITION: Overall cognitive status: Within functional limits for tasks assessed   SENSATION: Numbness and tingling in the morning taking a couple hours to stop   POSTURE: forward shoulders, thoracic kyphosis, increased lumbar lordosis    PALPATION: +1 ttp bil UT Rt>Lt, bil cervical, thoracic paraspinals from C5 to T12 with tightness evident in the upper back and shoulder area              CERVICAL ROM:    Active ROM A/PROM (deg) eval A/PROM (deg) 07/30/22  Flexion 37 - feels good 45  Extension 40 40  Right lateral flexion 32 55  Left lateral flexion 40 60  Right rotation 45 70  Left rotation 40 70   (Blank rows = not tested)   LUMBAR AROM: flexion: flat back and neck Extension: pn - hinge around T12  Side bend bil: WNL Rot: pn limited around 25% bil    THORACIC:  Seated flexion: WNL Ext: limited 50% Rotation and side bend: just tight feeling    UPPER EXTREMITY ROM: WNL: feels good stretch with overhead flexion and behind the head positions    TODAY'S TREATMENT:     DATE:  08/07/22; Therapeutic Exercises NuStep Level 4 seat 10/UE 8 x 8 mins  FreeMotion Machine: Chest Press 7#, 2 x 12 with VC's to  decrease scapular compensation; scap retraction 10#, 2 x 12; Lat pull downs 10#, 2 x 12 pt returned therapist demo for each and VC's for technique and core engaged Kneeling with one leg on mat table for unilateral rows with 8# 2 x 10 each arm returning demo Supine on foam roll for following: 3#: Horz abd 2 x 10, then alt UE flexion with 1 x 10; then bil UE abd "snow angel" x 5, 5-10 sec holds; bil D2 with 2# x 12 each Seated edge of mat table for bil HS and piriformis stretch x 2, 20 sec each; then lat stretch in doorway with side lean x 1, 15 sec hold, doorway pectoralis stretch x 2, 20 sec each  07/30/22: Re-assessment/Recert completed Manual Therapy Trigger Point Dry-Needling  Treatment instructions: Expect mild to moderate muscle soreness. S/S of pneumothorax if dry needled over a lung field, and to seek immediate medical attention should they occur. Patient verbalized understanding of these instructions and education. Patient Consent Given: Yes Education handout provided: Yes Muscles treated: bil cervical multifidi, bil upper traps, splenius capitus and cervicis, sub occipitals Treatment response/outcome: Skilled palpation used to identify taut bands and trigger points.  Once identified, dry needling techniques used to treat these areas.  Twitch response ellicited along with palpable elongation of muscle.  Following treatment, patient came to sit and was able to do full cervical  ROM without pain.  Mild soreness reported.   07/23/22: Therapeutic Exercises NuStep L5 x 8' PTA present to discuss status (seat 9/UE 9) Seated edge of mat for bil HS stretch, then figure 4 piriformis stretch 3x, 20 sec holds   FreeMotion standing on flat side of bosu 7# for bil UE scap retract, 2 x 10, biul IE ext 3# (7# too challenging on bosu) each with brief rest between sets Quadruped for opposite Rt UE/Lt LE x5 , then  same on other side returning therapist demo; then into childs pose 3x, and then 1x to each side  with 5 sec holds, then cat/camel stretch 3x each; tried prone press up but increased back pain so stopped after 1 rep Doorway pectoralis stretch 2x, 20 sec holds; followed by lat stretch in doorway Core Stabs in supine: Post pelvic tilt x 10 reps, 5 sec holds then tilt for following: clam (knees open/close) with green theraband held by pt x 10, alt march 2 x 10 each leg with 2# each ankle, then alt heel slide as pt was able to perform this today with control of tilt 2 x 10 each Manual Therapy STM in prone with cocoa butter to bil thoracic paraspinals at end of session     PATIENT EDUCATION:  Education details: Access Code: [email or web address] Person educated: Patient Education method: Explanation, Demonstration, and Handouts Education comprehension: verbalized understanding and needs further education   HOME EXERCISE PROGRAM: Access Code: [email or web address] URL: [email or web address] Date: 06/25/2022 Prepared by: Loistine Simas Beuhring  Exercises - Correct Seated Posture  - 1 x daily - 7 x weekly - 1-3 sets - 10 reps - 20-30 seconds hold - Seated Scapular Retraction  - 1 x daily - 7 x weekly - 1-3 sets - 10 reps -   hold - Seated Cervical Sidebending Stretch  - 1 x daily - 7 x weekly - 1-3 sets - 3 reps - 20-30 seconds hold - Seated Levator Scapulae Stretch  - 1 x daily - 7 x weekly - 1-3 sets - 3 reps - 20-30 seconds hold - Seated Mid Back Stretch  - 1 x daily - 7 x weekly - 1-3 sets - 3 reps - 10 sec hold - Standing 'L' Stretch at Counter  - 1 x daily - 7 x weekly - 1-3 sets - 3 reps - 10 sec hold - Seated Flexion Stretch  - 1 x daily - 7 x weekly - 1 sets - 3 reps - 10 sec hold - Supine Lower Trunk Rotation  - 1 x daily - 7 x weekly - 1 sets - 3 reps - 10 sec hold - Sidelying Thoracic Rotation with Open Book  - 1 x daily - 7 x weekly - 1 sets - 10 reps - Standing Row with Anchored Resistance  - 1 x daily - 7 x weekly - 1 sets - 10 reps - Standing Shoulder Extension with Resistance  - 1 x daily -  7 x weekly - 1 sets - 10 reps - Supine Cervical Retraction with Towel  - 1 x daily - 7 x weekly - 1 sets - 10 reps - 5 sec hold  Added to [employer] 07/22/22: - Supine Shoulder Horizontal Abduction with Resistance  - 2 x daily - 4-5 x weekly - 1-2 sets - 10 reps - 3 sec hold - Supine Shoulder External Rotation with Resistance  - 2 x daily - 4-5 x weekly - 1-2 sets - 10 reps - 3 sec hold - Supine narrow and wide grip flexion  - 4 x daily - 4-5 x weekly - 1-2 sets - 10 reps - 3 sec hold - Supine PNF D2   - 1 x daily - 4-5 x weekly - 1-2 sets - 10 reps - 3 sec hold   ASSESSMENT:   CLINICAL IMPRESSION: Progressed pts postural strength with machines and on foam roll. VC's for technique and core engagement throughout. Then ended session with bil LE and postural flexibility.  Educated pt that  as she further improves she can consider reducing freq to 1x/wk to work towards D/C. She verbalized good understanding.    OBJECTIVE IMPAIRMENTS: decreased activity tolerance, decreased ROM, decreased strength, impaired flexibility, and pain.    ACTIVITY LIMITATIONS: carrying, lifting, bending, and sleeping   PARTICIPATION LIMITATIONS: none   PERSONAL FACTORS: Fitness are also affecting patient's functional outcome.    REHAB POTENTIAL: Good   CLINICAL DECISION MAKING: Stable/uncomplicated   EVALUATION COMPLEXITY: Low     GOALS: Goals reviewed with patient? Yes   SHORT TERM GOALS: Target date: 07/24/22   Pt will decrease pain after work activities to 5/10 or less to demonstrate improved pain.  Baseline: 10/10 Goal status:MET 07/30/22   2.  Pt will be ind with self stretches and strengthening for continued postural improvements Baseline: changing position at work and home (07/02/22) Goal status: MET   3.  Pt will demonstrate proper work station and lifting techniques to prevent pain Baseline:  Goal status: MET 07/30/22   LONG TERM GOALS:    1. Patient to report pain no greater than 2/10     2.  Patient to be independent with advanced HEP       3. NDI to be 6/10 or less      4.  Patient to report 85% improvement in overall symptoms    PLAN:   PT FREQUENCY: 1-2x/week (pt is able to do 4pm and days off only)   PT DURATION: for an additional 8 weeks, patient will report when she feels she has reach a manageable level of pain and goals are met   PLANNED INTERVENTIONS: Therapeutic exercises, Therapeutic activity, Neuromuscular re-education, Patient/Family education, Self Care, Joint mobilization, Dry Needling, Electrical stimulation, Moist heat, Taping, Traction, Manual therapy, and Re-evaluation   PLAN FOR NEXT SESSION: Progress HEP, DN, STM/MFR especially midline spine and UT, mod PRN, postural TE and lifting strength   Berna Spare, PTA 08/07/22 9:52 AM [facility] [address] [place], [place] [address] Phone # [phone] Fax [phone]

## 2022-08-14 ENCOUNTER — Ambulatory Visit: Payer: BC Managed Care – PPO

## 2022-08-15 ENCOUNTER — Ambulatory Visit: Payer: BC Managed Care – PPO

## 2022-08-15 DIAGNOSIS — R252 Cramp and spasm: Secondary | ICD-10-CM

## 2022-08-15 DIAGNOSIS — M546 Pain in thoracic spine: Secondary | ICD-10-CM | POA: Diagnosis not present

## 2022-08-15 DIAGNOSIS — M542 Cervicalgia: Secondary | ICD-10-CM | POA: Diagnosis not present

## 2022-08-15 DIAGNOSIS — R293 Abnormal posture: Secondary | ICD-10-CM | POA: Diagnosis not present

## 2022-08-15 DIAGNOSIS — M6281 Muscle weakness (generalized): Secondary | ICD-10-CM | POA: Diagnosis not present

## 2022-08-15 NOTE — Therapy (Addendum)
OUTPATIENT PHYSICAL THERAPY TREATMENT NOTE      Patient Name: Shelby Obrien MRN: [id number] DOB:[birth year], 32 y.o., female Today's Date: 08/15/2022  PCP: none REFERRING PROVIDER: Dr. Gershon Crane, MD  END OF SESSION:   PT End of Session - 08/15/22 0807     Visit Number 12    Number of Visits 25    Date for PT Re-Evaluation 09/24/22    Progress Note Due on Visit 20    PT Start Time 0803    PT Stop Time 0844    PT Time Calculation (min) 41 min    Activity Tolerance Patient tolerated treatment well    Behavior During Therapy [facility] for tasks assessed/performed              Past Medical History:  Diagnosis Date   Anemia affecting pregnancy in third trimester    Asthma    Migraines    Obesity    BMI>40   Rapid palpitations 03/01/2021   Tachycardia, unspecified    Past Surgical History:  Procedure Laterality Date   WISDOM TOOTH EXTRACTION     Patient Active Problem List   Diagnosis Date Noted   Postpartum hypertension 09/08/2021   Vaginal delivery 08/05/2021   Symptomatic anemia 07/05/2021   Eczema 06/01/2019   Anxiety and depression 09/04/2017   Maternal obesity affecting pregnancy, antepartum 02/26/2017   Maternal morbid obesity, antepartum 02/26/2017   Migraines 10/07/2016   Asthma 10/07/2016    REFERRING DIAG:  M54.6 (ICD-10-CM) - Acute midline thoracic back pain M54.2 (ICD-10-CM) - Neck pain  THERAPY DIAG:  Cervicalgia  Pain in thoracic spine  Muscle weakness (generalized)  Cramp and spasm  Abnormal posture  Rationale for Evaluation and Treatment Rehabilitation  PERTINENT HISTORY: Scoliosis, Asthma, migraines  PRECAUTIONS: none  SUBJECTIVE:                                                                                                                                                                                      SUBJECTIVE STATEMENT:  Patient reports she is doing pretty well.  Good response to the dry needling.  Pain 5/10  today.     PAIN:  Are you having pain? Yes, 5/10.  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    PATIENT SURVEYS:  Initial eval: NDI 17/50  07/30/22: NDI 10/50 (improved by 7 points)   COGNITION: Overall cognitive status: Within functional limits for tasks assessed   SENSATION: Numbness and tingling in the morning taking a couple hours to stop   POSTURE: forward shoulders, thoracic kyphosis, increased lumbar lordosis    PALPATION: +1 ttp bil UT Rt>Lt, bil cervical, thoracic paraspinals from C5 to T12 with tightness evident in the upper back and shoulder area              CERVICAL ROM:    Active ROM A/PROM (deg) eval A/PROM (deg) 07/30/22  Flexion 37 - feels good 45  Extension 40 40  Right lateral flexion 32 55  Left lateral flexion 40 60  Right rotation 45 70  Left rotation 40 70   (Blank rows = not tested)   LUMBAR AROM: flexion: flat back and neck Extension: pn - hinge around T12  Side bend bil: WNL Rot: pn limited around 25% bil    THORACIC:  Seated flexion: WNL Ext: limited 50% Rotation and side bend: just tight feeling    UPPER EXTREMITY ROM: WNL: feels good stretch with overhead flexion and behind the head positions    TODAY'S TREATMENT:     DATE:  08/15/22; Nustep (UBE unavailable) x 7 min level 3  3 way scapular stabilization with blue loop x 10 each UE 4 D ball rolls x 20 each with light blue plyo ball (both) Tband shoulder extension and rows with green band with handles x 20 each  Manual Therapy Trigger Point Dry-Needling   Treatment instructions: Expect mild to moderate muscle soreness. S/S of pneumothorax if dry needled over a lung field, and to seek immediate medical attention should they occur. Patient verbalized understanding of these instructions and education. Patient Consent Given: Yes Education handout provided: Yes Muscles treated: bil cervical multifidi, bil upper traps, splenius capitus and cervicis, sub occipitals Treatment response/outcome: Skilled palpation used to identify taut bands and trigger points.  Once identified, dry needling techniques used to treat these areas.  Twitch response ellicited along with palpable elongation of muscle.  Following treatment, patient came to sit too fast and felt slightly light headed.  We had her lie back down and allowed her blood pressure to regulate.  She reported improvement in her shoulder pain and was able to do full cervical  ROM without pain.    08/07/22; Therapeutic Exercises NuStep Level 4 seat 10/UE 8 x 8 mins  FreeMotion Machine: Chest Press 7#, 2 x 12 with VC's to decrease scapular compensation; scap retraction 10#, 2 x 12; Lat pull downs 10#, 2 x 12 pt returned therapist demo for each and VC's for technique and core engaged Kneeling with one leg on mat table for unilateral rows with 8# 2 x 10 each arm returning demo Supine on foam roll for following: 3#: Horz abd 2 x 10, then alt UE flexion with 1 x 10; then bil UE abd "snow angel" x 5, 5-10 sec holds; bil D2 with 2# x 12 each Seated edge of mat table for bil HS and piriformis stretch x 2, 20 sec each; then lat stretch in doorway with side lean x 1, 15 sec hold, doorway pectoralis stretch x 2, 20 sec each  07/30/22: Re-assessment/Recert completed Manual Therapy Trigger Point Dry-Needling  Treatment instructions: Expect mild to moderate muscle soreness. S/S of pneumothorax if dry needled over a lung field, and to seek immediate medical attention should they  occur. Patient verbalized understanding of these  instructions and education. Patient Consent Given: Yes Education handout provided: Yes Muscles treated: bil cervical multifidi, bil upper traps, splenius capitus and cervicis, sub occipitals Treatment response/outcome: Skilled palpation used to identify taut bands and trigger points.  Once identified, dry needling techniques used to treat these areas.  Twitch response ellicited along with palpable elongation of muscle.  Following treatment, patient came to sit and was able to do full cervical  ROM without pain.  Mild soreness reported.   07/23/22: Therapeutic Exercises NuStep L5 x 8' PTA present to discuss status (seat 9/UE 9) Seated edge of mat for bil HS stretch, then figure 4 piriformis stretch 3x, 20 sec holds   FreeMotion standing on flat side of bosu 7# for bil UE scap retract, 2 x 10, biul IE ext 3# (7# too challenging on bosu) each with brief rest between sets Quadruped for opposite Rt UE/Lt LE x5 , then same on other side returning therapist demo; then into childs pose 3x, and then 1x to each side with 5 sec holds, then cat/camel stretch 3x each; tried prone press up but increased back pain so stopped after 1 rep Doorway pectoralis stretch 2x, 20 sec holds; followed by lat stretch in doorway Core Stabs in supine: Post pelvic tilt x 10 reps, 5 sec holds then tilt for following: clam (knees open/close) with green theraband held by pt x 10, alt march 2 x 10 each leg with 2# each ankle, then alt heel slide as pt was able to perform this today with control of tilt 2 x 10 each Manual Therapy STM in prone with cocoa butter to bil thoracic paraspinals at end of session     PATIENT EDUCATION:  Education details: Access Code: [email or web address] Person educated: Patient Education method: Explanation, Demonstration, and Handouts Education comprehension: verbalized understanding and needs further education   HOME EXERCISE PROGRAM: Access Code: [email or web address] URL: [email or web address] Date:  06/25/2022 Prepared by: Loistine Simas Beuhring  Exercises - Correct Seated Posture  - 1 x daily - 7 x weekly - 1-3 sets - 10 reps - 20-30 seconds hold - Seated Scapular Retraction  - 1 x daily - 7 x weekly - 1-3 sets - 10 reps -   hold - Seated Cervical Sidebending Stretch  - 1 x daily - 7 x weekly - 1-3 sets - 3 reps - 20-30 seconds hold - Seated Levator Scapulae Stretch  - 1 x daily - 7 x weekly - 1-3 sets - 3 reps - 20-30 seconds hold - Seated Mid Back Stretch  - 1 x daily - 7 x weekly - 1-3 sets - 3 reps - 10 sec hold - Standing 'L' Stretch at Counter  - 1 x daily - 7 x weekly - 1-3 sets - 3 reps - 10 sec hold - Seated Flexion Stretch  - 1 x daily - 7 x weekly - 1 sets - 3 reps - 10 sec hold - Supine Lower Trunk Rotation  - 1 x daily - 7 x weekly - 1 sets - 3 reps - 10 sec hold - Sidelying Thoracic Rotation with Open Book  - 1 x daily - 7 x weekly - 1 sets - 10 reps - Standing Row with Anchored Resistance  - 1 x daily - 7 x weekly - 1 sets - 10 reps - Standing Shoulder Extension with Resistance  - 1 x daily - 7 x weekly - 1 sets - 10 reps - Supine Cervical Retraction with Towel  -  1 x daily - 7 x weekly - 1 sets - 10 reps - 5 sec hold  Added to [employer] 07/22/22: - Supine Shoulder Horizontal Abduction with Resistance  - 2 x daily - 4-5 x weekly - 1-2 sets - 10 reps - 3 sec hold - Supine Shoulder External Rotation with Resistance  - 2 x daily - 4-5 x weekly - 1-2 sets - 10 reps - 3 sec hold - Supine narrow and wide grip flexion  - 4 x daily - 4-5 x weekly - 1-2 sets - 10 reps - 3 sec hold - Supine PNF D2   - 1 x daily - 4-5 x weekly - 1-2 sets - 10 reps - 3 sec hold   ASSESSMENT:   CLINICAL IMPRESSION: Nasiyah is progressing appropriately and approaching final goals.  She understands need to focus on posture and postural strength.  She would benefit from skilled PT for a few more visits of PT until she meets remaining goals.     OBJECTIVE IMPAIRMENTS: decreased activity tolerance, decreased  ROM, decreased strength, impaired flexibility, and pain.    ACTIVITY LIMITATIONS: carrying, lifting, bending, and sleeping   PARTICIPATION LIMITATIONS: none   PERSONAL FACTORS: Fitness are also affecting patient's functional outcome.    REHAB POTENTIAL: Good   CLINICAL DECISION MAKING: Stable/uncomplicated   EVALUATION COMPLEXITY: Low     GOALS: Goals reviewed with patient? Yes   SHORT TERM GOALS: Target date: 07/24/22   Pt will decrease pain after work activities to 5/10 or less to demonstrate improved pain.  Baseline: 10/10 Goal status:MET 07/30/22   2.  Pt will be ind with self stretches and strengthening for continued postural improvements Baseline: changing position at work and home (07/02/22) Goal status: MET   3.  Pt will demonstrate proper work station and lifting techniques to prevent pain Baseline:  Goal status: MET 07/30/22   LONG TERM GOALS:    1. Patient to report pain no greater than 2/10 (patient reported 5/10 on 08/15/22)    2. Patient to be independent with advanced HEP       3. NDI to be 6/10 or less      4.  Patient to report 85% improvement in overall symptoms    PLAN:   PT FREQUENCY: 1-2x/week (pt is able to do 4pm and days off only)   PT DURATION: for an additional 8 weeks, patient will report when she feels she has reach a manageable level of pain and goals are met   PLANNED INTERVENTIONS: Therapeutic exercises, Therapeutic activity, Neuromuscular re-education, Patient/Family education, Self Care, Joint mobilization, Dry Needling, Electrical stimulation, Moist heat, Taping, Traction, Manual therapy, and Re-evaluation   PLAN FOR NEXT SESSION: Update and progress HEP, postural strengthening,  DN, STM/MFR especially midline spine and UT, mod PRN, postural TE and lifting strength   Lashannon Bresnan B. Tyaire Odem, PT 08/15/22 8:46 AM  [facility] [address], [address] [place], [place] [address] Phone # [phone] Fax  [phone]  PHYSICAL THERAPY DISCHARGE SUMMARY  Visits from Start of Care: 12  Current functional level related to goals / functional outcomes: See above notes   Remaining deficits: Chronic neck and back pain   Education / Equipment: HEP  Plan: Patient agrees to discharge.  Patient goals were not met. Pt would like to stop therapy due to busy schedule.

## 2022-08-18 ENCOUNTER — Encounter: Payer: Self-pay | Admitting: Internal Medicine

## 2022-08-19 NOTE — Therapy (Deleted)
OUTPATIENT PHYSICAL THERAPY TREATMENT NOTE      Patient Name: Shelby Obrien MRN: [id number] DOB:[birth year], 32 y.o., female Today's Date: 08/19/2022  PCP: none REFERRING PROVIDER: Dr. Gershon Crane, MD  END OF SESSION:      Past Medical History:  Diagnosis Date   Anemia affecting pregnancy in third trimester    Asthma    Migraines    Obesity    BMI>40   Rapid palpitations 03/01/2021   Tachycardia, unspecified    Past Surgical History:  Procedure Laterality Date   WISDOM TOOTH EXTRACTION     Patient Active Problem List   Diagnosis Date Noted   Postpartum hypertension 09/08/2021   Vaginal delivery 08/05/2021   Symptomatic anemia 07/05/2021   Eczema 06/01/2019   Anxiety and depression 09/04/2017   Maternal obesity affecting pregnancy, antepartum 02/26/2017   Maternal morbid obesity, antepartum 02/26/2017   Migraines 10/07/2016   Asthma 10/07/2016    REFERRING DIAG:  M54.6 (ICD-10-CM) - Acute midline thoracic back pain M54.2 (ICD-10-CM) - Neck pain  THERAPY DIAG:  No diagnosis found.  Rationale for Evaluation and Treatment Rehabilitation  PERTINENT HISTORY: Scoliosis, Asthma, migraines  PRECAUTIONS: none  SUBJECTIVE:                                                                                                                                                                                      SUBJECTIVE STATEMENT:  Patient reports she is doing pretty well.  Good response to the dry needling.  Pain 5/10 today.     PAIN:  Are you having pain? Yes, 5/10.  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     PLOF: Independent   PATIENT GOALS: stop the pain   OBJECTIVE: (objective measures completed at initial evaluation unless otherwise dated)   DIAGNOSTIC FINDINGS:   Xray of cspine and tspine showing only scoliosis and curvature flattening.    PATIENT SURVEYS:  Initial eval: NDI 17/50  07/30/22: NDI 10/50 (improved by 7 points)   COGNITION: Overall cognitive status: Within functional limits for tasks assessed   SENSATION: Numbness and tingling in the morning taking a couple hours to stop   POSTURE: forward shoulders, thoracic kyphosis, increased lumbar lordosis    PALPATION: +1 ttp bil UT Rt>Lt, bil cervical, thoracic paraspinals from C5 to T12 with tightness evident in the upper back and shoulder area              CERVICAL ROM:    Active ROM A/PROM (  deg) eval A/PROM (deg) 07/30/22  Flexion 37 - feels good 45  Extension 40 40  Right lateral flexion 32 55  Left lateral flexion 40 60  Right rotation 45 70  Left rotation 40 70   (Blank rows = not tested)   LUMBAR AROM: flexion: flat back and neck Extension: pn - hinge around T12  Side bend bil: WNL Rot: pn limited around 25% bil    THORACIC:  Seated flexion: WNL Ext: limited 50% Rotation and side bend: just tight feeling    UPPER EXTREMITY ROM: WNL: feels good stretch with overhead flexion and behind the head positions    TODAY'S TREATMENT:     DATE:  08/15/22; Nustep (UBE unavailable) x 7 min level 3  3 way scapular stabilization with blue loop x 10 each UE 4 D ball rolls x 20 each with light blue plyo ball (both) Tband shoulder extension and rows with green band with handles x 20 each  Manual Therapy Trigger Point Dry-Needling  Treatment instructions: Expect mild to moderate muscle soreness. S/S of pneumothorax if dry needled over a lung field, and to seek immediate medical attention should they occur. Patient verbalized understanding of these instructions and education. Patient Consent Given: Yes Education handout provided: Yes Muscles treated: bil cervical multifidi, bil upper traps, splenius capitus and cervicis, sub occipitals Treatment response/outcome: Skilled palpation  used to identify taut bands and trigger points.  Once identified, dry needling techniques used to treat these areas.  Twitch response ellicited along with palpable elongation of muscle.  Following treatment, patient came to sit too fast and felt slightly light headed.  We had her lie back down and allowed her blood pressure to regulate.  She reported improvement in her shoulder pain and was able to do full cervical  ROM without pain.    08/07/22; Therapeutic Exercises NuStep Level 4 seat 10/UE 8 x 8 mins  FreeMotion Machine: Chest Press 7#, 2 x 12 with VC's to decrease scapular compensation; scap retraction 10#, 2 x 12; Lat pull downs 10#, 2 x 12 pt returned therapist demo for each and VC's for technique and core engaged Kneeling with one leg on mat table for unilateral rows with 8# 2 x 10 each arm returning demo Supine on foam roll for following: 3#: Horz abd 2 x 10, then alt UE flexion with 1 x 10; then bil UE abd "snow angel" x 5, 5-10 sec holds; bil D2 with 2# x 12 each Seated edge of mat table for bil HS and piriformis stretch x 2, 20 sec each; then lat stretch in doorway with side lean x 1, 15 sec hold, doorway pectoralis stretch x 2, 20 sec each  07/30/22: Re-assessment/Recert completed Manual Therapy Trigger Point Dry-Needling  Treatment instructions: Expect mild to moderate muscle soreness. S/S of pneumothorax if dry needled over a lung field, and to seek immediate medical attention should they occur. Patient verbalized understanding of these instructions and education. Patient Consent Given: Yes Education handout provided: Yes Muscles treated: bil cervical multifidi, bil upper traps, splenius capitus and cervicis, sub occipitals Treatment response/outcome: Skilled palpation used to identify taut bands and trigger points.  Once identified, dry needling techniques used to treat these areas.  Twitch response ellicited along with palpable elongation of muscle.  Following treatment, patient came  to sit and was able to do full cervical  ROM without pain.  Mild soreness reported.   07/23/22: Therapeutic Exercises NuStep L5 x 8' PTA present to discuss status (seat 9/UE 9)  Seated edge of mat for bil HS stretch, then figure 4 piriformis stretch 3x, 20 sec holds   FreeMotion standing on flat side of bosu 7# for bil UE scap retract, 2 x 10, biul IE ext 3# (7# too challenging on bosu) each with brief rest between sets Quadruped for opposite Rt UE/Lt LE x5 , then same on other side returning therapist demo; then into childs pose 3x, and then 1x to each side with 5 sec holds, then cat/camel stretch 3x each; tried prone press up but increased back pain so stopped after 1 rep Doorway pectoralis stretch 2x, 20 sec holds; followed by lat stretch in doorway Core Stabs in supine: Post pelvic tilt x 10 reps, 5 sec holds then tilt for following: clam (knees open/close) with green theraband held by pt x 10, alt march 2 x 10 each leg with 2# each ankle, then alt heel slide as pt was able to perform this today with control of tilt 2 x 10 each Manual Therapy STM in prone with cocoa butter to bil thoracic paraspinals at end of session     PATIENT EDUCATION:  Education details: Access Code: [email or web address] Person educated: Patient Education method: Explanation, Demonstration, and Handouts Education comprehension: verbalized understanding and needs further education   HOME EXERCISE PROGRAM: Access Code: [email or web address] URL: [email or web address] Date: 06/25/2022 Prepared by: Loistine Simas Beuhring  Exercises - Correct Seated Posture  - 1 x daily - 7 x weekly - 1-3 sets - 10 reps - 20-30 seconds hold - Seated Scapular Retraction  - 1 x daily - 7 x weekly - 1-3 sets - 10 reps -   hold - Seated Cervical Sidebending Stretch  - 1 x daily - 7 x weekly - 1-3 sets - 3 reps - 20-30 seconds hold - Seated Levator Scapulae Stretch  - 1 x daily - 7 x weekly - 1-3 sets - 3 reps - 20-30 seconds hold - Seated Mid Back  Stretch  - 1 x daily - 7 x weekly - 1-3 sets - 3 reps - 10 sec hold - Standing 'L' Stretch at Counter  - 1 x daily - 7 x weekly - 1-3 sets - 3 reps - 10 sec hold - Seated Flexion Stretch  - 1 x daily - 7 x weekly - 1 sets - 3 reps - 10 sec hold - Supine Lower Trunk Rotation  - 1 x daily - 7 x weekly - 1 sets - 3 reps - 10 sec hold - Sidelying Thoracic Rotation with Open Book  - 1 x daily - 7 x weekly - 1 sets - 10 reps - Standing Row with Anchored Resistance  - 1 x daily - 7 x weekly - 1 sets - 10 reps - Standing Shoulder Extension with Resistance  - 1 x daily - 7 x weekly - 1 sets - 10 reps - Supine Cervical Retraction with Towel  - 1 x daily - 7 x weekly - 1 sets - 10 reps - 5 sec hold  Added to [employer] 07/22/22: - Supine Shoulder Horizontal Abduction with Resistance  - 2 x daily - 4-5 x weekly - 1-2 sets - 10 reps - 3 sec hold - Supine Shoulder External Rotation with Resistance  - 2 x daily - 4-5 x weekly - 1-2 sets - 10 reps - 3 sec hold - Supine narrow and wide grip flexion  - 4 x daily - 4-5 x weekly - 1-2 sets - 10 reps - 3 sec hold - Supine PNF  D2   - 1 x daily - 4-5 x weekly - 1-2 sets - 10 reps - 3 sec hold   ASSESSMENT:   CLINICAL IMPRESSION: Daleigh is progressing appropriately and approaching final goals.  She understands need to focus on posture and postural strength.  She would benefit from skilled PT for a few more visits of PT until she meets remaining goals.     OBJECTIVE IMPAIRMENTS: decreased activity tolerance, decreased ROM, decreased strength, impaired flexibility, and pain.    ACTIVITY LIMITATIONS: carrying, lifting, bending, and sleeping   PARTICIPATION LIMITATIONS: none   PERSONAL FACTORS: Fitness are also affecting patient's functional outcome.    REHAB POTENTIAL: Good   CLINICAL DECISION MAKING: Stable/uncomplicated   EVALUATION COMPLEXITY: Low     GOALS: Goals reviewed with patient? Yes   SHORT TERM GOALS: Target date: 07/24/22   Pt will decrease  pain after work activities to 5/10 or less to demonstrate improved pain.  Baseline: 10/10 Goal status:MET 07/30/22   2.  Pt will be ind with self stretches and strengthening for continued postural improvements Baseline: changing position at work and home (07/02/22) Goal status: MET   3.  Pt will demonstrate proper work station and lifting techniques to prevent pain Baseline:  Goal status: MET 07/30/22   LONG TERM GOALS:    1. Patient to report pain no greater than 2/10 (patient reported 5/10 on 08/15/22)    2. Patient to be independent with advanced HEP       3. NDI to be 6/10 or less      4.  Patient to report 85% improvement in overall symptoms    PLAN:   PT FREQUENCY: 1-2x/week (pt is able to do 4pm and days off only)   PT DURATION: for an additional 8 weeks, patient will report when she feels she has reach a manageable level of pain and goals are met   PLANNED INTERVENTIONS: Therapeutic exercises, Therapeutic activity, Neuromuscular re-education, Patient/Family education, Self Care, Joint mobilization, Dry Needling, Electrical stimulation, Moist heat, Taping, Traction, Manual therapy, and Re-evaluation   PLAN FOR NEXT SESSION: Update and progress HEP, postural strengthening,  DN, STM/MFR especially midline spine and UT, mod PRN, postural TE and lifting strength   Royal Hawthorn PT, DPT 08/19/22  10:45 AM

## 2022-08-22 ENCOUNTER — Ambulatory Visit: Payer: BC Managed Care – PPO | Admitting: Physical Therapy

## 2022-08-27 NOTE — Progress Notes (Signed)
Pt seen for labs

## 2022-08-28 ENCOUNTER — Ambulatory Visit: Payer: BC Managed Care – PPO | Attending: Family Medicine

## 2022-08-28 DIAGNOSIS — M6281 Muscle weakness (generalized): Secondary | ICD-10-CM | POA: Insufficient documentation

## 2022-08-28 DIAGNOSIS — R252 Cramp and spasm: Secondary | ICD-10-CM | POA: Insufficient documentation

## 2022-08-28 DIAGNOSIS — M542 Cervicalgia: Secondary | ICD-10-CM | POA: Insufficient documentation

## 2022-08-28 DIAGNOSIS — M546 Pain in thoracic spine: Secondary | ICD-10-CM | POA: Insufficient documentation

## 2022-08-28 DIAGNOSIS — R293 Abnormal posture: Secondary | ICD-10-CM | POA: Insufficient documentation

## 2022-09-01 ENCOUNTER — Ambulatory Visit: Payer: BC Managed Care – PPO

## 2022-09-04 ENCOUNTER — Ambulatory Visit: Payer: BC Managed Care – PPO

## 2022-09-08 ENCOUNTER — Other Ambulatory Visit: Payer: Self-pay

## 2022-09-08 ENCOUNTER — Ambulatory Visit (HOSPITAL_COMMUNITY)
Admission: EM | Admit: 2022-09-08 | Discharge: 2022-09-08 | Disposition: A | Discharge status: Home / Self Care | Payer: BC Managed Care – PPO | Attending: Physician Assistant | Admitting: Physician Assistant

## 2022-09-08 ENCOUNTER — Encounter (HOSPITAL_COMMUNITY): Payer: Self-pay | Admitting: *Deleted

## 2022-09-08 DIAGNOSIS — J069 Acute upper respiratory infection, unspecified: Secondary | ICD-10-CM | POA: Diagnosis not present

## 2022-09-08 DIAGNOSIS — J4521 Mild intermittent asthma with (acute) exacerbation: Secondary | ICD-10-CM | POA: Insufficient documentation

## 2022-09-08 DIAGNOSIS — Z1152 Encounter for screening for COVID-19: Secondary | ICD-10-CM | POA: Insufficient documentation

## 2022-09-08 MED ORDER — PREDNISONE 20 MG PO TABS: 40.0000 mg | ORAL_TABLET | Freq: Every day | ORAL | 0 refills | Status: AC

## 2022-09-08 MED ORDER — ALBUTEROL SULFATE HFA 108 (90 BASE) MCG/ACT IN AERS: 2.0000 | INHALATION_SPRAY | Freq: Four times a day (QID) | RESPIRATORY_TRACT | 1 refills | Status: DC | PRN

## 2022-09-08 MED ORDER — PROMETHAZINE-DM 6.25-15 MG/5ML PO SYRP: 5.0000 mL | ORAL_SOLUTION | Freq: Two times a day (BID) | ORAL | 0 refills | Status: DC | PRN

## 2022-09-08 MED ORDER — BUDESONIDE-FORMOTEROL FUMARATE 80-4.5 MCG/ACT IN AERO: 2.0000 | INHALATION_SPRAY | Freq: Two times a day (BID) | RESPIRATORY_TRACT | 1 refills | Status: DC

## 2022-09-08 NOTE — ED Provider Notes (Signed)
[facility]    CSN: [id number] Arrival date & time: 09/08/22  1930      History   Chief Complaint Chief Complaint  Patient presents with   Cough   Nasal Congestion    HPI Shelby Obrien is a 32 y.o. female.   Patient presents today with a 2-day history of URI symptoms including cough, congestion, shortness of breath, sore throat.  Denies any chest pain, nausea, vomiting, diarrhea, fever.  Denies any known sick contacts.  She has had COVID in December 2023.  She has had COVID-19 vaccinations.  She does have a history of asthma and has been using her albuterol inhaler more frequently with last dose yesterday evening.  She has been taking Coricidin with minimal improvement of symptoms.  Denies any recent antibiotics or steroids.  She denies history of diabetes.  She is confident that she is not pregnant.    Past Medical History:  Diagnosis Date   Anemia affecting pregnancy in third trimester    Asthma    Migraines    Obesity    BMI>40   Rapid palpitations 03/01/2021   Tachycardia, unspecified     Patient Active Problem List   Diagnosis Date Noted   Postpartum hypertension 09/08/2021   Vaginal delivery 08/05/2021   Symptomatic anemia 07/05/2021   Eczema 06/01/2019   Anxiety and depression 09/04/2017   Maternal obesity affecting pregnancy, antepartum 02/26/2017   Maternal morbid obesity, antepartum (HCC) 02/26/2017   Migraines 10/07/2016   Asthma 10/07/2016    Past Surgical History:  Procedure Laterality Date   WISDOM TOOTH EXTRACTION      OB History     Gravida  4   Para  3   Term  2   Preterm  1   AB  1   Living  3      SAB  1   IAB      Ectopic      Multiple  0   Live Births  3            Home Medications    Prior to Admission medications   Medication Sig Start Date End Date Taking? Authorizing Provider  budesonide-formoterol (SYMBICORT) 80-4.5 MCG/ACT inhaler Inhale 2 puffs into the lungs in the morning and at  bedtime. 09/08/22  Yes Ezmae Speers K, PA-C  predniSONE (DELTASONE) 20 MG tablet Take 2 tablets (40 mg total) by mouth daily for 4 days. 09/08/22 09/12/22 Yes Anden Bartolo K, PA-C  promethazine-dextromethorphan (PROMETHAZINE-DM) 6.25-15 MG/5ML syrup Take 5 mLs by mouth 2 (two) times daily as needed for cough. 09/08/22  Yes Nicolaas Savo K, PA-C  albuterol (VENTOLIN HFA) 108 (90 Base) MCG/ACT inhaler Inhale 2 puffs into the lungs every 6 (six) hours as needed for wheezing or shortness of breath. 09/08/22   Kierstynn Babich, Noberto Retort, PA-C  cyclobenzaprine (FLEXERIL) 10 MG tablet Take 1 tablet (10 mg total) by mouth 3 (three) times daily as needed for muscle spasms. 05/27/22   Nelwyn Salisbury, MD  Guaifenesin 1200 MG TB12 Take 1 tablet (1,200 mg total) by mouth in the morning and at bedtime. 06/09/22   Carlisle Beers, FNP  ketorolac (TORADOL) 10 MG tablet Take 1 tablet (10 mg total) by mouth every 6 (six) hours as needed. 08/03/22   Merwyn Katos, MD  NIFEdipine (PROCARDIA-XL/NIFEDICAL-XL) 30 MG 24 hr tablet Take 1 tablet (30 mg total) by mouth daily. 11/05/21   Nelwyn Salisbury, MD  sertraline (ZOLOFT) 50 MG tablet TAKE 1  TABLET(50 MG) BY MOUTH DAILY 02/12/22   Nelwyn Salisbury, MD    Family History Family History  Problem Relation Age of Onset   Cervical cancer Mother    Diabetes Sister    Diabetes Paternal Uncle    Diabetes Maternal Grandmother    Heart disease Maternal Grandmother    Diabetes Paternal Grandmother    Heart disease Paternal Grandmother     Social History Social History   Tobacco Use   Smoking status: Former    Packs/day: 0    Types: Cigarettes    Quit date: 01/02/2021    Years since quitting: 1.6   Smokeless tobacco: Never  Vaping Use   Vaping Use: Never used  Substance Use Topics   Alcohol use: No   Drug use: No     Allergies   Iodine, Shellfish allergy, Banana, Blueberry flavor, Other, and Raspberry   Review of Systems Review of Systems  Constitutional:  Positive for  activity change. Negative for appetite change, fatigue and fever.  HENT:  Positive for congestion, postnasal drip and sore throat. Negative for sinus pressure and sneezing.   Respiratory:  Positive for cough and shortness of breath.   Cardiovascular:  Negative for chest pain.  Gastrointestinal:  Negative for abdominal pain, diarrhea, nausea and vomiting.  Neurological:  Negative for dizziness, light-headedness and headaches.     Physical Exam Triage Vital Signs ED Triage Vitals  Enc Vitals Group     BP 09/08/22 2024 (!) 150/101     Pulse Rate 09/08/22 2024 83     Resp 09/08/22 2024 20     Temp 09/08/22 2024 98.3 F (36.8 C)     Temp src --      SpO2 09/08/22 2024 98 %     Weight --      Height --      Head Circumference --      Peak Flow --      Pain Score 09/08/22 2025 8     Pain Loc --      Pain Edu? --      Excl. in GC? --    No data found.  Updated Vital Signs BP (!) 150/101   Pulse 83   Temp 98.3 F (36.8 C)   Resp 20   SpO2 98%   Visual Acuity Right Eye Distance:   Left Eye Distance:   Bilateral Distance:    Right Eye Near:   Left Eye Near:    Bilateral Near:     Physical Exam Vitals reviewed.  Constitutional:      General: She is awake. She is not in acute distress.    Appearance: Normal appearance. She is well-developed. She is not ill-appearing.     Comments: Very pleasant female appears stated age in no acute distress sitting comfortably in exam room  HENT:     Head: Normocephalic and atraumatic.     Right Ear: Tympanic membrane, ear canal and external ear normal. Tympanic membrane is not erythematous or bulging.     Left Ear: Tympanic membrane, ear canal and external ear normal. Tympanic membrane is not erythematous or bulging.     Nose:     Right Sinus: No maxillary sinus tenderness or frontal sinus tenderness.     Left Sinus: No maxillary sinus tenderness or frontal sinus tenderness.     Mouth/Throat:     Pharynx: Uvula midline. No  oropharyngeal exudate or posterior oropharyngeal erythema.  Cardiovascular:     Rate and Rhythm: Normal  rate and regular rhythm.     Heart sounds: Normal heart sounds, S1 normal and S2 normal. No murmur heard. Pulmonary:     Effort: Pulmonary effort is normal.     Breath sounds: Normal breath sounds. No wheezing, rhonchi or rales.     Comments: Clear to auscultation bilaterally Psychiatric:        Behavior: Behavior is cooperative.      UC Treatments / Results  Labs (all labs ordered are listed, but only abnormal results are displayed) Labs Reviewed  SARS CORONAVIRUS 2 (TAT 6-24 HRS)    EKG   Radiology No results found.  Procedures Procedures (including critical care time)  Medications Ordered in UC Medications - No data to display  Initial Impression / Assessment and Plan / UC Course  I have reviewed the triage vital signs and the nursing notes.  Pertinent labs & imaging results that were available during my care of the patient were reviewed by me and considered in my medical decision making (see chart for details).     Patient is well-appearing, afebrile, nontoxic, nontachycardic.  Chest x-ray was deferred as oxygen saturation was 98% and she had no adventitious lung sounds on exam.  No evidence of acute infection on physical exam that would warrant initiation of antibiotics.  Did discuss potential utility of viral testing.  Patient was tested for COVID and this is pending.  Because of her history of asthma she is a candidate for antiviral therapy.  Last metabolic panel was obtained 08/03/2022 with creatinine of 0.71 and EGFR greater than 60 mL/min.  If she is positive for COVID and requires Paxlovid she would need to hold her nifedipine and cyclobenzaprine while on this medication over 3 days after completing course.  I am concerned that what ever viral illness she has has also triggered her asthma.  Will start prednisone burst of 40 mg for 4 days.  She was also started on  Symbicort with instruction to rinse her mouth following use of this medication.  Refill of albuterol was provided to have on hand.  Will use Promethazine DM for cough.  Discussed that this can be sedating.  She is to use over-the-counter medications including Mucinex, Flonase, Tylenol.  Recommended nasal saline and sinus rinses.  If her symptoms or not improving within a week she is to return for reevaluation.  If she has any worsening symptoms including high fever, chest pain, shortness of breath, worsening cough, weakness she needs to be seen immediately.  Should return precautions given.  Work excuse note provided.  Final Clinical Impressions(s) / UC Diagnoses   Final diagnoses:  Upper respiratory tract infection, unspecified type  Mild intermittent asthma with acute exacerbation     Discharge Instructions      I believe you have a virus that is triggered your asthma.  Please use albuterol before describes as needed.  Start Symbicort (budesonide/formoterol) twice daily.  Rinse your mouth following use of this medication to prevent thrush.  Start prednisone 40 mg for 4 days.  Do not take NSAIDs with this medication including aspirin, ibuprofen/Advil, naproxen/Aleve.  Use Promethazine DM for cough.  This make you sleepy so do not drive drink alcohol with taking it.  We will contact you if you are positive for COVID.  You can use over-the-counter medication including Mucinex, Flonase, Tylenol.  I also recommend nasal saline and sinus rinses.  If your symptoms or not improving within a week please return for reevaluation.  If anything worsens you should  be seen immediately including high fever, worsening cough, shortness of breath, chest pain.    ED Prescriptions     Medication Sig Dispense Auth. Provider   albuterol (VENTOLIN HFA) 108 (90 Base) MCG/ACT inhaler Inhale 2 puffs into the lungs every 6 (six) hours as needed for wheezing or shortness of breath. 8 g Elbia Paro K, PA-C   predniSONE  (DELTASONE) 20 MG tablet Take 2 tablets (40 mg total) by mouth daily for 4 days. 8 tablet Odester Nilson K, PA-C   budesonide-formoterol (SYMBICORT) 80-4.5 MCG/ACT inhaler Inhale 2 puffs into the lungs in the morning and at bedtime. 1 each Frona Yost K, PA-C   promethazine-dextromethorphan (PROMETHAZINE-DM) 6.25-15 MG/5ML syrup Take 5 mLs by mouth 2 (two) times daily as needed for cough. 118 mL Seema Blum K, PA-C      PDMP not reviewed this encounter.   Jeani Hawking, PA-C 09/08/22 2103

## 2022-09-08 NOTE — ED Triage Notes (Signed)
Pt reports cough and congestion  with increased use of Inhaler.

## 2022-09-08 NOTE — Discharge Instructions (Signed)
I believe you have a virus that is triggered your asthma.  Please use albuterol before describes as needed.  Start Symbicort (budesonide/formoterol) twice daily.  Rinse your mouth following use of this medication to prevent thrush.  Start prednisone 40 mg for 4 days.  Do not take NSAIDs with this medication including aspirin, ibuprofen/Advil, naproxen/Aleve.  Use Promethazine DM for cough.  This make you sleepy so do not drive drink alcohol with taking it.  We will contact you if you are positive for COVID.  You can use over-the-counter medication including Mucinex, Flonase, Tylenol.  I also recommend nasal saline and sinus rinses.  If your symptoms or not improving within a week please return for reevaluation.  If anything worsens you should be seen immediately including high fever, worsening cough, shortness of breath, chest pain.

## 2022-09-09 LAB — SARS CORONAVIRUS 2 (TAT 6-24 HRS): SARS Coronavirus 2: NEGATIVE

## 2022-09-10 ENCOUNTER — Ambulatory Visit: Payer: BC Managed Care – PPO

## 2022-09-16 ENCOUNTER — Ambulatory Visit: Payer: BC Managed Care – PPO

## 2022-09-24 ENCOUNTER — Ambulatory Visit: Payer: BC Managed Care – PPO

## 2022-10-08 ENCOUNTER — Ambulatory Visit: Payer: BC Managed Care – PPO | Admitting: Family Medicine

## 2022-10-17 ENCOUNTER — Ambulatory Visit (INDEPENDENT_AMBULATORY_CARE_PROVIDER_SITE_OTHER): Payer: BC Managed Care – PPO | Admitting: Family Medicine

## 2022-10-17 ENCOUNTER — Encounter: Payer: Self-pay | Admitting: Family Medicine

## 2022-10-17 DIAGNOSIS — M25561 Pain in right knee: Secondary | ICD-10-CM

## 2022-10-17 DIAGNOSIS — G8929 Other chronic pain: Secondary | ICD-10-CM

## 2022-10-17 DIAGNOSIS — L0292 Furuncle, unspecified: Secondary | ICD-10-CM | POA: Diagnosis not present

## 2022-10-17 DIAGNOSIS — M25512 Pain in left shoulder: Secondary | ICD-10-CM | POA: Diagnosis not present

## 2022-10-17 MED ORDER — DOXYCYCLINE HYCLATE 100 MG PO CAPS: 100.0000 mg | ORAL_CAPSULE | Freq: Two times a day (BID) | ORAL | 0 refills | Status: AC

## 2022-10-17 MED ORDER — PHENTERMINE HCL 37.5 MG PO CAPS: 37.5000 mg | ORAL_CAPSULE | ORAL | 1 refills | Status: DC

## 2022-10-17 NOTE — Progress Notes (Signed)
   Subjective:    Patient ID: Shelby Obrien, female    DOB: 1990-10-28, 32 y.o.   MRN: [id number]  HPI Here for several issues. First she has had pain in the right knee for about 3 months. No hx of trauma. Using ice and taking Ibuprofen. She has also had pain in the left shoulder for the past 2 weeks. This began after she spent a few hours throwing footballs. The pain does not go down the arm. She has applied ice and taken Ibuprofen. Third she has had a painful area on her abdomen for about one week. No fever. Lastly she asks for help to lose weight. She has exercised and made great dietary changes like stopping sodas, not eating take out foods, etc but she continues to gain weight.    Review of Systems  Constitutional: Negative.   Respiratory: Negative.    Cardiovascular: Negative.   Gastrointestinal:  Positive for abdominal pain.  Musculoskeletal:  Positive for arthralgias.       Objective:   Physical Exam Constitutional:      General: She is not in acute distress.    Appearance: She is obese.  Cardiovascular:     Rate and Rhythm: Normal rate and regular rhythm.     Pulses: Normal pulses.     Heart sounds: Normal heart sounds.  Pulmonary:     Effort: Pulmonary effort is normal.     Breath sounds: Normal breath sounds.  Abdominal:     Comments: There is a tender firm mass just under the skin on her abdomen midway between the umbilicus and the pubis. She has a large pannus   Musculoskeletal:     Comments: She is tender in the anterior left shoulder. ROM is full. This may be biceps tendonitis. She is also tender in the media joint space of the right knee. No warmth or swelling. ROM is full   Neurological:     Mental Status: She is alert.           Assessment & Plan:  For the knee pain and shoulder pain, we will refer her to Sports Medicine. She has a boil on the abdomen, and we will treat this with 10 days of Doxycycline. For the obesity, she will try taking  Phentermine daily. We will also refer her to Nutrition.  Gershon Crane, MD

## 2022-10-21 ENCOUNTER — Ambulatory Visit (INDEPENDENT_AMBULATORY_CARE_PROVIDER_SITE_OTHER): Payer: BC Managed Care – PPO

## 2022-10-21 ENCOUNTER — Encounter: Payer: Self-pay | Admitting: Family Medicine

## 2022-10-21 ENCOUNTER — Ambulatory Visit (INDEPENDENT_AMBULATORY_CARE_PROVIDER_SITE_OTHER): Payer: BC Managed Care – PPO | Admitting: Family Medicine

## 2022-10-21 ENCOUNTER — Other Ambulatory Visit: Payer: Self-pay

## 2022-10-21 VITALS — BP 122/84 | HR 87 | Ht 64.0 in | Wt 297.0 lb

## 2022-10-21 DIAGNOSIS — G8929 Other chronic pain: Secondary | ICD-10-CM

## 2022-10-21 DIAGNOSIS — M25512 Pain in left shoulder: Secondary | ICD-10-CM

## 2022-10-21 DIAGNOSIS — M25561 Pain in right knee: Secondary | ICD-10-CM

## 2022-10-21 NOTE — Patient Instructions (Addendum)
Thank you for coming in today.   Please get an Xray today before you leave   You received an injection today. Seek immediate medical attention if the joint becomes red, extremely painful, or is oozing fluid.   I've referred you to Physical Therapy.  Let us know if you don't hear from them in one week.   Check back in 2 months

## 2022-10-21 NOTE — Progress Notes (Unsigned)
   I, Stevenson Clinch, CMA acting as a scribe for Clementeen Graham, MD.  Shelby Obrien is a 32 y.o. female who presents to [facility] at [facility] today for L shoulder pain ongoing for about 3 weeks. She noticed the shoulder pain after throwing football passes.   Neck pain: left side neck pain Radiates: when reaching back, denies n/t Aggravates: reaching up and reaching back, lifting  Treatments tried: IBU  She also c/o R knee pain ongoing for about 3+ months w/ no MOI. Pt locates pain to medial aspect of the knee. Denies recent or past injury to the knee. Swelling present. Mechanical sx present, painful when popping. Notes radiating pain down the lower leg to the ankle. Sx worse with WB and ambulation. Sx worse when ascending.  R Knee swelling: no Mechanical symptoms: yes Radiates: lower leg Aggravates: WB, stairs Treatments tried: ice, IBU  Pertinent review of systems: ***  Relevant historical information: ***   Exam:  There were no vitals taken for this visit. General: Well Developed, well nourished, and in no acute distress.   MSK: ***    Lab and Radiology Results No results found for this or any previous visit (from the past 72 hour(s)). No results found.     Assessment and Plan: 32 y.o. female with ***   PDMP not reviewed this encounter. No orders of the defined types were placed in this encounter.  No orders of the defined types were placed in this encounter.    Discussed warning signs or symptoms. Please see discharge instructions. Patient expresses understanding.   ***

## 2022-10-27 NOTE — Progress Notes (Signed)
Right knee x-ray looks okay to radiology.  No fractures are visible.

## 2022-10-27 NOTE — Progress Notes (Signed)
Left shoulder x-ray looks okay to radiology

## 2022-11-06 ENCOUNTER — Ambulatory Visit: Payer: BC Managed Care – PPO | Attending: Family Medicine | Admitting: Physical Therapy

## 2022-11-06 DIAGNOSIS — M25562 Pain in left knee: Secondary | ICD-10-CM | POA: Insufficient documentation

## 2022-11-06 DIAGNOSIS — R2689 Other abnormalities of gait and mobility: Secondary | ICD-10-CM | POA: Insufficient documentation

## 2022-11-06 DIAGNOSIS — R252 Cramp and spasm: Secondary | ICD-10-CM | POA: Insufficient documentation

## 2022-11-06 DIAGNOSIS — M6281 Muscle weakness (generalized): Secondary | ICD-10-CM | POA: Insufficient documentation

## 2022-11-06 DIAGNOSIS — R293 Abnormal posture: Secondary | ICD-10-CM | POA: Insufficient documentation

## 2022-11-06 DIAGNOSIS — M25561 Pain in right knee: Secondary | ICD-10-CM | POA: Insufficient documentation

## 2022-11-08 IMAGING — CR DG CHEST 2V
2 series · 2 of 2 positions shown · non-contrast
Comparison: Chest x-ray 10/07/2021

CLINICAL DATA: Chest pain.

EXAM:
CHEST - 2 VIEW

[chest pa]
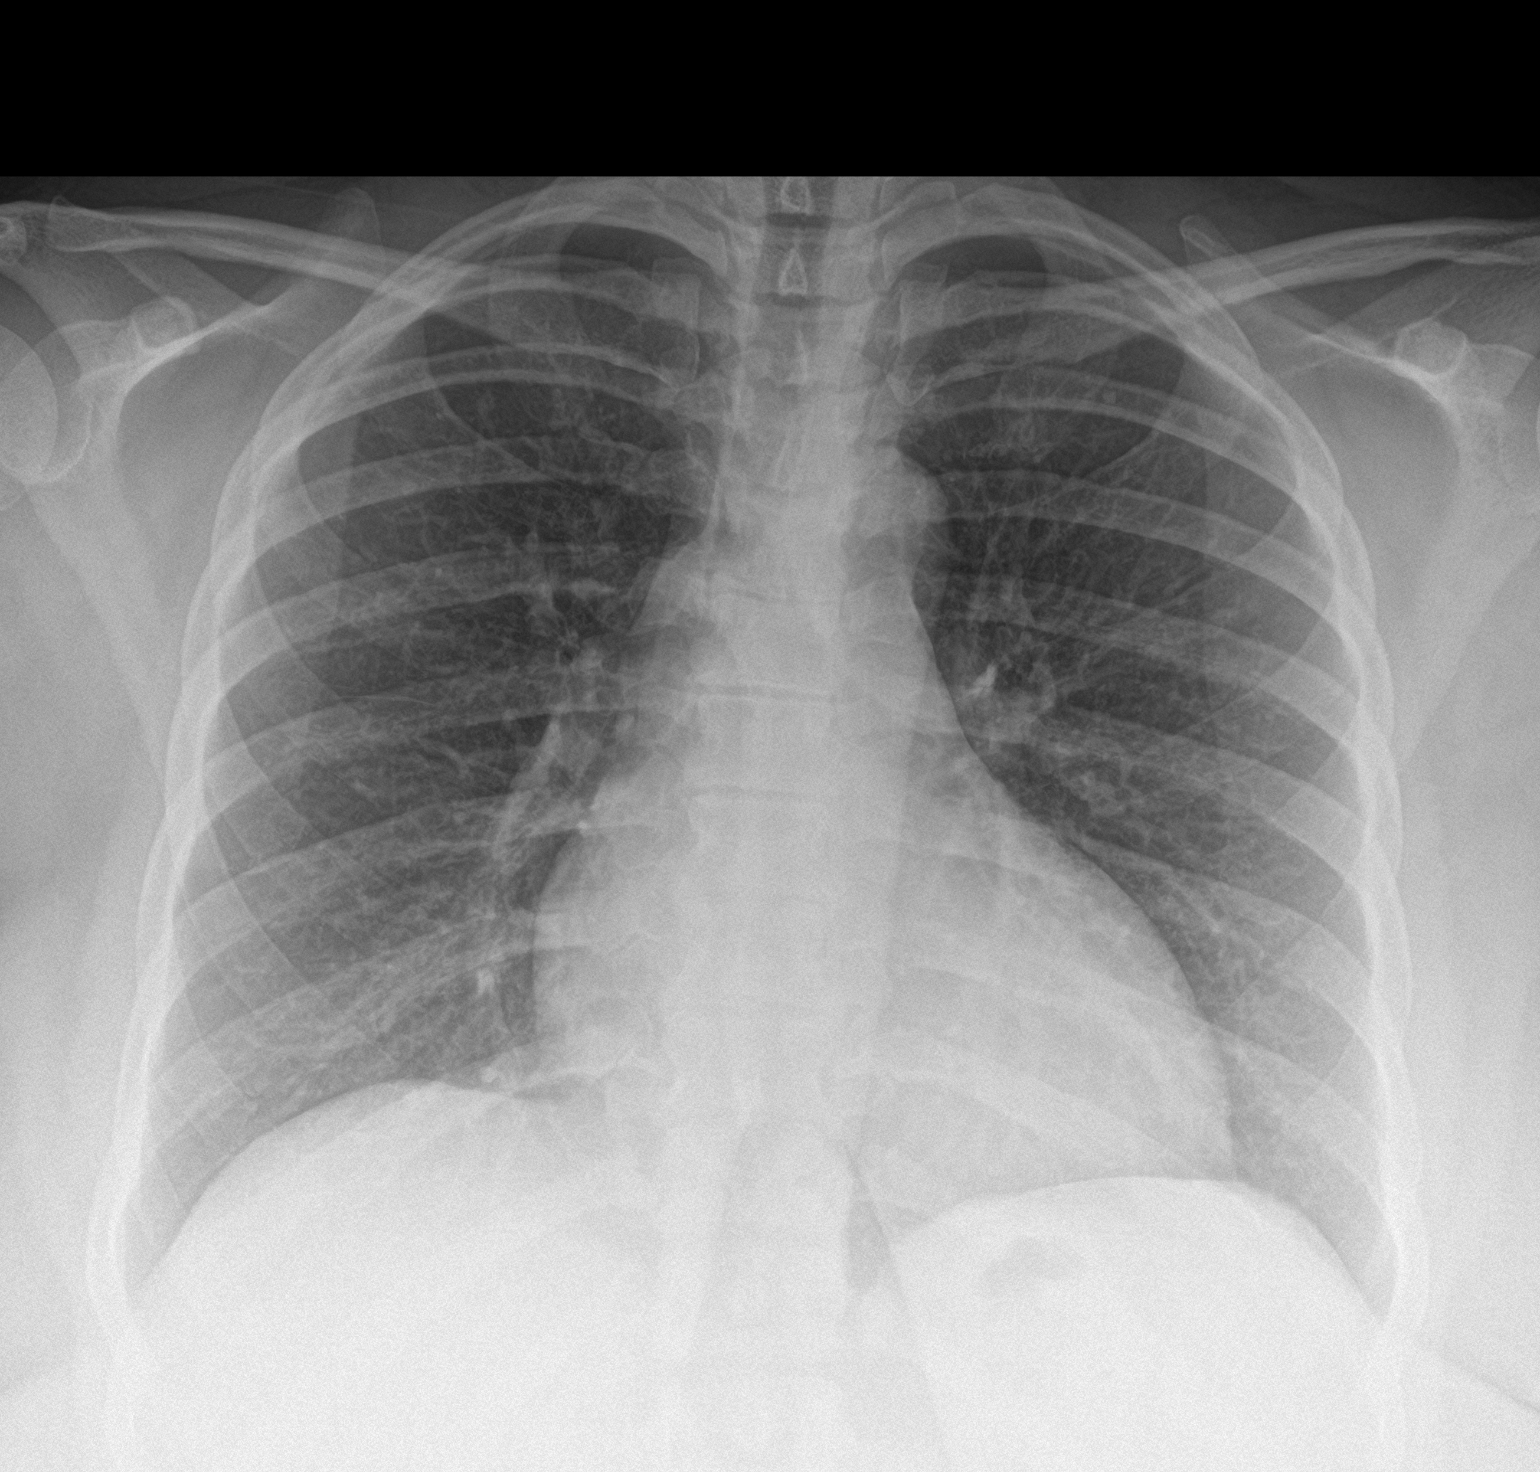

[chest lat]
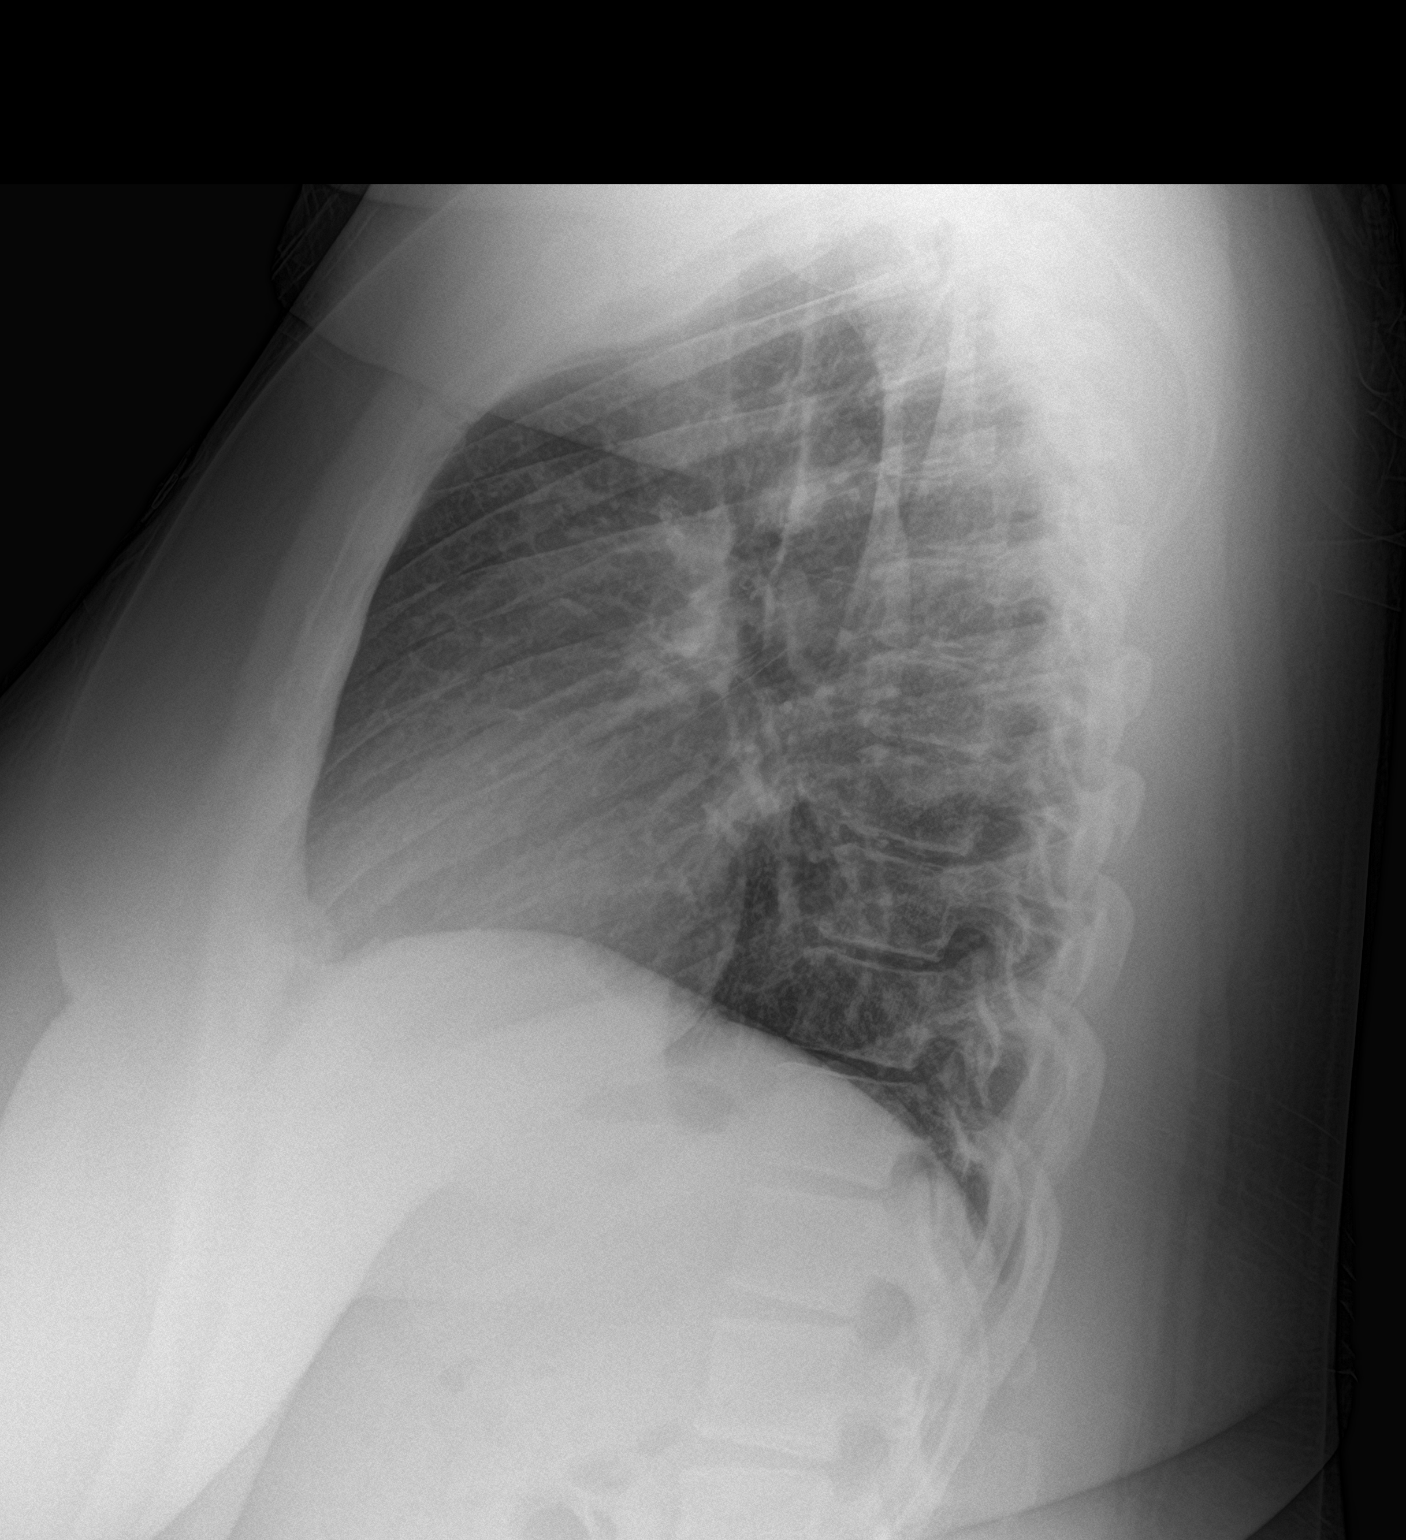

[2 of 2 positions shown; findings below may reference images not displayed]

FINDINGS: The heart size and mediastinal contours are within normal limits.
Both lungs are clear. The visualized skeletal structures are
unremarkable.
IMPRESSION: No active cardiopulmonary disease.

## 2022-11-08 IMAGING — NM NM PULMONARY PERF PARTICULATE
1 series · 8 of 8 positions shown · non-contrast
Comparison: Chest radiograph, 10/07/2021

CLINICAL DATA: PE suspected

EXAM:
NUCLEAR MEDICINE PERFUSION LUNG SCAN
TECHNIQUE: Perfusion images were obtained in multiple projections after
intravenous injection of radiopharmaceutical.
Ventilation scans intentionally deferred if perfusion scan and chest
x-ray adequate for interpretation during COVID 19 epidemic.
RADIOPHARMACEUTICALS:  4.09 mCi Oc-QQm MAA IV

[Series 1000: lung perfusion · 1.65mm/px · 4 acquisitions, 8 frames shown]
[im 1/4]
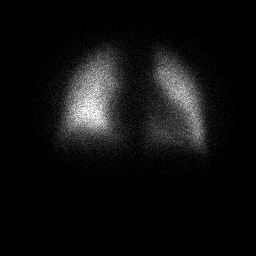
[im 1/4]
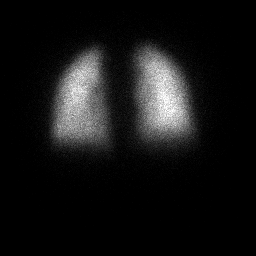
[im 2/4]
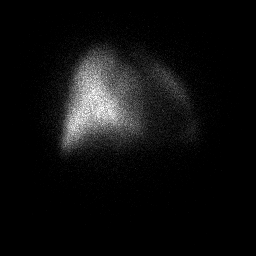
[im 2/4]
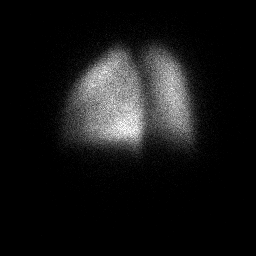
[im 3/4]
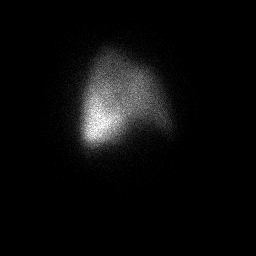
[im 3/4]
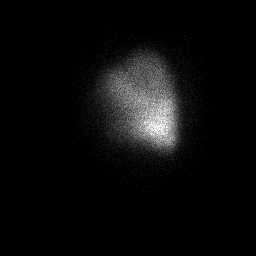
[im 4/4]
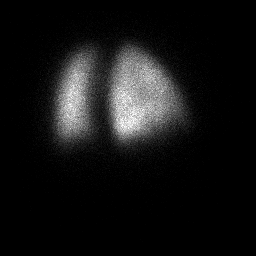
[im 4/4]
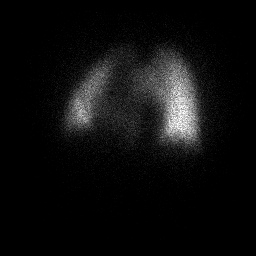

[8 of 8 positions shown; findings below may reference images not displayed]

FINDINGS: Normal, homogeneous perfusion of the lungs bilaterally. No
suspicious perfusion defect.
IMPRESSION: Very low probability examination for pulmonary embolism by modified
perfusion only PIOPED criteria (PE absent).

## 2022-11-10 ENCOUNTER — Ambulatory Visit: Payer: BC Managed Care – PPO | Admitting: Physical Therapy

## 2022-11-10 NOTE — Therapy (Deleted)
OUTPATIENT PHYSICAL THERAPY LOWER EXTREMITY EVALUATION   Patient Name: Shelby Obrien MRN: [id number] DOB:[birth year], 32 y.o., female Today's Date: 11/10/2022  END OF SESSION:   Past Medical History:  Diagnosis Date   Anemia affecting pregnancy in third trimester    Asthma    Migraines    Obesity    BMI>40   Rapid palpitations 03/01/2021   Tachycardia, unspecified    Past Surgical History:  Procedure Laterality Date   WISDOM TOOTH EXTRACTION     Patient Active Problem List   Diagnosis Date Noted   Postpartum hypertension 09/08/2021   Vaginal delivery 08/05/2021   Symptomatic anemia 07/05/2021   Eczema 06/01/2019   Anxiety and depression 09/04/2017   Maternal obesity affecting pregnancy, antepartum 02/26/2017   Maternal morbid obesity, antepartum (HCC) 02/26/2017   Migraines 10/07/2016   Asthma 10/07/2016    PCP: ***  REFERRING PROVIDER: ***  REFERRING DIAG: ***  THERAPY DIAG:  No diagnosis found.  Rationale for Evaluation and Treatment: {HABREHAB:27488}  ONSET DATE: ***  SUBJECTIVE:   SUBJECTIVE STATEMENT: ***  PERTINENT HISTORY: *** PAIN:  Are you having pain? {OPRCPAIN:27236}  PRECAUTIONS: {Therapy precautions:24002}  RED FLAGS: {PT Red Flags:29287}   WEIGHT BEARING RESTRICTIONS: {Yes ***/No:24003}  FALLS:  Has patient fallen in last 6 months? {fallsyesno:27318}  LIVING ENVIRONMENT: Lives with: {OPRC lives with:25569::"lives with their family"} Lives in: {Lives in:25570} Stairs: {opstairs:27293} Has following equipment at home: {Assistive devices:23999}  OCCUPATION: ***  PLOF: {PLOF:24004}  PATIENT GOALS: ***  NEXT MD VISIT: ***  OBJECTIVE:   DIAGNOSTIC FINDINGS: ***  PATIENT SURVEYS:  {rehab surveys:24030}  COGNITION: Overall cognitive status: {cognition:24006}     SENSATION: {sensation:27233}  EDEMA:  {edema:24020}  MUSCLE LENGTH: Hamstrings: Right *** deg; Left *** deg Thomas test: Right *** deg; Left  *** deg  POSTURE: {posture:25561}  PALPATION: ***  LOWER EXTREMITY ROM:  {AROM/PROM:27142} ROM Right eval Left eval  Hip flexion    Hip extension    Hip abduction    Hip adduction    Hip internal rotation    Hip external rotation    Knee flexion    Knee extension    Ankle dorsiflexion    Ankle plantarflexion    Ankle inversion    Ankle eversion     (Blank rows = not tested)  LOWER EXTREMITY MMT:  MMT Right eval Left eval  Hip flexion    Hip extension    Hip abduction    Hip adduction    Hip internal rotation    Hip external rotation    Knee flexion    Knee extension    Ankle dorsiflexion    Ankle plantarflexion    Ankle inversion    Ankle eversion     (Blank rows = not tested)  LOWER EXTREMITY SPECIAL TESTS:  {LEspecialtests:26242}  FUNCTIONAL TESTS:  {Functional tests:24029}  GAIT: Distance walked: *** Assistive device utilized: {Assistive devices:23999} Level of assistance: {Levels of assistance:24026} Comments: ***   TODAY'S TREATMENT:  DATE: ***    PATIENT EDUCATION:  Education details: *** Person educated: {Person educated:25204} Education method: {Education Method:25205} Education comprehension: {Education Comprehension:25206}  HOME EXERCISE PROGRAM: ***  ASSESSMENT:  CLINICAL IMPRESSION: Patient is a *** y.o. *** who was seen today for physical therapy evaluation and treatment for ***.   OBJECTIVE IMPAIRMENTS: {opptimpairments:25111}.   ACTIVITY LIMITATIONS: {activitylimitations:27494}  PARTICIPATION LIMITATIONS: {participationrestrictions:25113}  PERSONAL FACTORS: {Personal factors:25162} are also affecting patient's functional outcome.   REHAB POTENTIAL: {rehabpotential:25112}  CLINICAL DECISION MAKING: {clinical decision making:25114}  EVALUATION COMPLEXITY: {Evaluation  complexity:25115}   GOALS: Goals reviewed with patient? {yes/no:20286}  SHORT TERM GOALS: Target date: *** *** Baseline: Goal status: INITIAL  2.  *** Baseline:  Goal status: INITIAL  3.  *** Baseline:  Goal status: INITIAL  4.  *** Baseline:  Goal status: INITIAL  5.  *** Baseline:  Goal status: INITIAL  6.  *** Baseline:  Goal status: INITIAL  LONG TERM GOALS: Target date: ***  *** Baseline:  Goal status: INITIAL  2.  *** Baseline:  Goal status: INITIAL  3.  *** Baseline:  Goal status: INITIAL  4.  *** Baseline:  Goal status: INITIAL  5.  *** Baseline:  Goal status: INITIAL  6.  *** Baseline:  Goal status: INITIAL   PLAN:  PT FREQUENCY: {rehab frequency:25116}  PT DURATION: {rehab duration:25117}  PLANNED INTERVENTIONS: {rehab planned interventions:25118::"Therapeutic exercises","Therapeutic activity","Neuromuscular re-education","Balance training","Gait training","Patient/Family education","Self Care","Joint mobilization"}  PLAN FOR NEXT SESSION: ***   Zuri Bradway April Ma L Tamirah George, PT 11/10/2022, 8:00 AM

## 2022-11-17 ENCOUNTER — Ambulatory Visit (HOSPITAL_COMMUNITY)
Admission: EM | Admit: 2022-11-17 | Discharge: 2022-11-17 | Disposition: A | Discharge status: Home / Self Care | Payer: BC Managed Care – PPO | Attending: Emergency Medicine | Admitting: Emergency Medicine

## 2022-11-17 ENCOUNTER — Encounter (HOSPITAL_COMMUNITY): Payer: Self-pay

## 2022-11-17 ENCOUNTER — Encounter: Payer: Self-pay | Admitting: Internal Medicine

## 2022-11-17 ENCOUNTER — Encounter: Payer: Self-pay | Admitting: Family Medicine

## 2022-11-17 DIAGNOSIS — O165 Unspecified maternal hypertension, complicating the puerperium: Secondary | ICD-10-CM

## 2022-11-17 DIAGNOSIS — M25562 Pain in left knee: Secondary | ICD-10-CM | POA: Diagnosis not present

## 2022-11-17 MED ORDER — NIFEDIPINE ER OSMOTIC RELEASE 30 MG PO TB24
30.0000 mg | ORAL_TABLET | Freq: Every day | ORAL | 3 refills | Status: DC
Start: 2022-11-17 — End: 2023-01-20

## 2022-11-17 MED ORDER — MELOXICAM 7.5 MG PO TABS: 7.5000 mg | ORAL_TABLET | Freq: Every evening | ORAL | 0 refills | Status: DC

## 2022-11-17 NOTE — Discharge Instructions (Addendum)
Rest - try to avoid high impact activity Ice - apply for 20 minutes a few times daily Compression - use knee brace as needed with standing/walking Elevation - prop up on a pillow  Take the mobic once every night after dinner. This is an anti-inflammatory similar to ibuprofen that should help with pain. Try for about a week and see how you feel.  Please follow up with orthopedics. [facility] has walk-in hours or you can call to make an appointment.

## 2022-11-17 NOTE — ED Provider Notes (Signed)
[facility]    CSN: [id number] Arrival date & time: 11/17/22  2952     History   Chief Complaint Chief Complaint  Patient presents with   Knee Pain    HPI Shelby Obrien is a 32 y.o. female.  1 week history of left knee pain Denies known injury or trauma, no prior injury Worse with bending, going up/down stairs. Increased pain with prolonged weightbearing. Rates 7/10  Has not taken any medications. Warm shower sometimes helps Denies swelling of legs or ankles  Past Medical History:  Diagnosis Date   Anemia affecting pregnancy in third trimester    Asthma    Migraines    Obesity    BMI>40   Rapid palpitations 03/01/2021   Tachycardia, unspecified     Patient Active Problem List   Diagnosis Date Noted   Postpartum hypertension 09/08/2021   Vaginal delivery 08/05/2021   Symptomatic anemia 07/05/2021   Eczema 06/01/2019   Anxiety and depression 09/04/2017   Maternal obesity affecting pregnancy, antepartum 02/26/2017   Maternal morbid obesity, antepartum (HCC) 02/26/2017   Migraines 10/07/2016   Asthma 10/07/2016    Past Surgical History:  Procedure Laterality Date   WISDOM TOOTH EXTRACTION      OB History     Gravida  4   Para  3   Term  2   Preterm  1   AB  1   Living  3      SAB  1   IAB      Ectopic      Multiple  0   Live Births  3            Home Medications    Prior to Admission medications   Medication Sig Start Date End Date Taking? Authorizing Provider  albuterol (VENTOLIN HFA) 108 (90 Base) MCG/ACT inhaler Inhale 2 puffs into the lungs every 6 (six) hours as needed for wheezing or shortness of breath. 09/08/22  Yes Raspet, Denny Peon K, PA-C  budesonide-formoterol (SYMBICORT) 80-4.5 MCG/ACT inhaler Inhale 2 puffs into the lungs in the morning and at bedtime. 09/08/22  Yes Raspet, Noberto Retort, PA-C  meloxicam (MOBIC) 7.5 MG tablet Take 1 tablet (7.5 mg total) by mouth at bedtime. 11/17/22  Yes Archer Vise, Lurena Joiner, PA-C   NIFEdipine (PROCARDIA-XL/NIFEDICAL-XL) 30 MG 24 hr tablet Take 1 tablet (30 mg total) by mouth daily. 11/05/21  Yes Nelwyn Salisbury, MD  phentermine 37.5 MG capsule Take 1 capsule (37.5 mg total) by mouth every morning. 10/17/22  Yes Nelwyn Salisbury, MD  sertraline (ZOLOFT) 50 MG tablet TAKE 1 TABLET(50 MG) BY MOUTH DAILY 02/12/22  Yes Nelwyn Salisbury, MD    Family History Family History  Problem Relation Age of Onset   Cervical cancer Mother    Diabetes Sister    Diabetes Paternal Uncle    Diabetes Maternal Grandmother    Heart disease Maternal Grandmother    Diabetes Paternal Grandmother    Heart disease Paternal Grandmother     Social History Social History   Tobacco Use   Smoking status: Former    Current packs/day: 0.00    Types: Cigarettes    Quit date: 01/02/2021    Years since quitting: 1.8   Smokeless tobacco: Never  Vaping Use   Vaping status: Never Used  Substance Use Topics   Alcohol use: No   Drug use: No     Allergies   Iodine, Shellfish allergy, Banana, Blueberry flavor, Other, and Raspberry   Review  of Systems Review of Systems Per HPI  Physical Exam Triage Vital Signs ED Triage Vitals  Encounter Vitals Group     BP 11/17/22 0902 137/85     Systolic BP Percentile --      Diastolic BP Percentile --      Pulse Rate 11/17/22 0902 69     Resp 11/17/22 0902 16     Temp 11/17/22 0902 99 F (37.2 C)     Temp Source 11/17/22 0902 Oral     SpO2 11/17/22 0902 97 %     Weight 11/17/22 0901 290 lb (131.5 kg)     Height 11/17/22 0901 5\' 3"  (1.6 m)     Head Circumference --      Peak Flow --      Pain Score 11/17/22 0903 7     Pain Loc --      Pain Education --      Exclude from Growth Chart --    No data found.  Updated Vital Signs BP 137/85 (BP Location: Right Arm)   Pulse 69   Temp 99 F (37.2 C) (Oral)   Resp 16   Ht 5\' 3"  (1.6 m)   Wt 290 lb (131.5 kg)   LMP 11/12/2022 (Exact Date)   SpO2 97%   Breastfeeding No   BMI 51.37 kg/m    Physical Exam Vitals and nursing note reviewed.  Constitutional:      General: She is not in acute distress. HENT:     Mouth/Throat:     Pharynx: Oropharynx is clear.  Cardiovascular:     Rate and Rhythm: Normal rate and regular rhythm.     Pulses: Normal pulses.  Pulmonary:     Effort: Pulmonary effort is normal.  Musculoskeletal:        General: No signs of injury.     Cervical back: Normal range of motion.     Left knee: No deformity, erythema or bony tenderness. Tenderness present. No patellar tendon tenderness. Normal pulse.     Right lower leg: No edema.     Left lower leg: No edema.     Comments: Left knee tender soft tissues medial and lateral. No bony tenderness or crepitus of patella. Full ROM of knee, pain with resisted flexion. No posterior calf pain or swelling. Distal sensation intact. Strong DP pulse  Skin:    Capillary Refill: Capillary refill takes less than 2 seconds.  Neurological:     Mental Status: She is alert and oriented to person, place, and time.     UC Treatments / Results  Labs (all labs ordered are listed, but only abnormal results are displayed) Labs Reviewed - No data to display  EKG  Radiology No results found.  Procedures Procedures   Medications Ordered in UC Medications - No data to display  Initial Impression / Assessment and Plan / UC Course  I have reviewed the triage vital signs and the nursing notes.  Pertinent labs & imaging results that were available during my care of the patient were reviewed by me and considered in my medical decision making (see chart for details).  Suspect soft tissue etiology. At this time low concern for bony abnormality given lack of injury/trauma. Offered xray but discussed will not likely show any abnormality. Discussed orthopedics would be able to perform other imaging if needed. Patient would like to defer xray today and see ortho after attempting RICE therapy at home. Provided with knee brace in  clinic, patient reports knee feels  supported with this on. Provided with two orthopedic clinics. Will also try once nightly mobic for pain and inflammation (reports ibuprofen makes her sleepy). Advised return precautions. Patient agreeable to plan, all questions answered  Final Clinical Impressions(s) / UC Diagnoses   Final diagnoses:  Acute pain of left knee     Discharge Instructions      Rest - try to avoid high impact activity Ice - apply for 20 minutes a few times daily Compression - use knee brace as needed with standing/walking Elevation - prop up on a pillow  Take the mobic once every night after dinner. This is an anti-inflammatory similar to ibuprofen that should help with pain. Try for about a week and see how you feel.  Please follow up with orthopedics. [facility] has walk-in hours or you can call to make an appointment.     ED Prescriptions     Medication Sig Dispense Auth. Provider   meloxicam (MOBIC) 7.5 MG tablet Take 1 tablet (7.5 mg total) by mouth at bedtime. 30 tablet Habiba Treloar, Lurena Joiner, PA-C      PDMP not reviewed this encounter.   Bruce Churilla, Ray Church 11/17/22 1233

## 2022-11-17 NOTE — ED Triage Notes (Signed)
Patient here today with c/o left knee pain X 1 week. Increased pain with weightbearing, bending, straightening, and using stairs. Heat helps. No known injury.

## 2022-11-18 ENCOUNTER — Encounter: Payer: Self-pay | Admitting: Internal Medicine

## 2022-11-20 ENCOUNTER — Encounter: Payer: Self-pay | Admitting: Rehabilitative and Restorative Service Providers"

## 2022-11-20 ENCOUNTER — Ambulatory Visit: Payer: BC Managed Care – PPO | Admitting: Rehabilitative and Restorative Service Providers"

## 2022-11-20 ENCOUNTER — Other Ambulatory Visit: Payer: Self-pay

## 2022-11-20 DIAGNOSIS — M6281 Muscle weakness (generalized): Secondary | ICD-10-CM | POA: Diagnosis not present

## 2022-11-20 DIAGNOSIS — R252 Cramp and spasm: Secondary | ICD-10-CM | POA: Diagnosis not present

## 2022-11-20 DIAGNOSIS — M25561 Pain in right knee: Secondary | ICD-10-CM | POA: Diagnosis not present

## 2022-11-20 DIAGNOSIS — R293 Abnormal posture: Secondary | ICD-10-CM | POA: Diagnosis not present

## 2022-11-20 DIAGNOSIS — M25562 Pain in left knee: Secondary | ICD-10-CM

## 2022-11-20 DIAGNOSIS — R2689 Other abnormalities of gait and mobility: Secondary | ICD-10-CM | POA: Diagnosis not present

## 2022-11-20 NOTE — Therapy (Addendum)
OUTPATIENT PHYSICAL THERAPY LOWER EXTREMITY EVALUATION AND LATE ENTRY DISCHARGE SUMMARY   Patient Name: Shelby Obrien MRN: [id number] DOB:[birth year], 32 y.o., female Today's Date: 11/20/2022  END OF SESSION:  PT End of Session - 11/20/22 1025     Visit Number 1    Date for PT Re-Evaluation 01/16/23    Authorization Type BC/BS    PT Start Time 1017    PT Stop Time 1055    PT Time Calculation (min) 38 min    Activity Tolerance Patient limited by pain    Behavior During Therapy [facility] for tasks assessed/performed             Past Medical History:  Diagnosis Date   Anemia affecting pregnancy in third trimester    Asthma    Migraines    Obesity    BMI>40   Rapid palpitations 03/01/2021   Tachycardia, unspecified    Past Surgical History:  Procedure Laterality Date   WISDOM TOOTH EXTRACTION     Patient Active Problem List   Diagnosis Date Noted   Postpartum hypertension 09/08/2021   Vaginal delivery 08/05/2021   Symptomatic anemia 07/05/2021   Eczema 06/01/2019   Anxiety and depression 09/04/2017   Maternal obesity affecting pregnancy, antepartum 02/26/2017   Maternal morbid obesity, antepartum (HCC) 02/26/2017   Migraines 10/07/2016   Asthma 10/07/2016    PCP: Nelwyn Salisbury, MD  REFERRING PROVIDER: Rodolph Bong, MD  REFERRING DIAG: [phone] (ICD-10-CM) - Right knee pain, unspecified chronicity  THERAPY DIAG:  Pain in both knees, unspecified chronicity  Muscle weakness (generalized)  Cramp and spasm  Abnormal posture  Other abnormalities of gait and mobility  Rationale for Evaluation and Treatment: Rehabilitation  ONSET DATE: "a couple of months"  SUBJECTIVE:   SUBJECTIVE STATEMENT: Pt reports that she has been having pain for a couple of months.  States that the pain started off primarily in her right knee, but after the steroid shot, her knee has been feeling better.  Patient reports that this morning, her left knee is hurting worse and  she presents wearing a brace on her left knee.  PERTINENT HISTORY: Asthma, Migraines, Obesity  PAIN:  Are you having pain? Yes: NPRS scale: Right Knee 0-1/10, Left knee 7/10 Pain location: bilateral knees Pain description: stabbing and pulling Aggravating factors: increased flexion, standing for prolonged periods Relieving factors: rest, heat  PRECAUTIONS: None  RED FLAGS: None   WEIGHT BEARING RESTRICTIONS: No  FALLS:  Has patient fallen in last 6 months? Yes. Number of falls 1 fall down the stairs, but pt does not remember hurting knee at that time  LIVING ENVIRONMENT: Lives with: lives with their family Lives in: House/apartment Stairs: Yes: Internal: 14 steps; on left going up and External: 1 steps; none Has following equipment at home: None  OCCUPATION: Production designer, theatre/television/film at [employer]  PLOF: Independent and Leisure: play with 3 children (ages 57, 5, and 1)  PATIENT GOALS: Pt would like knee to stop hurting to be able to work an 8 hour shift at work and play outside with her children.  NEXT MD VISIT: Dr Denyse Amass on August 30th  OBJECTIVE:   DIAGNOSTIC FINDINGS:  Radiograph of left shoulder and right knee on 10/21/2022: Unremarkable  PATIENT SURVEYS:  FOTO 38 (projected 61 by visit 13)  COGNITION: Overall cognitive status: Within functional limits for tasks assessed     SENSATION: WFL per pt report  MUSCLE LENGTH: Hamstrings: tightness bilat Thomas test: tightness bilat  POSTURE: rounded shoulders and forward  head  PALPATION: Tenderness to palpation along bilateral knees  LOWER EXTREMITY ROM:  Eval: Right knee A/ROM in sitting:  0-100 degrees Left knee A/ROM in sitting:  0-94 degrees  LOWER EXTREMITY MMT:  Eval: Bilateral hip strength 4+ to 5-/5 Right quad strength of 5-/5 Left quad strength of 4+/5 Bilateral hamstring strength of 4+/5  LOWER EXTREMITY SPECIAL TESTS:  Knee special tests: Anterior drawer test: positive  and Posterior drawer test:  negative on left side Right side negative anterior and posterior drawer  FUNCTIONAL TESTS:  5 times sit to stand: 20.89 sec with hands on thighs Timed up and go (TUG): 10.29 sec  GAIT: Distance walked: >200 ft Assistive device utilized: None Level of assistance: Complete Independence Comments: Pt with antalgic gait pattern with decreased right knee flexion noted.   TODAY'S TREATMENT:                                                                                                                              DATE: 11/20/2022 Seated:  heel/toe raises, marching, LAQ.  BLE x10 reps each  Sit to stand x5   PATIENT EDUCATION:  Education details: Issued HEP Person educated: Patient Education method: Explanation, Demonstration, and Handouts Education comprehension: verbalized understanding and returned demonstration  HOME EXERCISE PROGRAM: Access Code: [email or web address] URL: [email or web address] Date: 11/20/2022 Prepared by: Reather Laurence  Exercises - Seated Heel Toe Raises  - 1 x daily - 7 x weekly - 2 sets - 10 reps - Seated Long Arc Quad  - 1 x daily - 7 x weekly - 2 sets - 10 reps - Seated March  - 1 x daily - 7 x weekly - 2 sets - 10 reps - Seated Hamstring Stretch  - 1 x daily - 7 x weekly - 2 reps - 20 sec hold - Sit to Stand  - 1 x daily - 7 x weekly - 2 sets - 5 reps  ASSESSMENT:  CLINICAL IMPRESSION: Patient is a 32 y.o. female who was seen today for physical therapy evaluation and treatment for knee pain.   Pt with referral for right knee pain, but she states that her left knee has actually been hurting her worse in recent days.  Pt reports less pain in her right knee since injection.  Patient presents with difficulty walking, muscle weakness, decreased balance, knee pain with functional tasks.  Patient would benefit from skilled PT to address her functional impairments to allow her to play with her children and work without increased pain.  OBJECTIVE IMPAIRMENTS:  decreased balance, difficulty walking, decreased ROM, decreased strength, increased muscle spasms, impaired flexibility, postural dysfunction, and pain.   ACTIVITY LIMITATIONS: lifting, bending, standing, squatting, and stairs  PARTICIPATION LIMITATIONS: cleaning, community activity, and occupation  PERSONAL FACTORS: Past/current experiences, Time since onset of injury/illness/exacerbation, and 3+ comorbidities: asthma, obesity, HTN  are also affecting patient's functional outcome.   REHAB POTENTIAL: Good  CLINICAL DECISION MAKING: Evolving/moderate complexity  EVALUATION COMPLEXITY: Moderate  GOALS: Goals reviewed with patient? Yes  SHORT TERM GOALS: Target date: 12/05/2022 Pt will be independent with initial HEP. Baseline: Goal status: INITIAL  2.  Pt will increase left knee A/ROM to 0-100 degrees to allow for easier navigation of stairs. Baseline:  Goal status: INITIAL   LONG TERM GOALS: Target date: 01/16/2023  Pt will be independent with advanced HEP to allow for self progression following discharge. Baseline:  Goal status: INITIAL  2.  Pt will increase FOTO to at least 61 to demonstrate improvements in functional mobility. Baseline: 38 Goal status: INITIAL  3.  Pt will increase bilateral LE strength to at least 5-/5 throughout to allow for easier navigation of stairs with reciprocal pattern. Baseline:  Goal status: INITIAL  4.  Pt will report being able to work at least 4 hours at [facility] [employer] [facility] without increased pain. Baseline:  Goal status: INITIAL  5.  Pt will improve 5 times sit to stand to 15 seconds or less to demonstrate improved functional strength. Baseline: 20.89 sec Goal status: INITIAL   PLAN:  PT FREQUENCY: 1-2x/week  PT DURATION: 8 weeks  PLANNED INTERVENTIONS: Therapeutic exercises, Therapeutic activity, Neuromuscular re-education, Balance training, Gait training, Patient/Family education, Self Care, Joint mobilization, Joint manipulation,  Stair training, Aquatic Therapy, Dry Needling, Electrical stimulation, Spinal manipulation, Spinal mobilization, Cryotherapy, Moist heat, Taping, Vasopneumatic device, Traction, Ultrasound, Ionotophoresis 4mg /ml Dexamethasone, Manual therapy, and Re-evaluation  PLAN FOR NEXT SESSION: Assess and progress HEP as indicated, strengthening, flexibility   Reather Laurence, PT 11/20/2022, 12:24 PM   [facility] [address] [place], [place] [address] Phone # [phone] Fax [phone]    PHYSICAL THERAPY DISCHARGE SUMMARY  Visits from Start of Care: 1  Current functional level related to goals / functional outcomes: Unknown, pt has not returned since PT eval   Remaining deficits: Unknown, as of 02/09/2023, pt has not returned for further follow up.   Education / Equipment: Issued HEP at eval.   Patient agrees to discharge. Patient goals were not met. Patient is being discharged due to not returning since the last visit.  Clydie Braun Camielle Sizer, PT, DPT 02/09/23, 11:44 AM

## 2022-11-20 NOTE — Patient Instructions (Signed)
     [facility] Welcome to [facility]! Here you will find all the information you will need regarding your pool therapy. If you have further questions at any time, please call our office at [phone]. After completing your initial evaluation in the [facility], you may be eligible to complete a portion of your therapy in the pool. A typical week of therapy will consist of 1-2 typical physical therapy visits at our [facility] location and an additional session of therapy in the pool located at the [facility] [address]. [address], [place] [address]. The phone number at the pool site is [phone]. Please call this number if you are running late or need to cancel your appointment.  Aquatic therapy will be offered on Wednesday mornings and Friday afternoons. Each session will last approximately 45 minutes. All scheduling and payments for aquatic therapy sessions, including cancelations, will be done through our [facility] location.  To be eligible for aquatic therapy, these criteria must be met: You must be able to independently change in the locker room and get to the pool deck. A caregiver can come with you to help if needed. There are benches for a caregiver to sit on next to the pool. No one with an open wound is permitted in the pool.  Handicap parking is available in the front and there is a drop off option for even closer accessibility. Please arrive 15 minutes prior to your appointment to prepare for your pool session. You must sign in at the front desk upon your arrival. Please be sure to attend to any toileting needs prior to entering the pool. Locker rooms for changing are available.  There is direct access to the pool deck from the locker room. You can lock your belongings in a locker or bring them with you poolside. Your therapist will greet you on the pool deck. There may be other swimmers in the pool at the  same time but your session is one-on-one with the therapist.   

## 2022-11-21 DIAGNOSIS — M25562 Pain in left knee: Secondary | ICD-10-CM | POA: Diagnosis not present

## 2022-11-25 ENCOUNTER — Ambulatory Visit (HOSPITAL_COMMUNITY)
Admission: EM | Admit: 2022-11-25 | Discharge: 2022-11-25 | Disposition: A | Discharge status: Home / Self Care | Payer: BC Managed Care – PPO | Attending: Family Medicine | Admitting: Family Medicine

## 2022-11-25 ENCOUNTER — Encounter (HOSPITAL_COMMUNITY): Payer: Self-pay | Admitting: *Deleted

## 2022-11-25 DIAGNOSIS — M549 Dorsalgia, unspecified: Secondary | ICD-10-CM | POA: Diagnosis not present

## 2022-11-25 MED ORDER — TIZANIDINE HCL 4 MG PO TABS: 4.0000 mg | ORAL_TABLET | Freq: Three times a day (TID) | ORAL | 0 refills | Status: DC | PRN

## 2022-11-25 MED ORDER — KETOROLAC TROMETHAMINE 30 MG/ML IJ SOLN
30.0000 mg | Freq: Once | INTRAMUSCULAR | Status: AC
  Administered 2022-11-25: 30 mg via INTRAMUSCULAR

## 2022-11-25 MED ORDER — KETOROLAC TROMETHAMINE 10 MG PO TABS: 10.0000 mg | ORAL_TABLET | Freq: Four times a day (QID) | ORAL | 0 refills | Status: DC | PRN

## 2022-11-25 MED ORDER — KETOROLAC TROMETHAMINE 30 MG/ML IJ SOLN
INTRAMUSCULAR | Status: AC
  Filled 2022-11-25: qty 1

## 2022-11-25 NOTE — Discharge Instructions (Signed)
You have been given a shot of Toradol 30 mg today.  Ketorolac 10 mg tablets--take 1 tablet every 6 hours as needed for pain.  This is the same medicine that is in the shot we just gave you   Take tizanidine 4 mg--1 every 8 hours as needed for muscle spasms; this medication can cause dizziness and sleepiness  Heating pad to the area can help the muscle soreness

## 2022-11-25 NOTE — ED Triage Notes (Signed)
Pt states a could weeks ago she was playing with the kids and hurt her left shoulder. Since the pain has increased and now radiates to her back. She has been using heat but no meds.    She had an rx for mobic but never picked it up fro her knee pain at last visit.

## 2022-11-25 NOTE — ED Provider Notes (Signed)
[facility]    CSN: [id number] Arrival date & time: 11/25/22  1057      History   Chief Complaint Chief Complaint  Patient presents with   Shoulder Pain    HPI Shelby Obrien is a 32 y.o. female.    Shoulder Pain Here for about 2 weeks history of left posterior shoulder pain.  It first began bothering her after she had been playing around with the kids.  No actual trauma or fall.  It is increased and now she is hurting in her posterior left shoulder and it radiates across to her upper thoracic spine.  No fever or rash or cough.  It hurts to turn her head or raise her left arm.  Last menstrual cycle was July 17.  Past Medical History:  Diagnosis Date   Anemia affecting pregnancy in third trimester    Asthma    Migraines    Obesity    BMI>40   Rapid palpitations 03/01/2021   Tachycardia, unspecified     Patient Active Problem List   Diagnosis Date Noted   Postpartum hypertension 09/08/2021   Vaginal delivery 08/05/2021   Symptomatic anemia 07/05/2021   Eczema 06/01/2019   Anxiety and depression 09/04/2017   Maternal obesity affecting pregnancy, antepartum 02/26/2017   Maternal morbid obesity, antepartum (HCC) 02/26/2017   Migraines 10/07/2016   Asthma 10/07/2016    Past Surgical History:  Procedure Laterality Date   WISDOM TOOTH EXTRACTION      OB History     Gravida  4   Para  3   Term  2   Preterm  1   AB  1   Living  3      SAB  1   IAB      Ectopic      Multiple  0   Live Births  3            Home Medications    Prior to Admission medications   Medication Sig Start Date End Date Taking? Authorizing Provider  ketorolac (TORADOL) 10 MG tablet Take 1 tablet (10 mg total) by mouth every 6 (six) hours as needed (pain). 11/25/22  Yes Zenia Resides, MD  NIFEdipine (PROCARDIA-XL/NIFEDICAL-XL) 30 MG 24 hr tablet Take 1 tablet (30 mg total) by mouth daily. 11/17/22  Yes Nelwyn Salisbury, MD  phentermine 37.5 MG  capsule Take 1 capsule (37.5 mg total) by mouth every morning. 10/17/22  Yes Nelwyn Salisbury, MD  sertraline (ZOLOFT) 50 MG tablet TAKE 1 TABLET(50 MG) BY MOUTH DAILY 02/12/22  Yes Nelwyn Salisbury, MD  tiZANidine (ZANAFLEX) 4 MG tablet Take 1 tablet (4 mg total) by mouth every 8 (eight) hours as needed for muscle spasms. 11/25/22  Yes Zenia Resides, MD  albuterol (VENTOLIN HFA) 108 (90 Base) MCG/ACT inhaler Inhale 2 puffs into the lungs every 6 (six) hours as needed for wheezing or shortness of breath. 09/08/22   Raspet, Noberto Retort, PA-C  budesonide-formoterol (SYMBICORT) 80-4.5 MCG/ACT inhaler Inhale 2 puffs into the lungs in the morning and at bedtime. 09/08/22   Raspet, Noberto Retort, PA-C  levonorgestrel (MIRENA) 20 MCG/DAY IUD 1 each by Intrauterine route once.    [provider]    Family History Family History  Problem Relation Age of Onset   Cervical cancer Mother    Diabetes Sister    Diabetes Paternal Uncle    Diabetes Maternal Grandmother    Heart disease Maternal Grandmother    Diabetes Paternal  Grandmother    Heart disease Paternal Grandmother     Social History Social History   Tobacco Use   Smoking status: Former    Current packs/day: 0.00    Types: Cigarettes    Quit date: 01/02/2021    Years since quitting: 1.8   Smokeless tobacco: Never  Vaping Use   Vaping status: Never Used  Substance Use Topics   Alcohol use: No   Drug use: No     Allergies   Iodine, Shellfish allergy, Banana, Blueberry flavor, Other, and Raspberry   Review of Systems Review of Systems   Physical Exam Triage Vital Signs ED Triage Vitals  Encounter Vitals Group     BP 11/25/22 1127 (!) 137/99     Systolic BP Percentile --      Diastolic BP Percentile --      Pulse Rate 11/25/22 1127 68     Resp 11/25/22 1127 18     Temp 11/25/22 1127 98.1 F (36.7 C)     Temp Source 11/25/22 1127 Oral     SpO2 11/25/22 1127 96 %     Weight --      Height --      Head Circumference --       Peak Flow --      Pain Score 11/25/22 1125 10     Pain Loc --      Pain Education --      Exclude from Growth Chart --    No data found.  Updated Vital Signs BP (!) 137/99 (BP Location: Left Arm)   Pulse 68   Temp 98.1 F (36.7 C) (Oral)   Resp 18   LMP 11/12/2022 (Exact Date)   SpO2 96%   Visual Acuity Right Eye Distance:   Left Eye Distance:   Bilateral Distance:    Right Eye Near:   Left Eye Near:    Bilateral Near:     Physical Exam Vitals reviewed.  Constitutional:      General: She is not in acute distress.    Appearance: She is not ill-appearing, toxic-appearing or diaphoretic.  HENT:     Mouth/Throat:     Mouth: Mucous membranes are moist.  Eyes:     Extraocular Movements: Extraocular movements intact.     Pupils: Pupils are equal, round, and reactive to light.  Cardiovascular:     Rate and Rhythm: Normal rate and regular rhythm.     Heart sounds: No murmur heard. Pulmonary:     Effort: No respiratory distress.     Breath sounds: No stridor. No wheezing, rhonchi or rales.  Musculoskeletal:     Cervical back: Neck supple.     Comments: There is muscular spasm and tenderness of the left upper trapezius extending from the posterior left shoulder over toward the thoracic spine.  There is no erythema or rash or deformity.  Range of motion of the left shoulder is limited due to pain on abduction.  Lymphadenopathy:     Cervical: No cervical adenopathy.  Skin:    Coloration: Skin is not pale.  Neurological:     General: No focal deficit present.     Mental Status: She is alert and oriented to person, place, and time.  Psychiatric:        Behavior: Behavior normal.      UC Treatments / Results  Labs (all labs ordered are listed, but only abnormal results are displayed) Labs Reviewed - No data to display  EKG   Radiology  No results found.  Procedures Procedures (including critical care time)  Medications Ordered in UC Medications  ketorolac  (TORADOL) 30 MG/ML injection 30 mg (has no administration in time range)    Initial Impression / Assessment and Plan / UC Course  I have reviewed the triage vital signs and the nursing notes.  Pertinent labs & imaging results that were available during my care of the patient were reviewed by me and considered in my medical decision making (see chart for details).        Toradol injection is given here and Toradol tablets are sent to the pharmacy along with tizanidine as a muscle relaxer.  I have asked her to follow-up with her primary care Final Clinical Impressions(s) / UC Diagnoses   Final diagnoses:  Upper back pain on left side     Discharge Instructions      You have been given a shot of Toradol 30 mg today.  Ketorolac 10 mg tablets--take 1 tablet every 6 hours as needed for pain.  This is the same medicine that is in the shot we just gave you   Take tizanidine 4 mg--1 every 8 hours as needed for muscle spasms; this medication can cause dizziness and sleepiness  Heating pad to the area can help the muscle soreness     ED Prescriptions     Medication Sig Dispense Auth. Provider   ketorolac (TORADOL) 10 MG tablet Take 1 tablet (10 mg total) by mouth every 6 (six) hours as needed (pain). 20 tablet Maud Rubendall, Janace Aris, MD   tiZANidine (ZANAFLEX) 4 MG tablet Take 1 tablet (4 mg total) by mouth every 8 (eight) hours as needed for muscle spasms. 15 tablet Kobi Mario, Janace Aris, MD      PDMP not reviewed this encounter.   Zenia Resides, MD 11/25/22 1140

## 2022-12-03 ENCOUNTER — Ambulatory Visit: Payer: BC Managed Care – PPO | Admitting: Physical Therapy

## 2022-12-10 ENCOUNTER — Ambulatory Visit: Payer: BC Managed Care – PPO | Admitting: Rehabilitative and Restorative Service Providers"

## 2022-12-18 ENCOUNTER — Telehealth (HOSPITAL_BASED_OUTPATIENT_CLINIC_OR_DEPARTMENT_OTHER): Payer: Self-pay | Admitting: Physical Therapy

## 2022-12-18 ENCOUNTER — Ambulatory Visit (HOSPITAL_BASED_OUTPATIENT_CLINIC_OR_DEPARTMENT_OTHER): Payer: BC Managed Care – PPO | Attending: Physical Therapy | Admitting: Physical Therapy

## 2022-12-18 NOTE — Telephone Encounter (Signed)
Patient did not show for aquatic PT appointment.  Called number listed in her chart; no answer and voicemail box full.  Unable to leave message regarding missed appointment and no-show cancellation policy.    Policy states that if you late-cancel (less than 24 hr notice) and/or no show on more than 3 occasions, this will be grounds for discharge.     Mayer Camel, PTA 12/18/22 8:45 AM [facility] [address] [place], [place], [address] Phone: [phone]   Fax:  [phone]

## 2022-12-23 ENCOUNTER — Ambulatory Visit: Payer: BC Managed Care – PPO | Admitting: Dietician

## 2022-12-23 ENCOUNTER — Ambulatory Visit: Payer: BC Managed Care – PPO

## 2022-12-24 ENCOUNTER — Ambulatory Visit (INDEPENDENT_AMBULATORY_CARE_PROVIDER_SITE_OTHER): Payer: BC Managed Care – PPO

## 2022-12-24 ENCOUNTER — Ambulatory Visit (HOSPITAL_COMMUNITY)
Admission: EM | Admit: 2022-12-24 | Discharge: 2022-12-24 | Disposition: A | Discharge status: Home / Self Care | Payer: BC Managed Care – PPO | Attending: Urgent Care | Admitting: Urgent Care

## 2022-12-24 ENCOUNTER — Encounter: Payer: Self-pay | Admitting: Internal Medicine

## 2022-12-24 ENCOUNTER — Encounter (HOSPITAL_COMMUNITY): Payer: Self-pay

## 2022-12-24 DIAGNOSIS — M5412 Radiculopathy, cervical region: Secondary | ICD-10-CM | POA: Diagnosis not present

## 2022-12-24 DIAGNOSIS — M7918 Myalgia, other site: Secondary | ICD-10-CM | POA: Diagnosis not present

## 2022-12-24 DIAGNOSIS — M542 Cervicalgia: Secondary | ICD-10-CM | POA: Diagnosis not present

## 2022-12-24 DIAGNOSIS — M792 Neuralgia and neuritis, unspecified: Secondary | ICD-10-CM | POA: Diagnosis not present

## 2022-12-24 DIAGNOSIS — M25512 Pain in left shoulder: Secondary | ICD-10-CM

## 2022-12-24 DIAGNOSIS — M25511 Pain in right shoulder: Secondary | ICD-10-CM | POA: Diagnosis not present

## 2022-12-24 DIAGNOSIS — R200 Anesthesia of skin: Secondary | ICD-10-CM | POA: Diagnosis not present

## 2022-12-24 MED ORDER — DICLOFENAC SODIUM 75 MG PO TBEC: 75.0000 mg | DELAYED_RELEASE_TABLET | Freq: Two times a day (BID) | ORAL | 0 refills | Status: AC

## 2022-12-24 MED ORDER — CARISOPRODOL 350 MG PO TABS: 350.0000 mg | ORAL_TABLET | Freq: Three times a day (TID) | ORAL | 0 refills | Status: DC

## 2022-12-24 NOTE — ED Provider Notes (Signed)
[facility]    CSN: [id number] Arrival date & time: 12/24/22  1500      History   Chief Complaint Chief Complaint  Patient presents with   Torticollis    HPI Shelby Obrien is a 32 y.o. female.   Pleasant 32 year old female presents today due to concerns of continued neck shoulder and back symptoms.  She was seen last on July 30 for the same, but states symptoms have progressively worsened.  She has been having some neck issues since January, had an x-ray back in January showing loss of normal cervical lordosis and anterior osteophytes.  In July, patient was given an injection of ketorolac, sent home on p.o. Toradol, and tizanidine.  She does not recall this treatment working at all.  Earlier this year, she admits to going to physical therapy and had some relief with dry needling.  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 Has an ill-fitting bra. She has also been using moist heat and her husband's massage, which helps minimally.  States that yesterday, she started having a severe headache and also felt she was having left sided UE radicular symptoms.  They are improved today.  She took Tylenol which resolved the headache.  Patient is right-handed.     Past Medical History:  Diagnosis Date   Anemia affecting pregnancy in third trimester    Asthma    Migraines    Obesity    BMI>40   Rapid palpitations 03/01/2021   Tachycardia, unspecified     Patient Active Problem List   Diagnosis Date Noted   Postpartum hypertension 09/08/2021   Vaginal delivery 08/05/2021   Symptomatic anemia 07/05/2021   Eczema 06/01/2019   Anxiety and depression 09/04/2017   Maternal obesity affecting pregnancy, antepartum 02/26/2017   Maternal morbid obesity, antepartum (HCC) 02/26/2017   Migraines 10/07/2016   Asthma 10/07/2016    Past Surgical History:  Procedure Laterality Date   WISDOM TOOTH EXTRACTION       OB History     Gravida  4   Para  3   Term  2   Preterm  1   AB  1   Living  3      SAB  1   IAB      Ectopic      Multiple  0   Live Births  3            Home Medications    Prior to Admission medications   Medication Sig Start Date End Date Taking? Authorizing Provider  carisoprodol (SOMA) 350 MG tablet Take 1 tablet (350 mg total) by mouth 3 (three) times daily. 12/24/22  Yes Perpetua Elling L, PA  diclofenac (VOLTAREN) 75 MG EC tablet Take 1 tablet (75 mg total) by mouth 2 (two) times daily with a meal for 7 days. 12/24/22 12/31/22 Yes Tomie Elko L, PA  albuterol (VENTOLIN HFA) 108 (90 Base) MCG/ACT inhaler Inhale 2 puffs into the lungs every 6 (six) hours as needed for wheezing or shortness of breath. 09/08/22   Raspet, Noberto Retort, PA-C  budesonide-formoterol (SYMBICORT) 80-4.5 MCG/ACT inhaler Inhale 2 puffs into the lungs in the morning and at bedtime. 09/08/22   Raspet, Noberto Retort, PA-C  levonorgestrel (MIRENA) 20 MCG/DAY IUD 1 each by Intrauterine route once.    [provider]  NIFEdipine (PROCARDIA-XL/NIFEDICAL-XL) 30 MG 24 hr tablet Take 1 tablet (30 mg total) by mouth  daily. 11/17/22   Nelwyn Salisbury, MD  phentermine 37.5 MG capsule Take 1 capsule (37.5 mg total) by mouth every morning. 10/17/22   Nelwyn Salisbury, MD  sertraline (ZOLOFT) 50 MG tablet TAKE 1 TABLET(50 MG) BY MOUTH DAILY 02/12/22   Nelwyn Salisbury, MD    Family History Family History  Problem Relation Age of Onset   Cervical cancer Mother    Diabetes Sister    Diabetes Paternal Uncle    Diabetes Maternal Grandmother    Heart disease Maternal Grandmother    Diabetes Paternal Grandmother    Heart disease Paternal Grandmother     Social History Social History   Tobacco Use   Smoking status: Former    Current packs/day: 0.00    Types: Cigarettes    Quit date: 01/02/2021    Years since quitting: 1.9   Smokeless tobacco: Never  Vaping Use   Vaping status: Never Used  Substance  Use Topics   Alcohol use: No   Drug use: No     Allergies   Iodine, Shellfish allergy, Banana, Blueberry flavor, Other, and Raspberry   Review of Systems Review of Systems As per HPI  Physical Exam Triage Vital Signs ED Triage Vitals  Encounter Vitals Group     BP 12/24/22 1651 (!) 145/95     Systolic BP Percentile --      Diastolic BP Percentile --      Pulse Rate 12/24/22 1651 (!) 55     Resp 12/24/22 1651 16     Temp 12/24/22 1651 98.7 F (37.1 C)     Temp Source 12/24/22 1651 Oral     SpO2 12/24/22 1651 98 %     Weight 12/24/22 1651 295 lb (133.8 kg)     Height 12/24/22 1651 5\' 4"  (1.626 m)     Head Circumference --      Peak Flow --      Pain Score 12/24/22 1648 9     Pain Loc --      Pain Education --      Exclude from Growth Chart --    No data found.  Updated Vital Signs BP (!) 145/95 (BP Location: Right Arm)   Pulse (!) 55   Temp 98.7 F (37.1 C) (Oral)   Resp 16   Ht 5\' 4"  (1.626 m)   Wt 295 lb (133.8 kg)   LMP 11/25/2022 (Approximate)   SpO2 98%   Breastfeeding No   BMI 50.64 kg/m   Visual Acuity Right Eye Distance:   Left Eye Distance:   Bilateral Distance:    Right Eye Near:   Left Eye Near:    Bilateral Near:     Physical Exam Vitals and nursing note reviewed. Exam conducted with a chaperone present.  Constitutional:      General: She is not in acute distress.    Appearance: Normal appearance. She is well-developed. She is obese. She is not ill-appearing, toxic-appearing or diaphoretic.  HENT:     Head: Normocephalic and atraumatic.     Mouth/Throat:     Mouth: Mucous membranes are moist.  Eyes:     General: No scleral icterus.       Right eye: No discharge.        Left eye: No discharge.     Extraocular Movements: Extraocular movements intact.     Conjunctiva/sclera: Conjunctivae normal.     Pupils: Pupils are equal, round, and reactive to light.  Cardiovascular:     Rate  and Rhythm: Regular rhythm. Bradycardia present.      Heart sounds: No murmur heard. Pulmonary:     Effort: Pulmonary effort is normal. No respiratory distress.     Breath sounds: Normal breath sounds.  Abdominal:     Palpations: Abdomen is soft.  Musculoskeletal:        General: No swelling.     Cervical back: Neck supple. Tenderness present. No swelling, erythema, rigidity or torticollis. Pain with movement (in shoulders) present. Decreased range of motion (to lateral rotation).     Thoracic back: Tenderness (R lower thoracic region) present. No swelling or bony tenderness. Normal range of motion.     Lumbar back: Normal.       Back:  Lymphadenopathy:     Cervical: No cervical adenopathy.  Skin:    General: Skin is warm and dry.     Capillary Refill: Capillary refill takes less than 2 seconds.     Coloration: Skin is not jaundiced.     Findings: No bruising, erythema or rash.  Neurological:     General: No focal deficit present.     Mental Status: She is alert and oriented to person, place, and time.     Cranial Nerves: No cranial nerve deficit.     Sensory: No sensory deficit.     Motor: Weakness (4/5 strength noted to entire LUE) present.     Coordination: Coordination normal.     Gait: Gait normal.  Psychiatric:        Mood and Affect: Mood normal.        Behavior: Behavior normal.      UC Treatments / Results  Labs (all labs ordered are listed, but only abnormal results are displayed) Labs Reviewed - No data to display  EKG   Radiology DG Cervical Spine Complete  Result Date: 12/24/2022 CLINICAL DATA:  Cervical radiculopathy. Left-sided neck pain and stiffness radiating to the left shoulder for 2 days. Pain is also now extending to the right shoulder. Numbness and tingling in the left arm. EXAM: CERVICAL SPINE - COMPLETE 4+ VIEW COMPARISON:  Cervical spine radiographs 05/27/2022 FINDINGS: There is chronic straightening of the normal cervical lordosis without listhesis. No fracture is identified. Intervertebral disc  space heights are preserved. The prevertebral soft tissues are within normal limits. IMPRESSION: No acute osseous abnormality or significant degenerative changes. Electronically Signed   By: Sebastian Ache M.D.   On: 12/24/2022 17:53    Procedures Procedures (including critical care time)  Medications Ordered in UC Medications - No data to display  Initial Impression / Assessment and Plan / UC Course  I have reviewed the triage vital signs and the nursing notes.  Pertinent labs & imaging results that were available during my care of the patient were reviewed by me and considered in my medical decision making (see chart for details).     Trigger point of L shoulder region - discussed findings with pt. She has had the most relief with PT and dry needling in the past. Will have pt follow up with PCP to get new referral for continued therapy. I offered patient a trigger point injection here today, which pt declined. Recommended ischemic release massage and moist heat. Will also start carisoprodol to see if this will give some relief. Side effects reviewed. Myofascial pain - as noted above in various locations. We discussed her posture at work possibly contributing to this. Also recommended pt wear a wide strap bra as it appears to be digging into her  L shoulder. Trial of diclofenac and soma; side effects discussed. Pt with no contraindications to NSAID use Radicular pain - no concerning findings on xray to suspect cervical spine issue. Likely all inflammatory/ compressive from L shoulder trigger point. Tx as above.    Final Clinical Impressions(s) / UC Diagnoses   Final diagnoses:  Trigger point of left shoulder region  Myofascial pain  Radicular pain of left upper extremity     Discharge Instructions      You have cervical trigger point, which is knots in the muscles of the neck/ shoulder. Please apply a warm moist compress, such as a microwavable heating pack, to your neck several times  daily. After each warm compress, apply the technique that we discussed today of ischemic release. This is a prolonged, deep pressure into the knot of the muscle to release the tension.  Take the muscle relaxer three times daily as needed. Keep in mind it may make you feel tired or drowsy, so do not operate machinery or drive a car until you know how it affects you.  Please take the anti-inflammatory medication called in today twice daily with food. Do not take any additional OTC NSAIDS (advil, motrin, ibuprofen, aleve, naproxen).    If your symptoms persist, you would be a candidate for trigger point injection or dry needling. Please call physical therapy to schedule appointment.  Try to stay hydrated with WATER as dehydration and caffeine intake can worsen this condition.  If you develop worsening pain, fever, or any new symptoms, please return to the clinic.       ED Prescriptions     Medication Sig Dispense Auth. Provider   carisoprodol (SOMA) 350 MG tablet Take 1 tablet (350 mg total) by mouth 3 (three) times daily. 30 tablet Donald Memoli L, PA   diclofenac (VOLTAREN) 75 MG EC tablet Take 1 tablet (75 mg total) by mouth 2 (two) times daily with a meal for 7 days. 14 tablet Jaydian Santana L, [place]      I have reviewed the PDMP during this encounter.   Maretta Bees, [place] 12/24/22 2147

## 2022-12-24 NOTE — Discharge Instructions (Addendum)
You have cervical trigger point, which is knots in the muscles of the neck/ shoulder. Please apply a warm moist compress, such as a microwavable heating pack, to your neck several times daily. After each warm compress, apply the technique that we discussed today of ischemic release. This is a prolonged, deep pressure into the knot of the muscle to release the tension.  Take the muscle relaxer three times daily as needed. Keep in mind it may make you feel tired or drowsy, so do not operate machinery or drive a car until you know how it affects you.  Please take the anti-inflammatory medication called in today twice daily with food. Do not take any additional OTC NSAIDS (advil, motrin, ibuprofen, aleve, naproxen).    If your symptoms persist, you would be a candidate for trigger point injection or dry needling. Please call physical therapy to schedule appointment.  Try to stay hydrated with WATER as dehydration and caffeine intake can worsen this condition.  If you develop worsening pain, fever, or any new symptoms, please return to the clinic.

## 2022-12-24 NOTE — ED Triage Notes (Signed)
Patient here today with c/o Left side neck pain and stiffness that radiates does to left shoulder X 2 days. The pain has also started radiating to the posterior right shoulder. She started having numbness and tingling in her left arm yesterday morning. Pain is constant. She had a headache yesterday. She took Tylenol which relieved the headache.

## 2022-12-26 ENCOUNTER — Ambulatory Visit: Payer: BC Managed Care – PPO | Admitting: Family Medicine

## 2022-12-26 ENCOUNTER — Encounter: Payer: BC Managed Care – PPO | Admitting: Physical Therapy

## 2023-01-02 ENCOUNTER — Ambulatory Visit (HOSPITAL_BASED_OUTPATIENT_CLINIC_OR_DEPARTMENT_OTHER): Payer: BC Managed Care – PPO | Admitting: Physical Therapy

## 2023-01-07 ENCOUNTER — Encounter: Payer: BC Managed Care – PPO | Admitting: Physical Therapy

## 2023-01-20 ENCOUNTER — Encounter: Payer: Self-pay | Admitting: Family Medicine

## 2023-01-20 ENCOUNTER — Ambulatory Visit (INDEPENDENT_AMBULATORY_CARE_PROVIDER_SITE_OTHER): Payer: BC Managed Care – PPO | Admitting: Family Medicine

## 2023-01-20 ENCOUNTER — Encounter: Payer: BC Managed Care – PPO | Admitting: Family Medicine

## 2023-01-20 VITALS — BP 130/90 | HR 75 | Temp 98.6°F | Ht 64.0 in | Wt 294.0 lb

## 2023-01-20 DIAGNOSIS — Z0000 Encounter for general adult medical examination without abnormal findings: Secondary | ICD-10-CM

## 2023-01-20 MED ORDER — KETOCONAZOLE 2 % EX CREA: 1.0000 | TOPICAL_CREAM | Freq: Two times a day (BID) | CUTANEOUS | 5 refills | Status: DC | PRN

## 2023-01-20 MED ORDER — NIFEDIPINE ER OSMOTIC RELEASE 60 MG PO TB24: 60.0000 mg | ORAL_TABLET | Freq: Every day | ORAL | 3 refills | Status: DC

## 2023-01-20 NOTE — Progress Notes (Signed)
Subjective:    Patient ID: Shelby Obrien, female    DOB: 12-05-1990, 32 y.o.   MRN: [id number]  HPI Here for a well exam. She feels well in general. Her BP at home has been hovering around 140/90. She also has an area of skin breakdown that itches on her left foot. This started about 4 weeks ago.    Review of Systems  Constitutional: Negative.   HENT: Negative.    Eyes: Negative.   Respiratory: Negative.    Cardiovascular: Negative.   Gastrointestinal: Negative.   Genitourinary:  Negative for decreased urine volume, difficulty urinating, dyspareunia, dysuria, enuresis, flank pain, frequency, hematuria, pelvic pain and urgency.  Musculoskeletal: Negative.   Skin:  Positive for rash.  Neurological: Negative.  Negative for headaches.  Psychiatric/Behavioral: Negative.         Objective:   Physical Exam Constitutional:      General: She is not in acute distress.    Appearance: She is well-developed. She is obese.  HENT:     Head: Normocephalic and atraumatic.     Right Ear: External ear normal.     Left Ear: External ear normal.     Nose: Nose normal.     Mouth/Throat:     Pharynx: No oropharyngeal exudate.  Eyes:     General: No scleral icterus.    Conjunctiva/sclera: Conjunctivae normal.     Pupils: Pupils are equal, round, and reactive to light.  Neck:     Thyroid: No thyromegaly.     Vascular: No JVD.  Cardiovascular:     Rate and Rhythm: Normal rate and regular rhythm.     Pulses: Normal pulses.     Heart sounds: Normal heart sounds. No murmur heard.    No friction rub. No gallop.  Pulmonary:     Effort: Pulmonary effort is normal. No respiratory distress.     Breath sounds: Normal breath sounds. No wheezing or rales.  Chest:     Chest wall: No tenderness.  Abdominal:     General: Bowel sounds are normal. There is no distension.     Palpations: Abdomen is soft. There is no mass.     Tenderness: There is no abdominal tenderness. There is no guarding  or rebound.  Musculoskeletal:        General: No tenderness. Normal range of motion.     Cervical back: Normal range of motion and neck supple.  Lymphadenopathy:     Cervical: No cervical adenopathy.  Skin:    General: Skin is warm and dry.     Comments: There is peeling of skin along the lateral and medial edges of her left foot, and there is an area of maceration between the great and second toes of this foot   Neurological:     General: No focal deficit present.     Mental Status: She is alert and oriented to person, place, and time.     Cranial Nerves: No cranial nerve deficit.     Motor: No abnormal muscle tone.     Coordination: Coordination normal.     Deep Tendon Reflexes: Reflexes are normal and symmetric. Reflexes normal.  Psychiatric:        Mood and Affect: Mood normal.        Behavior: Behavior normal.        Thought Content: Thought content normal.        Judgment: Judgment normal.           Assessment & Plan:  Well exam. We discussed diet and exercise. Get fasting labs. For the HTN, we will increase the Nifedipine XR to 60 mg daily. For the tinea pedis, she will apply Ketoconazole cream BID as needed.  Gershon Crane, MD

## 2023-01-26 ENCOUNTER — Other Ambulatory Visit (INDEPENDENT_AMBULATORY_CARE_PROVIDER_SITE_OTHER): Payer: BC Managed Care – PPO

## 2023-01-26 DIAGNOSIS — Z0000 Encounter for general adult medical examination without abnormal findings: Secondary | ICD-10-CM

## 2023-01-26 LAB — CBC WITH DIFFERENTIAL/PLATELET
Basophils Absolute: 0 10*3/uL (ref 0.0–0.1)
Basophils Relative: 0.3 % (ref 0.0–3.0)
Eosinophils Absolute: 0.1 10*3/uL (ref 0.0–0.7)
Eosinophils Relative: 1.8 % (ref 0.0–5.0)
HCT: 38.8 % (ref 36.0–46.0)
Hemoglobin: 12.9 g/dL (ref 12.0–15.0)
Lymphocytes Relative: 20.1 % (ref 12.0–46.0)
Lymphs Abs: 1.5 10*3/uL (ref 0.7–4.0)
MCHC: 33.1 g/dL (ref 30.0–36.0)
MCV: 88.4 fL (ref 78.0–100.0)
Monocytes Absolute: 0.4 10*3/uL (ref 0.1–1.0)
Monocytes Relative: 4.9 % (ref 3.0–12.0)
Neutro Abs: 5.3 10*3/uL (ref 1.4–7.7)
Neutrophils Relative %: 72.9 % (ref 43.0–77.0)
Platelets: 254 10*3/uL (ref 150.0–400.0)
RBC: 4.39 Mil/uL (ref 3.87–5.11)
RDW: 15.4 % (ref 11.5–15.5)
WBC: 7.3 10*3/uL (ref 4.0–10.5)

## 2023-01-26 LAB — LIPID PANEL
Cholesterol: 158 mg/dL (ref 0–200)
HDL: 47.1 mg/dL (ref >39.00)
LDL Cholesterol: 99 mg/dL (ref 0–99)
NonHDL: 110.56
Total CHOL/HDL Ratio: 3
Triglycerides: 58 mg/dL (ref 0.0–149.0)
VLDL: 11.6 mg/dL (ref 0.0–40.0)

## 2023-01-26 LAB — TSH: TSH: 1.04 u[IU]/mL (ref 0.35–5.50)

## 2023-01-26 LAB — BASIC METABOLIC PANEL
BUN: 12 mg/dL (ref 6–23)
CO2: 23 meq/L (ref 19–32)
Calcium: 8.6 mg/dL (ref 8.4–10.5)
Chloride: 105 meq/L (ref 96–112)
Creatinine, Ser: 0.67 mg/dL (ref 0.40–1.20)
GFR: 114.62 mL/min (ref >60.00)
Glucose, Bld: 96 mg/dL (ref 70–99)
Potassium: 3.8 meq/L (ref 3.5–5.1)
Sodium: 137 meq/L (ref 135–145)

## 2023-01-26 LAB — HEPATIC FUNCTION PANEL
ALT: 9 U/L (ref 0–35)
AST: 14 U/L (ref 0–37)
Albumin: 3.8 g/dL (ref 3.5–5.2)
Alkaline Phosphatase: 92 U/L (ref 39–117)
Bilirubin, Direct: 0.1 mg/dL (ref 0.0–0.3)
Total Bilirubin: 0.8 mg/dL (ref 0.2–1.2)
Total Protein: 6.5 g/dL (ref 6.0–8.3)

## 2023-01-26 LAB — HEMOGLOBIN A1C: Hgb A1c MFr Bld: 5.5 % (ref 4.6–6.5)

## 2023-02-03 NOTE — Progress Notes (Incomplete)
ACUTE VISIT No chief complaint on file.  HPI: [name] is a 32 y.o. female with a PMHx significant for postpartum hypertension, asthma, eczema, anxiety/depression, and anemia who is here today complaining of left foot pain.   HPI  Review of Systems See other pertinent positives and negatives in HPI.  Current Outpatient Medications on File Prior to Visit  Medication Sig Dispense Refill   albuterol (VENTOLIN HFA) 108 (90 Base) MCG/ACT inhaler Inhale 2 puffs into the lungs every 6 (six) hours as needed for wheezing or shortness of breath. 8 g 1   budesonide-formoterol (SYMBICORT) 80-4.5 MCG/ACT inhaler Inhale 2 puffs into the lungs in the morning and at bedtime. 1 each 1   carisoprodol (SOMA) 350 MG tablet Take 1 tablet (350 mg total) by mouth 3 (three) times daily. 30 tablet 0   ketoconazole (NIZORAL) 2 % cream Apply 1 Application topically 2 (two) times daily as needed for irritation. 30 g 5   levonorgestrel (MIRENA) 20 MCG/DAY IUD 1 each by Intrauterine route once.     NIFEdipine (PROCARDIA XL/NIFEDICAL XL) 60 MG 24 hr tablet Take 1 tablet (60 mg total) by mouth daily. 90 tablet 3   phentermine 37.5 MG capsule Take 1 capsule (37.5 mg total) by mouth every morning. 90 capsule 1   sertraline (ZOLOFT) 50 MG tablet TAKE 1 TABLET(50 MG) BY MOUTH DAILY 90 tablet 3   No current facility-administered medications on file prior to visit.    Past Medical History:  Diagnosis Date   Anemia affecting pregnancy in third trimester    Asthma    Migraines    Obesity    BMI>40   Rapid palpitations 03/01/2021   Tachycardia, unspecified    Allergies  Allergen Reactions   Iodine Anaphylaxis    Reports swelling and had to get epipen after iodine exposure   Shellfish Allergy Anaphylaxis   Banana    Blueberry Flavor    Other     BLUEBERRIES, BANANAS, TREE NUTS, RASPBERRIES, GRAPES, POLLENS/SEASONAL.   Raspberry     Social History   Socioeconomic History   Marital status:  Single    Spouse name: Not on file   Number of children: 2   Years of education: 16   Highest education level: Associate degree: academic program  Occupational History   Occupation: Multimedia programmer: [employer]  Tobacco Use   Smoking status: Former    Current packs/day: 0.00    Types: Cigarettes    Quit date: 01/02/2021    Years since quitting: 2.0   Smokeless tobacco: Never  Vaping Use   Vaping status: Never Used  Substance and Sexual Activity   Alcohol use: No   Drug use: No   Sexual activity: Not Currently    Birth control/protection: I.U.D.  Other Topics Concern   Not on file  Social History Narrative   Lives with boyfriend and 2 daughters in an apartment on the 2nd floor.  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  Education: college. She is right-handed. Caffeine - one glass every few weeks. She is active with her children and work. No smoking.    Social Determinants of Health   Financial Resource Strain: Low Risk  (11/12/2021)   Overall Financial Resource Strain (CARDIA)    Difficulty of Paying Living Expenses: Not very hard  Food Insecurity: No Food Insecurity (11/12/2021)   Hunger Vital Sign    Worried About Running Out of Food in the Last Year: Never true  Ran Out of Food in the Last Year: Never true  Transportation Needs: No Transportation Needs (11/12/2021)   PRAPARE - Administrator, Civil Service (Medical): No    Lack of Transportation (Non-Medical): No  Physical Activity: Insufficiently Active (11/12/2021)   Exercise Vital Sign    Days of Exercise per Week: 1 day    Minutes of Exercise per Session: 30 min  Stress: No Stress Concern Present (11/12/2021)   [employer] of Occupational Health - Occupational Stress Questionnaire    Feeling of Stress : Only a little  Social Connections: Socially Isolated (11/12/2021)   Social Connection and Isolation Panel [NHANES]    Frequency of Communication with Friends and Family: More than three times a week     Frequency of Social Gatherings with Friends and Family: Twice a week    Attends Religious Services: Never    Database administrator or Organizations: No    Attends Engineer, structural: Not on file    Marital Status: Never married    There were no vitals filed for this visit. There is no height or weight on file to calculate BMI.  Physical Exam  ASSESSMENT AND PLAN:  [name] was seen today for left foot pain.   There are no diagnoses linked to this encounter.  No follow-ups on file.  I, Shelby Obrien, acting as a scribe for Shelby [place]il[place], MD., have documented all relevant documentation on the behalf of Shelby [place], MD, as directed by  Shelby [place], MD while in the presence of Shelby [place], MD.   I, Shelby Obrien, have reviewed all documentation for this visit. The documentation on 02/03/23 for the exam, diagnosis, procedures, and orders are all accurate and complete.  Shelby G. [place], MD  [facility] [place] office.  Discharge Instructions   None

## 2023-02-04 ENCOUNTER — Ambulatory Visit: Payer: BC Managed Care – PPO | Admitting: Family Medicine

## 2023-02-04 ENCOUNTER — Encounter (HOSPITAL_COMMUNITY): Payer: Self-pay

## 2023-02-04 ENCOUNTER — Ambulatory Visit (HOSPITAL_COMMUNITY)
Admission: EM | Admit: 2023-02-04 | Discharge: 2023-02-04 | Disposition: A | Discharge status: Home / Self Care | Payer: BC Managed Care – PPO | Attending: Sports Medicine | Admitting: Sports Medicine

## 2023-02-04 DIAGNOSIS — L988 Other specified disorders of the skin and subcutaneous tissue: Secondary | ICD-10-CM

## 2023-02-04 DIAGNOSIS — B353 Tinea pedis: Secondary | ICD-10-CM

## 2023-02-04 MED ORDER — TERBINAFINE HCL 250 MG PO TABS: 250.0000 mg | ORAL_TABLET | Freq: Every day | ORAL | 0 refills | Status: AC

## 2023-02-04 NOTE — ED Triage Notes (Signed)
  Pt c/o dry skin to bottom of lt foot x2wks. States was treating it like athletes feet. States now its worse, on her toes, and pain to walk.

## 2023-02-04 NOTE — ED Provider Notes (Signed)
[facility]    CSN: [id number] Arrival date & time: 02/04/23  1012      History   Chief Complaint Chief Complaint  Patient presents with   Foot Pain    HPI Shelby Obrien is a 32 y.o. female.   She is presenting with CC of left foot pain. States she has had athletes foot that is being treated with topical ketoconazole 2% BID through her PCP however it is not improving and she thinks it is actually worse. She notes more pain at the base of her second toe over the past week and it is painful to walk on. No redness, warmth, or swelling. She is interested to see if there is an additional therapy option that can be tried.  The history is provided by the patient.  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   Vaginal delivery 08/05/2021   Symptomatic anemia 07/05/2021   Eczema 06/01/2019   Anxiety and depression 09/04/2017   Maternal obesity affecting pregnancy, antepartum 02/26/2017   Maternal morbid obesity, antepartum (HCC) 02/26/2017   Migraines 10/07/2016   Asthma 10/07/2016    Past Surgical History:  Procedure Laterality Date   WISDOM TOOTH EXTRACTION      OB History     Gravida  4   Para  3   Term  2   Preterm  1   AB  1   Living  3      SAB  1   IAB      Ectopic      Multiple  0   Live Births  3            Home Medications    Prior to Admission medications   Medication Sig Start Date End Date Taking? Authorizing Provider  terbinafine (LAMISIL) 250 MG tablet Take 1 tablet (250 mg total) by mouth daily. 02/04/23 03/18/23 Yes Marisa Cyphers, MD  albuterol (VENTOLIN HFA) 108 (90 Base) MCG/ACT inhaler Inhale 2 puffs into the lungs every 6 (six) hours as needed  for wheezing or shortness of breath. 09/08/22   Raspet, Noberto Retort, PA-C  budesonide-formoterol (SYMBICORT) 80-4.5 MCG/ACT inhaler Inhale 2 puffs into the lungs in the morning and at bedtime. 09/08/22   Raspet, Noberto Retort, PA-C  carisoprodol (SOMA) 350 MG tablet Take 1 tablet (350 mg total) by mouth 3 (three) times daily. 12/24/22   Crain, Whitney L, PA  ketoconazole (NIZORAL) 2 % cream Apply 1 Application topically 2 (two) times daily as needed for irritation. 01/20/23   Nelwyn Salisbury, MD  levonorgestrel (MIRENA) 20 MCG/DAY IUD 1 each by Intrauterine route once.    [provider]  NIFEdipine (PROCARDIA XL/NIFEDICAL XL) 60 MG 24 hr tablet Take 1 tablet (60 mg total) by mouth daily. 01/20/23   Nelwyn Salisbury, MD  phentermine 37.5 MG capsule Take 1 capsule (37.5 mg total) by mouth every morning. 10/17/22   Nelwyn Salisbury, MD  sertraline (ZOLOFT) 50 MG tablet TAKE 1 TABLET(50 MG) BY MOUTH DAILY 02/12/22   Nelwyn Salisbury, MD    Family History Family History  Problem Relation Age of Onset   Cervical cancer Mother  Diabetes Sister    Diabetes Paternal Uncle    Diabetes Maternal Grandmother    Heart disease Maternal Grandmother    Diabetes Paternal Grandmother    Heart disease Paternal Grandmother     Social History Social History   Tobacco Use   Smoking status: Former    Current packs/day: 0.00    Types: Cigarettes    Quit date: 01/02/2021    Years since quitting: 2.0   Smokeless tobacco: Never  Vaping Use   Vaping status: Never Used  Substance Use Topics   Alcohol use: No   Drug use: No     Allergies   Iodine, Shellfish allergy, Banana, Blueberry flavor, Other, and Raspberry   Review of Systems Review of Systems  Constitutional:  Negative for chills and fatigue.  Skin:  Positive for rash and wound. Negative for color change.  Neurological:  Negative for numbness.     Physical Exam Triage Vital Signs ED Triage Vitals  Encounter Vitals Group     BP 02/04/23 1107 (!)  140/87     Systolic BP Percentile --      Diastolic BP Percentile --      Pulse Rate 02/04/23 1107 66     Resp 02/04/23 1107 18     Temp 02/04/23 1107 98.4 F (36.9 C)     Temp Source 02/04/23 1107 Oral     SpO2 02/04/23 1107 97 %     Weight --      Height --      Head Circumference --      Peak Flow --      Pain Score 02/04/23 1109 8     Pain Loc --      Pain Education --      Exclude from Growth Chart --    No data found.  Updated Vital Signs BP (!) 140/87 (BP Location: Left Arm)   Pulse 66   Temp 98.4 F (36.9 C) (Oral)   Resp 18   SpO2 97%   Breastfeeding No   Visual Acuity Right Eye Distance:   Left Eye Distance:   Bilateral Distance:    Right Eye Near:   Left Eye Near:    Bilateral Near:     Physical Exam Constitutional:      General: She is not in acute distress.    Appearance: Normal appearance. She is not ill-appearing.  Cardiovascular:     Rate and Rhythm: Normal rate and regular rhythm.     Pulses: Normal pulses.  Pulmonary:     Effort: Pulmonary effort is normal.  Musculoskeletal:        General: No swelling or tenderness. Normal range of motion.     Left lower leg: No edema.  Skin:    Comments: Patient with diffuse scaling itching rash at the base of her left foot from the distal midfoot out her toes.  No significantly involving the dorsal aspect of the second toe and she does have a macerated wound measuring roughly 1 cm underneath her proximal skin fold on the plantar surface of her MTP joint.  No oozing, redness though it is mildly tender to palpation.  There is also extension of the scaling rash on the lateral aspect of the foot.  Neurological:     Mental Status: She is alert.        Media Information  Document Information  Photos    02/04/2023 11:22  Attached To:  Hospital Encounter on 02/04/23  Source Information  Glean Salen  D, MD  [facility]  Document History     UC Treatments / Results  Labs (all labs  ordered are listed, but only abnormal results are displayed) Labs Reviewed - No data to display  EKG   Radiology No results found.  Procedures Procedures (including critical care time)  Medications Ordered in UC Medications - No data to display  Initial Impression / Assessment and Plan / UC Course  I have reviewed the triage vital signs and the nursing notes.  Pertinent labs & imaging results that were available during my care of the patient were reviewed by me and considered in my medical decision making (see chart for details).    Patient with moderate tinea pedis with small macerated wound at the base of the second great toe with no evidence of superimposed infection.  She has failed to improve with 3 weeks of topical fungal medication use.  I think this warrants moving towards oral therapies.  Reviewed her most recent blood work and she had a normal hepatic function panel.  Will trial oral terbinafine for the next 6 weeks though advised that she check in with her primary care doctor in the next 2 weeks for a check on this macerated wound as well as for medication monitoring moving forward.  I did provide her education for keeping the wound clean and dry and continuation of the ketoconazole as previously prescribed.  Provided her small gauze pads to space between toes 1 and 2 as well as 2 and 3-8 and keeping the area dry.  Advised ibuprofen and Tylenol for pain.  Patient's questions were answered and she is in agreement with this plan.  Final Clinical Impressions(s) / UC Diagnoses   Final diagnoses:  Tinea pedis of left foot  Maceration of skin     Discharge Instructions      You have a tinea pedis (athlete foot) infection with a small macerated wound at the base of the second toe.  Continue the ketoconazole cream twice daily after the foot is bathed and and dried. On top of this I have sent a prescription for oral terbinafine which is a once daily fungal medication to take by  mouth.  I would like for you to recheck with your primary care doctor in 2 weeks for monitoring of the wound at the base of the toe as well as for monitoring on this new medication.  Like we discussed I want you to keep this aired out as much as possible over the next 1 to 2 weeks. If wearing socks, wear cotton socks and keep dry. Avoid putting any type of creams or lotions except what is prescribed on the feet. Wear shoes that breathe, try to avoid leather constricting shoes for the next couple weeks if possible.  Keep dressing in between toes that can help absorb extra moisture and provide some spacing between the toes.  You may consider padding within the shoe to offload pressure from the base of the second toe.     ED Prescriptions     Medication Sig Dispense Auth. Provider   terbinafine (LAMISIL) 250 MG tablet Take 1 tablet (250 mg total) by mouth daily. 42 tablet Marisa Cyphers, MD      PDMP not reviewed this encounter.   Marisa Cyphers, MD 02/04/23 1150

## 2023-02-04 NOTE — Discharge Instructions (Addendum)
You have a tinea pedis (athlete foot) infection with a small macerated wound at the base of the second toe.  Continue the ketoconazole cream twice daily after the foot is bathed and and dried. On top of this I have sent a prescription for oral terbinafine which is a once daily fungal medication to take by mouth.  I would like for you to recheck with your primary care doctor in 2 weeks for monitoring of the wound at the base of the toe as well as for monitoring on this new medication.  Like we discussed I want you to keep this aired out as much as possible over the next 1 to 2 weeks. If wearing socks, wear cotton socks and keep dry. Avoid putting any type of creams or lotions except what is prescribed on the feet. Wear shoes that breathe, try to avoid leather constricting shoes for the next couple weeks if possible.  Keep dressing in between toes that can help absorb extra moisture and provide some spacing between the toes.  You may consider padding within the shoe to offload pressure from the base of the second toe.

## 2023-02-05 ENCOUNTER — Ambulatory Visit: Payer: BC Managed Care – PPO | Admitting: Family Medicine

## 2023-02-10 ENCOUNTER — Telehealth: Payer: Self-pay | Admitting: Family Medicine

## 2023-02-11 NOTE — Telephone Encounter (Signed)
ERROR PLEASE DISREGARD

## 2023-02-16 ENCOUNTER — Other Ambulatory Visit: Payer: Self-pay | Admitting: Family Medicine

## 2023-02-21 ENCOUNTER — Telehealth: Payer: BC Managed Care – PPO | Admitting: Family Medicine

## 2023-02-21 DIAGNOSIS — R109 Unspecified abdominal pain: Secondary | ICD-10-CM

## 2023-02-21 NOTE — Patient Instructions (Signed)
[employer], thank you for joining Reed Pandy, PA-C for today's virtual visit.  While this provider is not your primary care provider (PCP), if your PCP is located in our provider database this encounter information will be shared with them immediately following your visit.   A [facility] MyChart account gives you access to today's visit and all your visits, tests, and labs performed at [facility] " click here if you don't have a [facility] MyChart account or go to [email or web address]  Consent: (Patient) Shelby Obrien provided verbal consent for this virtual visit at the beginning of the encounter.  Current Medications:  Current Outpatient Medications:    albuterol (VENTOLIN HFA) 108 (90 Base) MCG/ACT inhaler, Inhale 2 puffs into the lungs every 6 (six) hours as needed for wheezing or shortness of breath., Disp: 8 g, Rfl: 1   budesonide-formoterol (SYMBICORT) 80-4.5 MCG/ACT inhaler, Inhale 2 puffs into the lungs in the morning and at bedtime., Disp: 1 each, Rfl: 1   carisoprodol (SOMA) 350 MG tablet, Take 1 tablet (350 mg total) by mouth 3 (three) times daily., Disp: 30 tablet, Rfl: 0   ketoconazole (NIZORAL) 2 % cream, Apply 1 Application topically 2 (two) times daily as needed for irritation., Disp: 30 g, Rfl: 5   levonorgestrel (MIRENA) 20 MCG/DAY IUD, 1 each by Intrauterine route once., Disp: , Rfl:    NIFEdipine (PROCARDIA XL/NIFEDICAL XL) 60 MG 24 hr tablet, Take 1 tablet (60 mg total) by mouth daily., Disp: 90 tablet, Rfl: 3   phentermine 37.5 MG capsule, Take 1 capsule (37.5 mg total) by mouth every morning., Disp: 90 capsule, Rfl: 1   sertraline (ZOLOFT) 50 MG tablet, TAKE 1 TABLET(50 MG) BY MOUTH DAILY, Disp: 90 tablet, Rfl: 3   terbinafine (LAMISIL) 250 MG tablet, Take 1 tablet (250 mg total) by mouth daily., Disp: 42 tablet, Rfl: 0   Medications ordered in this encounter:  No orders of the defined types were placed in this encounter.     *If you need refills on other medications prior to your next appointment, please contact your pharmacy*  Follow-Up: Call back or seek an in-person evaluation if the symptoms worsen or if the condition fails to improve as anticipated.  [facility] [phone]  Other Instructions Abdominal Pain, Adult  Pain in the abdomen (abdominal pain) can be caused by many things. In most cases, it gets better with no treatment or by being treated at home. But in some cases, it can be serious. Your health care provider will ask questions about your medical history and do a physical exam to try to figure out what is causing your pain. Follow these instructions at home: Medicines Take over-the-counter and prescription medicines only as told by your provider. Do not take medicines that help you poop (laxatives) unless told by your provider. General instructions Watch your condition for any changes. Drink enough fluid to keep your pee (urine) pale yellow. Contact a health care provider if: Your pain changes, gets worse, or lasts longer than expected. You have severe cramping or bloating in your abdomen, or you vomit. Your pain gets worse with meals, after eating, or with certain foods. You are constipated or have diarrhea for more than 2-3 days. You are not hungry, or you lose weight without trying. You have signs of dehydration. These may include: Dark pee, very little pee, or no pee. Cracked lips or dry mouth. Sleepiness or weakness. You have pain when you pee (urinate) or poop.  Your abdominal pain wakes you up at night. You have blood in your pee. You have a fever. Get help right away if: You cannot stop vomiting. Your pain is only in one part of the abdomen. Pain on the right side could be caused by appendicitis. You have bloody or black poop (stool), or poop that looks like tar. You have trouble breathing. You have chest pain. These symptoms may be an emergency. Get help  right away. Call 911. Do not wait to see if the symptoms will go away. Do not drive yourself to the hospital. This information is not intended to replace advice given to you by your health care provider. Make sure you discuss any questions you have with your health care provider. Document Revised: 01/29/2022 Document Reviewed: 01/29/2022 Elsevier Patient Education  2024 Elsevier Inc.    If you have been instructed to have an in-person evaluation today at a local Urgent Care facility, please use the link below. It will take you to a list of all of our available [facility], including address, phone number and hours of operation. Please do not delay care.  [facility]  If you or a family member do not have a primary care provider, use the link below to schedule a visit and establish care. When you choose a [facility] primary care physician or advanced practice provider, you gain a long-term partner in health. Find a Primary Care Provider  Learn more about [facility]'s in-office and virtual care options: [facility] - Get Care Now

## 2023-02-21 NOTE — Progress Notes (Signed)
Virtual Visit Consent   [facility], you are scheduled for a virtual visit with a [facility] provider today. Just as with appointments in the office, your consent must be obtained to participate. Your consent will be active for this visit and any virtual visit you may have with one of our providers in the next 365 days. If you have a MyChart account, a copy of this consent can be sent to you electronically.  As this is a virtual visit, video technology does not allow for your provider to perform a traditional examination. This may limit your provider's ability to fully assess your condition. If your provider identifies any concerns that need to be evaluated in person or the need to arrange testing (such as labs, EKG, etc.), we will make arrangements to do so. Although advances in technology are sophisticated, we cannot ensure that it will always work on either your end or our end. If the connection with a video visit is poor, the visit may have to be switched to a telephone visit. With either a video or telephone visit, we are not always able to ensure that we have a secure connection.  By engaging in this virtual visit, you consent to the provision of healthcare and authorize for your insurance to be billed (if applicable) for the services provided during this visit. Depending on your insurance coverage, you may receive a charge related to this service.  I need to obtain your verbal consent now. Are you willing to proceed with your visit today? Shelby Obrien has provided verbal consent on 02/21/2023 for a virtual visit (video or telephone). Reed Pandy, [place]  Date: 02/21/2023 4:00 PM  Virtual Visit via Video Note   I, Reed Pandy, connected with  Shelby Obrien  ([id number], 01-04-1991) on 02/21/23 at  4:00 PM EDT by a video-enabled telemedicine application and verified that I am speaking with the correct person using two identifiers.  Location: Patient: Virtual  Visit Location Patient: [employer]ed Museum/gallery conservator: Virtual Visit Location Provider: Home Office   I discussed the limitations of evaluation and management by telemedicine and the availability of in person appointments. The patient expressed understanding and agreed to proceed.    History of Present Illness: Shelby Obrien is a 32 y.o. who identifies as a female who was assigned female at birth, and is being seen today for c/o I have been having really bad cramping pain at the lower part of her abdomen. Pt states symptoms on going for about 1 week. Pt states the cramping is on and off and now it is consistent. Pt denies burning with urination, Pt denies vaginal discharge or vaginal odor.  Pt denies constipation.  Pt reports being nauseous on and off. Pt denies any abnormal vaginal bleeding. Pt states has IUD.   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  Raspberry    Medications:  Current Outpatient Medications:    albuterol (VENTOLIN HFA) 108 (90 Base) MCG/ACT inhaler, Inhale 2 puffs into the lungs every 6 (six) hours as needed for wheezing or shortness of breath., Disp: 8 g, Rfl: 1   budesonide-formoterol (SYMBICORT) 80-4.5 MCG/ACT inhaler, Inhale 2 puffs into the lungs in the morning and at bedtime., Disp: 1 each, Rfl: 1   carisoprodol (SOMA) 350 MG tablet, Take 1  tablet (350 mg total) by mouth 3 (three) times daily., Disp: 30 tablet, Rfl: 0   ketoconazole (NIZORAL) 2 % cream, Apply 1 Application topically 2 (two) times daily as needed for irritation., Disp: 30 g, Rfl: 5   levonorgestrel (MIRENA) 20 MCG/DAY IUD, 1 each by Intrauterine route once., Disp: , Rfl:    NIFEdipine (PROCARDIA XL/NIFEDICAL XL) 60 MG 24 hr tablet, Take 1 tablet (60 mg total) by mouth daily., Disp: 90 tablet, Rfl: 3   phentermine 37.5 MG capsule, Take 1 capsule (37.5 mg total) by mouth every morning., Disp: 90 capsule, Rfl: 1   sertraline (ZOLOFT) 50 MG tablet, TAKE 1 TABLET(50 MG) BY MOUTH DAILY, Disp: 90 tablet, Rfl: 3   terbinafine (LAMISIL) 250 MG tablet, Take 1 tablet (250 mg total) by mouth daily., Disp: 42 tablet, Rfl: 0  Observations/Objective: Patient is well-developed, well-nourished in no acute distress.  Resting comfortably at home.  Head is normocephalic, atraumatic.  No labored breathing.  Speech is clear and coherent with logical content.  Patient is alert and oriented at baseline.    Assessment and Plan: 1. Abdominal pain, unspecified abdominal location  -Pt noted with low abdominal pain  -Advised Pt to take over the counter medication like Tylenol or Ibuprofen for pain as needed -Advised Pt to F/U with PCP for further testing  -Advised Pt if symptoms persist or worsen, to proceed to the emergency room -Pt verbalized understanding.  Follow Up Instructions: I discussed the assessment and treatment plan with the patient. The patient was provided an opportunity to ask questions and all were answered. The patient agreed with the plan and demonstrated an understanding of the instructions.  A copy of instructions were sent to the patient via MyChart unless otherwise noted below.     The patient was advised to call back or seek an in-person evaluation if the symptoms worsen or if the condition fails to improve as anticipated.    Reed Pandy, PA-C

## 2023-02-27 ENCOUNTER — Encounter: Payer: Self-pay | Admitting: Family Medicine

## 2023-02-27 ENCOUNTER — Ambulatory Visit (INDEPENDENT_AMBULATORY_CARE_PROVIDER_SITE_OTHER): Payer: BC Managed Care – PPO | Admitting: Family Medicine

## 2023-02-27 VITALS — BP 124/86 | HR 77 | Temp 98.1°F | Wt 294.0 lb

## 2023-02-27 DIAGNOSIS — R1030 Lower abdominal pain, unspecified: Secondary | ICD-10-CM

## 2023-02-27 DIAGNOSIS — B353 Tinea pedis: Secondary | ICD-10-CM | POA: Diagnosis not present

## 2023-02-27 DIAGNOSIS — Z1371 Encounter for nonprocreative screening for genetic disease carrier status: Secondary | ICD-10-CM

## 2023-02-27 DIAGNOSIS — Z9189 Other specified personal risk factors, not elsewhere classified: Secondary | ICD-10-CM

## 2023-02-27 HISTORY — DX: Encounter for nonprocreative screening for genetic disease carrier status: Z13.71

## 2023-02-27 HISTORY — DX: Other specified personal risk factors, not elsewhere classified: Z91.89

## 2023-02-27 LAB — POC URINALSYSI DIPSTICK (AUTOMATED)
Bilirubin, UA: NEGATIVE
Blood, UA: NEGATIVE
Glucose, UA: NEGATIVE
Ketones, UA: NEGATIVE
Leukocytes, UA: NEGATIVE
Nitrite, UA: NEGATIVE
Protein, UA: POSITIVE — AB
Spec Grav, UA: 1.02 (ref 1.010–1.025)
Urobilinogen, UA: 0.2 U/dL
pH, UA: 6 (ref 5.0–8.0)

## 2023-02-27 LAB — POCT URINE PREGNANCY: Preg Test, Ur: NEGATIVE

## 2023-02-27 NOTE — Progress Notes (Signed)
   Subjective:    Patient ID: Shelby Obrien, female    DOB: 1990-05-09, 32 y.o.   MRN: [id number]  HPI Here for several issues. First she is following up on an urgent care visit on 02-04-23 for severe athlete's foot on the left foot. She had been treating this with Ketoconazole cream, but this was not working and a split in the skin had opened up beneath the second toe. They started her on 6 weeks of oral Terbinafine 250 mg daily, and this has helped a lot. The fissure has closed and the scaling looks better. The other issue is one week of intermittent cramping pains in the lower abdomen. The right side is the worst, but the left side hurts also. She has had some nausea but no vomiting. No fever. No vaginal odor or DC. Her BM's have been regular. No urinary symptoms. Her UA today is clear. She has had an IUD in place for about a year, and she says she had never missed a period until she missed one last week. A urine pregnancy test today is negative.    Review of Systems  Constitutional: Negative.   Respiratory: Negative.    Cardiovascular: Negative.   Gastrointestinal: Negative.   Genitourinary:  Positive for pelvic pain. Negative for dysuria, frequency, hematuria, urgency, vaginal bleeding and vaginal discharge.  Skin:  Positive for rash.       Objective:   Physical Exam Constitutional:      Appearance: Normal appearance. She is not ill-appearing.  Cardiovascular:     Rate and Rhythm: Normal rate and regular rhythm.     Pulses: Normal pulses.     Heart sounds: Normal heart sounds.  Pulmonary:     Effort: Pulmonary effort is normal.     Breath sounds: Normal breath sounds.  Abdominal:     General: Abdomen is flat. Bowel sounds are normal. There is no distension.     Palpations: Abdomen is soft. There is no mass.     Tenderness: There is no right CVA tenderness, left CVA tenderness, guarding or rebound.     Hernia: No hernia is present.     Comments: She is moderately tender  across the entire lower abdomen  Skin:    Comments: The plantar surface of her left foot has widespread scaling. The area under the left 2nd toe is closed.   Neurological:     Mental Status: She is alert.           Assessment & Plan:  For the tinea pedis, this is responding to the combination of topical Ketoconazole and oral Terbinafine. She will continue on this regimen. For the lower abdominal pain, we will send the urine sample for a culture. She needs a pelvic exam and a possible pelvic US, so she is scheduled to see Wolfgang Phoenix on 03-02-23 at 10:15 am.  Gershon Crane, MD

## 2023-02-27 NOTE — Addendum Note (Signed)
Addended by: Carola Rhine on: 02/27/2023 03:07 PM   Modules accepted: Orders

## 2023-02-28 LAB — URINE CULTURE
MICRO NUMBER:: 15675412
SPECIMEN QUALITY:: ADEQUATE

## 2023-03-02 ENCOUNTER — Ambulatory Visit (INDEPENDENT_AMBULATORY_CARE_PROVIDER_SITE_OTHER): Payer: BC Managed Care – PPO | Admitting: Obstetrics

## 2023-03-02 ENCOUNTER — Encounter: Payer: Self-pay | Admitting: Obstetrics

## 2023-03-02 ENCOUNTER — Other Ambulatory Visit (HOSPITAL_COMMUNITY)
Admission: RE | Admit: 2023-03-02 | Discharge: 2023-03-02 | Disposition: A | Discharge status: Home / Self Care | Payer: BC Managed Care – PPO | Source: Ambulatory Visit | Attending: Obstetrics and Gynecology | Admitting: Obstetrics and Gynecology

## 2023-03-02 VITALS — BP 112/89 | HR 77 | Resp 16 | Ht 64.0 in | Wt 297.3 lb

## 2023-03-02 DIAGNOSIS — Z975 Presence of (intrauterine) contraceptive device: Secondary | ICD-10-CM | POA: Diagnosis not present

## 2023-03-02 DIAGNOSIS — N912 Amenorrhea, unspecified: Secondary | ICD-10-CM

## 2023-03-02 DIAGNOSIS — B9689 Other specified bacterial agents as the cause of diseases classified elsewhere: Secondary | ICD-10-CM | POA: Insufficient documentation

## 2023-03-02 DIAGNOSIS — R102 Pelvic and perineal pain: Secondary | ICD-10-CM | POA: Diagnosis not present

## 2023-03-02 DIAGNOSIS — Z113 Encounter for screening for infections with a predominantly sexual mode of transmission: Secondary | ICD-10-CM | POA: Insufficient documentation

## 2023-03-02 DIAGNOSIS — N760 Acute vaginitis: Secondary | ICD-10-CM | POA: Insufficient documentation

## 2023-03-02 DIAGNOSIS — Z3202 Encounter for pregnancy test, result negative: Secondary | ICD-10-CM | POA: Diagnosis not present

## 2023-03-02 LAB — POCT URINALYSIS DIPSTICK
Bilirubin, UA: NEGATIVE
Blood, UA: NEGATIVE
Glucose, UA: NEGATIVE
Ketones, UA: NEGATIVE
Nitrite, UA: NEGATIVE
Protein, UA: POSITIVE — AB
Spec Grav, UA: 1.015 (ref 1.010–1.025)
Urobilinogen, UA: 0.2 U/dL
pH, UA: 6.5 (ref 5.0–8.0)

## 2023-03-02 LAB — POCT URINE PREGNANCY: Preg Test, Ur: NEGATIVE

## 2023-03-02 NOTE — Progress Notes (Signed)
    GYNECOLOGY PROGRESS NOTE  Subjective:    Patient ID: Shelby Obrien, female    DOB: [birth year], 32 y.o.   MRN: [id number]  HPI  Patient is a 32 y.o. [phone] female who presents for evaluation of pelvic pain and amenorrhea. She has been having constant sharp, throbbing pain in her pelvic area x 2 weeks. She has a Mirena IUD in x 18 months. She missed her cycle last month and that is when the pain started. Only a hot shower realizes the pain. Patient's last menstrual period was 01/10/2023 (exact date). She is partnered, sexually active. She denies constipation, diarrhea, reflux. She has tried taking Tylenol which relieved a headache, but not the lower abdominal pain   The following portions of the patient's history were reviewed and updated as appropriate: allergies, current medications, past family history, past medical history, past social history, past surgical history, and problem list.  Review of Systems Pertinent items noted in HPI and remainder of comprehensive ROS otherwise negative.   Objective:   Blood pressure 112/89, pulse 77, resp. rate 16, height 5\' 4"  (1.626 m), weight 297 lb 4.8 oz (134.9 kg), last menstrual period 01/10/2023, not currently breastfeeding. Body mass index is 51.03 kg/m. General appearance: alert, cooperative, and no distress Abdomen:  + bowel sounds all four quadrants, some tenderness with deeper palpation across her lower abdomen. No rebound tenderness Pelvic: cervix normal in appearance, external genitalia normal, rectovaginal septum normal, uterus normal size, shape, and consistency, and She has cervical motion tenderness and is tender nearer to her right adnexal aread. Due to body habitus, unable to fully palpate uterus. Normal vaginal mucosa, + malodor noted, IUD strings were palpated. Some difficulty with visualization due to hihg BMI.  Large amount of stool noted with internal exam. Extremities: extremities normal, atraumatic, no cyanosis or  edema Neurologic: Grossly normal   Assessment:   Lower abdominal pian versus pelvic pain Cervical motion tenderness. IUD in place   Plan:  Will screen for GC and CT. Aptima swab sent. Pelvic ultrasound ordered for this week. Will check for IUD placement and review her Aptima results. Also encouraged her to purchase some Miralax and produce a bowel movement.  She is overdue for a GYN physical.Encouraged to make an appointment for this.  Mirna Mires, CNM  03/02/2023 11:37 AM      Paula Compton, CMN [facility]

## 2023-03-03 ENCOUNTER — Encounter: Payer: Self-pay | Admitting: Obstetrics and Gynecology

## 2023-03-03 LAB — CERVICOVAGINAL ANCILLARY ONLY
Bacterial Vaginitis (gardnerella): POSITIVE — AB
Candida Glabrata: NEGATIVE
Candida Vaginitis: NEGATIVE
Chlamydia: NEGATIVE
Comment: NEGATIVE
Comment: NEGATIVE
Comment: NEGATIVE
Comment: NEGATIVE
Comment: NEGATIVE
Comment: NORMAL
Neisseria Gonorrhea: NEGATIVE
Trichomonas: NEGATIVE

## 2023-03-05 ENCOUNTER — Ambulatory Visit (INDEPENDENT_AMBULATORY_CARE_PROVIDER_SITE_OTHER): Payer: BC Managed Care – PPO

## 2023-03-05 DIAGNOSIS — R102 Pelvic and perineal pain: Secondary | ICD-10-CM

## 2023-03-09 ENCOUNTER — Encounter (HOSPITAL_COMMUNITY): Payer: Self-pay | Admitting: *Deleted

## 2023-03-09 ENCOUNTER — Ambulatory Visit (HOSPITAL_COMMUNITY)
Admission: EM | Admit: 2023-03-09 | Discharge: 2023-03-09 | Disposition: A | Discharge status: Home / Self Care | Payer: BC Managed Care – PPO | Attending: Family Medicine | Admitting: Family Medicine

## 2023-03-09 ENCOUNTER — Ambulatory Visit (INDEPENDENT_AMBULATORY_CARE_PROVIDER_SITE_OTHER): Payer: BC Managed Care – PPO

## 2023-03-09 ENCOUNTER — Other Ambulatory Visit: Payer: Self-pay

## 2023-03-09 DIAGNOSIS — M79674 Pain in right toe(s): Secondary | ICD-10-CM

## 2023-03-09 MED ORDER — MELOXICAM 15 MG PO TABS: 15.0000 mg | ORAL_TABLET | Freq: Every day | ORAL | 0 refills | Status: DC

## 2023-03-09 NOTE — ED Provider Notes (Signed)
[facility]    CSN: [id number] Arrival date & time: 03/09/23  4098      History   Chief Complaint Chief Complaint  Patient presents with   Toe Injury    HPI Shelby Obrien is a 32 y.o. female.   Patient hit her fourth toe between the wall a few days ago.  Patient states that she feels like it got caught between her third and fourth toe.  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  Patient denies any numbness and tingling of the area but just notes that there is mainly pain over the fourth toe.  Patient has no other concerns at this time.     Past Medical History:  Diagnosis Date   Anemia affecting pregnancy in third trimester    Asthma    Maternal morbid obesity, antepartum (HCC) 02/26/2017   Maternal obesity affecting pregnancy, antepartum 02/26/2017   Migraines    Obesity    BMI>40   Rapid palpitations 03/01/2021   Tachycardia, unspecified    Vaginal delivery 08/05/2021    Patient Active Problem List   Diagnosis Date Noted   Pelvic pain 03/02/2023   IUD (intrauterine device) in place 03/02/2023   Morbid obesity with body mass index (BMI) of 40.0 or higher (HCC) 03/02/2023   Postpartum hypertension 09/08/2021   Symptomatic anemia 07/05/2021   Eczema 06/01/2019   Anxiety and depression 09/04/2017   Migraines 10/07/2016   Asthma 10/07/2016    Past Surgical History:  Procedure Laterality Date   WISDOM TOOTH EXTRACTION      OB History     Gravida  4   Para  3   Term  2   Preterm  1   AB  1   Living  3      SAB  1   IAB      Ectopic      Multiple  0   Live Births  3            Home Medications    Prior to Admission medications   Medication Sig Start Date End Date Taking? Authorizing Provider  albuterol (VENTOLIN HFA) 108 (90 Base) MCG/ACT inhaler Inhale 2 puffs into the lungs every 6 (six) hours as needed for wheezing or shortness of breath. 09/08/22  Yes Raspet, Denny Peon K, PA-C  budesonide-formoterol  (SYMBICORT) 80-4.5 MCG/ACT inhaler Inhale 2 puffs into the lungs in the morning and at bedtime. 09/08/22  Yes Raspet, Erin K, PA-C  ketoconazole (NIZORAL) 2 % cream Apply 1 Application topically 2 (two) times daily as needed for irritation. 01/20/23  Yes Nelwyn Salisbury, MD  levonorgestrel (MIRENA) 20 MCG/DAY IUD 1 each by Intrauterine route once.   Yes [provider]  NIFEdipine (PROCARDIA XL/NIFEDICAL XL) 60 MG 24 hr tablet Take 1 tablet (60 mg total) by mouth daily. 01/20/23  Yes Nelwyn Salisbury, MD  phentermine 37.5 MG capsule Take 1 capsule (37.5 mg total) by mouth every morning. 10/17/22  Yes Nelwyn Salisbury, MD  sertraline (ZOLOFT) 50 MG tablet TAKE 1 TABLET(50 MG) BY MOUTH DAILY 02/16/23  Yes Nelwyn Salisbury, MD  terbinafine (LAMISIL) 250 MG tablet Take 1 tablet (250 mg total) by mouth daily. 02/04/23 03/18/23 Yes Marisa Cyphers, MD  carisoprodol (SOMA) 350 MG tablet Take 1 tablet (350 mg total) by mouth 3 (three) times daily. 12/24/22   Maretta Bees, PA    Family History Family History  Problem Relation Age of  Onset   Cervical cancer Mother    Diabetes Sister    Diabetes Paternal Uncle    Diabetes Maternal Grandmother    Heart disease Maternal Grandmother    Diabetes Paternal Grandmother    Heart disease Paternal Grandmother     Social History Social History   Tobacco Use   Smoking status: Former    Current packs/day: 0.00    Types: Cigarettes    Quit date: 01/02/2021    Years since quitting: 2.1   Smokeless tobacco: Never  Vaping Use   Vaping status: Never Used  Substance Use Topics   Alcohol use: No   Drug use: No     Allergies   Iodine, Shellfish allergy, Banana, Blueberry flavor, Other, and Raspberry   Review of Systems Review of Systems   Physical Exam Triage Vital Signs ED Triage Vitals  Encounter Vitals Group     BP 03/09/23 0852 (!) 139/93     Systolic BP Percentile --      Diastolic BP Percentile --      Pulse Rate 03/09/23 0852 68      Resp 03/09/23 0852 20     Temp 03/09/23 0852 97.8 F (36.6 C)     Temp src --      SpO2 03/09/23 0852 97 %     Weight --      Height --      Head Circumference --      Peak Flow --      Pain Score 03/09/23 0848 8     Pain Loc --      Pain Education --      Exclude from Growth Chart --    No data found.  Updated Vital Signs BP (!) 139/93   Pulse 68   Temp 97.8 F (36.6 C)   Resp 20   LMP 01/10/2023 (Exact Date)   SpO2 97%   Visual Acuity Right Eye Distance:   Left Eye Distance:   Bilateral Distance:    Right Eye Near:   Left Eye Near:    Bilateral Near:     Physical Exam  There is mild swelling of the fourth toe, no angulation noted.  Mild bruising noted.  There is tenderness to palpation over the volar and dorsal side of the fourth toe.  Sensation is intact.  Patient able to flex and extend the toe though pain noted.  Range of motion is appropriate.   UC Treatments / Results  Labs (all labs ordered are listed, but only abnormal results are displayed) Labs Reviewed - No data to display  EKG   Radiology No results found.  Procedures Procedures (including critical care time)  Medications Ordered in UC Medications - No data to display  Initial Impression / Assessment and Plan / UC Course  I have reviewed the triage vital signs and the nursing notes.  Pertinent labs & imaging results that were available during my care of the patient were reviewed by me and considered in my medical decision making (see chart for details).     X-ray showed no sign of fracture, will go ahead and do meloxicam for pain for the next 1 week.  Patient advised to be weightbearing as tolerated.  Patient understanding and agreeable with plan.   Final Clinical Impressions(s) / UC Diagnoses   Final diagnoses:  Pain of toe of right foot     Discharge Instructions      Please take your meloxicam daily for the next 1 week.  ED Prescriptions   None    PDMP not  reviewed this encounter.   Brenton Grills, MD 03/09/23 1004

## 2023-03-09 NOTE — Discharge Instructions (Signed)
Please take your meloxicam daily for the next 1 week.

## 2023-03-09 NOTE — ED Triage Notes (Signed)
Pt reports injury to RT 4th toe after hitting the toe on a wall last night.

## 2023-03-16 ENCOUNTER — Ambulatory Visit (INDEPENDENT_AMBULATORY_CARE_PROVIDER_SITE_OTHER): Payer: BC Managed Care – PPO | Admitting: Obstetrics

## 2023-03-16 ENCOUNTER — Encounter: Payer: Self-pay | Admitting: Obstetrics

## 2023-03-16 VITALS — BP 115/61 | HR 82 | Resp 16 | Ht 64.0 in | Wt 296.0 lb

## 2023-03-16 DIAGNOSIS — Z8041 Family history of malignant neoplasm of ovary: Secondary | ICD-10-CM | POA: Diagnosis not present

## 2023-03-16 DIAGNOSIS — Z800 Family history of malignant neoplasm of digestive organs: Secondary | ICD-10-CM | POA: Diagnosis not present

## 2023-03-16 DIAGNOSIS — Z01419 Encounter for gynecological examination (general) (routine) without abnormal findings: Secondary | ICD-10-CM | POA: Diagnosis not present

## 2023-03-16 DIAGNOSIS — Z803 Family history of malignant neoplasm of breast: Secondary | ICD-10-CM

## 2023-03-16 DIAGNOSIS — Z808 Family history of malignant neoplasm of other organs or systems: Secondary | ICD-10-CM | POA: Diagnosis not present

## 2023-03-16 NOTE — Progress Notes (Signed)
GYNECOLOGY ANNUAL PHYSICAL EXAM PROGRESS NOTE  Subjective:    Shelby Obrien is a 32 y.o. [phone] female who presents for an annual exam.  The patient is sexually active. The patient participates in regular exercise: no. Has the patient ever been transfused or tattooed?: yes. The patient reports that there is not domestic violence in her life.   The patient has the following complaints today: None She is trying to start a weight loss program. Placed on Phentermine by her PCP, but stopped after 7 days due to feeling too jittery. She declines STI testing or swab for BV or Yeast. Had BV at last visit.  Menstrual History: Menarche age: 63 Patient's last menstrual period was 01/10/2023 (exact date).     Gynecologic History:  Contraception: IUD History of STI's: Denies Last Pap: 01/17/2021. Results were: normal. Denies h/o abnormal pap smears. Last mammogram: Not age appropriate     OB History  Gravida Para Term Preterm AB Living  4 3 2 1 1 3   SAB IAB Ectopic Multiple Live Births  1 0 0 0 3    # Outcome Date GA Lbr Len/2nd Weight Sex Type Anes PTL Lv  4 Preterm 08/05/21 [redacted]w[redacted]d 04:56 / 00:17 6 lb 5.1 oz (2.866 kg) M Vag-Spont EPI  LIV     Name: Strength,BOY Fiona     Apgar1: 9  Apgar5: 9  3 Term 10/03/17 [redacted]w[redacted]d / 00:28 6 lb 11.9 oz (3.06 kg) F Vag-Spont EPI  LIV     Name: Tucholski,GIRL Elga     Apgar1: 8  Apgar5: 9  2 Term 04/09/14 [redacted]w[redacted]d  7 lb 5 oz (3.317 kg) F  EPI N LIV     Name: Jamya  1 SAB             Past Medical History:  Diagnosis Date   Anemia affecting pregnancy in third trimester    Asthma    Maternal morbid obesity, antepartum (HCC) 02/26/2017   Maternal obesity affecting pregnancy, antepartum 02/26/2017   Migraines    Obesity    BMI>40   Rapid palpitations 03/01/2021   Tachycardia, unspecified    Vaginal delivery 08/05/2021    Past Surgical History:  Procedure Laterality Date   WISDOM TOOTH EXTRACTION      Family History  Problem  Relation Age of Onset   Cervical cancer Mother    Diabetes Sister    Diabetes Paternal Uncle    Diabetes Maternal Grandmother    Heart disease Maternal Grandmother    Diabetes Paternal Grandmother    Heart disease Paternal Grandmother     Social History   Socioeconomic History   Marital status: Single    Spouse name: Not on file   Number of children: 2   Years of education: 16   Highest education level: Associate degree: academic program  Occupational History   Occupation: Multimedia programmer: [employer]  Tobacco Use   Smoking status: Former    Current packs/day: 0.00    Types: Cigarettes    Quit date: 01/02/2021    Years since quitting: 2.2   Smokeless tobacco: Never  Vaping Use   Vaping status: Never Used  Substance and Sexual Activity   Alcohol use: No   Drug use: No   Sexual activity: Not Currently    Birth control/protection: I.U.D.  Other Topics Concern   Not on file  Social History Narrative   Lives with boyfriend and 2 daughters in an apartment on the 2nd floor.  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  Education: college. She is right-handed. Caffeine - one glass every few weeks. She is active with her children and work. No smoking.    Social Determinants of Health   Financial Resource Strain: Low Risk  (11/12/2021)   Overall Financial Resource Strain (CARDIA)    Difficulty of Paying Living Expenses: Not very hard  Food Insecurity: No Food Insecurity (11/12/2021)   Hunger Vital Sign    Worried About Running Out of Food in the Last Year: Never true    Ran Out of Food in the Last Year: Never true  Transportation Needs: No Transportation Needs (11/12/2021)   PRAPARE - Administrator, Civil Service (Medical): No    Lack of Transportation (Non-Medical): No  Physical Activity: Insufficiently Active (11/12/2021)   Exercise Vital Sign    Days of Exercise per Week: 1 day    Minutes of Exercise per Session: 30 min  Stress: No Stress Concern Present (11/12/2021)    [employer] of Occupational Health - Occupational Stress Questionnaire    Feeling of Stress : Only a little  Social Connections: Socially Isolated (11/12/2021)   Social Connection and Isolation Panel [NHANES]    Frequency of Communication with Friends and Family: More than three times a week    Frequency of Social Gatherings with Friends and Family: Twice a week    Attends Religious Services: Never    Database administrator or Organizations: No    Attends Engineer, structural: Not on file    Marital Status: Never married  Intimate Partner Violence: Not on file    Current Outpatient Medications on File Prior to Visit  Medication Sig Dispense Refill   albuterol (VENTOLIN HFA) 108 (90 Base) MCG/ACT inhaler Inhale 2 puffs into the lungs every 6 (six) hours as needed for wheezing or shortness of breath. 8 g 1   budesonide-formoterol (SYMBICORT) 80-4.5 MCG/ACT inhaler Inhale 2 puffs into the lungs in the morning and at bedtime. 1 each 1   carisoprodol (SOMA) 350 MG tablet Take 1 tablet (350 mg total) by mouth 3 (three) times daily. 30 tablet 0   ketoconazole (NIZORAL) 2 % cream Apply 1 Application topically 2 (two) times daily as needed for irritation. 30 g 5   levonorgestrel (MIRENA) 20 MCG/DAY IUD 1 each by Intrauterine route once.     meloxicam (MOBIC) 15 MG tablet Take 1 tablet (15 mg total) by mouth daily. 30 tablet 0   NIFEdipine (PROCARDIA XL/NIFEDICAL XL) 60 MG 24 hr tablet Take 1 tablet (60 mg total) by mouth daily. 90 tablet 3   phentermine 37.5 MG capsule Take 1 capsule (37.5 mg total) by mouth every morning. 90 capsule 1   sertraline (ZOLOFT) 50 MG tablet TAKE 1 TABLET(50 MG) BY MOUTH DAILY 90 tablet 3   terbinafine (LAMISIL) 250 MG tablet Take 1 tablet (250 mg total) by mouth daily. 42 tablet 0   No current facility-administered medications on file prior to visit.    Allergies  Allergen Reactions   Iodine Anaphylaxis    Reports swelling and had to get epipen after  iodine exposure   Shellfish Allergy Anaphylaxis   Banana    Blueberry Flavor    Other     BLUEBERRIES, BANANAS, TREE NUTS, RASPBERRIES, GRAPES, POLLENS/SEASONAL.   Raspberry      Review of Systems Constitutional: negative for chills, fatigue, fevers and sweats Eyes: negative for irritation, redness and visual disturbance Ears, nose, mouth, throat, and face:  negative for hearing loss, nasal congestion, snoring and tinnitus Respiratory: negative for asthma, cough, sputum Cardiovascular: negative for chest pain, dyspnea, exertional chest pressure/discomfort, irregular heart beat, palpitations and syncope Gastrointestinal: negative for abdominal pain, change in bowel habits, nausea and vomiting Genitourinary: negative for abnormal menstrual periods, genital lesions, sexual problems and vaginal discharge, dysuria and urinary incontinence Integument/breast: negative for breast lump, breast tenderness and nipple discharge Hematologic/lymphatic: negative for bleeding and easy bruising Musculoskeletal:negative for back pain and muscle weakness Neurological: negative for dizziness, headaches, vertigo and weakness Endocrine: negative for diabetic symptoms including polydipsia, polyuria and skin dryness Allergic/Immunologic: negative for hay fever and urticaria      Objective:  Blood pressure 115/61, pulse 82, resp. rate 16, height 5\' 4"  (1.626 m), weight 296 lb (134.3 kg), last menstrual period 01/10/2023, not currently breastfeeding. Body mass index is 50.81 kg/m.    General Appearance:    Alert, cooperative, no distress, appears stated age. Elevated BMI (50)  Head:    Normocephalic, without obvious abnormality, atraumatic  Eyes:    PERRL, conjunctiva/corneas clear, EOM's intact, both eyes  Ears:    Normal external ear canals, both ears  Nose:   Nares normal, septum midline, mucosa normal, no drainage or sinus tenderness  Throat:   Lips, mucosa, and tongue normal; teeth and gums normal   Neck:   Supple, symmetrical, trachea midline, no adenopathy; thyroid: no enlargement/tenderness/nodules; no carotid bruit or JVD  Back:     Symmetric, no curvature, ROM normal, no CVA tenderness  Lungs:     Clear to auscultation bilaterally, respirations unlabored  Chest Wall:    No tenderness or deformity   Heart:    Regular rate and rhythm, S1 and S2 normal, no murmur, rub or gallop  Breast Exam:    No tenderness, masses, or nipple abnormality. Pendulous. Larger cup size.  Abdomen:     Soft, non-tender, bowel sounds active all four quadrants, no masses, no organomegaly.    Genitalia:    Pelvic:external genitalia normal, vagina without lesions, discharge, or tenderness, rectovaginal septum  normal. Cervix normal in appearance, no cervical motion tenderness, no adnexal masses or tenderness.  Uterus difficult to access with bimanual due to body habitus  nontender. IUD strings palpated with bimanual exam.  Rectal:    Normal external sphincter.  No hemorrhoids appreciated. Internal exam not done.   Extremities:   Extremities normal, atraumatic, no cyanosis or edema  Pulses:   2+ and symmetric all extremities  Skin:   Skin color, texture, turgor normal, no rashes or lesions  Lymph nodes:   Cervical, supraclavicular, and axillary nodes normal  Neurologic:   CNII-XII intact, normal strength, sensation and reflexes throughout   .  Labs:  Lab Results  Component Value Date   WBC 7.3 01/26/2023   HGB 12.9 01/26/2023   HCT 38.8 01/26/2023   MCV 88.4 01/26/2023   PLT 254.0 01/26/2023    Lab Results  Component Value Date   CREATININE 0.67 01/26/2023   BUN 12 01/26/2023   NA 137 01/26/2023   K 3.8 01/26/2023   CL 105 01/26/2023   CO2 23 01/26/2023    Lab Results  Component Value Date   ALT 9 01/26/2023   AST 14 01/26/2023   ALKPHOS 92 01/26/2023   BILITOT 0.8 01/26/2023    Lab Results  Component Value Date   TSH 1.04 01/26/2023     Assessment:   1. Encounter for well woman  exam with routine gynecological exam  Chronic hypertension Morbid obesity- BMI 50. Offered referral to Medical wt loss group in [place].  Screens + for risk for breast Cancer.  Plan:  Blood tests: UTD. Breast self exam technique reviewed and patient encouraged to perform self-exam monthly. Contraception: IUD. Discussed healthy lifestyle modifications. Mammogram  : Not age appropriate Pap smear  UTD . Flu vaccine:Declined  We discussed the BRCA and genetic testing- She desires this today and has a strong family Hx. Blood drawn. Will have the company call her and review her test results. Follow up in 1 year for annual exam  No pap due. She should commence with annual mammograms at age 44.    Paula Compton, CNM [facility]

## 2023-03-16 NOTE — Patient Instructions (Signed)
Preventive Care 33-33 Years Old, Female Preventive care refers to lifestyle choices and visits with your health care provider that can promote health and wellness. Preventive care visits are also called wellness exams. What can I expect for my preventive care visit? Counseling Your health care provider may ask you questions about your: Medical history, including: Past medical problems. Family medical history. Pregnancy history. Current health, including: Menstrual cycle. Method of birth control. Emotional well-being. Home life and relationship well-being. Sexual activity and sexual health. Lifestyle, including: Alcohol, nicotine or tobacco, and drug use. Access to firearms. Diet, exercise, and sleep habits. Work and work environment. Sunscreen use. Safety issues such as seatbelt and bike helmet use. Physical exam Your health care provider will check your: Height and weight. These may be used to calculate your BMI (body mass index). BMI is a measurement that tells if you are at a healthy weight. Waist circumference. This measures the distance around your waistline. This measurement also tells if you are at a healthy weight and may help predict your risk of certain diseases, such as type 2 diabetes and high blood pressure. Heart rate and blood pressure. Body temperature. Skin for abnormal spots. What immunizations do I need?  Vaccines are usually given at various ages, according to a schedule. Your health care provider will recommend vaccines for you based on your age, medical history, and lifestyle or other factors, such as travel or where you work. What tests do I need? Screening Your health care provider may recommend screening tests for certain conditions. This may include: Lipid and cholesterol levels. Diabetes screening. This is done by checking your blood sugar (glucose) after you have not eaten for a while (fasting). Pelvic exam and Pap test. Hepatitis B test. Hepatitis C  test. HIV (human immunodeficiency virus) test. STI (sexually transmitted infection) testing, if you are at risk. Lung cancer screening. Colorectal cancer screening. Mammogram. Talk with your health care provider about when you should start having regular mammograms. This may depend on whether you have a family history of breast cancer. BRCA-related cancer screening. This may be done if you have a family history of breast, ovarian, tubal, or peritoneal cancers. Bone density scan. This is done to screen for osteoporosis. Talk with your health care provider about your test results, treatment options, and if necessary, the need for more tests. Follow these instructions at home: Eating and drinking  Eat a diet that includes fresh fruits and vegetables, whole grains, lean protein, and low-fat dairy products. Take vitamin and mineral supplements as recommended by your health care provider. Do not drink alcohol if: Your health care provider tells you not to drink. You are pregnant, may be pregnant, or are planning to become pregnant. If you drink alcohol: Limit how much you have to 0-1 drink a day. Know how much alcohol is in your drink. In the [place] one drink equals one 12 oz bottle of beer (355 mL), one 5 oz glass of wine (148 mL), or one 1 oz glass of hard liquor (44 mL). Lifestyle Brush your teeth every morning and night with fluoride toothpaste. Floss one time each day. Exercise for at least 30 minutes 5 or more days each week. Do not use any products that contain nicotine or tobacco. These products include cigarettes, chewing tobacco, and vaping devices, such as e-cigarettes. If you need help quitting, ask your health care provider. Do not use drugs. If you are sexually active, practice safe sex. Use a condom or other form of protection to   prevent STIs. If you do not wish to become pregnant, use a form of birth control. If you plan to become pregnant, see your health care provider for a  prepregnancy visit. Take aspirin only as told by your health care provider. Make sure that you understand how much to take and what form to take. Work with your health care provider to find out whether it is safe and beneficial for you to take aspirin daily. Find healthy ways to manage stress, such as: Meditation, yoga, or listening to music. Journaling. Talking to a trusted person. Spending time with friends and family. Minimize exposure to UV radiation to reduce your risk of skin cancer. Safety Always wear your seat belt while driving or riding in a vehicle. Do not drive: If you have been drinking alcohol. Do not ride with someone who has been drinking. When you are tired or distracted. While texting. If you have been using any mind-altering substances or drugs. Wear a helmet and other protective equipment during sports activities. If you have firearms in your house, make sure you follow all gun safety procedures. Seek help if you have been physically or sexually abused. What's next? Visit your health care provider once a year for an annual wellness visit. Ask your health care provider how often you should have your eyes and teeth checked. Stay up to date on all vaccines. This information is not intended to replace advice given to you by your health care provider. Make sure you discuss any questions you have with your health care provider. Document Revised: 10/10/2020 Document Reviewed: 10/10/2020 Elsevier Patient Education  2024 Else[employer] Inc. Breast Self-Awareness Breast self-awareness is knowing how your breasts look and feel. You need to: Check your breasts on a regular basis. Tell your doctor about any changes. Become familiar with the look and feel of your breasts. This can help you catch a breast problem while it is still small and can be treated. You should do breast self-exams even if you have breast implants. What you need: A mirror. A well-lit room. A pillow or other  soft object. How to do a breast self-exam Follow these steps to do a breast self-exam: Look for changes  Take off all the clothes above your waist. Stand in front of a mirror in a room with good lighting. Put your hands down at your sides. Compare your breasts in the mirror. Look for any difference between them, such as: A difference in shape. A difference in size. Wrinkles, dips, and bumps in one breast and not the other. Look at each breast for changes in the skin, such as: Redness. Scaly areas. Skin that has gotten thicker. Dimpling. Open sores (ulcers). Look for changes in your nipples, such as: Fluid coming out of a nipple. Fluid around a nipple. Bleeding. Dimpling. Redness. A nipple that looks pushed in (retracted), or that has changed position. Feel for changes Lie on your back. Feel each breast. To do this: Pick a breast to feel. Place a pillow under the shoulder closest to that breast. Put the arm closest to that breast behind your head. Feel the nipple area of that breast using the hand of your other arm. Feel the area with the pads of your three middle fingers by making small circles with your fingers. Use light, medium, and firm pressure. Continue the overlapping circles, moving downward over the breast. Keep making circles with your fingers. Stop when you feel your ribs. Start making circles with your fingers again, this time going   upward until you reach your collarbone. Then, make circles outward across your breast and into your armpit area. Squeeze your nipple. Check for discharge and lumps. Repeat these steps to check your other breast. Sit or stand in the tub or shower. With soapy water on your skin, feel each breast the same way you did when you were lying down. Write down what you find Writing down what you find can help you remember what to tell your doctor. Write down: What is normal for each breast. Any changes you find in each breast. These  include: The kind of changes you find. A tender or painful breast. Any lump you find. Write down its size and where it is. When you last had your monthly period (menstrual cycle). General tips If you are breastfeeding, the best time to check your breasts is after you feed your baby or after you use a breast pump. If you get monthly bleeding, the best time to check your breasts is 5-7 days after your monthly cycle ends. With time, you will become comfortable with the self-exam. You will also start to know if there are changes in your breasts. Contact a doctor if: You see a change in the shape or size of your breasts or nipples. You see a change in the skin of your breast or nipples, such as red or scaly skin. You have fluid coming from your nipples that is not normal. You find a new lump or thick area. You have breast pain. You have any concerns about your breast health. Summary Breast self-awareness includes looking for changes in your breasts and feeling for changes within your breasts. You should do breast self-awareness in front of a mirror in a well-lit room. If you get monthly periods (menstrual cycles), the best time to check your breasts is 5-7 days after your period ends. Tell your doctor about any changes you see in your breasts. Changes include changes in size, changes on the skin, painful or tender breasts, or fluid from your nipples that is not normal. This information is not intended to replace advice given to you by your health care provider. Make sure you discuss any questions you have with your health care provider. Document Revised: 09/19/2021 Document Reviewed: 02/14/2021 Elsevier Patient Education  2024 Elsevier Inc.  

## 2023-03-20 ENCOUNTER — Encounter: Payer: Self-pay | Admitting: Obstetrics

## 2023-03-31 ENCOUNTER — Encounter: Payer: Self-pay | Admitting: Obstetrics and Gynecology

## 2023-04-04 ENCOUNTER — Ambulatory Visit (HOSPITAL_COMMUNITY)
Admission: EM | Admit: 2023-04-04 | Discharge: 2023-04-04 | Disposition: A | Discharge status: Home / Self Care | Payer: BC Managed Care – PPO | Attending: Internal Medicine | Admitting: Internal Medicine

## 2023-04-04 ENCOUNTER — Encounter (HOSPITAL_COMMUNITY): Payer: Self-pay | Admitting: Emergency Medicine

## 2023-04-04 DIAGNOSIS — J069 Acute upper respiratory infection, unspecified: Secondary | ICD-10-CM | POA: Diagnosis not present

## 2023-04-04 DIAGNOSIS — M5442 Lumbago with sciatica, left side: Secondary | ICD-10-CM

## 2023-04-04 DIAGNOSIS — N390 Urinary tract infection, site not specified: Secondary | ICD-10-CM

## 2023-04-04 LAB — POCT URINALYSIS DIP (MANUAL ENTRY)
Bilirubin, UA: NEGATIVE
Glucose, UA: NEGATIVE mg/dL
Ketones, POC UA: NEGATIVE mg/dL
Leukocytes, UA: NEGATIVE
Nitrite, UA: NEGATIVE
Protein Ur, POC: 30 mg/dL — AB
Spec Grav, UA: >=1.03 — AB (ref 1.010–1.025)
Urobilinogen, UA: 0.2 U/dL
pH, UA: 6 (ref 5.0–8.0)

## 2023-04-04 LAB — POC COVID19/FLU A&B COMBO
Covid Antigen, POC: NEGATIVE
Influenza A Antigen, POC: NEGATIVE
Influenza B Antigen, POC: NEGATIVE

## 2023-04-04 MED ORDER — DEXAMETHASONE SODIUM PHOSPHATE 10 MG/ML IJ SOLN
INTRAMUSCULAR | Status: AC
  Filled 2023-04-04: qty 1

## 2023-04-04 MED ORDER — PREDNISONE 10 MG PO TABS: 40.0000 mg | ORAL_TABLET | Freq: Every day | ORAL | 0 refills | Status: AC

## 2023-04-04 MED ORDER — KETOROLAC TROMETHAMINE 30 MG/ML IJ SOLN
30.0000 mg | Freq: Once | INTRAMUSCULAR | Status: AC
  Administered 2023-04-04: 30 mg via INTRAMUSCULAR

## 2023-04-04 MED ORDER — AMOXICILLIN 500 MG PO CAPS: 500.0000 mg | ORAL_CAPSULE | Freq: Two times a day (BID) | ORAL | 0 refills | Status: AC

## 2023-04-04 MED ORDER — KETOROLAC TROMETHAMINE 30 MG/ML IJ SOLN
INTRAMUSCULAR | Status: AC
  Filled 2023-04-04: qty 1

## 2023-04-04 MED ORDER — CYCLOBENZAPRINE HCL 10 MG PO TABS: 10.0000 mg | ORAL_TABLET | Freq: Two times a day (BID) | ORAL | 0 refills | Status: DC | PRN

## 2023-04-04 MED ORDER — DEXAMETHASONE SODIUM PHOSPHATE 10 MG/ML IJ SOLN
10.0000 mg | Freq: Once | INTRAMUSCULAR | Status: AC
  Administered 2023-04-04: 10 mg via INTRAMUSCULAR

## 2023-04-04 NOTE — ED Triage Notes (Signed)
Pt c/o chills, body aches, headache, back pain, dizziness, and nausea that started today

## 2023-04-04 NOTE — ED Provider Notes (Addendum)
[facility]    CSN: [id number] Arrival date & time: 04/04/23  1117      History   Chief Complaint Chief Complaint  Patient presents with   Generalized Body Aches   Back Pain   Chills    HPI Shelby Obrien is a 32 y.o. female.   32 year old female who presents to urgent care with complaints of generalized body aches, headaches, chills, nausea. The back pain started yesterday with the body aches. The back pain is across the whole lower back. Also have right hand tingling and dropping things at time due to pain. Using inhaler more frequently for a few days due to a slight increase in shortness of breath. Using symbicort at night as prescribed. Has not taking anything for body aches.  The back pain is across the lower back and is severe at times with spasms.  She denies urinary frequency, urgency, hematuria, dysuria or difficulty urinating.  She denies fevers, chest pain, abdominal pain, vomiting, diarrhea, constipation   Back Pain Associated symptoms: headaches   Associated symptoms: no abdominal pain, no chest pain, no dysuria and no fever     Past Medical History:  Diagnosis Date   Anemia affecting pregnancy in third trimester    Asthma    BRCA negative 02/2023   MyRisk neg   Family history of breast cancer    Increased risk of breast cancer 02/2023   IBIS=22%/riskscore=26%   Maternal morbid obesity, antepartum (HCC) 02/26/2017   Maternal obesity affecting pregnancy, antepartum 02/26/2017   Migraines    Obesity    BMI>40   Rapid palpitations 03/01/2021   Tachycardia, unspecified    Vaginal delivery 08/05/2021    Patient Active Problem List   Diagnosis Date Noted   Pelvic pain 03/02/2023   IUD (intrauterine device) in place 03/02/2023   Morbid obesity with body mass index (BMI) of 40.0 or higher (HCC) 03/02/2023   Postpartum hypertension 09/08/2021   Symptomatic anemia 07/05/2021   Eczema 06/01/2019   Anxiety and depression 09/04/2017    Migraines 10/07/2016   Asthma 10/07/2016    Past Surgical History:  Procedure Laterality Date   WISDOM TOOTH EXTRACTION      OB History     Gravida  4   Para  3   Term  2   Preterm  1   AB  1   Living  3      SAB  1   IAB      Ectopic      Multiple  0   Live Births  3            Home Medications    Prior to Admission medications   Medication Sig Start Date End Date Taking? Authorizing Provider  albuterol (VENTOLIN HFA) 108 (90 Base) MCG/ACT inhaler Inhale 2 puffs into the lungs every 6 (six) hours as needed for wheezing or shortness of breath. 09/08/22   Raspet, Noberto Retort, PA-C  budesonide-formoterol (SYMBICORT) 80-4.5 MCG/ACT inhaler Inhale 2 puffs into the lungs in the morning and at bedtime. 09/08/22   Raspet, Noberto Retort, PA-C  carisoprodol (SOMA) 350 MG tablet Take 1 tablet (350 mg total) by mouth 3 (three) times daily. 12/24/22   Crain, Whitney L, PA  ketoconazole (NIZORAL) 2 % cream Apply 1 Application topically 2 (two) times daily as needed for irritation. 01/20/23   Nelwyn Salisbury, MD  levonorgestrel (MIRENA) 20 MCG/DAY IUD 1 each by Intrauterine route once.    [provider]  meloxicam (MOBIC) 15 MG tablet Take 1 tablet (15 mg total) by mouth daily. 03/09/23   Brenton Grills, MD  NIFEdipine (PROCARDIA XL/NIFEDICAL XL) 60 MG 24 hr tablet Take 1 tablet (60 mg total) by mouth daily. 01/20/23   Nelwyn Salisbury, MD  phentermine 37.5 MG capsule Take 1 capsule (37.5 mg total) by mouth every morning. 10/17/22   Nelwyn Salisbury, MD  sertraline (ZOLOFT) 50 MG tablet TAKE 1 TABLET(50 MG) BY MOUTH DAILY 02/16/23   Nelwyn Salisbury, MD    Family History Family History  Problem Relation Age of Onset   Cervical cancer Mother    Diabetes Sister    Diabetes Maternal Grandmother    Heart disease Maternal Grandmother    Colon cancer Maternal Grandfather 105   Diabetes Paternal Grandmother    Heart disease Paternal Grandmother    Pancreatic cancer Paternal Grandmother 64    Uterine cancer Paternal Grandmother 52   Diabetes Paternal Uncle    Breast cancer Maternal Aunt 45   Breast cancer Paternal Aunt 76    Social History Social History   Tobacco Use   Smoking status: Former    Current packs/day: 0.00    Types: Cigarettes    Quit date: 01/02/2021    Years since quitting: 2.2   Smokeless tobacco: Never  Vaping Use   Vaping status: Never Used  Substance Use Topics   Alcohol use: No   Drug use: No     Allergies   Iodine, Shellfish allergy, Banana, Blueberry flavoring agent (non-screening), Other, and Raspberry   Review of Systems Review of Systems  Constitutional:  Positive for appetite change, chills and fatigue. Negative for fever.  HENT:  Positive for congestion. Negative for ear pain and sore throat.   Eyes:  Negative for pain and visual disturbance.  Respiratory:  Negative for cough and shortness of breath.   Cardiovascular:  Negative for chest pain and palpitations.  Gastrointestinal:  Negative for abdominal pain and vomiting.  Genitourinary:  Negative for dysuria and hematuria.  Musculoskeletal:  Positive for back pain. Negative for arthralgias.       Body aches  Skin:  Negative for color change and rash.  Neurological:  Positive for dizziness and headaches. Negative for seizures and syncope.  All other systems reviewed and are negative.    Physical Exam Triage Vital Signs ED Triage Vitals  Encounter Vitals Group     BP 04/04/23 1152 139/89     Systolic BP Percentile --      Diastolic BP Percentile --      Pulse Rate 04/04/23 1152 68     Resp 04/04/23 1152 17     Temp 04/04/23 1152 97.9 F (36.6 C)     Temp Source 04/04/23 1152 Oral     SpO2 04/04/23 1152 96 %     Weight --      Height --      Head Circumference --      Peak Flow --      Pain Score 04/04/23 1154 10     Pain Loc --      Pain Education --      Exclude from Growth Chart --    No data found.  Updated Vital Signs BP 139/89 (BP Location: Left Arm)    Pulse 68   Temp 97.9 F (36.6 C) (Oral)   Resp 17   LMP 03/10/2023 (Approximate)   SpO2 96%   Visual Acuity Right Eye Distance:   Left Eye Distance:  Bilateral Distance:    Right Eye Near:   Left Eye Near:    Bilateral Near:     Physical Exam Vitals and nursing note reviewed.  Constitutional:      General: She is not in acute distress.    Appearance: She is well-developed.  HENT:     Head: Normocephalic and atraumatic.     Right Ear: Tympanic membrane normal.     Left Ear: Tympanic membrane normal.     Nose: Congestion present.     Mouth/Throat:     Mouth: Mucous membranes are moist.  Eyes:     Extraocular Movements: Extraocular movements intact.     Conjunctiva/sclera: Conjunctivae normal.  Cardiovascular:     Rate and Rhythm: Normal rate and regular rhythm.     Heart sounds: No murmur heard. Pulmonary:     Effort: Pulmonary effort is normal. No respiratory distress.     Breath sounds: Normal breath sounds.  Abdominal:     Palpations: Abdomen is soft.     Tenderness: There is no abdominal tenderness.  Musculoskeletal:        General: No swelling.     Cervical back: Neck supple.       Back:     Comments: No CVA tenderness  Skin:    General: Skin is warm and dry.     Capillary Refill: Capillary refill takes less than 2 seconds.  Neurological:     Mental Status: She is alert.  Psychiatric:        Mood and Affect: Mood normal.      UC Treatments / Results  Labs (all labs ordered are listed, but only abnormal results are displayed) Labs Reviewed - No data to display  EKG   Radiology No results found.  Procedures Procedures (including critical care time)  Medications Ordered in UC Medications - No data to display  Initial Impression / Assessment and Plan / UC Course  I have reviewed the triage vital signs and the nursing notes.  Pertinent labs & imaging results that were available during my care of the patient were reviewed by me and considered  in my medical decision making (see chart for details).     Acute bilateral low back pain with left-sided sciatica  Lower urinary tract infectious disease  Viral upper respiratory tract infection    Flu, COVID were both negative.  Urinalysis showed large amount of blood and protein in your urine which may indicate a urinary tract infection especially given the get lower back pain.  Also has symptoms consistent with a upper respiratory infection.  Will treat for with the following: Toradol injection for pain and decadron (steroid) injection for inflammation given today Amoxicillin 500mg  twice daily for 7 days Prednisone 40mg  daily for 5 days. Take this in the morning starting tomorrow Flexeril 10mg  every 12 hours as needed for muscle spasms, use caution as these can make you sleepy Rest and stay hydrated Return to urgent care or PCP if symptoms worsen or fail to resolve.    Final Clinical Impressions(s) / UC Diagnoses   Final diagnoses:  None   Discharge Instructions   None    ED Prescriptions   None    PDMP not reviewed this encounter.   Landis Martins, PA-C 04/04/23 1329    Landis Martins, [place] 04/04/23 1332

## 2023-04-04 NOTE — Discharge Instructions (Addendum)
Flu, COVID were both negative.  Urinalysis showed large amount of blood and protein in your urine.  Also has symptoms consistent with a upper respiratory infection.  Will treat for with the following: Toradol injection for pain and decadron (steroid) injection for inflammation given today Amoxicillin 500mg  twice daily for 7 days Prednisone 40mg  daily for 5 days. Take this in the morning starting tomorrow Flexeril 10mg  every 12 hours as needed for muscle spasms, use caution as these can make you sleepy Rest and stay hydrated Return to urgent care or PCP if symptoms worsen or fail to resolve.

## 2023-04-27 ENCOUNTER — Ambulatory Visit: Payer: BC Managed Care – PPO | Admitting: Family Medicine

## 2023-04-27 ENCOUNTER — Encounter: Payer: Self-pay | Admitting: Family Medicine

## 2023-04-27 NOTE — Telephone Encounter (Signed)
Contacted pt. Appt is rescheduled.

## 2023-05-04 ENCOUNTER — Ambulatory Visit (INDEPENDENT_AMBULATORY_CARE_PROVIDER_SITE_OTHER): Payer: BC Managed Care – PPO | Admitting: Family Medicine

## 2023-05-04 ENCOUNTER — Encounter: Payer: Self-pay | Admitting: Family Medicine

## 2023-05-04 VITALS — BP 118/70 | HR 74 | Temp 98.0°F | Wt 299.0 lb

## 2023-05-04 DIAGNOSIS — H60502 Unspecified acute noninfective otitis externa, left ear: Secondary | ICD-10-CM | POA: Diagnosis not present

## 2023-05-04 MED ORDER — CIPROFLOXACIN-DEXAMETHASONE 0.3-0.1 % OT SUSP: 4.0000 [drp] | Freq: Two times a day (BID) | OTIC | 0 refills | Status: DC

## 2023-05-04 NOTE — Progress Notes (Signed)
   Subjective:    Patient ID: Shelby Obrien, female    DOB: 22-Feb-1991, 33 y.o.   MRN: [id number]  HPI Here for one week of mild pain in the left ear. No bleeding. She admits to using Qtips to clean her ear canals.    Review of Systems  Constitutional: Negative.   HENT:  Positive for ear pain. Negative for congestion, hearing loss, postnasal drip, sinus pain and sore throat.   Eyes: Negative.   Respiratory: Negative.         Objective:   Physical Exam Constitutional:      General: She is not in acute distress.    Appearance: Normal appearance.  HENT:     Right Ear: Tympanic membrane, ear canal and external ear normal.     Left Ear: Tympanic membrane normal.     Ears:     Comments: Left ear canal is red     Mouth/Throat:     Pharynx: Oropharynx is clear.  Eyes:     Conjunctiva/sclera: Conjunctivae normal.  Pulmonary:     Effort: Pulmonary effort is normal.     Breath sounds: Normal breath sounds.  Lymphadenopathy:     Cervical: No cervical adenopathy.  Neurological:     Mental Status: She is alert.           Assessment & Plan:  Otitis externa, likely from trauma. We will treat with several days of Ciprodex  drops. I advised her to avoid putting anything in her ears.  Garnette Olmsted, MD

## 2023-06-18 ENCOUNTER — Ambulatory Visit: Payer: Self-pay | Admitting: Family Medicine

## 2023-06-18 NOTE — Telephone Encounter (Signed)
  Chief Complaint: HA Symptoms: fever, chills, HA Frequency: since this morning Pertinent Negatives: Patient denies SOB, neck stiffness, CMS changes, vision changes, speech issues, numbness/tingling  Disposition: [] ED /[] Urgent Care (no appt availability in office) / [] Appointment(In office/virtual)/ []  [place] [facility] [facility]/ [x] Home Care/ [x] Refused Recommended Disposition /[] [facility]/ []  Follow-up with PCP  Additional Notes: Pt states hx of migraines and this feels the same. States that she is out of the medication that she normally takes for this. Pt denies stroke s/s. Pt states that she has not tested for covid/flu.  Pt advised to seek ED treatment if she develops CMS changes, vision or speech changes, numbness/tingling.   Copied from CRM [id number]. Topic: Appointments - Pink Word Triage >> Jun 18, 2023  2:00 PM Suzette B wrote: Patient is stating she is having severe migraines, fever, chill, body aches. Reason for Disposition  Similar to previously diagnosed migraine headaches  Answer Assessment - Initial Assessment Questions 1. LOCATION: "Where does it hurt?"      Front, right above the eyes 2. ONSET: "When did the headache start?" (Minutes, hours or days)      All day, since this morning 3. PATTERN: "Does the pain come and go, or has it been constant since it started?"     constant 4. SEVERITY: "How bad is the pain?" and "What does it keep you from doing?"  (e.g., Scale 1-10; mild, moderate, or severe)   - MILD (1-3): doesn't interfere with normal activities    - MODERATE (4-7): interferes with normal activities or awakens from sleep    - SEVERE (8-10): excruciating pain, unable to do any normal activities        5 5. RECURRENT SYMPTOM: "Have you ever had headaches before?" If Yes, ask: "When was the last time?" and "What happened that time?"      Yes, migraine, out of normal medication that I take for it 6. CAUSE: "What do you think is causing the headache?"      migraine 7. MIGRAINE: "Have you been diagnosed with migraine headaches?" If Yes, ask: "Is this headache similar?"      Hx of migraines 8. HEAD INJURY: "Has there been any recent injury to the head?"      denies 9. OTHER SYMPTOMS: "Do you have any other symptoms?" (fever, stiff neck, eye pain, sore throat, cold symptoms)     Fever,  10. PREGNANCY: "Is there any chance you are pregnant?" "When was your last menstrual period?"       denies  Protocols used: Headache-A-AH

## 2023-06-19 ENCOUNTER — Encounter: Payer: Self-pay | Admitting: Family Medicine

## 2023-06-19 ENCOUNTER — Ambulatory Visit (INDEPENDENT_AMBULATORY_CARE_PROVIDER_SITE_OTHER): Payer: BC Managed Care – PPO | Admitting: Family Medicine

## 2023-06-19 VITALS — BP 122/88 | HR 70 | Temp 98.6°F | Resp 16 | Ht 64.0 in | Wt 304.4 lb

## 2023-06-19 DIAGNOSIS — R519 Headache, unspecified: Secondary | ICD-10-CM

## 2023-06-19 DIAGNOSIS — K529 Noninfective gastroenteritis and colitis, unspecified: Secondary | ICD-10-CM

## 2023-06-19 DIAGNOSIS — J029 Acute pharyngitis, unspecified: Secondary | ICD-10-CM | POA: Diagnosis not present

## 2023-06-19 DIAGNOSIS — R110 Nausea: Secondary | ICD-10-CM

## 2023-06-19 LAB — POCT INFLUENZA A/B
Influenza A, POC: NEGATIVE
Influenza B, POC: NEGATIVE

## 2023-06-19 LAB — POCT RAPID STREP A (OFFICE): Rapid Strep A Screen: NEGATIVE

## 2023-06-19 MED ORDER — ONDANSETRON HCL 4 MG PO TABS: 4.0000 mg | ORAL_TABLET | Freq: Three times a day (TID) | ORAL | 0 refills | Status: AC | PRN

## 2023-06-19 NOTE — Patient Instructions (Addendum)
 A few things to remember from today's visit:  Sore throat - Plan: POC Rapid Strep A  Gastroenteritis, acute - Plan: POC Influenza A/B  Nausea without vomiting - Plan: ondansetron (ZOFRAN) 4 MG tablet  Headache, unspecified headache type Monitor for new symptoms. Tylenol 500 mg 3-4 times per day as needed. Adequate hydration with clear fluids. Monitor blood pressure.  Do not use My Chart to request refills or for acute issues that need immediate attention. If you send a my chart message, it may take a few days to be addressed, specially if I am not in the office.  Please be sure medication list is accurate. If a new problem present, please set up appointment sooner than planned today.

## 2023-06-19 NOTE — Progress Notes (Signed)
 ACUTE VISIT Chief Complaint  Patient presents with   Headache   Abdominal Pain    Pt reports diarrhea yesterday. Sx started yesterda. Took Nyquil last night.   Nausea   Body Ache   Sore Throat   Fatigue    `1   HPI: Shelby Obrien is a 33 y.o. female with a PMHx significant for postpartum HTN, migraines, asthma, eczema, anemia, anxiety, and depression, among some, who is here today complaining of several symptoms as described above.   She complains of sore throat, frontal headache, periumbilical abdominal pain, nausea, body aches, and diarrhea  since yesterday. She has also had rhinorrhea this morning.  She says she had 6 stools yesterday and 2 so far this morning.   Headache  This is a new problem. The current episode started yesterday. The problem has been unchanged. The pain is located in the Frontal region. The pain quality is similar to prior headaches. The pain is mild. Associated symptoms include abdominal pain, muscle aches, nausea, rhinorrhea, sinus pressure and a sore throat. Pertinent negatives include no abnormal behavior, blurred vision, coughing, drainage, ear pain, eye pain, eye redness, eye watering, facial sweating, fever, hearing loss, loss of balance, neck pain, numbness, phonophobia, photophobia, scalp tenderness, swollen glands, tingling, tinnitus, visual change, vomiting, weakness or weight loss. Nothing aggravates the symptoms. Her past medical history is significant for hypertension, migraine headaches and obesity.  Diarrhea  This is a new problem. The current episode started yesterday. The problem has been unchanged. The patient states that diarrhea does not awaken her from sleep. Associated symptoms include abdominal pain, chills, headaches, myalgias and a URI. Pertinent negatives include no arthralgias, bloating, coughing, fever, increased  flatus, sweats, vomiting or weight loss. Nothing aggravates the symptoms. Risk factors include ill contacts. She  has tried nothing for the symptoms.   She has been exposed to flu by her kids and fiancee. Her fiancee has also had diarrhea. She mentions they ate at [employer] a few days ago. She took nyquil yesterday.    Has not taken a home covid test.  Pertinent negatives include fever, chills, nasal congestion, visual changes, blood in her stool, or melena. No recent travel.   DBP mildly elevated. HTN: Currently on nifedipine 60 mg daily.   Review of Systems  Constitutional:  Positive for chills. Negative for fever and weight loss.  HENT:  Positive for rhinorrhea, sinus pressure and sore throat. Negative for ear pain, hearing loss and tinnitus.   Eyes:  Negative for blurred vision, photophobia, pain and redness.  Respiratory:  Negative for cough, shortness of breath and wheezing.   Cardiovascular:  Negative for chest pain and leg swelling.  Gastrointestinal:  Positive for abdominal pain, diarrhea and nausea. Negative for bloating, flatus and vomiting.  Genitourinary:  Negative for decreased urine volume, dysuria and hematuria.  Musculoskeletal:  Positive for myalgias. Negative for arthralgias and neck pain.  Skin:  Negative for rash.  Allergic/Immunologic: Positive for environmental allergies.  Neurological:  Positive for headaches. Negative for tingling, syncope, weakness, numbness and loss of balance.  Psychiatric/Behavioral:  Negative for confusion.   See other pertinent positives and negatives in HPI.  Current Outpatient Medications on File Prior to Visit  Medication Sig Dispense Refill   albuterol (VENTOLIN HFA) 108 (90 Base) MCG/ACT inhaler Inhale 2 puffs into the lungs every 6 (six) hours as needed for wheezing or shortness of breath. 8 g 1   budesonide-formoterol (SYMBICORT) 80-4.5 MCG/ACT inhaler Inhale 2 puffs into the lungs  in the morning and at bedtime. 1 each 1   carisoprodol (SOMA) 350 MG tablet Take 1 tablet (350 mg total) by mouth 3 (three) times daily. 30 tablet 0    ciprofloxacin-dexamethasone (CIPRODEX) OTIC suspension Place 4 drops into the left ear 2 (two) times daily. 7.5 mL 0   cyclobenzaprine (FLEXERIL) 10 MG tablet Take 1 tablet (10 mg total) by mouth 2 (two) times daily as needed for muscle spasms. 20 tablet 0   ketoconazole (NIZORAL) 2 % cream Apply 1 Application topically 2 (two) times daily as needed for irritation. 30 g 5   levonorgestrel (MIRENA) 20 MCG/DAY IUD 1 each by Intrauterine route once.     meloxicam (MOBIC) 15 MG tablet Take 1 tablet (15 mg total) by mouth daily. 30 tablet 0   NIFEdipine (PROCARDIA XL/NIFEDICAL XL) 60 MG 24 hr tablet Take 1 tablet (60 mg total) by mouth daily. 90 tablet 3   phentermine 37.5 MG capsule Take 1 capsule (37.5 mg total) by mouth every morning. 90 capsule 1   sertraline (ZOLOFT) 50 MG tablet TAKE 1 TABLET(50 MG) BY MOUTH DAILY 90 tablet 3   No current facility-administered medications on file prior to visit.    Past Medical History:  Diagnosis Date   Anemia affecting pregnancy in third trimester    Asthma    BRCA negative 02/2023   MyRisk neg   Family history of breast cancer    Increased risk of breast cancer 02/2023   IBIS=22%/riskscore=26%   Maternal morbid obesity, antepartum (HCC) 02/26/2017   Maternal obesity affecting pregnancy, antepartum 02/26/2017   Migraines    Obesity    BMI>40   Rapid palpitations 03/01/2021   Tachycardia, unspecified    Vaginal delivery 08/05/2021   Allergies  Allergen Reactions   Iodine Anaphylaxis    Reports swelling and had to get epipen after iodine exposure   Shellfish Allergy Anaphylaxis   Banana    Blueberry Flavoring Agent (Non-Screening)    Other     BLUEBERRIES, BANANAS, TREE NUTS, RASPBERRIES, GRAPES, POLLENS/SEASONAL.   Raspberry     Social History   Socioeconomic History   Marital status: Single    Spouse name: Not on file   Number of children: 2   Years of education: 16   Highest education level: Associate degree: academic program   Occupational History   Occupation: Multimedia programmer: [employer]  Tobacco Use   Smoking status: Former    Current packs/day: 0.00    Types: Cigarettes    Quit date: 01/02/2021    Years since quitting: 2.4   Smokeless tobacco: Never  Vaping Use   Vaping status: Never Used  Substance and Sexual Activity   Alcohol use: No   Drug use: No   Sexual activity: Not Currently    Birth control/protection: I.U.D.  Other Topics Concern   Not on file  Social History Narrative   Lives with boyfriend and 2 daughters in an apartment on the 2nd floor.  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  Education: college. She is right-handed. Caffeine - one glass every few weeks. She is active with her children and work. No smoking.    Social Drivers of Health   Financial Resource Strain: Medium Risk (04/26/2023)   Overall Financial Resource Strain (CARDIA)    Difficulty of Paying Living Expenses: Somewhat hard  Food Insecurity: No Food Insecurity (04/26/2023)   Hunger Vital Sign    Worried About Running Out of Food in the Last Year:  Never true    Ran Out of Food in the Last Year: Never true  Transportation Needs: No Transportation Needs (04/26/2023)   PRAPARE - Administrator, Civil Service (Medical): No    Lack of Transportation (Non-Medical): No  Physical Activity: Unknown (04/26/2023)   Exercise Vital Sign    Days of Exercise per Week: 0 days    Minutes of Exercise per Session: Not on file  Stress: No Stress Concern Present (04/26/2023)   [employer] of Occupational Health - Occupational Stress Questionnaire    Feeling of Stress : Only a little  Social Connections: Moderately Integrated (04/26/2023)   Social Connection and Isolation Panel [NHANES]    Frequency of Communication with Friends and Family: Twice a week    Frequency of Social Gatherings with Friends and Family: Once a week    Attends Religious Services: More than 4 times per year    Active Member of Clubs or  Organizations: No    Attends Banker Meetings: Not on file    Marital Status: Living with partner   Vitals:   06/19/23 0712  BP: 122/88  Pulse: 70  Resp: 16  Temp: 98.6 F (37 C)  SpO2: 99%   Body mass index is 52.25 kg/m.  Physical Exam Vitals and nursing note reviewed.  Constitutional:      General: She is not in acute distress.    Appearance: She is well-developed.  HENT:     Head: Normocephalic and atraumatic.     Right Ear: Tympanic membrane, ear canal and external ear normal.     Left Ear: External ear normal. Tympanic membrane is not erythematous.     Ears:     Comments: Excess cerumen in the left ear, TM seen partially.    Nose: No congestion.     Mouth/Throat:     Mouth: Mucous membranes are moist.     Pharynx: Oropharynx is clear. Uvula midline. Posterior oropharyngeal erythema (mild) and postnasal drip present. No oropharyngeal exudate or uvula swelling.  Eyes:     Conjunctiva/sclera: Conjunctivae normal.  Cardiovascular:     Rate and Rhythm: Normal rate and regular rhythm.     Heart sounds: No murmur heard. Pulmonary:     Effort: Pulmonary effort is normal. No respiratory distress.     Breath sounds: Normal breath sounds.  Abdominal:     Palpations: Abdomen is soft. There is no hepatomegaly or mass.     Tenderness: There is no abdominal tenderness.  Musculoskeletal:     Cervical back: Neck supple.     Right lower leg: No edema.     Left lower leg: No edema.  Lymphadenopathy:     Cervical: No cervical adenopathy.  Skin:    General: Skin is warm.     Findings: No erythema or rash.  Neurological:     General: No focal deficit present.     Mental Status: She is alert and oriented to person, place, and time.     Cranial Nerves: No cranial nerve deficit.     Gait: Gait normal.  Psychiatric:        Mood and Affect: Mood and affect normal.   ASSESSMENT AND PLAN:  Ms. Langlais was seen today for migraines.   Sore throat Tested negative  for strep throat and flu today in the office. Monitor for new symptoms. Could repeat COVID-19 test at home in 2 days if symptoms are persistent.  -     POCT rapid strep A  Gastroenteritis, acute Some respiratory symptoms as well. Most likely viral etiology, Norovirus to be considered.. Symptomatic treatment recommended for now. Other possible causes discussed but at this time I do not think further work-up is necessary today. Oral hydration, Gatorade or Pedialyte mixed with water are good options. Bland and light diet if tolerated. Clearly instructed about warning signs. F/U as needed.  -     POCT Influenza A/B  Nausea without vomiting Small sips of clear fluids throughout the day. Zofran 4 mg 3 times daily as needed recommended. Monitor for new symptoms.  -     Ondansetron HCl; Take 1 tablet (4 mg total) by mouth every 8 (eight) hours as needed for up to 3 days for nausea or vomiting.  Dispense: 9 tablet; Refill: 0  Headache, unspecified headache type Similar to her typical migraine but could also be caused by above problems. I do not think head imaging is needed at this time. Tylenol 500 mg 3-4 times per day as needed. Adequate hydration and rest. Instructed about warning signs.  Recommend monitoring BP at home. Currently on Procardia XL 60 mg daily. Continue following with PCP.  Return if symptoms worsen or fail to improve.  I, Rolla Etienne Wierda, acting as a scribe for Ardell Aaronson [place], MD., have documented all relevant documentation on the behalf of Shelby Fowle [place], MD, as directed by  Shelby Mcneece [place], MD while in the presence of Shelby Trembath [place], MD.   I, Shelby Parrella [place], MD, have reviewed all documentation for this visit. The documentation on 06/19/23 for the exam, diagnosis, procedures, and orders are all accurate and complete.  Shelby Zappone G. [place], MD  [facility]. [place] office.

## 2023-07-21 ENCOUNTER — Encounter: Payer: Self-pay | Admitting: Family Medicine

## 2023-07-21 ENCOUNTER — Other Ambulatory Visit: Payer: Self-pay

## 2023-07-21 DIAGNOSIS — J4521 Mild intermittent asthma with (acute) exacerbation: Secondary | ICD-10-CM

## 2023-07-21 MED ORDER — BUDESONIDE-FORMOTEROL FUMARATE 80-4.5 MCG/ACT IN AERO: 2.0000 | INHALATION_SPRAY | Freq: Two times a day (BID) | RESPIRATORY_TRACT | 1 refills | Status: DC

## 2023-07-21 MED ORDER — ALBUTEROL SULFATE HFA 108 (90 BASE) MCG/ACT IN AERS: 2.0000 | INHALATION_SPRAY | Freq: Four times a day (QID) | RESPIRATORY_TRACT | 1 refills | Status: DC | PRN

## 2023-11-09 ENCOUNTER — Ambulatory Visit: Admitting: Family Medicine

## 2023-11-27 ENCOUNTER — Encounter: Payer: Self-pay | Admitting: Adult Health

## 2023-11-27 ENCOUNTER — Ambulatory Visit: Payer: Self-pay

## 2023-11-27 ENCOUNTER — Ambulatory Visit (INDEPENDENT_AMBULATORY_CARE_PROVIDER_SITE_OTHER): Payer: Self-pay | Admitting: Adult Health

## 2023-11-27 ENCOUNTER — Encounter: Payer: Self-pay | Admitting: Internal Medicine

## 2023-11-27 VITALS — BP 120/100 | HR 91 | Temp 98.6°F | Ht 64.0 in | Wt 314.0 lb

## 2023-11-27 DIAGNOSIS — N6312 Unspecified lump in the right breast, upper inner quadrant: Secondary | ICD-10-CM

## 2023-11-27 DIAGNOSIS — N61 Mastitis without abscess: Secondary | ICD-10-CM

## 2023-11-27 MED ORDER — DOXYCYCLINE HYCLATE 100 MG PO CAPS: 100.0000 mg | ORAL_CAPSULE | Freq: Two times a day (BID) | ORAL | 0 refills | Status: DC

## 2023-11-27 NOTE — Telephone Encounter (Signed)
 Appt was scheduled today with Shelby Obrien at 3pm.

## 2023-11-27 NOTE — Telephone Encounter (Signed)
 FYI Only or Action Required?: FYI only for provider.  Patient was last seen in primary care on 06/19/2023 by [place], Betty G, MD.  Called Nurse Triage reporting Abrasion.  Symptoms began about a month ago.  Interventions attempted: Nothing.  Symptoms are: unchanged.  Triage Disposition: See Physician Within 24 Hours  Patient/caregiver understands and will follow disposition?: Yes    Copied from CRM [id number]. Topic: Clinical - Red Word Triage >> Nov 27, 2023  8:28 AM Shelby Obrien wrote: Red Word that prompted transfer to Nurse Triage: Pain, Scar  Pt stated that she has a scar under her right breast and its very painful. Pt stated that it almost looks like a burn mark but not sure where it came from. Pt stated that this is the 3rd mark that has appeared under her breast. Reason for Disposition  [1] Looks infected (e.g., spreading redness, pus) AND [2] no fever  Answer Assessment - Initial Assessment Questions 1. APPEARANCE What does the injury look like?      States looks like a burn, with fluid over top of it, red, open 2. ONSET: How long ago did the injury occur?      States this is the third one she has had.  This one started a couple months ago 3. LOCATION: Where is the injury located?      Under right breast;  4. SIZE: How large is the cut?      States like a burn 5. BLEEDING: Is it bleeding now? If Yes, ask: Is it difficult to stop?      no 6. PAIN: Is there any pain? If Yes, ask: How bad is the pain? (Scale 0-10; or none, mild, moderate, severe)     mild 7. MECHANISM: Tell me how it happened.      unknown 8. TETANUS: When was your last tetanus booster?     na 9. PREGNANCY: Is there any chance you are pregnant? When was your last menstrual period?     na  Protocols used: Skin Injury-A-AH

## 2023-11-27 NOTE — Progress Notes (Signed)
 Subjective:    Patient ID: Shelby Obrien, female    DOB: [birth year], 33 y.o.   MRN: [id number]  HPI 33 year old female who  has a past medical history of Anemia affecting pregnancy in third trimester, Asthma, BRCA negative (02/2023), Family history of breast cancer, Increased risk of breast cancer (02/2023), Maternal morbid obesity, antepartum (HCC) (02/26/2017), Maternal obesity affecting pregnancy, antepartum (02/26/2017), Migraines, Obesity, Rapid palpitations (03/01/2021), Tachycardia, unspecified, and Vaginal delivery (08/05/2021).  She presents to the office today for an acute issue.  She reports about a day ago she noticed redness to her right breast that look like a burn.  This area blistered and popped and she had some clear drainage from the blister.  She does not remember burning herself.  The area around the blister and the blister itself is painful to touch.   Review of Systems See HPI   Past Medical History:  Diagnosis Date   Anemia affecting pregnancy in third trimester    Asthma    BRCA negative 02/2023   MyRisk neg   Family history of breast cancer    Increased risk of breast cancer 02/2023   IBIS=22%/riskscore=26%   Maternal morbid obesity, antepartum (HCC) 02/26/2017   Maternal obesity affecting pregnancy, antepartum 02/26/2017   Migraines    Obesity    BMI>40   Rapid palpitations 03/01/2021   Tachycardia, unspecified    Vaginal delivery 08/05/2021    Social History   Socioeconomic History   Marital status: Single    Spouse name: Not on file   Number of children: 2   Years of education: 16   Highest education level: Associate degree: academic program  Occupational History   Occupation: Multimedia programmer: [employer]  Tobacco Use   Smoking status: Former    Current packs/day: 0.00    Types: Cigarettes    Quit date: 01/02/2021    Years since quitting: 2.9   Smokeless tobacco: Never  Vaping Use   Vaping status: Never Used  Substance and  Sexual Activity   Alcohol use: No   Drug use: No   Sexual activity: Not Currently    Birth control/protection: I.U.D.  Other Topics Concern   Not on file  Social History Narrative   Lives with boyfriend and 2 daughters in an apartment on the 2nd floor.  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  Education: college. She is right-handed. Caffeine - one glass every few weeks. She is active with her children and work. No smoking.    Social Drivers of Health   Financial Resource Strain: Medium Risk (04/26/2023)   Overall Financial Resource Strain (CARDIA)    Difficulty of Paying Living Expenses: Somewhat hard  Food Insecurity: No Food Insecurity (04/26/2023)   Hunger Vital Sign    Worried About Running Out of Food in the Last Year: Never true    Ran Out of Food in the Last Year: Never true  Transportation Needs: No Transportation Needs (04/26/2023)   PRAPARE - Administrator, Civil Service (Medical): No    Lack of Transportation (Non-Medical): No  Physical Activity: Unknown (04/26/2023)   Exercise Vital Sign    Days of Exercise per Week: 0 days    Minutes of Exercise per Session: Not on file  Stress: No Stress Concern Present (04/26/2023)   [employer] of Occupational Health - Occupational Stress Questionnaire    Feeling of Stress : Only a little  Social Connections: Moderately Integrated (04/26/2023)  Social Advertising account executive    Frequency of Communication with Friends and Family: Twice a week    Frequency of Social Gatherings with Friends and Family: Once a week    Attends Religious Services: More than 4 times per year    Active Member of [employer] or Organizations: No    Attends Engineer, structural: Not on file    Marital Status: Living with partner  Intimate Partner Violence: Not on file    Past Surgical History:  Procedure Laterality Date   WISDOM TOOTH EXTRACTION      Family History  Problem Relation Age of Onset   Cervical cancer Mother     Diabetes Sister    Diabetes Maternal Grandmother    Heart disease Maternal Grandmother    Colon cancer Maternal Grandfather 76   Diabetes Paternal Grandmother    Heart disease Paternal Grandmother    Pancreatic cancer Paternal Grandmother 76   Uterine cancer Paternal Grandmother 48   Diabetes Paternal Uncle    Breast cancer Maternal Aunt 45   Breast cancer Paternal Aunt 50    Allergies  Allergen Reactions   Iodine Anaphylaxis    Reports swelling and had to get epipen  after iodine exposure   Shellfish Allergy Anaphylaxis   Banana    Blueberry Flavoring Agent (Non-Screening)    Other     BLUEBERRIES, BANANAS, TREE NUTS, RASPBERRIES, GRAPES, POLLENS/SEASONAL.   Raspberry     Current Outpatient Medications on File Prior to Visit  Medication Sig Dispense Refill   albuterol  (VENTOLIN  HFA) 108 (90 Base) MCG/ACT inhaler Inhale 2 puffs into the lungs every 6 (six) hours as needed for wheezing or shortness of breath. 8 g 1   budesonide -formoterol  (SYMBICORT ) 80-4.5 MCG/ACT inhaler Inhale 2 puffs into the lungs in the morning and at bedtime. 1 each 1   ciprofloxacin -dexamethasone  (CIPRODEX ) OTIC suspension Place 4 drops into the left ear 2 (two) times daily. 7.5 mL 0   levonorgestrel  (MIRENA ) 20 MCG/DAY IUD 1 each by Intrauterine route once.     NIFEdipine  (PROCARDIA  XL/NIFEDICAL XL) 60 MG 24 hr tablet Take 1 tablet (60 mg total) by mouth daily. 90 tablet 3   sertraline  (ZOLOFT ) 50 MG tablet TAKE 1 TABLET(50 MG) BY MOUTH DAILY 90 tablet 3   cyclobenzaprine  (FLEXERIL ) 10 MG tablet Take 1 tablet (10 mg total) by mouth 2 (two) times daily as needed for muscle spasms. 20 tablet 0   ketoconazole  (NIZORAL ) 2 % cream Apply 1 Application topically 2 (two) times daily as needed for irritation. 30 g 5   meloxicam  (MOBIC ) 15 MG tablet Take 1 tablet (15 mg total) by mouth daily. 30 tablet 0   No current facility-administered medications on file prior to visit.    BP (!) 120/100   Pulse 91   Temp  98.6 F (37 C) (Oral)   Ht 5' 4 (1.626 m)   Wt (!) 314 lb (142.4 kg)   SpO2 97%   BMI 53.90 kg/m        Objective:   Physical Exam Vitals and nursing note reviewed.  Constitutional:      Appearance: Normal appearance.  Chest:  Breasts:    Right: Swelling, mass, skin change and tenderness present. No inverted nipple or nipple discharge.    Musculoskeletal:        General: Normal range of motion.  Skin:    General: Skin is warm and dry.     Findings: Wound present.  Neurological:     General:  No focal deficit present.     Mental Status: She is alert and oriented to person, place, and time.  Psychiatric:        Mood and Affect: Mood normal.        Behavior: Behavior normal.        Thought Content: Thought content normal.        Judgment: Judgment normal.        Assessment & Plan:  1. Breast infection (Primary) - Possible abscess and will treat with doxycycline . Doxycyline will cover for infection in superficial wound as well. The wound appears as a burn  - doxycycline  (VIBRAMYCIN ) 100 MG capsule; Take 1 capsule (100 mg total) by mouth 2 (two) times daily.  Dispense: 14 capsule; Refill: 0  2. Mass of upper inner quadrant of right breast - Possible abscess but due to family history of breast cancer and wound will get Diagnostic mammogram  - MM 3D DIAGNOSTIC MAMMOGRAM UNILATERAL RIGHT BREAST; Future  Darleene Shape, NP  Time spent with patient today was 31 minutes which consisted of chart review, discussing wound and breast mass, work up, treatment answering questions and documentation.

## 2023-11-30 ENCOUNTER — Other Ambulatory Visit: Payer: Self-pay | Admitting: Adult Health

## 2023-11-30 DIAGNOSIS — N6312 Unspecified lump in the right breast, upper inner quadrant: Secondary | ICD-10-CM

## 2023-12-08 ENCOUNTER — Other Ambulatory Visit: Payer: Self-pay | Admitting: Adult Health

## 2023-12-08 ENCOUNTER — Ambulatory Visit
Admission: RE | Admit: 2023-12-08 | Discharge: 2023-12-08 | Disposition: A | Discharge status: Home / Self Care | Payer: Self-pay | Source: Ambulatory Visit | Attending: Adult Health

## 2023-12-08 ENCOUNTER — Ambulatory Visit
Admission: RE | Admit: 2023-12-08 | Discharge: 2023-12-08 | Disposition: A | Discharge status: Home / Self Care | Source: Ambulatory Visit | Attending: Adult Health

## 2023-12-08 ENCOUNTER — Encounter: Payer: Self-pay | Admitting: Internal Medicine

## 2023-12-08 DIAGNOSIS — N6320 Unspecified lump in the left breast, unspecified quadrant: Secondary | ICD-10-CM

## 2023-12-08 DIAGNOSIS — N6322 Unspecified lump in the left breast, upper inner quadrant: Secondary | ICD-10-CM | POA: Diagnosis not present

## 2023-12-08 DIAGNOSIS — R92313 Mammographic fatty tissue density, bilateral breasts: Secondary | ICD-10-CM | POA: Diagnosis not present

## 2023-12-08 DIAGNOSIS — N6312 Unspecified lump in the right breast, upper inner quadrant: Secondary | ICD-10-CM

## 2023-12-08 DIAGNOSIS — N6489 Other specified disorders of breast: Secondary | ICD-10-CM | POA: Diagnosis not present

## 2023-12-08 DIAGNOSIS — R928 Other abnormal and inconclusive findings on diagnostic imaging of breast: Secondary | ICD-10-CM | POA: Diagnosis not present

## 2023-12-11 ENCOUNTER — Ambulatory Visit (INDEPENDENT_AMBULATORY_CARE_PROVIDER_SITE_OTHER): Admitting: Family Medicine

## 2023-12-11 ENCOUNTER — Encounter: Payer: Self-pay | Admitting: Family Medicine

## 2023-12-11 VITALS — BP 138/80 | HR 82 | Temp 98.1°F | Wt 315.4 lb

## 2023-12-11 DIAGNOSIS — N611 Abscess of the breast and nipple: Secondary | ICD-10-CM | POA: Diagnosis not present

## 2023-12-11 MED ORDER — SILVER SULFADIAZINE 1 % EX CREA: 1.0000 | TOPICAL_CREAM | Freq: Two times a day (BID) | CUTANEOUS | 1 refills | Status: AC

## 2023-12-11 NOTE — Progress Notes (Signed)
   Subjective:    Patient ID: Shelby Obrien, female    DOB: November 13, 1990, 33 y.o.   MRN: [id number]  HPI Here to follow up on a wound on the right breast. She was seen here on 11-27-23 for an open area of skin that was tender on the right breast. She was given 7 days of Doxycycline, and she has been dressing it once a day with Neosporin and a Bandaid. She says it has not been healing as she would have expected, and it now has a foul odor. Of note she was sent for a diagnostic mammogram and US  which she had on 12-08-23. The right breast showed the wound area, and this was felt to be benign infection. However they did see a 0.4 cm lump in the left breast. She was recommended to have a 6 month follow up study for this.    Review of Systems  Constitutional: Negative.   Respiratory: Negative.    Cardiovascular: Negative.   Skin:  Positive for wound.       Objective:   Physical Exam Constitutional:      Appearance: She is not ill-appearing.  Cardiovascular:     Rate and Rhythm: Normal rate and regular rhythm.     Pulses: Normal pulses.     Heart sounds: Normal heart sounds.  Pulmonary:     Effort: Pulmonary effort is normal.     Breath sounds: Normal breath sounds.  Skin:    Comments: There is a 3 cm diameter area of skin on the underside of the right breast that is ulcerated and macerated. There is a foul odor. There is a  fine rim of granulation tissue around the margin. There is an eschar in the center.   Neurological:     Mental Status: She is alert.           Assessment & Plan:  Breast abscess. The eschar was removed today with forceps and a scalpel. She will dress the wound with Silvadene cream BID until this heals up. It will likely take 3-4 weeks for this to heal. Recheck as needed.  Garnette Olmsted, MD

## 2023-12-14 ENCOUNTER — Ambulatory Visit: Admitting: Family Medicine

## 2024-01-13 ENCOUNTER — Ambulatory Visit: Admitting: Family Medicine

## 2024-01-13 ENCOUNTER — Ambulatory Visit: Payer: Self-pay

## 2024-01-13 ENCOUNTER — Encounter: Payer: Self-pay | Admitting: Family Medicine

## 2024-01-13 VITALS — BP 132/84 | HR 65 | Temp 98.4°F | Ht 64.0 in | Wt 318.6 lb

## 2024-01-13 DIAGNOSIS — R059 Cough, unspecified: Secondary | ICD-10-CM

## 2024-01-13 DIAGNOSIS — J4521 Mild intermittent asthma with (acute) exacerbation: Secondary | ICD-10-CM

## 2024-01-13 DIAGNOSIS — R52 Pain, unspecified: Secondary | ICD-10-CM | POA: Diagnosis not present

## 2024-01-13 DIAGNOSIS — H66001 Acute suppurative otitis media without spontaneous rupture of ear drum, right ear: Secondary | ICD-10-CM | POA: Diagnosis not present

## 2024-01-13 LAB — POCT INFLUENZA A/B
Influenza A, POC: NEGATIVE
Influenza B, POC: NEGATIVE

## 2024-01-13 LAB — POC COVID19 BINAXNOW: SARS Coronavirus 2 Ag: NEGATIVE

## 2024-01-13 MED ORDER — ALBUTEROL SULFATE HFA 108 (90 BASE) MCG/ACT IN AERS: 2.0000 | INHALATION_SPRAY | Freq: Four times a day (QID) | RESPIRATORY_TRACT | 1 refills | Status: DC | PRN

## 2024-01-13 MED ORDER — AMOXICILLIN 875 MG PO TABS: 875.0000 mg | ORAL_TABLET | Freq: Two times a day (BID) | ORAL | 0 refills | Status: AC

## 2024-01-13 MED ORDER — BUDESONIDE-FORMOTEROL FUMARATE 80-4.5 MCG/ACT IN AERO: 2.0000 | INHALATION_SPRAY | Freq: Two times a day (BID) | RESPIRATORY_TRACT | 1 refills | Status: DC

## 2024-01-13 MED ORDER — METHYLPREDNISOLONE 4 MG PO TBPK: ORAL_TABLET | ORAL | 0 refills | Status: DC

## 2024-01-13 NOTE — Progress Notes (Signed)
 Acute Office Visit  Subjective:     Patient ID: Shelby Obrien, female    DOB: 1990-12-11, 33 y.o.   MRN: [id number]  Chief Complaint  Patient presents with   Chills    Noted since last night   Generalized Body Aches    Noted since last night   Eye Problem    Patient complains of seeing light-like pen needles hitting each other this morning when she woke up   Cough    Non-productive since last night   Shortness of Breath    HPI Discussed the use of AI scribe software for clinical note transcription with the patient, who gave verbal consent to proceed.  History of Present Illness   Shelby Obrien is a 33 year old female with asthma and hypertension who presents with fever, chills, and body aches.  She has been experiencing fever, chills, and body aches since last night. She has been in contact with a sick coworker and attends school, which increases her exposure to others. She also has a cough and shortness of breath, but no chest pain.  This morning, after sneezing, she experienced visual disturbances described as 'moving lights' in both eyes, which have since improved, though she occasionally still sees 'floogies'.  She has a history of asthma and uses inhalers at home. She recently finished her last doses of Symbicort  and albuterol  and requires refills. She also has hypertension and takes blood pressure medication.  During the review of symptoms, she reports nasal congestion and difficulty breathing through her nose. She is not allergic to any antibiotics but has some food allergies noted.       Review of Systems  All other systems reviewed and are negative.       Objective:    BP 132/84   Pulse 65   Temp 98.4 F (36.9 C) (Oral)   Ht 5' 4 (1.626 m)   Wt (!) 318 lb 9.6 oz (144.5 kg)   SpO2 100%   BMI 54.69 kg/m    Physical Exam Vitals reviewed.  Constitutional:      Appearance: She is well-developed. She is obese.  HENT:     Right  Ear: Tympanic membrane is erythematous.     Left Ear: Hearing normal.     Nose: Congestion present.     Right Sinus: No maxillary sinus tenderness.     Left Sinus: No maxillary sinus tenderness.     Mouth/Throat:     Pharynx: No oropharyngeal exudate or posterior oropharyngeal erythema.  Eyes:     Conjunctiva/sclera: Conjunctivae normal.  Cardiovascular:     Rate and Rhythm: Normal rate and regular rhythm.     Heart sounds: Normal heart sounds. No murmur heard. Pulmonary:     Effort: Pulmonary effort is normal.     Breath sounds: Wheezing present. No rhonchi or rales.  Lymphadenopathy:     Cervical: No cervical adenopathy.  Neurological:     Mental Status: She is alert.     Results for orders placed or performed in visit on 01/13/24  POC COVID-19  Result Value Ref Range   SARS Coronavirus 2 Ag Negative Negative  POC Influenza A/B  Result Value Ref Range   Influenza A, POC Negative Negative   Influenza B, POC Negative Negative        Assessment & Plan:   Problem List Items Addressed This Visit       Unprioritized   Asthma   Relevant Medications   budesonide -formoterol  (SYMBICORT ) 80-4.5  MCG/ACT inhaler   albuterol  (VENTOLIN  HFA) 108 (90 Base) MCG/ACT inhaler   methylPREDNISolone  (MEDROL  DOSEPAK) 4 MG TBPK tablet   Other Visit Diagnoses       Cough, unspecified type    -  Primary   Relevant Orders   POC COVID-19 (Completed)   POC Influenza A/B (Completed)     Generalized body aches       Relevant Orders   POC COVID-19 (Completed)   POC Influenza A/B (Completed)     Non-recurrent acute suppurative otitis media of right ear without spontaneous rupture of tympanic membrane       Relevant Medications   amoxicillin  (AMOXIL ) 875 MG tablet     Ear infection noted on the right, flu and covid are negative, most likeyl a viral URI with ear infection complication, send in amoxicillin .  Meds ordered this encounter  Medications   budesonide -formoterol  (SYMBICORT )  80-4.5 MCG/ACT inhaler    Sig: Inhale 2 puffs into the lungs in the morning and at bedtime.    Dispense:  1 each    Refill:  1   albuterol  (VENTOLIN  HFA) 108 (90 Base) MCG/ACT inhaler    Sig: Inhale 2 puffs into the lungs every 6 (six) hours as needed for wheezing or shortness of breath.    Dispense:  8 g    Refill:  1   amoxicillin  (AMOXIL ) 875 MG tablet    Sig: Take 1 tablet (875 mg total) by mouth 2 (two) times daily for 10 days.    Dispense:  20 tablet    Refill:  0   methylPREDNISolone  (MEDROL  DOSEPAK) 4 MG TBPK tablet    Sig: Take package as directed.    Dispense:  21 each    Refill:  0    No follow-ups on file.  Heron CHRISTELLA Sharper, MD

## 2024-01-13 NOTE — Telephone Encounter (Signed)
 FYI Only or Action Required?: FYI only for provider.  Patient was last seen in primary care on 12/11/2023 by Shelby Obrien LABOR, MD.  Called Nurse Triage reporting Vomiting, Wheezing, Headache, Generalized Body Aches, and Nasal Congestion.  Symptoms began yesterday.  Interventions attempted: Prescription medications: albuterol  inhaler and Rest, hydration, or home remedies.  Symptoms are: gradually worsening.  Triage Disposition: See HCP Within 4 Hours (Or PCP Triage)  Patient/caregiver understands and will follow disposition?: Yes     Reason for Disposition  [1] MILD difficulty breathing (e.g., minimal/no SOB at rest, SOB with walking, pulse < 100) AND [2] NEW-onset or WORSE than normal  Answer Assessment - Initial Assessment Questions Advised exam in next 4 hours, scheduled with PCP office for this am, advised call back if worsening or new symptoms.    1. RESPIRATORY STATUS: Describe your breathing? (e.g., wheezing, shortness of breath, unable to speak, severe coughing)      Had to use inhaler last night for wheezing, felt SOB and was coughing real bad, not coughing anything up, inhaler did help, not wheezing or SOB so far today Can't breathe out of nose 2. ONSET: When did this breathing problem begin?      Last night 3. PATTERN Does the difficult breathing come and go, or has it been constant since it started?      Comes and goes 6. CARDIAC HISTORY: Do you have any history of heart disease? (e.g., heart attack, angina, bypass surgery, angioplasty)      No hx blood clot in legs or lungs 7. LUNG HISTORY: Do you have any history of lung disease?  (e.g., pulmonary embolus, asthma, emphysema)     asthma 9. OTHER SYMPTOMS: Do you have any other symptoms? (e.g., chest pain, cough, dizziness, fever, runny nose)     No chest pain, dizziness Feeling weak but can stand up and stuff No fever, chills felt cold but not shaking Vomiting no blood or mucus just vomited x1 was  coughing a lot right before 6/10 pain headache and body aches Nasal congestion 11. PREGNANCY: Is there any chance you are pregnant? When was your last menstrual period?       no  Protocols used: Breathing Difficulty-A-AH

## 2024-01-13 NOTE — Telephone Encounter (Signed)
 Noted

## 2024-01-13 NOTE — Telephone Encounter (Addendum)
 Copied from CRM #[id number]. Topic: Clinical - Red Word Triage >> Jan 13, 2024  8:02 AM Mia F wrote: Red Word that prompted transfer to Nurse Triage: Vomting, wheezing, headaches, bodyaches, congestion. No sore throat, chest discomfort, or sob. All started lastnight.

## 2024-01-13 NOTE — Telephone Encounter (Deleted)
 Copied from CRM #[id number]. Topic: Clinical - Red Word Triage >> Jan 13, 2024  8:02 AM Mia F wrote: Red Word that prompted transfer to Nurse Triage: Vomting, wheezing, headaches, bodyaches, congestion. No sore throat, chest discomfort, or sob. All started lastnight.

## 2024-01-25 ENCOUNTER — Encounter: Payer: Self-pay | Admitting: Internal Medicine

## 2024-01-26 ENCOUNTER — Ambulatory Visit: Payer: Self-pay | Admitting: Family Medicine

## 2024-01-26 ENCOUNTER — Encounter: Payer: Self-pay | Admitting: Family Medicine

## 2024-01-26 ENCOUNTER — Ambulatory Visit (INDEPENDENT_AMBULATORY_CARE_PROVIDER_SITE_OTHER): Payer: Self-pay | Admitting: Family Medicine

## 2024-01-26 VITALS — BP 110/80 | HR 73 | Temp 98.8°F | Ht 64.0 in | Wt 315.0 lb

## 2024-01-26 DIAGNOSIS — Z0000 Encounter for general adult medical examination without abnormal findings: Secondary | ICD-10-CM

## 2024-01-26 DIAGNOSIS — Z23 Encounter for immunization: Secondary | ICD-10-CM

## 2024-01-26 DIAGNOSIS — J4521 Mild intermittent asthma with (acute) exacerbation: Secondary | ICD-10-CM

## 2024-01-26 LAB — BASIC METABOLIC PANEL WITH GFR
BUN: 14 mg/dL (ref 6–23)
CO2: 25 meq/L (ref 19–32)
Calcium: 9.1 mg/dL (ref 8.4–10.5)
Chloride: 107 meq/L (ref 96–112)
Creatinine, Ser: 0.75 mg/dL (ref 0.40–1.20)
GFR: 103.67 mL/min (ref >60.00)
Glucose, Bld: 100 mg/dL — ABNORMAL HIGH (ref 70–99)
Potassium: 4.1 meq/L (ref 3.5–5.1)
Sodium: 138 meq/L (ref 135–145)

## 2024-01-26 LAB — HEPATIC FUNCTION PANEL
ALT: 10 U/L (ref 0–35)
AST: 14 U/L (ref 0–37)
Albumin: 4 g/dL (ref 3.5–5.2)
Alkaline Phosphatase: 98 U/L (ref 39–117)
Bilirubin, Direct: 0.1 mg/dL (ref 0.0–0.3)
Total Bilirubin: 0.4 mg/dL (ref 0.2–1.2)
Total Protein: 6.8 g/dL (ref 6.0–8.3)

## 2024-01-26 LAB — LIPID PANEL
Cholesterol: 160 mg/dL (ref 0–200)
HDL: 41.5 mg/dL (ref >39.00)
LDL Cholesterol: 106 mg/dL — ABNORMAL HIGH (ref 0–99)
NonHDL: 118.31
Total CHOL/HDL Ratio: 4
Triglycerides: 62 mg/dL (ref 0.0–149.0)
VLDL: 12.4 mg/dL (ref 0.0–40.0)

## 2024-01-26 LAB — CBC WITH DIFFERENTIAL/PLATELET
Basophils Absolute: 0 K/uL (ref 0.0–0.1)
Basophils Relative: 0.4 % (ref 0.0–3.0)
Eosinophils Absolute: 0.1 K/uL (ref 0.0–0.7)
Eosinophils Relative: 2.2 % (ref 0.0–5.0)
HCT: 35.9 % — ABNORMAL LOW (ref 36.0–46.0)
Hemoglobin: 12.1 g/dL (ref 12.0–15.0)
Lymphocytes Relative: 20.9 % (ref 12.0–46.0)
Lymphs Abs: 1.3 K/uL (ref 0.7–4.0)
MCHC: 33.8 g/dL (ref 30.0–36.0)
MCV: 85.4 fl (ref 78.0–100.0)
Monocytes Absolute: 0.3 K/uL (ref 0.1–1.0)
Monocytes Relative: 4.5 % (ref 3.0–12.0)
Neutro Abs: 4.6 K/uL (ref 1.4–7.7)
Neutrophils Relative %: 72 % (ref 43.0–77.0)
Platelets: 239 K/uL (ref 150.0–400.0)
RBC: 4.2 Mil/uL (ref 3.87–5.11)
RDW: 15.5 % (ref 11.5–15.5)
WBC: 6.4 K/uL (ref 4.0–10.5)

## 2024-01-26 LAB — TSH: TSH: 1.52 u[IU]/mL (ref 0.35–5.50)

## 2024-01-26 LAB — HEMOGLOBIN A1C: Hgb A1c MFr Bld: 5.9 % (ref 4.6–6.5)

## 2024-01-26 MED ORDER — NIFEDIPINE ER OSMOTIC RELEASE 60 MG PO TB24: 60.0000 mg | ORAL_TABLET | Freq: Every day | ORAL | 3 refills | Status: AC

## 2024-01-26 MED ORDER — SERTRALINE HCL 50 MG PO TABS: ORAL_TABLET | ORAL | 3 refills | Status: AC

## 2024-01-26 MED ORDER — BUDESONIDE-FORMOTEROL FUMARATE 80-4.5 MCG/ACT IN AERO: 2.0000 | INHALATION_SPRAY | Freq: Two times a day (BID) | RESPIRATORY_TRACT | 3 refills | Status: DC

## 2024-01-26 NOTE — Addendum Note (Signed)
 Addended by: LADONNA INOCENTE SAILOR on: 01/26/2024 08:46 AM   Modules accepted: Orders

## 2024-01-26 NOTE — Progress Notes (Signed)
   Subjective:    Patient ID: Shelby Obrien, female    DOB: 1990/05/25, 33 y.o.   MRN: [id number]  HPI Here for a well exam. She feels well. She is struggling to keep her weight under control, and she recently began walking with her fiance 2 days a week. 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   Review of Systems  Constitutional: Negative.   HENT: Negative.    Eyes: Negative.   Respiratory: Negative.    Cardiovascular: Negative.   Gastrointestinal: Negative.   Genitourinary:  Negative for decreased urine volume, difficulty urinating, dyspareunia, dysuria, enuresis, flank pain, frequency, hematuria, pelvic pain and urgency.  Musculoskeletal: Negative.   Skin: Negative.   Neurological: Negative.  Negative for headaches.  Psychiatric/Behavioral: Negative.         Objective:   Physical Exam Constitutional:      General: She is not in acute distress.    Appearance: She is well-developed. She is obese.  HENT:     Head: Normocephalic and atraumatic.     Right Ear: External ear normal.     Left Ear: External ear normal.     Nose: Nose normal.     Mouth/Throat:     Pharynx: No oropharyngeal exudate.  Eyes:     General: No scleral icterus.    Conjunctiva/sclera: Conjunctivae normal.     Pupils: Pupils are equal, round, and reactive to light.  Neck:     Thyroid : No thyromegaly.     Vascular: No JVD.  Cardiovascular:     Rate and Rhythm: Normal rate and regular rhythm.     Pulses: Normal pulses.     Heart sounds: Normal heart sounds. No murmur heard.    No friction rub. No gallop.  Pulmonary:     Effort: Pulmonary effort is normal. No respiratory distress.     Breath sounds: Normal breath sounds. No wheezing or rales.  Chest:     Chest wall: No tenderness.  Abdominal:     General: Bowel sounds are normal. There is no distension.     Palpations: Abdomen is soft. There is no mass.     Tenderness: There is no abdominal tenderness. There is no  guarding or rebound.  Musculoskeletal:        General: No tenderness. Normal range of motion.     Cervical back: Normal range of motion and neck supple.  Lymphadenopathy:     Cervical: No cervical adenopathy.  Skin:    General: Skin is warm and dry.     Findings: No erythema or rash.  Neurological:     General: No focal deficit present.     Mental Status: She is alert and oriented to person, place, and time.     Cranial Nerves: No cranial nerve deficit.     Motor: No abnormal muscle tone.     Coordination: Coordination normal.     Deep Tendon Reflexes: Reflexes are normal and symmetric. Reflexes normal.  Psychiatric:        Mood and Affect: Mood normal.        Behavior: Behavior normal.        Thought Content: Thought content normal.        Judgment: Judgment normal.           Assessment & Plan:  Well exam. We discussed diet and exercise. Get fasting labs. Garnette Olmsted, MD

## 2024-03-14 ENCOUNTER — Encounter: Payer: Self-pay | Admitting: Family Medicine

## 2024-03-26 ENCOUNTER — Other Ambulatory Visit: Payer: Self-pay

## 2024-03-26 ENCOUNTER — Emergency Department (HOSPITAL_COMMUNITY): Payer: Self-pay

## 2024-03-26 ENCOUNTER — Emergency Department (HOSPITAL_COMMUNITY)
Admission: EM | Admit: 2024-03-26 | Discharge: 2024-03-26 | Disposition: A | Discharge status: Home / Self Care | Payer: Self-pay | Attending: Emergency Medicine | Admitting: Emergency Medicine

## 2024-03-26 DIAGNOSIS — J4521 Mild intermittent asthma with (acute) exacerbation: Secondary | ICD-10-CM | POA: Insufficient documentation

## 2024-03-26 DIAGNOSIS — I10 Essential (primary) hypertension: Secondary | ICD-10-CM | POA: Insufficient documentation

## 2024-03-26 DIAGNOSIS — B349 Viral infection, unspecified: Secondary | ICD-10-CM | POA: Insufficient documentation

## 2024-03-26 DIAGNOSIS — Z7951 Long term (current) use of inhaled steroids: Secondary | ICD-10-CM | POA: Insufficient documentation

## 2024-03-26 LAB — RESP PANEL BY RT-PCR (RSV, FLU A&B, COVID)  RVPGX2
Influenza A by PCR: NEGATIVE
Influenza B by PCR: NEGATIVE
Resp Syncytial Virus by PCR: NEGATIVE
SARS Coronavirus 2 by RT PCR: NEGATIVE

## 2024-03-26 MED ORDER — BUDESONIDE-FORMOTEROL FUMARATE 80-4.5 MCG/ACT IN AERO: 2.0000 | INHALATION_SPRAY | Freq: Two times a day (BID) | RESPIRATORY_TRACT | 3 refills | Status: AC

## 2024-03-26 MED ORDER — ALBUTEROL SULFATE HFA 108 (90 BASE) MCG/ACT IN AERS: 2.0000 | INHALATION_SPRAY | Freq: Four times a day (QID) | RESPIRATORY_TRACT | 1 refills | Status: AC | PRN

## 2024-03-26 MED ORDER — IPRATROPIUM-ALBUTEROL 0.5-2.5 (3) MG/3ML IN SOLN
3.0000 mL | Freq: Once | RESPIRATORY_TRACT | Status: AC
  Administered 2024-03-26: 3 mL via RESPIRATORY_TRACT
  Filled 2024-03-26: qty 3

## 2024-03-26 MED ORDER — PREDNISONE 10 MG PO TABS: 40.0000 mg | ORAL_TABLET | Freq: Every day | ORAL | 0 refills | Status: AC

## 2024-03-26 MED ORDER — PREDNISONE 20 MG PO TABS
60.0000 mg | ORAL_TABLET | Freq: Once | ORAL | Status: AC
  Administered 2024-03-26: 60 mg via ORAL
  Filled 2024-03-26: qty 3

## 2024-03-26 MED ORDER — BENZONATATE 100 MG PO CAPS: 100.0000 mg | ORAL_CAPSULE | Freq: Three times a day (TID) | ORAL | 0 refills | Status: AC

## 2024-03-26 MED ORDER — IPRATROPIUM-ALBUTEROL 0.5-2.5 (3) MG/3ML IN SOLN
3.0000 mL | Freq: Once | RESPIRATORY_TRACT | Status: AC
Start: 2024-03-26 — End: 2024-03-26
  Administered 2024-03-26: 3 mL via RESPIRATORY_TRACT
  Filled 2024-03-26: qty 3

## 2024-03-26 MED ORDER — ACETAMINOPHEN 325 MG PO TABS
650.0000 mg | ORAL_TABLET | Freq: Once | ORAL | Status: AC
  Administered 2024-03-26: 650 mg via ORAL
  Filled 2024-03-26: qty 2

## 2024-03-26 NOTE — ED Triage Notes (Signed)
 C/o generalized bodyaches onset Thurs pm with nasal congestion and cough

## 2024-03-26 NOTE — ED Provider Notes (Signed)
 [place] EMERGENCY DEPARTMENT AT [facility] Provider Note   CSN: [id number] Arrival date & time: 03/26/24  9347     Patient presents with: Generalized Body Aches   Shelby Obrien is a 33 y.o. female.   Patient is a 33 year old female with a past medical history of asthma and hypertension presenting to the emergency department with cough and congestion.  Patient reports Thursday night she started to develop congestion and throat Friday has had cough and bodyaches.  She states that she is chest pain only when she coughs.  She states she is mildly short of breath and had needed to use her inhaler more frequently and has now run out of her inhaler.  She states that she felt warm last night but has not measured a fever at home.  She states that her son is sick with similar symptoms.  The history is provided by the patient.       Prior to Admission medications   Medication Sig Start Date End Date Taking? Authorizing Provider  benzonatate  (TESSALON ) 100 MG capsule Take 1 capsule (100 mg total) by mouth every 8 (eight) hours. 03/26/24  Yes Ellouise, Saman Umstead K, DO  predniSONE  (DELTASONE ) 10 MG tablet Take 4 tablets (40 mg total) by mouth daily. 03/26/24  Yes Ellouise, Edwyna Dangerfield K, DO  albuterol  (VENTOLIN  HFA) 108 (90 Base) MCG/ACT inhaler Inhale 2 puffs into the lungs every 6 (six) hours as needed for wheezing or shortness of breath. 03/26/24   Kingsley, Kaelah Hayashi K, DO  budesonide -formoterol  (SYMBICORT ) 80-4.5 MCG/ACT inhaler Inhale 2 puffs into the lungs in the morning and at bedtime. 03/26/24   Kingsley, Lavaeh Bau K, DO  levonorgestrel  (MIRENA ) 20 MCG/DAY IUD 1 each by Intrauterine route once.    [provider]  NIFEdipine  (PROCARDIA  XL/NIFEDICAL XL) 60 MG 24 hr tablet Take 1 tablet (60 mg total) by mouth daily. 01/26/24   Johnny Garnette LABOR, MD  sertraline  (ZOLOFT ) 50 MG tablet TAKE 1 TABLET(50 MG) BY MOUTH DAILY 01/26/24   Johnny Garnette LABOR, MD  silver  sulfADIAZINE   (SILVADENE ) 1 % cream Apply 1 Application topically 2 (two) times daily. 12/11/23   Johnny Garnette LABOR, MD    Allergies: Iodine, Shellfish allergy, Banana, Blueberry flavoring agent (non-screening), Other, and Raspberry    Review of Systems  Updated Vital Signs BP 121/81 (BP Location: Right Arm)   Pulse 78   Temp 98.3 F (36.8 C) (Oral)   Resp 20   Ht 5' 4 (1.626 m)   Wt (!) 142.9 kg   SpO2 98%   BMI 54.07 kg/m   Physical Exam Vitals and nursing note reviewed.  Constitutional:      General: She is not in acute distress.    Appearance: Normal appearance. She is obese.  HENT:     Head: Normocephalic.     Nose: Congestion present.     Mouth/Throat:     Mouth: Mucous membranes are moist.     Pharynx: Oropharynx is clear.  Eyes:     Extraocular Movements: Extraocular movements intact.     Conjunctiva/sclera: Conjunctivae normal.  Cardiovascular:     Rate and Rhythm: Normal rate and regular rhythm.     Heart sounds: Normal heart sounds.  Pulmonary:     Effort: Pulmonary effort is normal.     Breath sounds: Wheezing (Diffuse end expiratory) present.  Abdominal:     General: Abdomen is flat.     Palpations: Abdomen is soft.  Musculoskeletal:  General: Normal range of motion.     Cervical back: Normal range of motion.  Skin:    General: Skin is warm and dry.  Neurological:     Mental Status: She is alert and oriented to person, place, and time.  Psychiatric:        Mood and Affect: Mood normal.        Behavior: Behavior normal.     (all labs ordered are listed, but only abnormal results are displayed) Labs Reviewed  RESP PANEL BY RT-PCR (RSV, FLU A&B, COVID)  [id number]    EKG: None  Radiology: DG Chest 2 View Result Date: 03/26/2024 EXAM: PA AND LATERAL (2) VIEW(S) XRAY OF THE CHEST 03/26/2024 07:53:00 AM COMPARISON: PA AND LATERAL (2) VIEW(S) XRAY OF THE CHEST DATED 08/03/2022. CLINICAL HISTORY: cough, SOB FINDINGS: LUNGS AND PLEURA: No focal pulmonary  opacity. No pleural effusion. No pneumothorax. HEART AND MEDIASTINUM: No acute abnormality of the cardiac and mediastinal silhouettes. BONES AND SOFT TISSUES: No acute osseous abnormality. IMPRESSION: 1. No acute process. Electronically signed by: Evalene Coho MD 03/26/2024 07:57 AM EST RP Workstation: [id number]     Procedures   Medications Ordered in the ED  ipratropium-albuterol  (DUONEB) 0.5-2.5 (3) MG/3ML nebulizer solution 3 mL (has no administration in time range)  predniSONE  (DELTASONE ) tablet 60 mg (60 mg Oral Given 03/26/24 0801)  ipratropium-albuterol  (DUONEB) 0.5-2.5 (3) MG/3ML nebulizer solution 3 mL (3 mLs Nebulization Given 03/26/24 0801)  acetaminophen  (TYLENOL ) tablet 650 mg (650 mg Oral Given 03/26/24 0801)    Clinical Course as of 03/26/24 0953  Sat Mar 26, 2024  0810 CXR without acute disease.  [VK]  [id number] Upon reassessment, the patient reported mild improvement of her shortness of breath.  Does still have some end expiratory wheeze.  Will be given additional DuoNeb.  She is otherwise will be stable for discharge home with outpatient follow-up.  Will be given refills of her inhalers and course of steroids.  Was given strict return precaution. [VK]    Clinical Course User Index [VK] Kingsley, Peachie Barkalow K, DO                                 Medical Decision Making This patient presents to the ED with chief complaint(s) of cough, congestion with pertinent past medical history of asthma, hypertension which further complicates the presenting complaint. The complaint involves an extensive differential diagnosis and also carries with it a high risk of complications and morbidity.    The differential diagnosis includes viral syndrome, pneumonia, pneumothorax, pulmonary edema, pleural effusion, asthma exacerbation, chest pain only when she coughs with wheezing on exam making an ACS unlikely  Additional history obtained: Additional history obtained from N/A Records reviewed  Primary Care Documents  ED Course and Reassessment: On patient's arrival she is hemodynamically stable in no acute distress.  She is congested appearing and has trace end expiratory wheeze.  She will be swabbed for COVID, flu and RSV.  She will be given steroids and DuoNeb as well as a chest x-ray and she will be closely reassessed.  Independent labs interpretation:  The following labs were independently interpreted: viral swab negative  Independent visualization of imaging: - I independently visualized the following imaging with scope of interpretation limited to determining acute life threatening conditions related to emergency care: CXR, which revealed no acute disease  Consultation: - Consulted or discussed management/test interpretation w/ external professional: N/A  Consideration for admission or  further workup: Patient has no emergent conditions requiring admission or further work-up at this time and is stable for discharge home with primary care follow-up  Social Determinants of health: N/A    Amount and/or Complexity of Data Reviewed Radiology: ordered.  Risk OTC drugs. Prescription drug management.       Final diagnoses:  Viral illness  Mild intermittent asthma with exacerbation    ED Discharge Orders          Ordered    predniSONE  (DELTASONE ) 10 MG tablet  Daily        03/26/24 0951    albuterol  (VENTOLIN  HFA) 108 (90 Base) MCG/ACT inhaler  Every 6 hours PRN        03/26/24 0951    budesonide -formoterol  (SYMBICORT ) 80-4.5 MCG/ACT inhaler  2 times daily        03/26/24 0951    benzonatate  (TESSALON ) 100 MG capsule  Every 8 hours        03/26/24 0951               Kingsley, Deklin Bieler K, DO 03/26/24 [phone]

## 2024-03-26 NOTE — Discharge Instructions (Signed)
 In the emergency department for your cough and congestion.  You tested negative for COVID, flu and RSV and likely have another viral infection.  You did have wheezing consistent with an asthma exacerbation.  I have given you a course of steroids and should complete this as prescribed.  I recommend using your albuterol  inhaler every 4 hours for the next 24 hours and then you can return to using it as needed.  You can follow-up with your primary doctor to have your symptoms rechecked.  You should return to the emergency department for significantly worsening shortness of breath, severe chest pain, fevers or any other new or concerning symptoms.

## 2024-06-13 ENCOUNTER — Encounter
